# Patient Record
Sex: Female | Born: 1970
Health system: Southern US, Community
[De-identification: ages and names within clinical notes are randomized; demographics above are authoritative.]

## PROBLEM LIST (undated history)

## (undated) DIAGNOSIS — D126 Benign neoplasm of colon, unspecified: Secondary | ICD-10-CM

## (undated) DIAGNOSIS — B977 Papillomavirus as the cause of diseases classified elsewhere: Secondary | ICD-10-CM

## (undated) DIAGNOSIS — Z87442 Personal history of urinary calculi: Secondary | ICD-10-CM

## (undated) DIAGNOSIS — K589 Irritable bowel syndrome without diarrhea: Secondary | ICD-10-CM

## (undated) DIAGNOSIS — F419 Anxiety disorder, unspecified: Secondary | ICD-10-CM

## (undated) DIAGNOSIS — R7303 Prediabetes: Secondary | ICD-10-CM

## (undated) DIAGNOSIS — K219 Gastro-esophageal reflux disease without esophagitis: Secondary | ICD-10-CM

## (undated) DIAGNOSIS — Z91018 Allergy to other foods: Secondary | ICD-10-CM

## (undated) DIAGNOSIS — G4700 Insomnia, unspecified: Secondary | ICD-10-CM

## (undated) DIAGNOSIS — F32A Depression, unspecified: Secondary | ICD-10-CM

## (undated) DIAGNOSIS — E785 Hyperlipidemia, unspecified: Secondary | ICD-10-CM

## (undated) DIAGNOSIS — T7840XA Allergy, unspecified, initial encounter: Secondary | ICD-10-CM

## (undated) DIAGNOSIS — E669 Obesity, unspecified: Secondary | ICD-10-CM

## (undated) DIAGNOSIS — B379 Candidiasis, unspecified: Secondary | ICD-10-CM

## (undated) DIAGNOSIS — F329 Major depressive disorder, single episode, unspecified: Secondary | ICD-10-CM

## (undated) DIAGNOSIS — R609 Edema, unspecified: Secondary | ICD-10-CM

## (undated) DIAGNOSIS — Z8742 Personal history of other diseases of the female genital tract: Secondary | ICD-10-CM

## (undated) HISTORY — DX: Hyperlipidemia, unspecified: E78.5

## (undated) HISTORY — DX: Irritable bowel syndrome, unspecified: K58.9

## (undated) HISTORY — DX: Personal history of urinary calculi: Z87.442

## (undated) HISTORY — DX: Anxiety disorder, unspecified: F41.9

## (undated) HISTORY — DX: Papillomavirus as the cause of diseases classified elsewhere: B97.7

## (undated) HISTORY — DX: Prediabetes: R73.03

## (undated) HISTORY — DX: Candidiasis, unspecified: B37.9

## (undated) HISTORY — DX: Insomnia, unspecified: G47.00

## (undated) HISTORY — DX: Depression, unspecified: F32.A

## (undated) HISTORY — DX: Benign neoplasm of colon, unspecified: D12.6

## (undated) HISTORY — DX: Allergy, unspecified, initial encounter: T78.40XA

## (undated) HISTORY — DX: Gastro-esophageal reflux disease without esophagitis: K21.9

## (undated) HISTORY — DX: Obesity, unspecified: E66.9

## (undated) HISTORY — DX: Personal history of other diseases of the female genital tract: Z87.42

## (undated) HISTORY — DX: Major depressive disorder, single episode, unspecified: F32.9

---

## 1998-02-12 ENCOUNTER — Emergency Department (HOSPITAL_COMMUNITY): Admission: EM | Admit: 1998-02-12 | Discharge: 1998-02-12 | Payer: Self-pay | Admitting: Emergency Medicine

## 1998-02-12 ENCOUNTER — Inpatient Hospital Stay (HOSPITAL_COMMUNITY): Admission: AD | Admit: 1998-02-12 | Discharge: 1998-02-13 | Payer: Self-pay | Admitting: Internal Medicine

## 1999-09-20 ENCOUNTER — Other Ambulatory Visit: Admission: RE | Admit: 1999-09-20 | Discharge: 1999-09-20 | Payer: Self-pay | Admitting: Obstetrics and Gynecology

## 2000-01-01 ENCOUNTER — Emergency Department (HOSPITAL_COMMUNITY): Admission: EM | Admit: 2000-01-01 | Discharge: 2000-01-01 | Payer: Self-pay | Admitting: Emergency Medicine

## 2000-03-22 ENCOUNTER — Encounter: Payer: Self-pay | Admitting: Emergency Medicine

## 2000-03-22 ENCOUNTER — Emergency Department (HOSPITAL_COMMUNITY): Admission: EM | Admit: 2000-03-22 | Discharge: 2000-03-22 | Payer: Self-pay | Admitting: *Deleted

## 2000-11-06 ENCOUNTER — Other Ambulatory Visit: Admission: RE | Admit: 2000-11-06 | Discharge: 2000-11-06 | Payer: Self-pay | Admitting: Obstetrics and Gynecology

## 2002-02-04 ENCOUNTER — Other Ambulatory Visit: Admission: RE | Admit: 2002-02-04 | Discharge: 2002-02-04 | Payer: Self-pay | Admitting: Obstetrics and Gynecology

## 2002-06-20 ENCOUNTER — Inpatient Hospital Stay (HOSPITAL_COMMUNITY): Admission: AD | Admit: 2002-06-20 | Discharge: 2002-06-20 | Payer: Self-pay | Admitting: *Deleted

## 2002-07-21 ENCOUNTER — Other Ambulatory Visit: Admission: RE | Admit: 2002-07-21 | Discharge: 2002-07-21 | Payer: Self-pay | Admitting: Obstetrics and Gynecology

## 2002-09-25 ENCOUNTER — Inpatient Hospital Stay (HOSPITAL_COMMUNITY): Admission: AD | Admit: 2002-09-25 | Discharge: 2002-09-27 | Payer: Self-pay | Admitting: Obstetrics and Gynecology

## 2002-11-12 ENCOUNTER — Other Ambulatory Visit: Admission: RE | Admit: 2002-11-12 | Discharge: 2002-11-12 | Payer: Self-pay | Admitting: Obstetrics and Gynecology

## 2003-04-28 ENCOUNTER — Other Ambulatory Visit: Admission: RE | Admit: 2003-04-28 | Discharge: 2003-04-28 | Payer: Self-pay | Admitting: Obstetrics and Gynecology

## 2003-08-25 ENCOUNTER — Emergency Department (HOSPITAL_COMMUNITY): Admission: EM | Admit: 2003-08-25 | Discharge: 2003-08-25 | Payer: Self-pay | Admitting: Emergency Medicine

## 2003-10-27 ENCOUNTER — Other Ambulatory Visit: Admission: RE | Admit: 2003-10-27 | Discharge: 2003-10-27 | Payer: Self-pay | Admitting: Obstetrics and Gynecology

## 2004-07-18 ENCOUNTER — Encounter: Admission: RE | Admit: 2004-07-18 | Discharge: 2004-07-18 | Payer: Self-pay | Admitting: Obstetrics and Gynecology

## 2004-08-14 HISTORY — PX: REDUCTION MAMMAPLASTY: SUR839

## 2004-08-14 HISTORY — PX: UPPER GASTROINTESTINAL ENDOSCOPY: SHX188

## 2004-11-28 ENCOUNTER — Ambulatory Visit (HOSPITAL_COMMUNITY): Admission: RE | Admit: 2004-11-28 | Discharge: 2004-11-28 | Payer: Self-pay | Admitting: Internal Medicine

## 2004-11-29 ENCOUNTER — Ambulatory Visit (HOSPITAL_COMMUNITY): Admission: RE | Admit: 2004-11-29 | Discharge: 2004-11-29 | Payer: Self-pay | Admitting: Internal Medicine

## 2005-01-12 ENCOUNTER — Other Ambulatory Visit: Admission: RE | Admit: 2005-01-12 | Discharge: 2005-01-12 | Payer: Self-pay | Admitting: Obstetrics and Gynecology

## 2005-06-07 ENCOUNTER — Emergency Department (HOSPITAL_COMMUNITY): Admission: EM | Admit: 2005-06-07 | Discharge: 2005-06-07 | Payer: Self-pay | Admitting: Emergency Medicine

## 2005-08-14 HISTORY — PX: BREAST SURGERY: SHX581

## 2005-10-06 ENCOUNTER — Ambulatory Visit (HOSPITAL_COMMUNITY): Admission: RE | Admit: 2005-10-06 | Discharge: 2005-10-06 | Payer: Self-pay | Admitting: Pulmonary Disease

## 2005-11-03 ENCOUNTER — Emergency Department (HOSPITAL_COMMUNITY): Admission: EM | Admit: 2005-11-03 | Discharge: 2005-11-03 | Payer: Self-pay | Admitting: Emergency Medicine

## 2005-11-07 ENCOUNTER — Ambulatory Visit (HOSPITAL_COMMUNITY): Admission: RE | Admit: 2005-11-07 | Discharge: 2005-11-07 | Payer: Self-pay | Admitting: Pulmonary Disease

## 2007-08-15 HISTORY — PX: BREAST REDUCTION SURGERY: SHX8

## 2007-10-10 ENCOUNTER — Ambulatory Visit (HOSPITAL_BASED_OUTPATIENT_CLINIC_OR_DEPARTMENT_OTHER): Admission: RE | Admit: 2007-10-10 | Discharge: 2007-10-11 | Payer: Self-pay | Admitting: Plastic Surgery

## 2007-10-11 ENCOUNTER — Encounter (INDEPENDENT_AMBULATORY_CARE_PROVIDER_SITE_OTHER): Payer: Self-pay | Admitting: Plastic Surgery

## 2008-02-28 ENCOUNTER — Encounter: Payer: Self-pay | Admitting: Internal Medicine

## 2008-04-16 ENCOUNTER — Encounter: Payer: Self-pay | Admitting: Internal Medicine

## 2008-05-12 ENCOUNTER — Encounter: Payer: Self-pay | Admitting: Internal Medicine

## 2008-05-13 ENCOUNTER — Ambulatory Visit: Payer: Self-pay | Admitting: Internal Medicine

## 2008-05-13 DIAGNOSIS — R198 Other specified symptoms and signs involving the digestive system and abdomen: Secondary | ICD-10-CM | POA: Insufficient documentation

## 2008-05-13 DIAGNOSIS — K581 Irritable bowel syndrome with constipation: Secondary | ICD-10-CM | POA: Insufficient documentation

## 2008-05-13 DIAGNOSIS — K921 Melena: Secondary | ICD-10-CM

## 2008-05-13 DIAGNOSIS — K5900 Constipation, unspecified: Secondary | ICD-10-CM

## 2008-05-14 HISTORY — PX: COLONOSCOPY: SHX174

## 2008-05-18 ENCOUNTER — Encounter: Payer: Self-pay | Admitting: Internal Medicine

## 2008-05-21 ENCOUNTER — Encounter: Payer: Self-pay | Admitting: Internal Medicine

## 2008-05-21 ENCOUNTER — Ambulatory Visit: Payer: Self-pay | Admitting: Internal Medicine

## 2008-06-24 ENCOUNTER — Telehealth: Payer: Self-pay | Admitting: Internal Medicine

## 2008-07-13 ENCOUNTER — Telehealth: Payer: Self-pay | Admitting: Internal Medicine

## 2008-07-13 DIAGNOSIS — K219 Gastro-esophageal reflux disease without esophagitis: Secondary | ICD-10-CM | POA: Insufficient documentation

## 2008-07-13 DIAGNOSIS — R12 Heartburn: Secondary | ICD-10-CM

## 2008-07-14 HISTORY — PX: ESOPHAGOGASTRODUODENOSCOPY: SHX1529

## 2008-07-15 ENCOUNTER — Encounter: Payer: Self-pay | Admitting: Internal Medicine

## 2008-07-15 ENCOUNTER — Ambulatory Visit: Payer: Self-pay | Admitting: Internal Medicine

## 2009-06-27 ENCOUNTER — Emergency Department (HOSPITAL_COMMUNITY): Admission: EM | Admit: 2009-06-27 | Discharge: 2009-06-27 | Payer: Self-pay | Admitting: Emergency Medicine

## 2009-11-10 ENCOUNTER — Telehealth: Payer: Self-pay | Admitting: Internal Medicine

## 2009-12-02 ENCOUNTER — Ambulatory Visit (HOSPITAL_COMMUNITY): Admission: RE | Admit: 2009-12-02 | Discharge: 2009-12-02 | Payer: Self-pay | Admitting: Gastroenterology

## 2010-03-10 ENCOUNTER — Telehealth: Payer: Self-pay | Admitting: Internal Medicine

## 2010-06-03 ENCOUNTER — Telehealth: Payer: Self-pay | Admitting: Gastroenterology

## 2010-09-13 NOTE — Progress Notes (Signed)
Summary: Questions about med.  Phone Note Call from Patient Call back at [phone]   Caller: Patient Call For: Dr. Leone Payor Reason for Call: Refill Medication Summary of Call: Was taking 40 mg of Prilosec...last time her script was refilled it was refilled for 20mg . Wants to know if she can take 2 tablets to equal 40mg  Initial call taken by: Karna Christmas,  March 10, 2010 8:07 AM  Follow-up for Phone Call        I spoke to pt and she states her bottle states 20 mg.  This is not the first time this pharmacy has given her the wrong medication.  Advised pt to take 2 tablets daily and I will call pharmacy to correct rx. RX corrected with Thurston Pounds at [facility].  Pt to follow up with Dr. Leone Payor in 12/11. Follow-up by: Francee Piccolo CMA Duncan Dull),  March 10, 2010 8:49 AM

## 2010-09-13 NOTE — Progress Notes (Signed)
  Per [facility], patient given samples of Amitiza , #4 boxes.

## 2010-09-13 NOTE — Progress Notes (Signed)
Summary: TRIAGE-CONSTIPATION  Phone Note Call from Patient Call back at [phone]   Caller: Patient Call For: Dr. Leone Payor Reason for Call: Talk to Nurse Summary of Call: pt interested in trying Amitiza in lower dose Initial call taken by: Vallarie Mare,  November 10, 2009 10:10 AM  Follow-up for Phone Call        Pt. was tried on Amitizia by PCP last year, but it caused severe diarrhea so she stopped it.  Pt. is now having more frequent constipation and would like to try Amitizia at the lower dose. Denies fever, n/v, blood,black stools.   DR.Jodye Scali PLEASE ADVISE  Follow-up by: Laureen Ochs LPN,  November 10, 2009 10:26 AM  Additional Follow-up for Phone Call Additional follow up Details #1::        Not seen since 2009 not appropriate to Rx this over phone without a visit she could ask her PCP if seen recently by them or schedule REV Additional Follow-up by: Iva Boop MD, Clementeen Graham,  November 10, 2009 2:36 PM    Additional Follow-up for Phone Call Additional follow up Details #2::    Above MD orders reviewed with patient. She will discuss with her PCP. Pt. instructed to call back as needed.  Follow-up by: Laureen Ochs LPN,  November 10, 2009 3:15 PM

## 2010-12-27 NOTE — Op Note (Signed)
Theresa [name], Theresa [name]                  ACCOUNT NO.:  [id number]   MEDICAL RECORD NO.:  [id number]          PATIENT TYPE:  AMB   LOCATION:  DSC                          FACILITY:  [facility]   PHYSICIAN:  Consuello Bossier., M.D.DATE OF BIRTH:  [birth year]   DATE OF PROCEDURE:  10/10/2007  DATE OF DISCHARGE:                               OPERATIVE REPORT   PREOPERATIVE DIAGNOSIS:  Symptomatic bilateral mammary hypertrophy.   POSTOPERATIVE DIAGNOSIS:  Symptomatic bilateral mammary hypertrophy.   PROCEDURE:  Bilateral reduction mammoplasty.   SURGEON:  Pleas Patricia, M.D.   ASSISTANTOctavio Graves Cox, RNFA   ANESTHESIA:  General endotracheal.   FINDINGS:  The patient had symptomatic bilateral mammary hypertrophy  with discomfort in her chest, upper shoulder, and back area.  She had a  somewhat larger left breast as compared to the right.  Over 502 grams of  tissue were removed from the right breast, 553 grams of tissue were  removed from the larger left breast, and specimen sent to pathology.   DESCRIPTION OF PROCEDURE:  The patient was brought to the operating room  having been marked in the upright position for the planned surgical  procedure.  She was given a general endotracheal anesthetic, prepped  with Betadine, and draped about both breasts in a sterile fashion.  Initially the keyhole areas with inferior pedicle were deepithelialized.  Incision was made medially down to the pectoralis major muscle and then  continued upward along the medial aspect of the planned vertical  bipedicle nipple areolar graft.  The dissection was continued also  laterally similarly creating the vertical bipedicle nipple areolar  graft.  At this point an incision was made in the pedicle at the level  of the nipple and then leaving a 1 cm in depth superior pedicle.  This  central aspect was dissected down to the pectoralis major muscle.  A  large triangular segment of medial full thickness breast tissue  was then  removed in continuity with the central segment as well as with an even  larger lateral segment.  The above differential amounts of breast tissue  were removed.  The wound was irrigated with normal saline, hemostasis  achieved, and there was noted to be good hemostasis.  At this point the  medial and lateral flaps were brought together to a predetermined  position along the inframammary line with an interrupted #2-0 Vicryl.  The nipple was then inset with interrupted #3-0 Monocryl and the  circumareolar, vertical, and inframammary incisions further closed with  interrupted subcutaneous #3-0 Monocryl and then the same incisions were  further approximated with a running subcuticular #4-0 Monocryl.  The  nipple color was excellent and the breasts appeared to be symmetrical.  Steri-Strips, Xeroform,  fluffs, ABD, and a circumferential Ace bandage were applied.  The  patient tolerated the procedure well and was able to be discharged from  the operating room to the recovery room and subsequently to be admitted  in the [facility] for overnight observation.      Consuello Bossier., M.D.  Electronically Signed     HH/MEDQ  D:  10/10/2007  T:  10/11/2007  Job:  [id number]

## 2011-02-09 ENCOUNTER — Ambulatory Visit (INDEPENDENT_AMBULATORY_CARE_PROVIDER_SITE_OTHER): Payer: Self-pay | Admitting: Internal Medicine

## 2011-05-08 LAB — POCT HEMOGLOBIN-HEMACUE: Hemoglobin: 13.3

## 2011-10-30 ENCOUNTER — Ambulatory Visit: Payer: Self-pay | Admitting: Gastroenterology

## 2011-10-31 ENCOUNTER — Ambulatory Visit (INDEPENDENT_AMBULATORY_CARE_PROVIDER_SITE_OTHER): Payer: Self-pay | Admitting: Gastroenterology

## 2011-10-31 ENCOUNTER — Encounter: Payer: Self-pay | Admitting: Gastroenterology

## 2011-10-31 VITALS — BP 120/78 | HR 87 | Temp 97.6°F | Ht 66.0 in | Wt 184.6 lb

## 2011-10-31 DIAGNOSIS — K219 Gastro-esophageal reflux disease without esophagitis: Secondary | ICD-10-CM

## 2011-10-31 DIAGNOSIS — K5900 Constipation, unspecified: Secondary | ICD-10-CM

## 2011-10-31 MED ORDER — PANTOPRAZOLE SODIUM 40 MG PO TBEC: 40.0000 mg | DELAYED_RELEASE_TABLET | Freq: Every day | ORAL | Status: DC

## 2011-10-31 MED ORDER — LINACLOTIDE 145 MCG PO CAPS: 145.0000 | ORAL_CAPSULE | ORAL | Status: DC

## 2011-10-31 NOTE — Patient Instructions (Signed)
Please have blood work done, and we will call you with the results. If further blood work is needed, we will proceed in that direction.  For now, stop Prilosec. Start Protonix daily, 30 minutes before the first meal of the day. We will give this about a month to see if it helps with your symptoms. If not, we can try a different medication.   Stop Amitiza for now as well. Begin the samples of Linzess, one each morning, 30 minutes before the first meal of the day. If you have diarrhea, contact our office. If this medication helps, let us know so we can call it into your preferred pharmacy.   Finally, we will see you back in 8 weeks. If you are doing well and wish to postpone this to 3 months, just call our office.  Continue to avoid the reflux triggers as we talked about.

## 2011-10-31 NOTE — Assessment & Plan Note (Signed)
41 year old female with significant GERD in light of 40 lbs wt gain over past year or so after divorce. On Prilosec BID without much relief. She admits to drinking a pot of coffee daily and moderate intake of alcohol; she has actually cut down on drinking alcohol. She is well aware of dietary measures and wt gain likely the culprit of her worsening symptoms. She has no signs of GI bleeding. Intermittent nausea in the afternoon may be multifactorial. Question uncontrolled GERD, dietary behaviors, possible underlying constipation compounding symptoms. [place] abd April 2011 normal. EGD in 2009 on file.   Will switch from Prilosec BID to Protonix daily. Trial this for 1 month. If no improvement, change to Dexilant or Aciphex. I did offer an updated EGD, although I do not believe this is entirely necessary at this point. We discussed dietary modification and significant limiting of ETOH, with reassessment in 8 weeks.   As of note, she states her Hgb was 10.1 via fingerstick at her GYN recently. Notes in Jan was 13 per her report. This may have been inaccurate, but we will obtain a CBC with diff to assess for any new onset anemia. Obviously, this will change the course of our plan if she is anemic. She states understanding.   Return in 6 weeks.

## 2011-10-31 NOTE — Progress Notes (Signed)
Faxed to PCP

## 2011-10-31 NOTE — Progress Notes (Signed)
Primary Care Physician:  Vivia Ewing, MD, MD Primary Gastroenterologist:  Dr. Jena Gauss   Chief Complaint  Patient presents with  . Nausea  . 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 Prior to this, she worked in [employer]. She is self-referred today due to recent fingerstick at GYN with Hgb of 10.1. She believes this is inaccurate, as in Jan she notes it was 13. I do not have these results currently. However, with her hx, she wanted to be seen and evaluated. Notes hx of chronic constipation, s/p colonoscopy in 2009 as listed in [facility] by Dr. Leone Payor. Takes Amitiza 24 mcg BID, but she states this is not working very well anymore. At times will have to take Dulcolax for relief; she states Miralax does nothing. Denies any rectal bleeding. Also notes "reflux out of control". She is honest admitting that she has gained about 40 lbs over the past year after going through a divorce. States she started drinking for fun, not depression. She has cut back on this some, but she states she knows she needs to be stricter with food and drink intake. She also drinks about a pot of coffee each morning. EGD done in 2009 as well with benign gastric polyp. She is currently taking Prilosec 40 mg BID without any significant relief. She has not tried any other PPIs. She would like to try Protonix. Reports nausea daily, at least 1-2 X per day. Ginger ale and crackers help. Notices frequently in afternoon, about 2 hours after lunch. Denies any abdominal pain with eating, more bloating sensation. No melena.   [place] abd April 2011: normal No prior HIDA   Past Medical History  Diagnosis Date  . IBS (irritable bowel syndrome)     constipation  . GERD (gastroesophageal reflux disease)     Past Surgical History  Procedure Date  . Breast reduction surgery 2009  . Esophagogastroduodenoscopy Dec 2009    Dr. Leone Payor:  reflux esophagitis, proximal gastric polyps, benign  . Colonoscopy Oct 2009    Dr. Leone Payor: int/ext hemorrhoids, ?mild proctitis but path benign, likely r/t irritation from bowel prep    Current Outpatient Prescriptions  Medication Sig Dispense Refill  . ALPRAZolam (XANAX) 0.5 MG tablet Take 0.5 mg by mouth at bedtime as needed.      . citalopram (CELEXA) 20 MG tablet Take 20 mg by mouth daily.      . Linaclotide (LINZESS) 145 MCG CAPS Take 145 capsules by mouth 1 day or 1 dose. 30 minutes before breakfast.  30 capsule  0  . pantoprazole (PROTONIX) 40 MG tablet Take 1 tablet (40 mg total) by mouth daily. 30 minutes before breakfast.  30 tablet  0    Allergies as of 10/31/2011  . (No Known Allergies)    Family History  Problem Relation Age of Onset  . Colon cancer Neg Hx     History   Social History  . Marital Status: Married    Spouse Name: N/A    Number of Children: 1  . Years of Education: N/A   Occupational History  . RN [employer]   Social History Main Topics  . Smoking status: Never Smoker   . Smokeless tobacco: Not on file  . Alcohol Use: Yes     2 a week; in past year has had excessive drinking but cut back. Drinking not secondary to  depression; went through a divorce and drank socially  . Drug Use: No  . Sexually Active: Not on file   Other Topics Concern  . Not on file   Social History Narrative  . No narrative on file    Review of Systems: Gen: Denies any fever, chills, fatigue, weight loss, lack of appetite.  CV: Denies chest pain, heart palpitations, peripheral edema, syncope.  Resp: Denies shortness of breath at rest or with exertion. Denies wheezing or cough.  GI: Denies dysphagia or odynophagia. Denies jaundice, hematemesis, fecal incontinence. GU : Denies urinary burning, urinary frequency, urinary hesitancy MS: Denies joint pain, muscle weakness, cramps, or limitation of movement.  Derm: Denies rash, itching, dry skin Psych: Denies depression,  anxiety, memory loss, and confusion Heme: Denies bruising, bleeding, and enlarged lymph nodes.  Physical Exam: BP 120/78  Pulse 87  Temp(Src) 97.6 F (36.4 C) (Temporal)  Ht 5\' 6"  (1.676 m)  Wt 184 lb 9.6 oz (83.734 kg)  BMI 29.80 kg/m2 General:   Alert and oriented. Pleasant and cooperative. Well-nourished and well-developed.  Head:  Normocephalic and atraumatic. Eyes:  Without icterus, sclera clear and conjunctiva pink.  Ears:  Normal auditory acuity. Nose:  No deformity, discharge,  or lesions. Mouth:  No deformity or lesions, oral mucosa pink.  Neck:  Supple, without mass or thyromegaly. Lungs:  Clear to auscultation bilaterally. No wheezes, rales, or rhonchi. No distress.  Heart:  S1, S2 present without murmurs appreciated.  Abdomen:  +BS, soft, non-tender and non-distended. No HSM noted. No guarding or rebound. No masses appreciated.  Rectal:  Deferred  Msk:  Symmetrical without gross deformities. Normal posture. Extremities:  Without clubbing or edema. Neurologic:  Alert and  oriented x4;  grossly normal neurologically. Skin:  Intact without significant lesions or rashes. Cervical Nodes:  No significant cervical adenopathy. Psych:  Alert and cooperative. Normal mood and affect.

## 2011-10-31 NOTE — Assessment & Plan Note (Signed)
Chronic in nature. Amitiza 24 mcg BID not working as well. TCS in 2009 on file. No rectal bleeding. Due to intermittent nausea, will switch to Linzess, trial 145 mcg daily. A 1 month supply was provided, and she will call us if improvement so we may send full prescription.   Discussed high fiber diet as well.  Return in 6 weeks for evaluation.

## 2011-11-01 LAB — CBC WITH DIFFERENTIAL/PLATELET
Basophils Absolute: 0 10*3/uL (ref 0.0–0.1)
Basophils Relative: 0 % (ref 0–1)
Eosinophils Absolute: 0.8 10*3/uL — ABNORMAL HIGH (ref 0.0–0.7)
Eosinophils Relative: 8 % — ABNORMAL HIGH (ref 0–5)
Hemoglobin: 13 g/dL (ref 12.0–15.0)
Monocytes Absolute: 0.6 10*3/uL (ref 0.1–1.0)
Monocytes Relative: 6 % (ref 3–12)
RBC: 4.42 MIL/uL (ref 3.87–5.11)

## 2011-11-01 NOTE — Progress Notes (Signed)
Quick Note:  Reviewed results with pt, no anemia noted. Likely fingerstick Hgb not accurate in past. Pt reports chronic hx of elevated eosinophils. +hx of allergies. Will see her back as noted unless she improves with constipation regimen and PPI. She may postpone to about 3 mos if necessary. ______

## 2011-11-15 ENCOUNTER — Other Ambulatory Visit: Payer: Self-pay | Admitting: Gastroenterology

## 2011-11-15 MED ORDER — DEXLANSOPRAZOLE 60 MG PO CPDR: 60.0000 mg | DELAYED_RELEASE_CAPSULE | Freq: Every day | ORAL | Status: DC

## 2011-11-15 NOTE — Progress Notes (Signed)
Protonix not helping reflux.  Will trial Dexilant as she has failed 2 preferred PPIs. (Prilosec BID, Protonix).   She has instituted behavior modification to include decreased caffeine intake.

## 2011-11-16 ENCOUNTER — Other Ambulatory Visit: Payer: Self-pay | Admitting: Gastroenterology

## 2011-11-16 DIAGNOSIS — R110 Nausea: Secondary | ICD-10-CM

## 2011-11-16 MED ORDER — DEXLANSOPRAZOLE 60 MG PO CPDR: 60.0000 mg | DELAYED_RELEASE_CAPSULE | Freq: Every day | ORAL | Status: DC

## 2011-11-16 NOTE — Progress Notes (Signed)
HIDA scheduled for 04/08- pt came by and was informed

## 2011-11-16 NOTE — Progress Notes (Signed)
Pt called in with nausea X past 3 days, occurring hour or so after eating, feels dizzy. No issues with abdominal pain. [place] of abd on file from 2011 no gallstones. No prior HIDA. No prior LFTs.   Worried about gallbladder. As she has not had HIDA, will proceed with this in very near future. Add LFTs today. Picking up Dexilant samples today as well.    Let's set pt up for HIDA. Order already placed.   Pt picking up labs today with Dexilant sample.

## 2011-11-17 ENCOUNTER — Other Ambulatory Visit: Payer: Self-pay | Admitting: Gastroenterology

## 2011-11-17 LAB — HEPATIC FUNCTION PANEL
AST: 16 U/L (ref 0–37)
Alkaline Phosphatase: 70 U/L (ref 39–117)
Indirect Bilirubin: 0.4 mg/dL (ref 0.0–0.9)

## 2011-11-20 ENCOUNTER — Telehealth: Payer: Self-pay | Admitting: Gastroenterology

## 2011-11-20 ENCOUNTER — Other Ambulatory Visit (HOSPITAL_COMMUNITY): Payer: Self-pay

## 2011-11-20 NOTE — Telephone Encounter (Signed)
Pt stated doing excellent with Dexilant. Let's cancel HIDA for now. Likely GERD r/t dietary factors, need for change in PPI, etc.  LFTs normal.

## 2011-11-23 NOTE — Progress Notes (Signed)
Quick Note:  Discussed with pt. Normal HFP. Continue with current plan of Dexilant. Return in several months. ______

## 2011-12-13 ENCOUNTER — Encounter: Payer: Self-pay | Admitting: Internal Medicine

## 2012-01-02 ENCOUNTER — Other Ambulatory Visit: Payer: Self-pay

## 2012-01-02 MED ORDER — POLYETHYLENE GLYCOL 3350 17 GM/SCOOP PO POWD: 17.0000 g | Freq: Two times a day (BID) | ORAL | Status: AC

## 2012-04-04 ENCOUNTER — Other Ambulatory Visit: Payer: Self-pay | Admitting: Gastroenterology

## 2012-04-04 MED ORDER — ESOMEPRAZOLE MAGNESIUM 40 MG PO CPDR: 40.0000 mg | DELAYED_RELEASE_CAPSULE | Freq: Two times a day (BID) | ORAL | Status: DC

## 2012-04-04 NOTE — Progress Notes (Signed)
Dexilant expensive. Will switch to nexium BID.

## 2012-04-30 ENCOUNTER — Encounter: Payer: Self-pay | Admitting: Internal Medicine

## 2012-09-16 ENCOUNTER — Other Ambulatory Visit: Payer: Self-pay | Admitting: Gastroenterology

## 2012-09-16 MED ORDER — PANTOPRAZOLE SODIUM 40 MG PO TBEC: 40.0000 mg | DELAYED_RELEASE_TABLET | Freq: Every day | ORAL | Status: DC

## 2012-09-16 NOTE — Progress Notes (Signed)
Preferred PPI per insurance changed from Prilosec to Protonix. Per pt request, sent prescription for Protonix daily, 90 day supply

## 2012-10-09 ENCOUNTER — Other Ambulatory Visit: Payer: Self-pay | Admitting: Gastroenterology

## 2012-10-09 MED ORDER — OMEPRAZOLE 20 MG PO CPDR: 20.0000 mg | DELAYED_RELEASE_CAPSULE | Freq: Every day | ORAL | Status: DC

## 2012-10-09 MED ORDER — ESOMEPRAZOLE MAGNESIUM 40 MG PO CPDR: 40.0000 mg | DELAYED_RELEASE_CAPSULE | Freq: Every day | ORAL | Status: DC

## 2012-10-09 NOTE — Progress Notes (Signed)
Correction: Change to Nexium, not Prilosec.

## 2012-10-09 NOTE — Progress Notes (Signed)
Pt was placed on Protonix a few weeks ago after insurance changes allowed zero copay for this brand. However, this is not effectively treating her GERD symptoms. She has requested to return to Prilosec. Prescription sent.

## 2012-10-09 NOTE — Addendum Note (Signed)
Addended by: Nira Retort on: 10/09/2012 04:37 PM   Modules accepted: Orders

## 2012-11-04 ENCOUNTER — Other Ambulatory Visit: Payer: Self-pay | Admitting: Family Medicine

## 2012-11-05 ENCOUNTER — Other Ambulatory Visit: Payer: Self-pay | Admitting: *Deleted

## 2012-11-05 MED ORDER — CITALOPRAM HYDROBROMIDE 20 MG PO TABS: 20.0000 mg | ORAL_TABLET | Freq: Every day | ORAL | Status: DC

## 2012-11-05 MED ORDER — ZOLPIDEM TARTRATE 10 MG PO TABS: 10.0000 mg | ORAL_TABLET | Freq: Every evening | ORAL | Status: DC | PRN

## 2012-11-06 ENCOUNTER — Other Ambulatory Visit: Payer: Self-pay | Admitting: Family Medicine

## 2012-11-07 ENCOUNTER — Other Ambulatory Visit: Payer: Self-pay | Admitting: *Deleted

## 2012-11-07 MED ORDER — CITALOPRAM HYDROBROMIDE 40 MG PO TABS: 40.0000 mg | ORAL_TABLET | Freq: Every day | ORAL | Status: DC

## 2012-12-09 ENCOUNTER — Other Ambulatory Visit: Payer: Self-pay | Admitting: Family Medicine

## 2012-12-10 ENCOUNTER — Encounter: Payer: Self-pay | Admitting: Family Medicine

## 2012-12-10 ENCOUNTER — Other Ambulatory Visit: Payer: Self-pay | Admitting: Family Medicine

## 2012-12-10 MED ORDER — CITALOPRAM HYDROBROMIDE 40 MG PO TABS: 40.0000 mg | ORAL_TABLET | Freq: Every day | ORAL | Status: DC

## 2012-12-11 ENCOUNTER — Telehealth: Payer: Self-pay

## 2012-12-11 NOTE — Telephone Encounter (Signed)
See patient refill request- patient provided clarification.

## 2012-12-11 NOTE — Telephone Encounter (Signed)
Ok let's do. Needs q 6 mo visits

## 2012-12-12 ENCOUNTER — Other Ambulatory Visit: Payer: Self-pay

## 2012-12-16 ENCOUNTER — Encounter: Payer: Self-pay | Admitting: *Deleted

## 2012-12-17 ENCOUNTER — Ambulatory Visit: Payer: Self-pay | Admitting: Family Medicine

## 2012-12-25 ENCOUNTER — Encounter: Payer: Self-pay | Admitting: Family Medicine

## 2012-12-25 ENCOUNTER — Ambulatory Visit (INDEPENDENT_AMBULATORY_CARE_PROVIDER_SITE_OTHER): Payer: Self-pay | Admitting: Family Medicine

## 2012-12-25 VITALS — BP 128/82 | Wt 186.4 lb

## 2012-12-25 DIAGNOSIS — F411 Generalized anxiety disorder: Secondary | ICD-10-CM | POA: Insufficient documentation

## 2012-12-25 DIAGNOSIS — K219 Gastro-esophageal reflux disease without esophagitis: Secondary | ICD-10-CM

## 2012-12-25 NOTE — Progress Notes (Signed)
  Subjective:    Patient ID: Theresa [name], female    DOB: 1971-03-12, 42 y.o.   MRN: [id number]  Anxiety Presents for follow-up visit.     Takes the xan zqam and twice per wk or so for sleep. Wide open with schedule. Changing jobs to hospice. occas tend  Reflux stable while on the meds . Doesn't miss. Takes qam. Review of Systemsno sig depresiion Nausea no change in bowel habits.    Objective:   Physical Exam Alert no acute distress. Lungs clear. Heart regular in rhythm. Abdomen benign.       Assessment & Plan:  Impression 1 reflux clinically stable. #2 chronic anxiety stable. Patient requires to Xanax as per day. #3 depression stable on Celexa. Plan diet exercise discussed. Calcium supplement encourage. Recheck her persists. WSL

## 2013-01-20 ENCOUNTER — Other Ambulatory Visit: Payer: Self-pay | Admitting: Family Medicine

## 2013-01-20 NOTE — Telephone Encounter (Signed)
Ok with five ref may ref daily

## 2013-01-20 NOTE — Telephone Encounter (Signed)
Last office visit 12/25/2012.

## 2013-02-20 MED ORDER — CITALOPRAM HYDROBROMIDE 40 MG PO TABS: 40.0000 mg | ORAL_TABLET | Freq: Every day | ORAL | Status: DC

## 2013-03-26 ENCOUNTER — Encounter: Payer: Self-pay | Admitting: Family Medicine

## 2013-04-22 ENCOUNTER — Ambulatory Visit: Payer: Self-pay | Admitting: Family Medicine

## 2013-06-19 ENCOUNTER — Other Ambulatory Visit: Payer: Self-pay

## 2013-07-07 ENCOUNTER — Other Ambulatory Visit: Payer: Self-pay | Admitting: Gastroenterology

## 2013-07-07 MED ORDER — HYDROCORTISONE ACETATE 25 MG RE SUPP: 25.0000 mg | Freq: Two times a day (BID) | RECTAL | Status: DC

## 2013-07-07 NOTE — Progress Notes (Signed)
Patient called reporting swelling in anal and perineum area, concern for hemorrhoids. Mucus-like discharge at times. Using essential oil salve, which helps somewhat but not entirely improved.  Last TCS in 2009. I have sent Anusol suppositories to pharmacy.   Darl Pikes, please put patient in to see me on Dec 3rd at 3:30. Thanks!

## 2013-07-07 NOTE — Progress Notes (Signed)
OV made °

## 2013-07-14 DIAGNOSIS — D126 Benign neoplasm of colon, unspecified: Secondary | ICD-10-CM

## 2013-07-14 HISTORY — DX: Benign neoplasm of colon, unspecified: D12.6

## 2013-07-16 ENCOUNTER — Encounter: Payer: Self-pay | Admitting: Gastroenterology

## 2013-07-16 ENCOUNTER — Ambulatory Visit (INDEPENDENT_AMBULATORY_CARE_PROVIDER_SITE_OTHER): Payer: 59 | Admitting: Gastroenterology

## 2013-07-16 VITALS — BP 124/81 | HR 72 | Temp 98.2°F | Ht 66.0 in | Wt 181.0 lb

## 2013-07-16 DIAGNOSIS — K921 Melena: Secondary | ICD-10-CM

## 2013-07-16 DIAGNOSIS — K219 Gastro-esophageal reflux disease without esophagitis: Secondary | ICD-10-CM

## 2013-07-16 MED ORDER — DEXLANSOPRAZOLE 60 MG PO CPDR: 60.0000 mg | DELAYED_RELEASE_CAPSULE | Freq: Every day | ORAL | Status: DC

## 2013-07-16 NOTE — Progress Notes (Signed)
Referring Provider: Merlyn Albert, MD Primary Care Physician:  Harlow Asa, MD Primary GI: Requests Dr. Darrick Penna. No procedures performed by [facility], patient only seen in office.   Chief Complaint  Patient presents with  . Anal swelling  . Gastrophageal Reflux    HPI:   Arkie presents today as an urgent work-in secondary to change in bowel habits. History of chronic constipation and GERD. Last colonoscopy by Dr. Leone Payor in 2009 with question of mild proctitis but benign path.   Nexium prescription is 60$/month. Nexium OTC doesn't work. Nausea every day. Dexilant worked well. No improvement with Protonix, Prilosec. Salads make reflux worse. Feels salad stuck in esophagus for about a day after eating. Perineum swelling intermittently, specifically with straining. Every few weeks, lasts about 3 days. Depends on how much she has to strain. Not taking Miralax anymore because it caused loose stools. Drinks a lot of water. For most part bowel habits are regular. Occasional straining. Diarrhea with Amitiza. Linzess didn't help as much. Occasional low-volume hematochezia, rare. Mucus discharge more frequent than normal.    Past Medical History  Diagnosis Date  . IBS (irritable bowel syndrome)     constipation  . GERD (gastroesophageal reflux disease)   . Hyperlipidemia   . Chronic kidney disease     kidney stones  . Insomnia   . Anxiety   . Depression   . HPV (human papilloma virus) infection     Past Surgical History  Procedure Laterality Date  . Breast reduction surgery  2009  . Esophagogastroduodenoscopy  Dec 2009    Dr. Leone Payor: reflux esophagitis, proximal gastric polyps, benign  . Colonoscopy  Oct 2009    Dr. Leone Payor: int/ext hemorrhoids, ?mild proctitis but path benign, likely r/t irritation from bowel prep  . Breast surgery  2007    breast reduction    Current Outpatient Prescriptions  Medication Sig Dispense Refill  . ALPRAZolam (XANAX) 1 MG tablet TAKE 1 TABLET BY MOUTH 12  HOURS AS NEEDED FOR ANXIETY.  60 tablet  5  . citalopram (CELEXA) 40 MG tablet Take 1 tablet (40 mg total) by mouth daily.  90 tablet  0  . medroxyPROGESTERone (PROVERA) 10 MG tablet Take 10 mg by mouth daily. First 10 days of the month each month      . dexlansoprazole (DEXILANT) 60 MG capsule Take 1 capsule (60 mg total) by mouth daily.  90 capsule  3  . hydrocortisone (ANUSOL-HC) 25 MG suppository Place 1 suppository (25 mg total) rectally every 12 (twelve) hours.  12 suppository  1   No current facility-administered medications for this visit.    Allergies as of 07/16/2013  . (No Known Allergies)    Family History  Problem Relation Age of Onset  . Colon cancer Neg Hx   . Cancer Mother   . Hypertension Father   . Hyperlipidemia Father     History   Social History  . Marital Status: Married    Spouse Name: N/A    Number of Children: 1  . Years of Education: N/A   Occupational History  . RN [employer]   Social History Main Topics  . Smoking status: Never Smoker   . Smokeless tobacco: None     Comment: Never smoker  . Alcohol Use: Yes     Comment: socially  . Drug Use: No  . Sexual Activity: None   Other Topics Concern  . None   Social History Narrative  . None    Review of  Systems: As mentioned in HPI.   Physical Exam: BP 124/81  Pulse 72  Temp(Src) 98.2 F (36.8 C) (Oral)  Ht 5\' 6"  (1.676 m)  Wt 181 lb (82.101 kg)  BMI 29.23 kg/m2 General:   Alert and oriented. No distress noted. Pleasant and cooperative.  Head:  Normocephalic and atraumatic. Eyes:  Conjuctiva clear without scleral icterus. Mouth:  Oral mucosa pink and moist. Good dentition. No lesions. Heart:  S1, S2 present without murmurs, rubs, or gallops. Regular rate and rhythm. Abdomen:  +BS, soft, non-tender and non-distended. No rebound or guarding. No HSM or masses noted. Rectal: small, non-thrombosed external hemorrhoid located at [employer]. Internal exam without mass, stricture  appreciated.  Msk:  Symmetrical without gross deformities. Normal posture. Extremities:  Without edema. Neurologic:  Alert and  oriented x4;  grossly normal neurologically. Skin:  Intact without significant lesions or rashes. Psych:  Alert and cooperative. Normal mood and affect.

## 2013-07-16 NOTE — Patient Instructions (Signed)
I have provided samples of Dexilant to start taking and a savings card. I sent the prescription to your pharmacy.  I will be talking with Dr. Darrick Penna about the need for a procedure prior to hemorrhoid banding. We will be in touch shortly!  Hemorrhoids Hemorrhoids are swollen veins around the rectum or anus. There are two types of hemorrhoids:   Internal hemorrhoids. These occur in the veins just inside the rectum. They may poke through to the outside and become irritated and painful.  External hemorrhoids. These occur in the veins outside the anus and can be felt as a painful swelling or hard lump near the anus. CAUSES  Pregnancy.   Obesity.   Constipation or diarrhea.   Straining to have a bowel movement.   Sitting for long periods on the toilet.  Heavy lifting or other activity that caused you to strain.  Anal intercourse. SYMPTOMS   Pain.   Anal itching or irritation.   Rectal bleeding.   Fecal leakage.   Anal swelling.   One or more lumps around the anus.  DIAGNOSIS  Your caregiver may be able to diagnose hemorrhoids by visual examination. Other examinations or tests that may be performed include:   Examination of the rectal area with a gloved hand (digital rectal exam).   Examination of anal canal using a small tube (scope).   A blood test if you have lost a significant amount of blood.  A test to look inside the colon (sigmoidoscopy or colonoscopy). TREATMENT Most hemorrhoids can be treated at home. However, if symptoms do not seem to be getting better or if you have a lot of rectal bleeding, your caregiver may perform a procedure to help make the hemorrhoids get smaller or remove them completely. Possible treatments include:   Placing a rubber band at the base of the hemorrhoid to cut off the circulation (rubber band ligation).   Injecting a chemical to shrink the hemorrhoid (sclerotherapy).   Using a tool to burn the hemorrhoid (infrared  light therapy).   Surgically removing the hemorrhoid (hemorrhoidectomy).   Stapling the hemorrhoid to block blood flow to the tissue (hemorrhoid stapling).  HOME CARE INSTRUCTIONS   Eat foods with fiber, such as whole grains, beans, nuts, fruits, and vegetables. Ask your doctor about taking products with added fiber in them (fibersupplements).  Increase fluid intake. Drink enough water and fluids to keep your urine clear or pale yellow.   Exercise regularly.   Go to the bathroom when you have the urge to have a bowel movement. Do not wait.   Avoid straining to have bowel movements.   Keep the anal area dry and clean. Use wet toilet paper or moist towelettes after a bowel movement.   Medicated creams and suppositories may be used or applied as directed.   Only take over-the-counter or prescription medicines as directed by your caregiver.   Take warm sitz baths for 15 20 minutes, 3 4 times a day to ease pain and discomfort.   Place ice packs on the hemorrhoids if they are tender and swollen. Using ice packs between sitz baths may be helpful.   Put ice in a plastic bag.   Place a towel between your skin and the bag.   Leave the ice on for 15 20 minutes, 3 4 times a day.   Do not use a donut-shaped pillow or sit on the toilet for long periods. This increases blood pooling and pain.  SEEK MEDICAL CARE IF:  You have  increasing pain and swelling that is not controlled by treatment or medicine.  You have uncontrolled bleeding.  You have difficulty or you are unable to have a bowel movement.  You have pain or inflammation outside the area of the hemorrhoids. MAKE SURE YOU:  Understand these instructions.  Will watch your condition.  Will get help right away if you are not doing well or get worse. Document Released: 07/28/2000 Document Revised: 07/17/2012 Document Reviewed: 06/04/2012 [facility] Patient Information 2014 [place], [place].

## 2013-07-20 ENCOUNTER — Encounter: Payer: Self-pay | Admitting: Gastroenterology

## 2013-07-20 NOTE — Assessment & Plan Note (Signed)
Low-volume hematochezia, with last colonoscopy in 2009 by Dr. Leone Payor and known internal hemorrhoids. External exam with obvious non-thrombosed external hemorrhoid. Would benefit from hemorrhoid banding in the future, but will likely need flex sig versus complete colonoscopy by Dr. Darrick Penna prior to outpatient procedure. I discussed this with Ms. Shanker, who states understanding. Will discuss with Dr. Darrick Penna what procedure is needed prior to banding, or if banding may be done without invasive procedure. Anusol suppositories provided in the interim.

## 2013-07-20 NOTE — Assessment & Plan Note (Signed)
Has done well with Dexilant in past. Restart Dexilant. Continue behavior modification.

## 2013-07-21 ENCOUNTER — Other Ambulatory Visit: Payer: Self-pay | Admitting: Gastroenterology

## 2013-07-21 ENCOUNTER — Encounter (HOSPITAL_COMMUNITY): Payer: Self-pay

## 2013-07-21 ENCOUNTER — Ambulatory Visit: Payer: Self-pay | Admitting: Family Medicine

## 2013-07-21 DIAGNOSIS — K6289 Other specified diseases of anus and rectum: Secondary | ICD-10-CM

## 2013-07-21 NOTE — Progress Notes (Signed)
cc'd to pcp 

## 2013-07-21 NOTE — Progress Notes (Signed)
Patient is scheduled w/SLF on Friday Dec 19th at 12:00 an I have mailed her the instructions and she is aware

## 2013-07-21 NOTE — Progress Notes (Signed)
Theresa [name]: Per Dr. Darrick Penna' recommendations, let's set up a colonoscopy with possible internal hemorrhoid banding at time of colonoscopy. Will need biopsies for microscopic colitis per Dr. Darrick Penna. Patient aware we will be calling her.

## 2013-07-21 NOTE — Progress Notes (Signed)
REVIEWED.  TCS W/ ? IH BANDING/BIOPSIES FOR MICROSCOPIC COLITIS

## 2013-07-29 ENCOUNTER — Ambulatory Visit (INDEPENDENT_AMBULATORY_CARE_PROVIDER_SITE_OTHER): Payer: 59 | Admitting: Advanced Practice Midwife

## 2013-07-29 ENCOUNTER — Encounter: Payer: Self-pay | Admitting: Advanced Practice Midwife

## 2013-07-29 VITALS — BP 112/76 | Ht 66.0 in | Wt 178.0 lb

## 2013-07-29 DIAGNOSIS — Z30430 Encounter for insertion of intrauterine contraceptive device: Secondary | ICD-10-CM

## 2013-07-29 DIAGNOSIS — Z3202 Encounter for pregnancy test, result negative: Secondary | ICD-10-CM

## 2013-07-29 NOTE — Progress Notes (Signed)
Theresa [name] is a 42 y.o. year old Caucasian female Gravida 1 Para 1  who presents for placement of a Mirena IUD. She has had a mirena for 11 years, and had it removed earlier this year when her husband got a vasectomy.  She started Depo then, and stopped after 2 shots.  She remained amenorrheic for several months, and her previous MD gave her Provera to take 10 days q month.  She has done this the last 3 months (no withdrawal bleeds).    [name] states that she wants her IUD put back in because of mood changes that she developed after it was removed.  It was discussed that the mood changes could be due to the depo, not the lack of Mirena, since it is still probably active since she has not resumed her periods.  Because of the added benefit of amenorrhea with Mirena, she elects to put it in anyway.  Her pregnancy test today is negative.  The risks and benefits of the method and placement have been thouroughly reviewed with the patient and all questions were answered.  Specifically the patient is aware of failure rate of 08/998, expulsion of the IUD and of possible perforation.  The patient is aware of irregular bleeding due to the method and understands the incidence of irregular bleeding diminishes with time.  Time out was performed.  A Graves speculum was placed.  The cervix was prepped using Betadine. The uterus was found to be neutral and it sounded to 7 cm.  The cervix was grasped with a tenaculum and the IUD was inserted to 7 cm.  It was pulled back 1 cm and the IUD was disengaged.  The strings were trimmed to 3 cm.  Sonogram was performed and the proper placement of the IUD was verified.  The patient was instructed on signs and symptoms of infection and to check for the strings after each menses or each month.  The patient is to refrain from intercourse for 3 days.  The patient is scheduled for a return appointment after her first menses or 4  weeks.  CRESENZO-DISHMAN,Sharonlee Nine 07/29/2013 12:13 PM

## 2013-08-01 ENCOUNTER — Ambulatory Visit (HOSPITAL_COMMUNITY)
Admission: RE | Admit: 2013-08-01 | Discharge: 2013-08-01 | Disposition: A | Discharge status: Home / Self Care | Payer: 59 | Source: Ambulatory Visit | Attending: Gastroenterology | Admitting: Gastroenterology

## 2013-08-01 ENCOUNTER — Encounter (HOSPITAL_COMMUNITY): Payer: Self-pay | Admitting: *Deleted

## 2013-08-01 ENCOUNTER — Encounter (HOSPITAL_COMMUNITY)
Admission: RE | Disposition: A | Discharge status: Home / Self Care | Payer: Self-pay | Source: Ambulatory Visit | Attending: Gastroenterology

## 2013-08-01 DIAGNOSIS — D126 Benign neoplasm of colon, unspecified: Secondary | ICD-10-CM

## 2013-08-01 DIAGNOSIS — K625 Hemorrhage of anus and rectum: Secondary | ICD-10-CM

## 2013-08-01 DIAGNOSIS — K6289 Other specified diseases of anus and rectum: Secondary | ICD-10-CM

## 2013-08-01 DIAGNOSIS — K648 Other hemorrhoids: Secondary | ICD-10-CM | POA: Insufficient documentation

## 2013-08-01 DIAGNOSIS — R197 Diarrhea, unspecified: Secondary | ICD-10-CM

## 2013-08-01 HISTORY — PX: COLONOSCOPY: SHX5424

## 2013-08-01 HISTORY — PX: BIOPSY: SHX5522

## 2013-08-01 HISTORY — PX: HEMORRHOID BANDING: SHX5850

## 2013-08-01 SURGERY — COLONOSCOPY
Anesthesia: Moderate Sedation

## 2013-08-01 MED ORDER — SODIUM CHLORIDE 0.9 % IV SOLN
INTRAVENOUS | Status: DC
  Administered 2013-08-01: 1000 mL via INTRAVENOUS

## 2013-08-01 MED ORDER — MIDAZOLAM HCL 5 MG/5ML IJ SOLN
INTRAMUSCULAR | Status: DC | PRN
  Administered 2013-08-01 (×4): 2 mg via INTRAVENOUS

## 2013-08-01 MED ORDER — MIDAZOLAM HCL 5 MG/5ML IJ SOLN
INTRAMUSCULAR | Status: AC
  Filled 2013-08-01: qty 10

## 2013-08-01 MED ORDER — MEPERIDINE HCL 100 MG/ML IJ SOLN
INTRAMUSCULAR | Status: AC
  Filled 2013-08-01: qty 2

## 2013-08-01 MED ORDER — MEPERIDINE HCL 100 MG/ML IJ SOLN
INTRAMUSCULAR | Status: DC | PRN
  Administered 2013-08-01 (×3): 25 mg
  Administered 2013-08-01: 50 mg

## 2013-08-01 NOTE — H&P (Signed)
Primary Care Physician:  Harlow Asa, MD Primary Gastroenterologist:  Dr. Darrick Penna  Pre-Procedure History & Physical: HPI:  Theresa [name] is a 42 y.o. female here for Change in bowel habits/ BRBPR.  Past Medical History  Diagnosis Date  . IBS (irritable bowel syndrome)     constipation  . GERD (gastroesophageal reflux disease)   . Hyperlipidemia   . Chronic kidney disease     kidney stones  . Insomnia   . Anxiety   . Depression   . HPV (human papilloma virus) infection     Past Surgical History  Procedure Laterality Date  . Breast reduction surgery  2009  . Esophagogastroduodenoscopy  Dec 2009    Dr. Leone Payor: reflux esophagitis, proximal gastric polyps, benign  . Colonoscopy  Oct 2009    Dr. Leone Payor: int/ext hemorrhoids, ?mild proctitis but path benign, likely r/t irritation from bowel prep  . Breast surgery  2007    breast reduction    Prior to Admission medications   Medication Sig Start Date End Date Taking? Authorizing Provider  ALPRAZolam Prudy Feeler) 1 MG tablet TAKE 1 TABLET BY MOUTH 12 HOURS AS NEEDED FOR ANXIETY. 01/20/13  Yes Merlyn Albert, MD  citalopram (CELEXA) 40 MG tablet Take 1 tablet (40 mg total) by mouth daily. 12/10/12  Yes Merlyn Albert, MD  cycloSPORINE (RESTASIS) 0.05 % ophthalmic emulsion Place 1 drop into both eyes 2 (two) times daily.   Yes Historical Provider, MD  dexlansoprazole (DEXILANT) 60 MG capsule Take 1 capsule (60 mg total) by mouth daily. 07/16/13  Yes Nira Retort, NP  hydrocortisone (ANUSOL-HC) 25 MG suppository Place 1 suppository (25 mg total) rectally every 12 (twelve) hours. 07/07/13  Yes Nira Retort, NP  levonorgestrel (MIRENA) 20 MCG/24HR IUD 1 each by Intrauterine route once.    Historical Provider, MD  medroxyPROGESTERone (PROVERA) 10 MG tablet Take 10 mg by mouth daily. First 10 days of the month each month    Historical Provider, MD    Allergies as of 07/21/2013  . (No Known Allergies)    Family History  Problem Relation Age of  Onset  . Colon cancer Neg Hx   . Cancer Mother     head and neck  . Hypertension Father   . Hyperlipidemia Father     History   Social History  . Marital Status: Married    Spouse Name: N/A    Number of Children: 1  . Years of Education: N/A   Occupational History  . RN [employer]   Social History Main Topics  . Smoking status: Never Smoker   . Smokeless tobacco: Never Used     Comment: Never smoker  . Alcohol Use: Yes     Comment: socially  . Drug Use: No  . Sexual Activity: Yes   Other Topics Concern  . Not on file   Social History Narrative  . No narrative on file    Review of Systems: See HPI, otherwise negative ROS   Physical Exam: There were no vitals taken for this visit. General:   Alert,  pleasant and cooperative in NAD Head:  Normocephalic and atraumatic. Neck:  Supple; Lungs:  Clear throughout to auscultation.    Heart:  Regular rate and rhythm. Abdomen:  Soft, nontender and nondistended. Normal bowel sounds, without guarding, and without rebound.   Neurologic:  Alert and  oriented x4;  grossly normal neurologically.  Impression/Plan:     Change in bowel habits/ BRBPR.  PLAN:  1. TCS/?hemorrhoids  banding TODAY

## 2013-08-01 NOTE — Op Note (Addendum)
[facility] [address] [place], [address]   COLONOSCOPY PROCEDURE REPORT  PATIENT: Theresa, [name]  MR#: [id number] BIRTHDATE: 10-28-1970 , 42  yrs. old GENDER: Female ENDOSCOPIST: Jonette Eva, MD REFERRED [id number] Gerda Diss, M.D. PROCEDURE DATE:  08/01/2013 PROCEDURE:   Colonoscopy with biopsy, with snare polypectomy, and Hemorrhoidectomy via banding INDICATIONS:Rectal Bleeding and CHANGE IN BOWEL HABITS: unexplained diarrhea. MEDICATIONS: Demerol 125 mg IV and Versed 8 mg IV  DESCRIPTION OF PROCEDURE:    Physical exam was performed.  Informed consent was obtained from the patient after explaining the benefits, risks, and alternatives to procedure.  The patient was connected to monitor and placed in left lateral position. Continuous oxygen was provided by nasal cannula and IV medicine administered through an indwelling cannula.  After administration of sedation and rectal exam, the patients rectum was intubated and the EC-3890Li ([id number]) and EG-[id number] ([id number])  colonoscope was advanced under direct visualization to the ileum.  The scope was removed slowly by carefully examining the color, texture, anatomy, and integrity mucosa on the way out.  The patient was recovered in endoscopy and discharged home in satisfactory condition.    COLON FINDINGS: The mucosa appeared normal in the terminal ileum.  , A sessile polyp measuring 8 mm in size was found in the sigmoid colon.  A polypectomy was performed using snare cautery. ,OTHERWISE normal appearing cecum, ileocecal valve, and appendiceal orifice were identified.  The ascending, hepatic flexure, transverse, splenic flexure, descending, and rectum appeared unremarkable.  No cancers were seen.  Multiple biopsies were performed TO EVALUATE FOR MICROSCOPIC COLITIS. Moderate sized internal hemorrhoids were found.    3 BANDS APPLIED.  PREP QUALITY: good.-PT USED LIPTON CHICKEN SOUP MIX. CECAL W/D TIME: 16 minutes      COMPLICATIONS: None  ENDOSCOPIC IMPRESSION: 1.   RECTAL BLEEDING DUE TO Moderate sized internal hemorrhoids 2.   ONE COLON POLYP REMOVED 3.   Normal colon-NO SOURCE FOR DIARRHEA IDENTIFIED  RECOMMENDATIONS: CALL [phone] IF YOU HAVE A FEVER, A LARGE AMOUNT OF BLEEDING, OR DIFFICULTY URINATING.  DRINK WATER TO KEEP URINE LIGHT YELLOW. USE NAPROXEN OR IBUPROFEN TWICE DAILY FOR RECTAL DISCOMFORT. TYLENOL AS NEED FOR ADDITIONAL PAIN RELIEF. USE COLACE TWICE DAILY AS NEEDED TO SOFTEN STOOL. BIOPSY RESULT WILL BE BACK IN 7 DAYS. FOLLOW A LOW RESIDUE DIET UNTIL DEC 31. FOLLOW UP JAN 8 AT 1130. WILL NEED CELIAC SEROLOGIES.  Next colonoscopy in 5 YEARS IF SIMPLE ADENOMA AND 10 years IF HYPERPLASTIC POLYPS.       _______________________________ Rosalie DoctorJonette Eva, MD 08/26/2013 1:59 PM Revised: 08/26/2013 1:59 PM    PATIENT NAME:  Theresa, [name] MR#: [id number]

## 2013-08-04 ENCOUNTER — Encounter: Payer: Self-pay | Admitting: Gastroenterology

## 2013-08-05 ENCOUNTER — Other Ambulatory Visit: Payer: Self-pay | Admitting: Family Medicine

## 2013-08-05 ENCOUNTER — Telehealth: Payer: Self-pay | Admitting: Gastroenterology

## 2013-08-05 NOTE — Telephone Encounter (Signed)
Please call pt. She had a simple adenoma removed from her colon.   CALL [phone] IF YOU HAVE A FEVER, A LARGE AMOUNT OF BLEEDING, OR DIFFICULTY URINATING. DRINK WATER TO KEEP URINE LIGHT YELLOW. MAY USE NAPROXEN OR IBUPROFEN TWICE DAILY FOR RECTAL DISCOMFORT. TYLENOL AS NEED FOR ADDITIONAL PAIN RELIEF. USE COLACE TWICE DAILY AS NEEDED TO SOFTEN STOOL. FOLLOW A LOW RESIDUE DIET UNTIL DEC 31.  FOLLOW UP JAN 8 AT 1130. Next colonoscopy in 5 years. YOUR SISTERS, BROTHERS, CHILDREN, AND PARENTS NEED TO HAVE A COLONOSCOPY STARTING AT THE AGE OF 40.

## 2013-08-05 NOTE — Telephone Encounter (Signed)
Pt aware of results and follow up appointment. 

## 2013-08-05 NOTE — Telephone Encounter (Signed)
LMOM to call back

## 2013-08-06 ENCOUNTER — Encounter: Payer: Self-pay | Admitting: Gastroenterology

## 2013-08-06 NOTE — Telephone Encounter (Signed)
May have 30 tablets (me thinks she needs to keep her appointments)

## 2013-08-11 ENCOUNTER — Encounter (HOSPITAL_COMMUNITY): Payer: Self-pay | Admitting: Gastroenterology

## 2013-08-11 NOTE — Telephone Encounter (Signed)
Pt is aware of OV and appt card was mailed. Reminder in epic to have tcs in 5 years

## 2013-08-14 HISTORY — PX: POLYPECTOMY: SHX149

## 2013-08-19 ENCOUNTER — Encounter: Payer: Self-pay | Admitting: Family Medicine

## 2013-08-19 ENCOUNTER — Ambulatory Visit (INDEPENDENT_AMBULATORY_CARE_PROVIDER_SITE_OTHER): Payer: 59 | Admitting: Family Medicine

## 2013-08-19 VITALS — BP 102/70 | Ht 66.0 in | Wt 179.2 lb

## 2013-08-19 DIAGNOSIS — K219 Gastro-esophageal reflux disease without esophagitis: Secondary | ICD-10-CM

## 2013-08-19 DIAGNOSIS — F411 Generalized anxiety disorder: Secondary | ICD-10-CM

## 2013-08-19 DIAGNOSIS — G4700 Insomnia, unspecified: Secondary | ICD-10-CM

## 2013-08-19 MED ORDER — ALPRAZOLAM 1 MG PO TABS
ORAL_TABLET | ORAL | Status: DC
Start: 2013-08-19 — End: 2014-03-09

## 2013-08-19 MED ORDER — ESCITALOPRAM OXALATE 20 MG PO TABS: 20.0000 mg | ORAL_TABLET | Freq: Every day | ORAL | Status: DC

## 2013-08-19 NOTE — Patient Instructions (Signed)
Break celexa in half last day and take only 20 mg, then switch to lexapro daily

## 2013-08-19 NOTE — Progress Notes (Signed)
   Subjective:    Patient ID: Theresa [name], female    DOB: February 28, 1971, 43 y.o.   MRN: [id number]  Anxiety Presents for initial visit. Onset was 1 to 5 years ago. The problem has been gradually worsening. Symptoms include nervous/anxious behavior. The severity of symptoms is interfering with daily activities. The symptoms are aggravated by work stress. The quality of sleep is poor.   Past treatments include benzodiazephines and SSRIs. The treatment provided no relief. Compliance with prior treatments has been good.  Sinusitis This is a new problem. The current episode started in the past 7 days. The problem is unchanged. There has been no fever. Her pain is at a severity of 4/10. The pain is moderate. Associated symptoms include congestion, coughing and sinus pressure. Past treatments include oral decongestants. The treatment provided no relief.  Patient wants to discuss changing the Celexa to a different medication.   Diminished mood and stress, and has led to increased irritability  Has lost weight,  Reflux stable doing a lot better  Often has trouble sleeping, trying esential oils, has helped some. Trouble going back to sleep.  prozac didn't help and didn't make her feel well,    Review of Systems  HENT: Positive for congestion and sinus pressure.   Respiratory: Positive for cough.   Psychiatric/Behavioral: The patient is nervous/anxious.    chest pain no back pain no abdominal pain no change in bowel habits he     Objective:   Physical Exam Alert no apparent distress. HEENT moderate his congestion frontal tenderness neck supple. Lungs clear. Heart rare rhythm. Neuro exam intact.       Assessment & Plan:  Impression 1 rhinosinusitis #2 impression anxiety/depression worsening. No suicidal thoughts. A lot of stress with work currently. Number reflux 3 reflux stable 4 insomnia worsening discuss. Plan Z-Pak to use if symptoms persist. Switch to Lexapro. Stop Celexa. Diet exercise  discussed. Maintain Xanax. Strongly encourage exercise. Check every 6 months. WSL

## 2013-08-21 ENCOUNTER — Ambulatory Visit (INDEPENDENT_AMBULATORY_CARE_PROVIDER_SITE_OTHER): Payer: 59 | Admitting: Gastroenterology

## 2013-08-21 ENCOUNTER — Encounter: Payer: Self-pay | Admitting: Gastroenterology

## 2013-08-21 DIAGNOSIS — K648 Other hemorrhoids: Secondary | ICD-10-CM | POA: Insufficient documentation

## 2013-08-21 NOTE — Assessment & Plan Note (Signed)
CONTINUE TO MONITOR SYMPTOMS.

## 2013-08-21 NOTE — Progress Notes (Signed)
CALLED PT. DOING WELL. No questions or concerns.

## 2013-08-26 ENCOUNTER — Ambulatory Visit: Payer: 59 | Admitting: Advanced Practice Midwife

## 2013-09-04 ENCOUNTER — Encounter: Payer: Self-pay | Admitting: Adult Health

## 2013-09-04 ENCOUNTER — Ambulatory Visit (INDEPENDENT_AMBULATORY_CARE_PROVIDER_SITE_OTHER): Payer: 59 | Admitting: Adult Health

## 2013-09-04 VITALS — BP 110/72 | Ht 66.0 in | Wt 173.0 lb

## 2013-09-04 DIAGNOSIS — N898 Other specified noninflammatory disorders of vagina: Secondary | ICD-10-CM | POA: Insufficient documentation

## 2013-09-04 DIAGNOSIS — B379 Candidiasis, unspecified: Secondary | ICD-10-CM

## 2013-09-04 HISTORY — DX: Candidiasis, unspecified: B37.9

## 2013-09-04 LAB — POCT WET PREP (WET MOUNT): WBC, Wet Prep HPF POC: POSITIVE

## 2013-09-04 MED ORDER — FLUCONAZOLE 150 MG PO TABS: ORAL_TABLET | ORAL | Status: DC

## 2013-09-04 MED ORDER — NYSTATIN-TRIAMCINOLONE 100000-0.1 UNIT/GM-% EX OINT: 1.0000 "application " | TOPICAL_OINTMENT | Freq: Two times a day (BID) | CUTANEOUS | Status: DC

## 2013-09-04 NOTE — Progress Notes (Signed)
Subjective:     Patient ID: Theresa [name], female   DOB: 1971-05-10, 43 y.o.   MRN: [id number]  HPI Theresa [name] is a 43 year old white female married in complaining of vaginal discharge and itch, used monistat, had Zpack about 1 week ago.  Review of Systems See HPI Reviewed past medical,surgical, social and family history. Reviewed medications and allergies.     Objective:   Physical Exam BP 110/72  Ht 5\' 6"  (1.676 m)  Wt 173 lb (78.472 kg)  BMI 27.94 kg/m2+LEUKS in urine   Skin warm and dry.Pelvic: external genitalia is normal in appearance, vagina: white discharge without odor,red side walls, cervix+IUD strings seen at os, uterus: normal size, shape and contour, non tender, no masses felt, adnexa: no masses or tenderness noted. Wet prep: + for yeast and WBCs  Assessment:     Vaginal discharge and itch Yeast     Plan:     Rx diflucan 150 mg #2 take 1 now and 1 in 3 days with 1 refill Rx mytrex cream use bid prn with 1 refill Review handout on yeast Return prn

## 2013-09-04 NOTE — Patient Instructions (Signed)
Monilial Vaginitis Vaginitis in a soreness, swelling and redness (inflammation) of the vagina and vulva. Monilial vaginitis is not a sexually transmitted infection. CAUSES  Yeast vaginitis is caused by yeast (candida) that is normally found in your vagina. With a yeast infection, the candida has overgrown in number to a point that upsets the chemical balance. SYMPTOMS   White, thick vaginal discharge.  Swelling, itching, redness and irritation of the vagina and possibly the lips of the vagina (vulva).  Burning or painful urination.  Painful intercourse. DIAGNOSIS  Things that may contribute to monilial vaginitis are:  Postmenopausal and virginal states.  Pregnancy.  Infections.  Being tired, sick or stressed, especially if you had monilial vaginitis in the past.  Diabetes. Good control will help lower the chance.  Birth control pills.  Tight fitting garments.  Using bubble bath, feminine sprays, douches or deodorant tampons.  Taking certain medications that kill germs (antibiotics).  Sporadic recurrence can occur if you become ill. TREATMENT  Your caregiver will give you medication.  There are several kinds of anti monilial vaginal creams and suppositories specific for monilial vaginitis. For recurrent yeast infections, use a suppository or cream in the vagina 2 times a week, or as directed.  Anti-monilial or steroid cream for the itching or irritation of the vulva may also be used. Get your caregiver's permission.  Painting the vagina with methylene blue solution may help if the monilial cream does not work.  Eating yogurt may help prevent monilial vaginitis. HOME CARE INSTRUCTIONS   Finish all medication as prescribed.  Do not have sex until treatment is completed or after your caregiver tells you it is okay.  Take warm sitz baths.  Do not douche.  Do not use tampons, especially scented ones.  Wear cotton underwear.  Avoid tight pants and panty  hose.  Tell your sexual partner that you have a yeast infection. They should go to their caregiver if they have symptoms such as mild rash or itching.  Your sexual partner should be treated as well if your infection is difficult to eliminate.  Practice safer sex. Use condoms.  Some vaginal medications cause latex condoms to fail. Vaginal medications that harm condoms are:  Cleocin cream.  Butoconazole (Femstat).  Terconazole (Terazol) vaginal suppository.  Miconazole (Monistat) (may be purchased over the counter). SEEK MEDICAL CARE IF:   You have a temperature by mouth above 102 F (38.9 C).  The infection is getting worse after 2 days of treatment.  The infection is not getting better after 3 days of treatment.  You develop blisters in or around your vagina.  You develop vaginal bleeding, and it is not your menstrual period.  You have pain when you urinate.  You develop intestinal problems.  You have pain with sexual intercourse. Document Released: 05/10/2005 Document Revised: 10/23/2011 Document Reviewed: 01/22/2009 [facility] Patient Information 2014 [place], [place]. Use diflucan Try mytrex Call prn

## 2013-09-05 ENCOUNTER — Other Ambulatory Visit: Payer: Self-pay | Admitting: Adult Health

## 2013-09-05 MED ORDER — CLOTRIMAZOLE-BETAMETHASONE 1-0.05 % EX CREA: 1.0000 "application " | TOPICAL_CREAM | Freq: Two times a day (BID) | CUTANEOUS | Status: DC

## 2013-09-18 ENCOUNTER — Telehealth: Payer: Self-pay | Admitting: Gastroenterology

## 2013-09-18 NOTE — Telephone Encounter (Signed)
Pt was asking if she could get some Dexilant samples. [phone]

## 2013-09-19 NOTE — Telephone Encounter (Signed)
LMOM that Dexilant samples are at front for pick up, and we leave at 12:00 noon today. ( Dexilant 60 mg #20 to take one daily).

## 2013-09-19 NOTE — Telephone Encounter (Signed)
REVIEWED.  

## 2013-10-18 ENCOUNTER — Telehealth: Payer: Self-pay | Admitting: Gastroenterology

## 2013-10-18 MED ORDER — LUBIPROSTONE 24 MCG PO CAPS: ORAL_CAPSULE | ORAL | Status: DC

## 2013-10-18 NOTE — Telephone Encounter (Signed)
PT REQUESTING MEDICINE FOR CONSTIPATION. RX SENT.

## 2013-11-13 ENCOUNTER — Telehealth: Payer: Self-pay | Admitting: Advanced Practice Midwife

## 2013-11-13 NOTE — Telephone Encounter (Signed)
I am not a believer in appetite suppressants anymore--they are no good for any kind of long term weight loss.

## 2013-11-17 NOTE — Telephone Encounter (Signed)
Pt aware Mylo Red, CNM does not prescribe appetite suppressants anymore.

## 2013-12-02 ENCOUNTER — Ambulatory Visit (INDEPENDENT_AMBULATORY_CARE_PROVIDER_SITE_OTHER): Payer: 59 | Admitting: Adult Health

## 2013-12-02 ENCOUNTER — Other Ambulatory Visit (HOSPITAL_COMMUNITY)
Admission: RE | Admit: 2013-12-02 | Discharge: 2013-12-02 | Disposition: A | Discharge status: Home / Self Care | Payer: 59 | Source: Ambulatory Visit | Attending: Adult Health | Admitting: Adult Health

## 2013-12-02 ENCOUNTER — Encounter: Payer: Self-pay | Admitting: Adult Health

## 2013-12-02 VITALS — BP 128/80 | Ht 66.0 in | Wt 188.0 lb

## 2013-12-02 DIAGNOSIS — Z01419 Encounter for gynecological examination (general) (routine) without abnormal findings: Secondary | ICD-10-CM | POA: Insufficient documentation

## 2013-12-02 DIAGNOSIS — R8789 Other abnormal findings in specimens from female genital organs: Secondary | ICD-10-CM

## 2013-12-02 DIAGNOSIS — Z1151 Encounter for screening for human papillomavirus (HPV): Secondary | ICD-10-CM | POA: Insufficient documentation

## 2013-12-02 DIAGNOSIS — R52 Pain, unspecified: Secondary | ICD-10-CM | POA: Insufficient documentation

## 2013-12-02 DIAGNOSIS — R21 Rash and other nonspecific skin eruption: Secondary | ICD-10-CM

## 2013-12-02 DIAGNOSIS — R87810 Cervical high risk human papillomavirus (HPV) DNA test positive: Secondary | ICD-10-CM

## 2013-12-02 DIAGNOSIS — Z8742 Personal history of other diseases of the female genital tract: Secondary | ICD-10-CM | POA: Insufficient documentation

## 2013-12-02 HISTORY — DX: Personal history of other diseases of the female genital tract: Z87.42

## 2013-12-02 NOTE — Patient Instructions (Signed)
Pap and physical in 6 months  Call with labs

## 2013-12-02 NOTE — Progress Notes (Signed)
Subjective:     Patient ID: Theresa [name], female   DOB: 1970-09-23, 43 y.o.   MRN: [id number]  HPI Elliemae is a 43 year old white female, married, works as Therapist, sports, in for pap and complains of rash inner ankles and body aches.Had rash biopsied  And was negative.This has been occurring since 2004 after birth of daughter.Had picture on phone looks like lupus rash, almost bullseye.Denies weight loss, rash on face.The stiffness or aches last about 10 -15 minutes and can be in am or pm.  Review of Systems See HPI Reviewed past medical,surgical, social and family history. Reviewed medications and allergies.      Objective:   Physical Exam BP 128/80  Ht '5\' 6"'  (1.676 m)  Wt 188 lb (85.276 kg)  BMI 30.36 kg/m2   Skin warm and dry.Pelvic: external genitalia is normal in appearance, vagina: no discharge or odor, cervix:smooth and bulbous,+IUD strings short,pap with HPV performed, uterus: normal size, shape and contour, non tender, no masses felt, adnexa: no masses or tenderness noted.  Has red ras both inner ankles, usually L>R but both today. Discussed referring to rheumatologist.  Assessment:     History of abnormal pap  Rash Body aches     Plan:     Check ANA,ESR,CRP,RF Return in 6 months for pap and physical

## 2013-12-03 LAB — ANTI-NUCLEAR AB-TITER (ANA TITER): ANA Titer 1: NEGATIVE

## 2013-12-03 LAB — C-REACTIVE PROTEIN

## 2013-12-03 LAB — RHEUMATOID FACTOR: Rhuematoid fact SerPl-aCnc: <10 IU/mL (ref <=14)

## 2013-12-03 LAB — SEDIMENTATION RATE: Sed Rate: 11 mm/hr (ref 0–22)

## 2013-12-03 LAB — ANA: Anti Nuclear Antibody(ANA): POSITIVE — AB

## 2013-12-05 ENCOUNTER — Telehealth: Payer: Self-pay | Admitting: Adult Health

## 2013-12-05 NOTE — Telephone Encounter (Signed)
Pt aware of labs  

## 2013-12-08 ENCOUNTER — Encounter: Payer: Self-pay | Admitting: Adult Health

## 2013-12-08 ENCOUNTER — Telehealth: Payer: Self-pay | Admitting: Adult Health

## 2013-12-08 NOTE — Telephone Encounter (Signed)
Left message to call back  

## 2013-12-08 NOTE — Telephone Encounter (Signed)
Pt aware of normal pap, negative HPV and that I spoke with Dr Ouida Sills a rheumatologist and he said check for HEP C and do CCP antibody.

## 2013-12-09 ENCOUNTER — Other Ambulatory Visit: Payer: 59

## 2013-12-09 DIAGNOSIS — R52 Pain, unspecified: Secondary | ICD-10-CM

## 2013-12-10 ENCOUNTER — Telehealth: Payer: Self-pay | Admitting: Adult Health

## 2013-12-10 LAB — CYCLIC CITRUL PEPTIDE ANTIBODY, IGG: Cyclic Citrullin Peptide Ab: <2 U/mL (ref 0.0–5.0)

## 2013-12-10 LAB — HEPATITIS C ANTIBODY: HCV Ab: NEGATIVE

## 2013-12-10 NOTE — Telephone Encounter (Signed)
Pt aware labs negative.  

## 2013-12-23 ENCOUNTER — Encounter: Payer: Self-pay | Admitting: Adult Health

## 2013-12-24 ENCOUNTER — Encounter: Payer: Self-pay | Admitting: Adult Health

## 2013-12-25 ENCOUNTER — Encounter: Payer: Self-pay | Admitting: Adult Health

## 2014-01-14 ENCOUNTER — Encounter: Payer: Self-pay | Admitting: Adult Health

## 2014-01-14 ENCOUNTER — Ambulatory Visit (INDEPENDENT_AMBULATORY_CARE_PROVIDER_SITE_OTHER): Payer: 59 | Admitting: Adult Health

## 2014-01-14 VITALS — BP 124/82 | Ht 66.0 in | Wt 196.0 lb

## 2014-01-14 DIAGNOSIS — E669 Obesity, unspecified: Secondary | ICD-10-CM

## 2014-01-14 HISTORY — DX: Obesity, unspecified: E66.9

## 2014-01-14 MED ORDER — VALACYCLOVIR HCL 1 G PO TABS: ORAL_TABLET | ORAL | Status: DC

## 2014-01-14 MED ORDER — PHENTERMINE HCL 37.5 MG PO CAPS: 37.5000 mg | ORAL_CAPSULE | ORAL | Status: DC

## 2014-01-14 NOTE — Patient Instructions (Signed)
Calorie Counting Diet A calorie counting diet requires you to eat the number of calories that are right for you in a day. Calories are the measurement of how much energy you get from the food you eat. Eating the right amount of calories is important for staying at a healthy weight. If you eat too many calories, your body will store them as fat and you may gain weight. If you eat too few calories, you may lose weight. Counting the number of calories you eat during a day will help you know if you are eating the right amount. A Registered Dietitian can determine how many calories you need in a day. The amount of calories needed varies from person to person. If your goal is to lose weight, you will need to eat fewer calories. Losing weight can benefit you if you are overweight or have health problems such as heart disease, high blood pressure, or diabetes. If your goal is to gain weight, you will need to eat more calories. Gaining weight may be necessary if you have a certain health problem that causes your body to need more energy. TIPS Whether you are increasing or decreasing the number of calories you eat during a day, it may be hard to get used to changes in what you eat and drink. The following are tips to help you keep track of the number of calories you eat.  Measure foods at home with measuring cups. This helps you know the amount of food and number of calories you are eating.  Restaurants often serve food in amounts that are larger than 1 serving. While eating out, estimate how many servings of a food you are given. For example, a serving of cooked rice is  cup or about the size of half of a fist. Knowing serving sizes will help you be aware of how much food you are eating at restaurants.  Ask for smaller portion sizes or child-size portions at restaurants.  Plan to eat half of a meal at a restaurant. Take the rest home or share the other half with a friend.  Read the Nutrition Facts panel on  food labels for calorie content and serving size. You can find out how many servings are in a package, the size of a serving, and the number of calories each serving has.  For example, a package might contain 3 cookies. The Nutrition Facts panel on that package says that 1 serving is 1 cookie. Below that, it will say there are 3 servings in the container. The calories section of the Nutrition Facts label says there are 90 calories. This means there are 90 calories in 1 cookie (1 serving). If you eat 1 cookie you have eaten 90 calories. If you eat all 3 cookies, you have eaten 270 calories (3 servings x 90 calories = 270 calories). The list below tells you how big or small some common portion sizes are.  1 oz.........4 stacked dice.  3 oz.........Deck of cards.  1 tsp........Tip of little finger.  1 tbs........Thumb.  2 tbs........Golf ball.   cup.......Half of a fist.  1 cup........A fist. KEEP A FOOD LOG Write down every food item you eat, the amount you eat, and the number of calories in each food you eat during the day. At the end of the day, you can add up the total number of calories you have eaten. It may help to keep a list like the one below. Find out the calorie information by reading the   Nutrition Facts panel on food labels. Breakfast  Bran cereal (1 cup, 110 calories).  Fat-free milk ( cup, 45 calories). Snack  Apple (1 medium, 80 calories). Lunch  Spinach (1 cup, 20 calories).  Tomato ( medium, 20 calories).  Chicken breast strips (3 oz, 165 calories).  Shredded cheddar cheese ( cup, 110 calories).  Light [place] dressing (2 tbs, 60 calories).  Whole-wheat bread (1 slice, 80 calories).  Tub margarine (1 tsp, 35 calories).  Vegetable soup (1 cup, 160 calories). Dinner  Pork chop (3 oz, 190 calories).  Brown rice (1 cup, 215 calories).  Steamed broccoli ( cup, 20 calories).  Strawberries (1  cup, 65 calories).  Whipped cream (1 tbs, 50  calories). Daily Calorie Total: 1443 Document Released: 07/31/2005 Document Revised: 10/23/2011 Document Reviewed: 01/25/2007 [facility] Patient Information 2014 [place]. Follow in 4 weeks

## 2014-01-14 NOTE — Progress Notes (Signed)
Subjective:     Patient ID: Theresa [name], female   DOB: Mar 27, 1971, 43 y.o.   MRN: [id number]  HPI Theresa [name] is a 43 year old white female in requesting help with weight loss, and gained 6 lbs recently.  Review of Systems See HPI Reviewed past medical,surgical, social and family history. Reviewed medications and allergies.     Objective:   Physical Exam BP 124/82  Ht 5\' 6"  (1.676 m)  Wt 196 lb (88.905 kg)  BMI 31.65 kg/m2   Skin warm and dry.  Lungs: clear to ausculation bilaterally. Cardiovascular: regular rate and rhythm.  Assessment:     Obesity     Plan:    Refille valtrex for cold sores Rx adipex 37.5 mg #30 1 daily no refills Follow up in 4 weeks for weight and BP check Review handout on weight loss

## 2014-01-20 ENCOUNTER — Telehealth: Payer: Self-pay

## 2014-01-20 NOTE — Telephone Encounter (Signed)
Pt called for samples of Dexilant 60 mg. #15 at front for pick up.

## 2014-01-21 NOTE — Telephone Encounter (Signed)
REVIEWED.  

## 2014-02-03 ENCOUNTER — Encounter: Payer: Self-pay | Admitting: Adult Health

## 2014-02-04 ENCOUNTER — Encounter: Payer: Self-pay | Admitting: Adult Health

## 2014-02-06 ENCOUNTER — Ambulatory Visit: Payer: 59 | Admitting: Adult Health

## 2014-02-08 ENCOUNTER — Encounter: Payer: Self-pay | Admitting: Family Medicine

## 2014-02-08 ENCOUNTER — Other Ambulatory Visit: Payer: Self-pay | Admitting: Family Medicine

## 2014-02-09 ENCOUNTER — Other Ambulatory Visit: Payer: Self-pay | Admitting: Adult Health

## 2014-02-09 ENCOUNTER — Other Ambulatory Visit: Payer: Self-pay | Admitting: *Deleted

## 2014-02-09 MED ORDER — PHENTERMINE HCL 37.5 MG PO CAPS: 37.5000 mg | ORAL_CAPSULE | ORAL | Status: DC

## 2014-02-09 MED ORDER — CITALOPRAM HYDROBROMIDE 40 MG PO TABS: 40.0000 mg | ORAL_TABLET | Freq: Every day | ORAL | Status: DC

## 2014-03-08 ENCOUNTER — Other Ambulatory Visit: Payer: Self-pay | Admitting: Family Medicine

## 2014-03-09 ENCOUNTER — Other Ambulatory Visit: Payer: Self-pay | Admitting: Family Medicine

## 2014-03-09 NOTE — Telephone Encounter (Signed)
Pt was encour on 6 29 to set up appt, let's do, one mo ref on this plus one more mo on celexa

## 2014-03-10 MED ORDER — CITALOPRAM HYDROBROMIDE 40 MG PO TABS: 40.0000 mg | ORAL_TABLET | Freq: Every day | ORAL | Status: DC

## 2014-03-10 NOTE — Telephone Encounter (Signed)
Patient said that she is going to try to wean herself off of the meds. She said she knows how to do it. Pt said her co-pay is too much for her to come in and have an OV. However, she said she will make an OV if she has trouble weaning herself off the meds. b/c I told her she needs an OV with [place] in order to get more refills. Pt verbalized understanding.

## 2014-03-16 ENCOUNTER — Telehealth: Payer: Self-pay | Admitting: Gastroenterology

## 2014-03-16 NOTE — Telephone Encounter (Signed)
REVIEWED.  

## 2014-03-16 NOTE — Telephone Encounter (Signed)
Routing to Dr. Fields.  

## 2014-03-16 NOTE — Telephone Encounter (Signed)
Pt called to see if she could get some Dexilant Samples. [phone]

## 2014-03-16 NOTE — Telephone Encounter (Signed)
Dexilant 60 mg #15 to take one daily. Samples at front and [facility] for pt to come by to pick up.

## 2014-04-01 ENCOUNTER — Other Ambulatory Visit: Payer: Self-pay | Admitting: Adult Health

## 2014-04-08 ENCOUNTER — Encounter: Payer: Self-pay | Admitting: Adult Health

## 2014-04-13 ENCOUNTER — Ambulatory Visit: Payer: 59 | Admitting: Family Medicine

## 2014-04-29 ENCOUNTER — Telehealth: Payer: Self-pay | Admitting: Gastroenterology

## 2014-04-29 NOTE — Telephone Encounter (Signed)
Pt called this morning asking if she could get some dexilant samples and she would stop by after work around 230pm. (she works at [employer])

## 2014-04-29 NOTE — Telephone Encounter (Signed)
Dexilant 60 mg samples #15 at front to take one daily.

## 2014-04-30 NOTE — Telephone Encounter (Signed)
REVIEWED.  

## 2014-05-19 ENCOUNTER — Emergency Department (INDEPENDENT_AMBULATORY_CARE_PROVIDER_SITE_OTHER)
Admission: EM | Admit: 2014-05-19 | Discharge: 2014-05-19 | Disposition: A | Discharge status: Home / Self Care | Payer: Self-pay | Source: Home / Self Care

## 2014-05-19 ENCOUNTER — Encounter (HOSPITAL_COMMUNITY): Payer: Self-pay | Admitting: Emergency Medicine

## 2014-05-19 DIAGNOSIS — Z043 Encounter for examination and observation following other accident: Principal | ICD-10-CM

## 2014-05-19 DIAGNOSIS — S40212A Abrasion of left shoulder, initial encounter: Secondary | ICD-10-CM

## 2014-05-19 DIAGNOSIS — Z041 Encounter for examination and observation following transport accident: Secondary | ICD-10-CM
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
## 2014-05-19 NOTE — Discharge Instructions (Signed)
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
## 2014-05-21 ENCOUNTER — Telehealth: Payer: Self-pay | Admitting: Gastroenterology

## 2014-05-21 NOTE — ED Provider Notes (Signed)
Medical screening examination/treatment/procedure(s) were performed by resident physician or non-physician practitioner and as supervising physician I was immediately available for consultation/collaboration.   Pauline Good MD.   Billy Fischer, MD 05/21/14 [phone]

## 2014-05-21 NOTE — Telephone Encounter (Signed)
Can she have samples. Please advise

## 2014-05-21 NOTE — Telephone Encounter (Signed)
Pt called to see if she could get some dexilant samples and she would stop by after work.

## 2014-05-25 NOTE — Telephone Encounter (Signed)
REVIEWED. LEAVE #15 AT [place].

## 2014-05-25 NOTE — Telephone Encounter (Signed)
Done

## 2014-06-15 ENCOUNTER — Encounter (HOSPITAL_COMMUNITY): Payer: Self-pay | Admitting: Emergency Medicine

## 2014-06-17 ENCOUNTER — Encounter: Payer: Self-pay | Admitting: Adult Health

## 2014-06-19 ENCOUNTER — Other Ambulatory Visit: Payer: Self-pay

## 2014-06-19 ENCOUNTER — Encounter: Payer: Self-pay | Admitting: Gastroenterology

## 2014-06-19 MED ORDER — DEXLANSOPRAZOLE 60 MG PO CPDR: 60.0000 mg | DELAYED_RELEASE_CAPSULE | Freq: Every day | ORAL | Status: DC

## 2014-07-22 ENCOUNTER — Ambulatory Visit (HOSPITAL_COMMUNITY)
Admission: RE | Admit: 2014-07-22 | Discharge: 2014-07-22 | Disposition: A | Discharge status: Home / Self Care | Payer: 59 | Source: Ambulatory Visit | Attending: Pulmonary Disease | Admitting: Pulmonary Disease

## 2014-07-22 DIAGNOSIS — R609 Edema, unspecified: Secondary | ICD-10-CM | POA: Diagnosis present

## 2014-07-22 DIAGNOSIS — I517 Cardiomegaly: Secondary | ICD-10-CM

## 2014-07-22 NOTE — Progress Notes (Signed)
  Echocardiogram 2D Echocardiogram has been performed.  [place], [place] 07/22/2014, 12:40 PM

## 2014-09-09 ENCOUNTER — Telehealth: Payer: Self-pay | Admitting: *Deleted

## 2014-09-09 NOTE — Telephone Encounter (Signed)
Daughter is 29 and has period wanted to know when first exam is and I told her 65 or before if problems

## 2014-09-14 DIAGNOSIS — R7303 Prediabetes: Secondary | ICD-10-CM

## 2014-09-14 HISTORY — DX: Prediabetes: R73.03

## 2014-09-16 ENCOUNTER — Encounter (HOSPITAL_COMMUNITY): Payer: Self-pay | Admitting: Emergency Medicine

## 2014-09-16 ENCOUNTER — Emergency Department (HOSPITAL_COMMUNITY)
Admission: EM | Admit: 2014-09-16 | Discharge: 2014-09-16 | Disposition: A | Discharge status: Home / Self Care | Payer: 59 | Attending: Emergency Medicine | Admitting: Emergency Medicine

## 2014-09-16 ENCOUNTER — Emergency Department (HOSPITAL_COMMUNITY): Payer: 59

## 2014-09-16 DIAGNOSIS — R002 Palpitations: Secondary | ICD-10-CM | POA: Diagnosis not present

## 2014-09-16 DIAGNOSIS — Z86018 Personal history of other benign neoplasm: Secondary | ICD-10-CM | POA: Insufficient documentation

## 2014-09-16 DIAGNOSIS — R42 Dizziness and giddiness: Secondary | ICD-10-CM | POA: Insufficient documentation

## 2014-09-16 DIAGNOSIS — E669 Obesity, unspecified: Secondary | ICD-10-CM | POA: Insufficient documentation

## 2014-09-16 DIAGNOSIS — R5383 Other fatigue: Secondary | ICD-10-CM | POA: Insufficient documentation

## 2014-09-16 DIAGNOSIS — Z791 Long term (current) use of non-steroidal anti-inflammatories (NSAID): Secondary | ICD-10-CM | POA: Diagnosis not present

## 2014-09-16 DIAGNOSIS — Z79899 Other long term (current) drug therapy: Secondary | ICD-10-CM | POA: Diagnosis not present

## 2014-09-16 DIAGNOSIS — Z8619 Personal history of other infectious and parasitic diseases: Secondary | ICD-10-CM | POA: Diagnosis not present

## 2014-09-16 DIAGNOSIS — G4700 Insomnia, unspecified: Secondary | ICD-10-CM | POA: Diagnosis not present

## 2014-09-16 DIAGNOSIS — K219 Gastro-esophageal reflux disease without esophagitis: Secondary | ICD-10-CM | POA: Insufficient documentation

## 2014-09-16 DIAGNOSIS — E876 Hypokalemia: Secondary | ICD-10-CM | POA: Diagnosis not present

## 2014-09-16 DIAGNOSIS — R531 Weakness: Secondary | ICD-10-CM | POA: Insufficient documentation

## 2014-09-16 DIAGNOSIS — N189 Chronic kidney disease, unspecified: Secondary | ICD-10-CM | POA: Diagnosis not present

## 2014-09-16 HISTORY — DX: Edema, unspecified: R60.9

## 2014-09-16 LAB — CBC WITH DIFFERENTIAL/PLATELET
BASOS ABS: 0 10*3/uL (ref 0.0–0.1)
BASOS PCT: 0 % (ref 0–1)
EOS ABS: 0.6 10*3/uL (ref 0.0–0.7)
Eosinophils Relative: 7 % — ABNORMAL HIGH (ref 0–5)
HEMATOCRIT: 42.4 % (ref 36.0–46.0)
Hemoglobin: 14.1 g/dL (ref 12.0–15.0)
Lymphocytes Relative: 20 % (ref 12–46)
Lymphs Abs: 1.8 10*3/uL (ref 0.7–4.0)
MCH: 29.4 pg (ref 26.0–34.0)
MCHC: 33.3 g/dL (ref 30.0–36.0)
MCV: 88.5 fL (ref 78.0–100.0)
Monocytes Absolute: 0.5 10*3/uL (ref 0.1–1.0)
Monocytes Relative: 5 % (ref 3–12)
Neutro Abs: 6 10*3/uL (ref 1.7–7.7)
Neutrophils Relative %: 68 % (ref 43–77)
Platelets: 296 10*3/uL (ref 150–400)
RBC: 4.79 MIL/uL (ref 3.87–5.11)
RDW: 12.7 % (ref 11.5–15.5)
WBC: 8.9 10*3/uL (ref 4.0–10.5)

## 2014-09-16 LAB — LIPASE, BLOOD: Lipase: 24 U/L (ref 11–59)

## 2014-09-16 LAB — COMPREHENSIVE METABOLIC PANEL
ALT: 31 U/L (ref 0–35)
ANION GAP: 7 (ref 5–15)
AST: 32 U/L (ref 0–37)
Albumin: 4.4 g/dL (ref 3.5–5.2)
Alkaline Phosphatase: 78 U/L (ref 39–117)
BUN: 15 mg/dL (ref 6–23)
CALCIUM: 9 mg/dL (ref 8.4–10.5)
CHLORIDE: 102 mmol/L (ref 96–112)
CO2: 27 mmol/L (ref 19–32)
Creatinine, Ser: 0.96 mg/dL (ref 0.50–1.10)
GFR calc Af Amer: 83 mL/min — ABNORMAL LOW (ref >90)
GFR, EST NON AFRICAN AMERICAN: 72 mL/min — AB (ref >90)
Glucose, Bld: 133 mg/dL — ABNORMAL HIGH (ref 70–99)
Potassium: 3.2 mmol/L — ABNORMAL LOW (ref 3.5–5.1)
SODIUM: 136 mmol/L (ref 135–145)
TOTAL PROTEIN: 8.4 g/dL — AB (ref 6.0–8.3)
Total Bilirubin: 0.7 mg/dL (ref 0.3–1.2)

## 2014-09-16 LAB — URINALYSIS, ROUTINE W REFLEX MICROSCOPIC
Bilirubin Urine: NEGATIVE
Glucose, UA: NEGATIVE mg/dL
Hgb urine dipstick: NEGATIVE
Ketones, ur: NEGATIVE mg/dL
Leukocytes, UA: NEGATIVE
NITRITE: NEGATIVE
Protein, ur: NEGATIVE mg/dL
SPECIFIC GRAVITY, URINE: 1.01 (ref 1.005–1.030)
UROBILINOGEN UA: 0.2 mg/dL (ref 0.0–1.0)
pH: 6.5 (ref 5.0–8.0)

## 2014-09-16 LAB — TSH: TSH: 2.354 u[IU]/mL (ref 0.350–4.500)

## 2014-09-16 LAB — TROPONIN I: Troponin I: <0.03 ng/mL (ref <0.031)

## 2014-09-16 MED ORDER — POTASSIUM CHLORIDE CRYS ER 20 MEQ PO TBCR: 20.0000 meq | EXTENDED_RELEASE_TABLET | Freq: Two times a day (BID) | ORAL | Status: DC

## 2014-09-16 MED ORDER — SODIUM CHLORIDE 0.9 % IV BOLUS (SEPSIS)
1000.0000 mL | Freq: Once | INTRAVENOUS | Status: AC
  Administered 2014-09-16: 1000 mL via INTRAVENOUS

## 2014-09-16 MED ORDER — POTASSIUM CHLORIDE CRYS ER 20 MEQ PO TBCR
40.0000 meq | EXTENDED_RELEASE_TABLET | Freq: Once | ORAL | Status: AC
  Administered 2014-09-16: 40 meq via ORAL
  Filled 2014-09-16: qty 2

## 2014-09-16 NOTE — ED Notes (Signed)
Pt reports she has been seeing Dr. Luan Pulling for LE edema, is taking Lasix.

## 2014-09-16 NOTE — Discharge Instructions (Signed)
I discussed your situation with Dr. Luan Pulling. He felt it was okay to go home. Prescription for potassium. Increase fluids.

## 2014-09-16 NOTE — ED Provider Notes (Signed)
CSN: [id number]     Arrival date & time 09/16/14  1253 History  This chart was scribed for Nat Christen, MD by Stephania Fragmin, ED Scribe. This patient was seen in room APA02/APA02 and the patient's care was started at 1:48 PM.    Chief Complaint  Patient presents with  . Weakness   The history is provided by the patient and the spouse. No language interpreter was used.     HPI Comments: Theresa [name] is a 44 y.o. female who presents to the Emergency Department complaining of constant, severe lethargy and SOB that began 3 days ago. The next day, she felt went to work and felt lightheaded and tachycardic. Per husband, patient will sleep excessively and sometimes early on in the night. Patient was placed on Lasix 2 months ago by Dr. Luan Pulling, due to BLE edema from significant weight gain. He wanted her to see an endocrinologist, who she is scheduled to see in 12 days, on 09/28/14. She has noticed reduced leg swelling. Nothing makes her symptoms better or worse.   Past Medical History  Diagnosis Date  . IBS (irritable bowel syndrome)     constipation  . GERD (gastroesophageal reflux disease)   . Hyperlipidemia   . Chronic kidney disease     kidney stones  . Insomnia   . Anxiety   . Depression   . HPV (human papilloma virus) infection   . Colon adenoma DEC 2014    ONE(7 MM) SIMPLE([place])  . Vaginal discharge 09/04/2013  . Yeast infection 09/04/2013  . Vaginal Pap smear, abnormal   . History of abnormal cervical Pap smear 12/02/2013  . Rash 12/02/2013  . Body aches 12/02/2013  . Obesity 01/14/2014  . Edema    Past Surgical History  Procedure Laterality Date  . Breast reduction surgery  2009  . Esophagogastroduodenoscopy  Dec 2009    Dr. Carlean Purl: reflux esophagitis, proximal gastric polyps, benign  . Colonoscopy  Oct 2009    Dr. Carlean Purl: int/ext hemorrhoids, ?mild proctitis but path benign, likely r/t irritation from bowel prep  . Breast surgery  2007    breast reduction  . Colonoscopy N/A  08/01/2013    Procedure: COLONOSCOPY;  Surgeon: Danie Binder, MD;  [facility];  Service: Endoscopy;  Laterality: N/A;  12:00  . Hemorrhoid banding N/A 08/01/2013    Procedure: HEMORRHOID BANDING;  Surgeon: Danie Binder, MD;  [facility];  Service: Endoscopy;  Laterality: N/A;  internal hemorrhoid banding  . Esophageal biopsy N/A 08/01/2013    Procedure: BIOPSY;  Surgeon: Danie Binder, MD;  [facility];  Service: Endoscopy;  Laterality: N/A;  FOR MICROSCOPIC COLITIS   Family History  Problem Relation Age of Onset  . Colon cancer Neg Hx   . Cancer Mother     head and neck  . Hypertension Father   . Hyperlipidemia Father    History  Substance Use Topics  . Smoking status: Never Smoker   . Smokeless tobacco: Never Used     Comment: Never smoker  . Alcohol Use: Yes     Comment: socially   OB History    Gravida Para Term Preterm AB TAB SAB Ectopic Multiple Living   1 1        1      Review of Systems  Constitutional: Positive for fatigue.  Cardiovascular: Positive for palpitations.  Neurological: Positive for light-headedness.  All other systems reviewed and are negative.     Allergies  Review of patient's allergies indicates no known allergies.  Home Medications   Prior to Admission medications   Medication Sig Start Date End Date Taking? Authorizing Provider  ALPRAZolam (XANAX) 1 MG tablet TAKE 1 TABLET BY MOUTH EVERY 12 HOURS AS NEEDED FOR ANXIETY   Yes Mikey Kirschner, MD  cycloSPORINE (RESTASIS) 0.05 % ophthalmic emulsion Place 1 drop into both eyes 2 (two) times daily.   Yes Historical Provider, MD  dexlansoprazole (DEXILANT) 60 MG capsule Take 1 capsule (60 mg total) by mouth daily. 06/19/14  Yes Mahala Menghini, PA-C  furosemide (LASIX) 40 MG tablet Take 40 mg by mouth daily.   Yes Historical Provider, MD  meloxicam (MOBIC) 15 MG tablet Take 15 mg by mouth daily.   Yes Historical Provider, MD  PARoxetine (PAXIL) 20 MG tablet  Take 20 mg by mouth daily.   Yes Historical Provider, MD  citalopram (CELEXA) 40 MG tablet Take 1 tablet (40 mg total) by mouth daily. Patient not taking: Reported on 09/16/2014 03/10/14   Mikey Kirschner, MD  escitalopram (LEXAPRO) 20 MG tablet TAKE 1 TABLET BY MOUTH EVERY DAY Patient not taking: Reported on 09/16/2014    Mikey Kirschner, MD  levonorgestrel [facility]) 20 MCG/24HR IUD 1 each by Intrauterine route once.    Historical Provider, MD  phentermine 37.5 MG capsule Take 1 capsule (37.5 mg total) by mouth every morning. Patient not taking: Reported on 09/16/2014 02/09/14   Estill Dooms, NP  potassium chloride SA (K-DUR,KLOR-CON) 20 MEQ tablet Take 1 tablet (20 mEq total) by mouth 2 (two) times daily. 09/16/14   Nat Christen, MD  valACYclovir (VALTREX) 1000 MG tablet Take 2 now and 2 in am Patient not taking: Reported on 09/16/2014 01/14/14   Estill Dooms, NP   BP 122/78 mmHg  Pulse 84  Temp(Src) 98.3 F (36.8 C) (Oral)  Resp 16  Ht 5\' 6"  (1.676 m)  Wt 195 lb (88.451 kg)  BMI 31.49 kg/m2  SpO2 100% Physical Exam  Constitutional: She is oriented to person, place, and time. She appears well-developed and well-nourished.  HENT:  Head: Normocephalic and atraumatic.  Eyes: Conjunctivae and EOM are normal. Pupils are equal, round, and reactive to light.  Neck: Normal range of motion. Neck supple.  Cardiovascular: Normal rate and regular rhythm.   Pulmonary/Chest: Effort normal and breath sounds normal.  Abdominal: Soft. Bowel sounds are normal.  Musculoskeletal: Normal range of motion. She exhibits no edema (No leg swelling).  Neurological: She is alert and oriented to person, place, and time.  Skin: Skin is warm and dry.  Psychiatric: She has a normal mood and affect. Her behavior is normal.  Nursing note and vitals reviewed.   ED Course  Procedures (including critical care time)  DIAGNOSTIC STUDIES: Oxygen Saturation is 100% on room air, normal by my interpretation.     COORDINATION OF CARE: 1:57 PM - Discussed treatment plan with pt at bedside which includes several lab tests, and pt agreed to plan.   Labs Review Labs Reviewed  CBC WITH DIFFERENTIAL/PLATELET - Abnormal; Notable for the following:    Eosinophils Relative 7 (*)    All other components within normal limits  COMPREHENSIVE METABOLIC PANEL - Abnormal; Notable for the following:    Potassium 3.2 (*)    Glucose, Bld 133 (*)    Total Protein 8.4 (*)    GFR calc non Af Amer 72 (*)    GFR calc Af Amer 83 (*)    All other  components within normal limits  TROPONIN I  TSH  LIPASE, BLOOD  URINALYSIS, ROUTINE W REFLEX MICROSCOPIC    Imaging Review Dg Chest 2 View  09/16/2014   CLINICAL DATA:  Weakness and shortness of breath with exertion.  EXAM: CHEST - 2 VIEW  COMPARISON:  None  FINDINGS: The heart size and mediastinal contours are within normal limits. There is no evidence of pulmonary edema, consolidation, pneumothorax, nodule or pleural fluid. The visualized skeletal structures are unremarkable.  IMPRESSION: No active disease.   Electronically Signed   By: Aletta Edouard M.D.   On: 09/16/2014 15:06     EKG Interpretation   Date/Time:  Wednesday September 16 2014 13:28:01 EST Ventricular Rate:  105 PR Interval:  129 QRS Duration: 73 QT Interval:  326 QTC Calculation: 431 R Axis:   70 Text Interpretation:  Sinus tachycardia Borderline T wave abnormalities  Confirmed by Corin Formisano  MD, Guida Asman ([id number]) on 09/16/2014 4:04:47 PM      MDM   Final diagnoses:  Weakness  Hypokalemia   Patient appears in no acute distress. Screening tests including CBC, chemistry panel, troponin, TSH, lipase, urinalysis, chest x-ray, EKG showed no acute findings. Discussed with Dr. Luan Pulling. Replace potassium.  Discharge home  I personally performed the services described in this documentation, which was scribed in my presence. The recorded information has been reviewed and is accurate.      Nat Christen,  MD 09/16/14 1750

## 2014-09-16 NOTE — ED Notes (Signed)
DR. Luan Pulling HAS BEEN TREATING HER FOR LOWER EXTREMITY  EDEMA FOR 2 MONTHS.    SINCE Sunday HAS BEEN WEAK.

## 2014-09-24 ENCOUNTER — Telehealth: Payer: Self-pay

## 2014-09-24 NOTE — Telephone Encounter (Signed)
Pt called and requested samples of Dexilant 60 mg. #10 given.

## 2014-09-24 NOTE — Telephone Encounter (Signed)
REVIEWED-NO ADDITIONAL RECOMMENDATIONS. 

## 2014-10-14 ENCOUNTER — Other Ambulatory Visit (HOSPITAL_COMMUNITY): Payer: Self-pay | Admitting: Radiology

## 2014-10-14 DIAGNOSIS — G4730 Sleep apnea, unspecified: Secondary | ICD-10-CM

## 2014-10-28 ENCOUNTER — Ambulatory Visit (INDEPENDENT_AMBULATORY_CARE_PROVIDER_SITE_OTHER): Payer: 59 | Admitting: Internal Medicine

## 2014-10-28 ENCOUNTER — Encounter (INDEPENDENT_AMBULATORY_CARE_PROVIDER_SITE_OTHER): Payer: Self-pay | Admitting: *Deleted

## 2014-10-28 ENCOUNTER — Encounter (INDEPENDENT_AMBULATORY_CARE_PROVIDER_SITE_OTHER): Payer: Self-pay | Admitting: Internal Medicine

## 2014-10-28 VITALS — BP 108/70 | HR 74 | Temp 98.9°F | Resp 18 | Ht 66.0 in | Wt 205.0 lb

## 2014-10-28 DIAGNOSIS — R1011 Right upper quadrant pain: Secondary | ICD-10-CM

## 2014-10-28 DIAGNOSIS — R5383 Other fatigue: Secondary | ICD-10-CM

## 2014-10-28 DIAGNOSIS — M2550 Pain in unspecified joint: Secondary | ICD-10-CM

## 2014-10-28 NOTE — Progress Notes (Signed)
Presenting complaint;  Right upper quadrant abdominal pain. Nausea and vomiting. Fatigue.  History of present illness;  Patient is 44 year old Caucasian female, patient of Dr. Luan Pulling who is self-referred for GI evaluation. She presents with over the case history of right upper quadrant abdominal pain which goes straight to her back. This pain is worse at night. She describes this pain to be dull pain at times cramping and it reminds her of menstrual cramps. She is not sure if pain gets worse or better with meals. She has noted frequent nausea and sporadic vomiting. She has vomited bile and food. She denies fever or chills. She has noted dysphagia with pills only. Heartburn is well controlled with therapy. She has history of erosive reflux esophagitis noted on EGD of 2006.Marland Kitchen She has very good appetite. She has history of constipation but since she's been eating hearing fiber rich foods as well as FiberCon use her bowels been moving regularly. She denies melena. She hasn't had rectal bleeding since she had hemorrhoidal banding by Dr. Oneida Alar. She has gained 30 pounds last year. TSH has been normal. She complains of extreme fatigue which she's had for several months. All she can do his work and she is not able to exercise or do the things that she has been able to in the past. He also complains of joint pains. She has pain in her right wrist, elbow and shoulder and she has pain in her left knee and ankles. She also gives history of rash over her right leg. She had this biopsied by dermatologist and no abnormality noted. Her ANA has been positive at a low titer. She is concerned that she has lupus. She has just a very herself on showing at edematous rash over right leg. She feels thirsty all the time. She states and some nights she is not able to sleep on account of joint pains and ends up taking ibuprofen which is generally 800 mg. She does not take more than a few doses amount. She was treated with  meloxicam for bilateral plantar fasciitis but developed lower extremity edema which has resolved on stopping this medication. She took meloxicam for over 2 months. She has brought copy of her recent bloodwork for review which is summarized under lab data.   Current Medications: Outpatient Encounter Prescriptions as of 10/28/2014  Medication Sig  . ALPRAZolam (XANAX) 1 MG tablet TAKE 1 TABLET BY MOUTH EVERY 12 HOURS AS NEEDED FOR ANXIETY  . citalopram (CELEXA) 40 MG tablet Take 1 tablet (40 mg total) by mouth daily.  . cycloSPORINE (RESTASIS) 0.05 % ophthalmic emulsion Place 1 drop into both eyes 2 (two) times daily.  Marland Kitchen dexlansoprazole (DEXILANT) 60 MG capsule Take 1 capsule (60 mg total) by mouth daily.  Marland Kitchen levonorgestrel (MIRENA) 20 MCG/24HR IUD 1 each by Intrauterine route once.  . valACYclovir (VALTREX) 1000 MG tablet Take 2 now and 2 in am  . furosemide (LASIX) 40 MG tablet Take 40 mg by mouth daily.  . potassium chloride SA (K-DUR,KLOR-CON) 20 MEQ tablet Take 1 tablet (20 mEq total) by mouth 2 (two) times daily. (Patient not taking: Reported on 10/28/2014)  . [DISCONTINUED] escitalopram (LEXAPRO) 20 MG tablet TAKE 1 TABLET BY MOUTH EVERY DAY (Patient not taking: Reported on 09/16/2014)  . [DISCONTINUED] meloxicam (MOBIC) 15 MG tablet Take 15 mg by mouth daily.  . [DISCONTINUED] PARoxetine (PAXIL) 20 MG tablet Take 20 mg by mouth daily.  . [DISCONTINUED] phentermine 37.5 MG capsule Take 1 capsule (37.5 mg total) by  mouth every morning. (Patient not taking: Reported on 09/16/2014)   Past medical history;  Gastroesophageal reflux disease. She had EGD in 2006 by Dr. Silvano Rusk and found to have erosive esophagitis. History of urolithiasis. She has passed at least evidence stones. Bilateral breast reduction in 2006. Obesity. She was diagnosed with depression and stress disorder in March 2011 resulting from her mothers diagnosis of head and neck carcinoma. He died at age 26. She had colonoscopy  in December 2014 with removal of single polyp from sigmoid colon and was tubular adenoma. Random biopsies from sigmoid colon were unremarkable. She was noted to have hemorrhoids which were subsequently banded by Dr. Oneida Alar. Bilateral plantar fasciitis diagnosis in November 2015. She has had hepatitis B vaccination.  Allergies; No Known Allergies  Family history;  Mother died of head and neck carcinoma at age 24. Father has A. fib and hypertension but doing well. Paternal grandfather died of pancreatic carcinoma at age 15. Paternal aunt has SLE. She does not have any siblings.  Social history;  He is married and has one daughter age 41 in good health. 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 She has never smoked cigarettes and drinks alcohol occasionally. She does not do exercise on regular basis  Objective: Blood pressure 108/70, pulse 74, temperature 98.9 F (37.2 C), temperature source Oral, resp. rate 18, height 5\' 6"  (1.676 m), weight 205 lb (92.987 kg). Issue is alert and in no acute distress. Conjunctiva is pink. Sclera is nonicteric Oropharyngeal mucosa is normal. No neck masses or thyromegaly noted. Cardiac exam with regular rhythm normal S1 and S2. No murmur or gallop noted. Lungs are clear to auscultation. Abdomen is full. Bowel sounds are normal. On palpation abdomen is soft with mild tenderness in midepigastrium and below the right costal margin. Liver edge is 4 cm below RCM at end inspiration. Spleen is not palpable. No LE edema or clubbing noted. She has faint macular rash involving medial aspect of right and above malleolus. She has tattoo over left forearm. No edema noted to interphalangeal, wrist joints or elbows.  Labs/studies Results: Lab data from 06/02/2014  W BC 9.0, H&H 13.1 and 38.0 and platelet count 282K  Differential pertinent for 6% eosinophils  Electrolytes within normal limits, glucose 72, BUN 14, creatinine 0.90  Serum calcium 8.8  Bilirubin 0.7, AP 64,  AST 14, ALT 10 and albumin 4.1   Lab data from 07/22/2014 Serum calcium 8.7 BNP 10.4 just normal  Lab data from 09/23/2014 WBC 7.6, H&H 14.7 and 42.5 and platelet count 320K Differential pertinent for by mouth count of 6%. Serum ferritin 36 ng/mL CRP 2.5(normal up to 4.9) Vitamin B12 532 pg/mL ANA positive at 1-80 with homogeneous pattern. Hemoglobin A1c 5.8% TSH 2.460 Vit. D2 level 32.6 24 hour free urinary cortisol level 13.0 which is within normal limits.  Ultrasound in April 2011 and was negative for cholelithiasis.   Assessment:  #1. Right upper quadrant abdominal pain of recent onset associated with nausea and sporadic vomiting. Symptoms are not typical of biliary tract disease but is still needs to be ruled out for other diagnoses considered. #2. Chronic GERD. She has history of erosive reflux esophagitis and heartburns well controlled with PPI. #3. Polyarthralgias with asymmetric distribution. No abnormality noted in both hands elbows or wrist. She has had positive ANA at a low titer but  RF and CRP were negative. She may eventually have to be evaluated by rheumatologist but I would leave this up to Dr. Luan Pulling.  She has had mild eosinophilia which would appear to be nonspecific. #4. Severe fatigue affecting quality of life.  Since she has multiple unexplained symptoms she needs to be screened for celiac disease.  Recommendations;  CBC with differential. Sedimentation rate. LFTs. Celiac antibody panel. Upper abdominal ultrasound. Further recommendations to follow.   CC Dr. Alonza Bogus, MD

## 2014-10-28 NOTE — Patient Instructions (Signed)
Physician will contact you with results of blood tests and ultrasound when completed

## 2014-10-29 ENCOUNTER — Other Ambulatory Visit (HOSPITAL_COMMUNITY): Payer: 59

## 2014-10-29 ENCOUNTER — Ambulatory Visit (HOSPITAL_COMMUNITY)
Admission: RE | Admit: 2014-10-29 | Discharge: 2014-10-29 | Disposition: A | Discharge status: Home / Self Care | Payer: 59 | Source: Ambulatory Visit | Attending: Internal Medicine | Admitting: Internal Medicine

## 2014-10-29 ENCOUNTER — Telehealth (HOSPITAL_COMMUNITY): Payer: Self-pay | Admitting: Radiology

## 2014-10-29 DIAGNOSIS — R1011 Right upper quadrant pain: Secondary | ICD-10-CM | POA: Diagnosis present

## 2014-10-29 DIAGNOSIS — K8020 Calculus of gallbladder without cholecystitis without obstruction: Secondary | ICD-10-CM | POA: Diagnosis not present

## 2014-10-29 LAB — CBC WITH DIFFERENTIAL/PLATELET
BASOS PCT: 1 % (ref 0–1)
Basophils Absolute: 0.1 10*3/uL (ref 0.0–0.1)
EOS PCT: 8 % — AB (ref 0–5)
Eosinophils Absolute: 0.7 10*3/uL (ref 0.0–0.7)
HCT: 36.1 % (ref 36.0–46.0)
HEMOGLOBIN: 11.8 g/dL — AB (ref 12.0–15.0)
LYMPHS PCT: 21 % (ref 12–46)
Lymphs Abs: 1.8 10*3/uL (ref 0.7–4.0)
MCH: 28.8 pg (ref 26.0–34.0)
MCHC: 32.7 g/dL (ref 30.0–36.0)
MCV: 88 fL (ref 78.0–100.0)
MPV: 9.5 fL (ref 8.6–12.4)
Monocytes Absolute: 0.4 10*3/uL (ref 0.1–1.0)
Monocytes Relative: 5 % (ref 3–12)
NEUTROS PCT: 65 % (ref 43–77)
Neutro Abs: 5.5 10*3/uL (ref 1.7–7.7)
Platelets: 262 10*3/uL (ref 150–400)
RBC: 4.1 MIL/uL (ref 3.87–5.11)
RDW: 13.3 % (ref 11.5–15.5)
WBC: 8.5 10*3/uL (ref 4.0–10.5)

## 2014-10-29 LAB — HEPATIC FUNCTION PANEL
ALK PHOS: 65 U/L (ref 39–117)
ALT: 19 U/L (ref 0–35)
AST: 20 U/L (ref 0–37)
Albumin: 4.2 g/dL (ref 3.5–5.2)
Bilirubin, Direct: 0.1 mg/dL (ref 0.0–0.3)
Indirect Bilirubin: 0.2 mg/dL (ref 0.2–1.2)
TOTAL PROTEIN: 6.8 g/dL (ref 6.0–8.3)
Total Bilirubin: 0.3 mg/dL (ref 0.2–1.2)

## 2014-10-29 LAB — GLIA (IGA/G) + TTG IGA
GLIADIN IGG: 7 U (ref <20)
Gliadin IgA: 8 Units (ref <20)
Tissue Transglutaminase Ab, IgA: 1 U/mL (ref <4)

## 2014-10-29 LAB — SEDIMENTATION RATE: Sed Rate: 12 mm/hr (ref 0–20)

## 2014-10-30 ENCOUNTER — Ambulatory Visit (HOSPITAL_COMMUNITY): Admission: RE | Admit: 2014-10-30 | Payer: 59 | Source: Ambulatory Visit

## 2014-10-30 ENCOUNTER — Encounter (INDEPENDENT_AMBULATORY_CARE_PROVIDER_SITE_OTHER): Payer: Self-pay

## 2014-11-02 NOTE — Patient Instructions (Signed)
Theresa [name]  11/02/2014   Your procedure is scheduled on:   11/04/2014  Report to [facility] at  1100  AM.  Call this number if you have problems the morning of surgery: [phone]   Remember:   Do not eat food or drink liquids after midnight.   Take these medicines the morning of surgery with A SIP OF WATER: xanax, celexa, dexilant   Do not wear jewelry, make-up or nail polish.  Do not wear lotions, powders, or perfumes.   Do not shave 48 hours prior to surgery. Men may shave face and neck.  Do not bring valuables to the hospital.  [facility] is not responsible for any belongings or valuables.               Contacts, dentures or bridgework may not be worn into surgery.  Leave suitcase in the car. After surgery it may be brought to your room.  For patients admitted to the hospital, discharge time is determined by your treatment team.               Patients discharged the day of surgery will not be allowed to drive home.  Name and phone number of your driver: family  Special Instructions: Shower using CHG 2 nights before surgery and the night before surgery.  If you shower the day of surgery use CHG.  Use special wash - you have one bottle of CHG for all showers.  You should use approximately 1/3 of the bottle for each shower.   Please read over the following fact sheets that you were given: Pain Booklet, Coughing and Deep Breathing, Surgical Site Infection Prevention, Anesthesia Post-op Instructions and Care and Recovery After Surgery Laparoscopic Cholecystectomy Laparoscopic cholecystectomy is surgery to remove the gallbladder. The gallbladder is located in the upper right part of the abdomen, behind the liver. It is a storage sac for bile produced in the liver. Bile aids in the digestion and absorption of fats. Cholecystectomy is often done for inflammation of the gallbladder (cholecystitis). This condition is usually caused by a buildup of gallstones (cholelithiasis) in your  gallbladder. Gallstones can block the flow of bile, resulting in inflammation and pain. In severe cases, emergency surgery may be required. When emergency surgery is not required, you will have time to prepare for the procedure. Laparoscopic surgery is an alternative to open surgery. Laparoscopic surgery has a shorter recovery time. Your common bile duct may also need to be examined during the procedure. If stones are found in the common bile duct, they may be removed. LET [facility] CARE PROVIDER KNOW ABOUT:  Any allergies you have.  All medicines you are taking, including vitamins, herbs, eye drops, creams, and over-the-counter medicines.  Previous problems you or members of your family have had with the use of anesthetics.  Any blood disorders you have.  Previous surgeries you have had.  Medical conditions you have. RISKS AND COMPLICATIONS Generally, this is a safe procedure. However, as with any procedure, complications can occur. Possible complications include:  Infection.  Damage to the common bile duct, nerves, arteries, veins, or other internal organs such as the stomach, liver, or intestines.  Bleeding.  A stone may remain in the common bile duct.  A bile leak from the cyst duct that is clipped when your gallbladder is removed.  The need to convert to open surgery, which requires a larger incision in the abdomen. This may be necessary if your surgeon  thinks it is not safe to continue with a laparoscopic procedure. BEFORE THE PROCEDURE  Ask your health care provider about changing or stopping any regular medicines. You will need to stop taking aspirin or blood thinners at least 5 days prior to surgery.  Do not eat or drink anything after midnight the night before surgery.  Let your health care provider know if you develop a cold or other infectious problem before surgery. PROCEDURE   You will be given medicine to make you sleep through the procedure (general  anesthetic). A breathing tube will be placed in your mouth.  When you are asleep, your surgeon will make several small cuts (incisions) in your abdomen.  A thin, lighted tube with a tiny camera on the end (laparoscope) is inserted through one of the small incisions. The camera on the laparoscope sends a picture to a TV screen in the operating room. This gives the surgeon a good view inside your abdomen.  A gas will be pumped into your abdomen. This expands your abdomen so that the surgeon has more room to perform the surgery.  Other tools needed for the procedure are inserted through the other incisions. The gallbladder is removed through one of the incisions.  After the removal of your gallbladder, the incisions will be closed with stitches, staples, or skin glue. AFTER THE PROCEDURE  You will be taken to a recovery area where your progress will be checked often.  You may be allowed to go home the same day if your pain is controlled and you can tolerate liquids. Document Released: 07/31/2005 Document Revised: 05/21/2013 Document Reviewed: 03/12/2013 [facility] Patient Information 2015 [place], [place]. This information is not intended to replace advice given to you by your health care provider. Make sure you discuss any questions you have with your health care provider. PATIENT INSTRUCTIONS POST-ANESTHESIA  IMMEDIATELY FOLLOWING SURGERY:  Do not drive or operate machinery for the first twenty four hours after surgery.  Do not make any important decisions for twenty four hours after surgery or while taking narcotic pain medications or sedatives.  If you develop intractable nausea and vomiting or a severe headache please notify your doctor immediately.  FOLLOW-UP:  Please make an appointment with your surgeon as instructed. You do not need to follow up with anesthesia unless specifically instructed to do so.  WOUND CARE INSTRUCTIONS (if applicable):  Keep a dry clean dressing on the  anesthesia/puncture wound site if there is drainage.  Once the wound has quit draining you may leave it open to air.  Generally you should leave the bandage intact for twenty four hours unless there is drainage.  If the epidural site drains for more than 36-48 hours please call the anesthesia department.  QUESTIONS?:  Please feel free to call your physician or the hospital operator if you have any questions, and they will be happy to assist you.

## 2014-11-02 NOTE — H&P (Signed)
Theresa [name] is an 44 y.o. female.   Chief Complaint: Biliary colic, cholelithiasis, elevated liver enzyme tests HPI: Theresa [name] is a 44 year old white female who presents with a long-standing history of worsening right upper quadrant abdominal pain and bloating. She does get nauseated. Ultrasound the gallbladder reveals cholelithiasis. Normal common bile duct was found. Theresa [name] has been referred for laparoscopic cholecystectomy as well as liver biopsy by GI.  Past Medical History  Diagnosis Date  . IBS (irritable bowel syndrome)     constipation  . GERD (gastroesophageal reflux disease)   . Hyperlipidemia   . Chronic kidney disease     kidney stones  . Insomnia   . Anxiety   . Depression   . HPV (human papilloma virus) infection   . Colon adenoma DEC 2014    ONE(7 MM) SIMPLE([place])  . Vaginal discharge 09/04/2013  . Yeast infection 09/04/2013  . Vaginal Pap smear, abnormal   . History of abnormal cervical Pap smear 12/02/2013  . Rash 12/02/2013  . Body aches 12/02/2013  . Obesity 01/14/2014  . Edema   . Pre-diabetes 09/2014    Past Surgical History  Procedure Laterality Date  . Breast reduction surgery  2009  . Esophagogastroduodenoscopy  Dec 2009    Dr. Carlean Purl: reflux esophagitis, proximal gastric polyps, benign  . Colonoscopy  Oct 2009    Dr. Carlean Purl: int/ext hemorrhoids, ?mild proctitis but path benign, likely r/t irritation from bowel prep  . Breast surgery  2007    breast reduction  . Colonoscopy N/A 08/01/2013    Procedure: COLONOSCOPY;  Surgeon: Danie Binder, MD;  Location: [facility];  Service: Endoscopy;  Laterality: N/A;  12:00  . Hemorrhoid banding N/A 08/01/2013    Procedure: HEMORRHOID BANDING;  Surgeon: Danie Binder, MD;  Location: [facility] END[facility] SUITE;  Service: Endoscopy;  Laterality: N/A;  internal hemorrhoid banding  . Esophageal biopsy N/A 08/01/2013    Procedure: BIOPSY;  Surgeon: Danie Binder, MD;  Location: [facility];  Service: Endoscopy;  Laterality: N/A;   FOR MICROSCOPIC COLITIS  . Upper gastrointestinal endoscopy  2006    Dr. Nita Sickle in [place]  . Polypectomy  2015    Dr.Fields    Family History  Problem Relation Age of Onset  . Colon cancer Neg Hx   . Cancer Mother     head and neck  . Hypertension Father   . Hyperlipidemia Father   . Pancreatic cancer Maternal Grandfather    Social History:  reports that she has never smoked. She has never used smokeless tobacco. She reports that she drinks alcohol. She reports that she does not use illicit drugs.  Allergies: No Known Allergies  No prescriptions prior to admission    No results found for this or any previous visit (from the past 48 hour(s)). No results found.  Review of Systems  Constitutional: Negative.   HENT: Negative.   Eyes: Negative.   Cardiovascular: Negative.   Gastrointestinal: Positive for heartburn, nausea and abdominal pain.  Genitourinary: Negative.   Musculoskeletal: Negative.   Skin: Negative.     There were no vitals taken for this visit. Physical Exam  Constitutional: She is oriented to person, place, and time. She appears well-developed and well-nourished.  HENT:  Head: Normocephalic and atraumatic.  Neck: Normal range of motion. Neck supple.  Cardiovascular: Normal rate, regular rhythm and normal heart sounds.   Respiratory: Effort normal and breath sounds normal.  GI: Soft. There is no rebound and no guarding.  Discomfort  in the right upper quadrant to palpation. No rigidity noted.  Neurological: She is alert and oriented to person, place, and time.  Skin: Skin is warm and dry.     Assessment/Plan Impression: Biliary colic, cholelithiasis, elevated liver enzyme tests Plan: Theresa [name] will undergo prolapse scopic cholecystectomy with liver biopsy on 11/04/2014. Risks and benefits of the procedure including bleeding, infection, hepatobiliary injury, the possibility of an open procedure were fully explained to the Theresa [name], who gave informed  consent.  Nihar Klus A 11/02/2014, 12:49 PM

## 2014-11-03 ENCOUNTER — Telehealth (INDEPENDENT_AMBULATORY_CARE_PROVIDER_SITE_OTHER): Payer: Self-pay | Admitting: *Deleted

## 2014-11-03 ENCOUNTER — Encounter (HOSPITAL_COMMUNITY)
Admission: RE | Admit: 2014-11-03 | Discharge: 2014-11-03 | Disposition: A | Discharge status: Home / Self Care | Payer: 59 | Source: Ambulatory Visit | Attending: General Surgery | Admitting: General Surgery

## 2014-11-03 ENCOUNTER — Encounter (HOSPITAL_COMMUNITY): Payer: Self-pay

## 2014-11-03 DIAGNOSIS — Z6833 Body mass index (BMI) 33.0-33.9, adult: Secondary | ICD-10-CM | POA: Diagnosis not present

## 2014-11-03 DIAGNOSIS — E785 Hyperlipidemia, unspecified: Secondary | ICD-10-CM | POA: Diagnosis not present

## 2014-11-03 DIAGNOSIS — K8020 Calculus of gallbladder without cholecystitis without obstruction: Secondary | ICD-10-CM | POA: Diagnosis present

## 2014-11-03 DIAGNOSIS — D721 Eosinophilia, unspecified: Secondary | ICD-10-CM

## 2014-11-03 DIAGNOSIS — Z87442 Personal history of urinary calculi: Secondary | ICD-10-CM | POA: Diagnosis not present

## 2014-11-03 DIAGNOSIS — K8010 Calculus of gallbladder with chronic cholecystitis without obstruction: Secondary | ICD-10-CM | POA: Diagnosis not present

## 2014-11-03 DIAGNOSIS — K219 Gastro-esophageal reflux disease without esophagitis: Secondary | ICD-10-CM | POA: Diagnosis not present

## 2014-11-03 DIAGNOSIS — E669 Obesity, unspecified: Secondary | ICD-10-CM | POA: Diagnosis not present

## 2014-11-03 DIAGNOSIS — F419 Anxiety disorder, unspecified: Secondary | ICD-10-CM | POA: Diagnosis not present

## 2014-11-03 DIAGNOSIS — N189 Chronic kidney disease, unspecified: Secondary | ICD-10-CM | POA: Diagnosis not present

## 2014-11-03 DIAGNOSIS — F1099 Alcohol use, unspecified with unspecified alcohol-induced disorder: Secondary | ICD-10-CM | POA: Diagnosis not present

## 2014-11-03 DIAGNOSIS — K760 Fatty (change of) liver, not elsewhere classified: Secondary | ICD-10-CM | POA: Diagnosis not present

## 2014-11-03 DIAGNOSIS — F329 Major depressive disorder, single episode, unspecified: Secondary | ICD-10-CM | POA: Diagnosis not present

## 2014-11-03 DIAGNOSIS — D649 Anemia, unspecified: Secondary | ICD-10-CM

## 2014-11-03 LAB — BASIC METABOLIC PANEL
Anion gap: 8 (ref 5–15)
BUN: 12 mg/dL (ref 6–23)
CO2: 26 mmol/L (ref 19–32)
Calcium: 9.1 mg/dL (ref 8.4–10.5)
Chloride: 104 mmol/L (ref 96–112)
Creatinine, Ser: 0.75 mg/dL (ref 0.50–1.10)
GFR calc Af Amer: >90 mL/min (ref >90)
GFR calc non Af Amer: >90 mL/min (ref >90)
GLUCOSE: 97 mg/dL (ref 70–99)
Potassium: 4.2 mmol/L (ref 3.5–5.1)
Sodium: 138 mmol/L (ref 135–145)

## 2014-11-03 LAB — HCG, SERUM, QUALITATIVE: Preg, Serum: NEGATIVE

## 2014-11-03 NOTE — Telephone Encounter (Signed)
Per Dr.Rehman the patient will need to have labs drawn in 1 month. 

## 2014-11-04 ENCOUNTER — Encounter (HOSPITAL_COMMUNITY)
Admission: RE | Disposition: A | Discharge status: Home / Self Care | Payer: Self-pay | Source: Ambulatory Visit | Attending: General Surgery

## 2014-11-04 ENCOUNTER — Ambulatory Visit (HOSPITAL_COMMUNITY)
Admission: RE | Admit: 2014-11-04 | Discharge: 2014-11-04 | Disposition: A | Discharge status: Home / Self Care | Payer: 59 | Source: Ambulatory Visit | Attending: General Surgery | Admitting: General Surgery

## 2014-11-04 ENCOUNTER — Encounter (HOSPITAL_COMMUNITY): Payer: Self-pay | Admitting: *Deleted

## 2014-11-04 ENCOUNTER — Ambulatory Visit (HOSPITAL_COMMUNITY): Payer: 59 | Admitting: Anesthesiology

## 2014-11-04 DIAGNOSIS — K8010 Calculus of gallbladder with chronic cholecystitis without obstruction: Secondary | ICD-10-CM | POA: Diagnosis not present

## 2014-11-04 DIAGNOSIS — K219 Gastro-esophageal reflux disease without esophagitis: Secondary | ICD-10-CM | POA: Insufficient documentation

## 2014-11-04 DIAGNOSIS — F1099 Alcohol use, unspecified with unspecified alcohol-induced disorder: Secondary | ICD-10-CM | POA: Insufficient documentation

## 2014-11-04 DIAGNOSIS — Z6833 Body mass index (BMI) 33.0-33.9, adult: Secondary | ICD-10-CM | POA: Insufficient documentation

## 2014-11-04 DIAGNOSIS — E785 Hyperlipidemia, unspecified: Secondary | ICD-10-CM | POA: Insufficient documentation

## 2014-11-04 DIAGNOSIS — N189 Chronic kidney disease, unspecified: Secondary | ICD-10-CM | POA: Insufficient documentation

## 2014-11-04 DIAGNOSIS — F329 Major depressive disorder, single episode, unspecified: Secondary | ICD-10-CM | POA: Insufficient documentation

## 2014-11-04 DIAGNOSIS — E669 Obesity, unspecified: Secondary | ICD-10-CM | POA: Insufficient documentation

## 2014-11-04 DIAGNOSIS — K760 Fatty (change of) liver, not elsewhere classified: Secondary | ICD-10-CM | POA: Insufficient documentation

## 2014-11-04 DIAGNOSIS — F419 Anxiety disorder, unspecified: Secondary | ICD-10-CM | POA: Insufficient documentation

## 2014-11-04 DIAGNOSIS — Z87442 Personal history of urinary calculi: Secondary | ICD-10-CM | POA: Insufficient documentation

## 2014-11-04 HISTORY — PX: CHOLECYSTECTOMY: SHX55

## 2014-11-04 HISTORY — PX: LIVER BIOPSY: SHX301

## 2014-11-04 LAB — GLUCOSE, CAPILLARY
GLUCOSE-CAPILLARY: 83 mg/dL (ref 70–99)
GLUCOSE-CAPILLARY: 159 mg/dL — AB (ref 70–99)

## 2014-11-04 SURGERY — LAPAROSCOPIC CHOLECYSTECTOMY
Anesthesia: General | Site: Abdomen

## 2014-11-04 MED ORDER — GLYCOPYRROLATE 0.2 MG/ML IJ SOLN
INTRAMUSCULAR | Status: AC
  Filled 2014-11-04: qty 1

## 2014-11-04 MED ORDER — MIDAZOLAM HCL 2 MG/2ML IJ SOLN
1.0000 mg | INTRAMUSCULAR | Status: AC | PRN
Start: 2014-11-04 — End: 2014-11-04
  Administered 2014-11-04 (×3): 2 mg via INTRAVENOUS
  Filled 2014-11-04: qty 2

## 2014-11-04 MED ORDER — PROPOFOL 10 MG/ML IV BOLUS
INTRAVENOUS | Status: DC | PRN
  Administered 2014-11-04: 180 mg via INTRAVENOUS

## 2014-11-04 MED ORDER — ROCURONIUM BROMIDE 50 MG/5ML IV SOLN
INTRAVENOUS | Status: AC
  Filled 2014-11-04: qty 1

## 2014-11-04 MED ORDER — KETOROLAC TROMETHAMINE 30 MG/ML IJ SOLN
30.0000 mg | Freq: Once | INTRAMUSCULAR | Status: AC
  Administered 2014-11-04: 30 mg via INTRAVENOUS

## 2014-11-04 MED ORDER — FENTANYL CITRATE 0.05 MG/ML IJ SOLN
INTRAMUSCULAR | Status: AC
  Filled 2014-11-04: qty 5

## 2014-11-04 MED ORDER — NEOSTIGMINE METHYLSULFATE 10 MG/10ML IV SOLN
INTRAVENOUS | Status: DC | PRN
  Administered 2014-11-04: 4 mg via INTRAVENOUS

## 2014-11-04 MED ORDER — ROCURONIUM BROMIDE 100 MG/10ML IV SOLN
INTRAVENOUS | Status: DC | PRN
  Administered 2014-11-04: 40 mg via INTRAVENOUS

## 2014-11-04 MED ORDER — MIDAZOLAM HCL 2 MG/2ML IJ SOLN
INTRAMUSCULAR | Status: AC
  Filled 2014-11-04: qty 2

## 2014-11-04 MED ORDER — LIDOCAINE HCL (PF) 1 % IJ SOLN
INTRAMUSCULAR | Status: AC
  Filled 2014-11-04: qty 5

## 2014-11-04 MED ORDER — DEXAMETHASONE SODIUM PHOSPHATE 4 MG/ML IJ SOLN
4.0000 mg | Freq: Once | INTRAMUSCULAR | Status: AC
  Administered 2014-11-04: 4 mg via INTRAVENOUS

## 2014-11-04 MED ORDER — FENTANYL CITRATE 0.05 MG/ML IJ SOLN
25.0000 ug | INTRAMUSCULAR | Status: DC | PRN
  Administered 2014-11-04 (×2): 50 ug via INTRAVENOUS

## 2014-11-04 MED ORDER — HEMOSTATIC AGENTS (NO CHARGE) OPTIME
TOPICAL | Status: DC | PRN
  Administered 2014-11-04: 1 via TOPICAL

## 2014-11-04 MED ORDER — SUCCINYLCHOLINE CHLORIDE 20 MG/ML IJ SOLN
INTRAMUSCULAR | Status: AC
  Filled 2014-11-04: qty 1

## 2014-11-04 MED ORDER — FENTANYL CITRATE 0.05 MG/ML IJ SOLN
INTRAMUSCULAR | Status: AC
  Filled 2014-11-04: qty 2

## 2014-11-04 MED ORDER — GLYCOPYRROLATE 0.2 MG/ML IJ SOLN
INTRAMUSCULAR | Status: AC
  Filled 2014-11-04: qty 3

## 2014-11-04 MED ORDER — CIPROFLOXACIN IN D5W 400 MG/200ML IV SOLN
400.0000 mg | INTRAVENOUS | Status: AC
  Administered 2014-11-04: 400 mg via INTRAVENOUS

## 2014-11-04 MED ORDER — BUPIVACAINE HCL (PF) 0.5 % IJ SOLN
INTRAMUSCULAR | Status: AC
  Filled 2014-11-04: qty 30

## 2014-11-04 MED ORDER — ONDANSETRON HCL 4 MG/2ML IJ SOLN
4.0000 mg | Freq: Once | INTRAMUSCULAR | Status: AC
  Administered 2014-11-04: 4 mg via INTRAVENOUS

## 2014-11-04 MED ORDER — KETOROLAC TROMETHAMINE 30 MG/ML IJ SOLN
INTRAMUSCULAR | Status: AC
  Filled 2014-11-04: qty 1

## 2014-11-04 MED ORDER — BUPIVACAINE HCL (PF) 0.5 % IJ SOLN
INTRAMUSCULAR | Status: DC | PRN
  Administered 2014-11-04: 10 mL

## 2014-11-04 MED ORDER — ONDANSETRON HCL 4 MG/2ML IJ SOLN
4.0000 mg | Freq: Once | INTRAMUSCULAR | Status: AC | PRN
  Administered 2014-11-04: 4 mg via INTRAVENOUS
  Filled 2014-11-04: qty 2

## 2014-11-04 MED ORDER — LIDOCAINE HCL 1 % IJ SOLN
INTRAMUSCULAR | Status: DC | PRN
  Administered 2014-11-04: 50 mg via INTRADERMAL

## 2014-11-04 MED ORDER — FENTANYL CITRATE 0.05 MG/ML IJ SOLN
INTRAMUSCULAR | Status: DC | PRN
  Administered 2014-11-04 (×8): 50 ug via INTRAVENOUS

## 2014-11-04 MED ORDER — POVIDONE-IODINE 10 % OINT PACKET
TOPICAL_OINTMENT | CUTANEOUS | Status: DC | PRN
  Administered 2014-11-04: 1 via TOPICAL

## 2014-11-04 MED ORDER — LACTATED RINGERS IV SOLN
INTRAVENOUS | Status: DC
  Administered 2014-11-04: 12:00:00 via INTRAVENOUS

## 2014-11-04 MED ORDER — OXYCODONE-ACETAMINOPHEN 7.5-325 MG PO TABS: 1.0000 | ORAL_TABLET | ORAL | Status: DC | PRN

## 2014-11-04 MED ORDER — DEXAMETHASONE SODIUM PHOSPHATE 4 MG/ML IJ SOLN
INTRAMUSCULAR | Status: AC
  Filled 2014-11-04: qty 1

## 2014-11-04 MED ORDER — SODIUM CHLORIDE 0.9 % IR SOLN
Status: DC | PRN
  Administered 2014-11-04: 1000 mL

## 2014-11-04 MED ORDER — POVIDONE-IODINE 10 % EX OINT
TOPICAL_OINTMENT | CUTANEOUS | Status: AC
  Filled 2014-11-04: qty 1

## 2014-11-04 MED ORDER — GLYCOPYRROLATE 0.2 MG/ML IJ SOLN
INTRAMUSCULAR | Status: DC | PRN
  Administered 2014-11-04: .7 mg via INTRAVENOUS

## 2014-11-04 MED ORDER — ONDANSETRON HCL 4 MG/2ML IJ SOLN
INTRAMUSCULAR | Status: AC
  Filled 2014-11-04: qty 2

## 2014-11-04 MED ORDER — CHLORHEXIDINE GLUCONATE 4 % EX LIQD: 1.0000 "application " | Freq: Once | CUTANEOUS | Status: DC

## 2014-11-04 SURGICAL SUPPLY — 55 items
APPLIER CLIP LAPSCP 10X32 DD (CLIP) ×2 IMPLANT
BAG HAMPER (MISCELLANEOUS) ×2 IMPLANT
BAG SPEC RTRVL LRG 6X4 10 (ENDOMECHANICALS) ×1
CHLORAPREP W/TINT 26ML (MISCELLANEOUS) ×2 IMPLANT
CLOTH BEACON ORANGE TIMEOUT ST (SAFETY) ×2 IMPLANT
COVER LIGHT HANDLE STERIS (MISCELLANEOUS) ×4 IMPLANT
DECANTER SPIKE VIAL GLASS SM (MISCELLANEOUS) ×2 IMPLANT
ELECT REM PT RETURN 9FT ADLT (ELECTROSURGICAL) ×2
ELECTRODE REM PT RTRN 9FT ADLT (ELECTROSURGICAL) ×1 IMPLANT
FILTER SMOKE EVAC LAPAROSHD (FILTER) ×2 IMPLANT
FORMALIN 10 PREFIL 120ML (MISCELLANEOUS) ×3 IMPLANT
GLOVE BIOGEL PI IND STRL 7.0 (GLOVE) IMPLANT
GLOVE BIOGEL PI INDICATOR 7.0 (GLOVE) ×2
GLOVE ECLIPSE 6.5 STRL STRAW (GLOVE) ×2 IMPLANT
GLOVE EXAM NITRILE LRG STRL (GLOVE) ×1 IMPLANT
GLOVE SURG SS PI 7.5 STRL IVOR (GLOVE) ×4 IMPLANT
GOWN STRL REUS W/ TWL XL LVL3 (GOWN DISPOSABLE) ×1 IMPLANT
GOWN STRL REUS W/TWL LRG LVL3 (GOWN DISPOSABLE) ×6 IMPLANT
GOWN STRL REUS W/TWL XL LVL3 (GOWN DISPOSABLE) ×2
HEMOSTAT SNOW SURGICEL 2X4 (HEMOSTASIS) ×2 IMPLANT
INST SET LAPROSCOPIC AP (KITS) ×2 IMPLANT
IV NS IRRIG 3000ML ARTHROMATIC (IV SOLUTION) IMPLANT
KIT ROOM TURNOVER APOR (KITS) ×2 IMPLANT
MANIFOLD NEPTUNE II (INSTRUMENTS) ×2 IMPLANT
NDL BIOPSY 14GX4.5 SOFT TIS (NEEDLE) IMPLANT
NDL BIOPSY 14X6 SOFT TISS (NEEDLE) IMPLANT
NDL HYPO 18GX1.5 BLUNT FILL (NEEDLE) IMPLANT
NDL HYPO 25X1 1.5 SAFETY (NEEDLE) ×1 IMPLANT
NDL INSUFFLATION 14GA 120MM (NEEDLE) ×1 IMPLANT
NEEDLE BIOPSY 14GX4.5 SOFT TIS (NEEDLE) ×2 IMPLANT
NEEDLE BIOPSY 14X6 SOFT TISS (NEEDLE) IMPLANT
NEEDLE HYPO 18GX1.5 BLUNT FILL (NEEDLE) IMPLANT
NEEDLE HYPO 25X1 1.5 SAFETY (NEEDLE) ×2 IMPLANT
NEEDLE INSUFFLATION 14GA 120MM (NEEDLE) ×2 IMPLANT
NS IRRIG 1000ML POUR BTL (IV SOLUTION) ×2 IMPLANT
PACK LAP CHOLE LZT030E (CUSTOM PROCEDURE TRAY) ×2 IMPLANT
PACK MINOR (CUSTOM PROCEDURE TRAY) ×2 IMPLANT
PAD ARMBOARD 7.5X6 YLW CONV (MISCELLANEOUS) ×2 IMPLANT
POUCH SPECIMEN RETRIEVAL 10MM (ENDOMECHANICALS) ×2 IMPLANT
SET BASIN LINEN APH (SET/KITS/TRAYS/PACK) ×2 IMPLANT
SET TUBE IRRIG SUCTION NO TIP (IRRIGATION / IRRIGATOR) IMPLANT
SLEEVE ENDOPATH XCEL 5M (ENDOMECHANICALS) ×2 IMPLANT
SPONGE GAUZE 2X2 8PLY STRL LF (GAUZE/BANDAGES/DRESSINGS) ×8 IMPLANT
STAPLER VISISTAT (STAPLE) ×2 IMPLANT
STRIP CLOSURE SKIN 1/4X3 (GAUZE/BANDAGES/DRESSINGS) ×2 IMPLANT
SUT VIC AB 4-0 PS2 27 (SUTURE) ×1 IMPLANT
SUT VICRYL 0 UR6 27IN ABS (SUTURE) ×2 IMPLANT
SYRINGE 10CC LL (SYRINGE) ×2 IMPLANT
TOWEL OR 17X26 4PK STRL BLUE (TOWEL DISPOSABLE) ×2 IMPLANT
TROCAR ENDO BLADELESS 11MM (ENDOMECHANICALS) ×2 IMPLANT
TROCAR XCEL NON-BLD 5MMX100MML (ENDOMECHANICALS) ×2 IMPLANT
TROCAR XCEL UNIV SLVE 11M 100M (ENDOMECHANICALS) ×2 IMPLANT
TUBING INSUFFLATION (TUBING) ×2 IMPLANT
WARMER LAPAROSCOPE (MISCELLANEOUS) ×2 IMPLANT
YANKAUER SUCT 12FT TUBE ARGYLE (SUCTIONS) ×2 IMPLANT

## 2014-11-04 NOTE — Transfer of Care (Signed)
Immediate Anesthesia Transfer of Care Note  Patient: Theresa [name]  Procedure(s) Performed: Procedure(s): LAPAROSCOPIC CHOLECYSTECTOMY (N/A) LIVER BIOPSY (N/A)  Patient Location: PACU  Anesthesia Type:General  Level of Consciousness: awake and oriented  Airway & Oxygen Therapy: Patient Spontanous Breathing and Patient connected to face mask oxygen  Post-op Assessment: Report given to RN  Post vital signs: Reviewed and stable  Last Vitals:  Filed Vitals:   11/04/14 1235  BP: 115/77  Pulse:   Temp:   Resp: 20    Complications: No apparent anesthesia complications

## 2014-11-04 NOTE — Anesthesia Procedure Notes (Signed)
Procedure Name: Intubation Date/Time: 11/04/2014 12:48 PM Performed by: Tressie Stalker E Pre-anesthesia Checklist: Patient identified, Patient being monitored, Timeout performed, Emergency Drugs available and Suction available Patient Re-evaluated:Patient Re-evaluated prior to inductionOxygen Delivery Method: Circle System Utilized Preoxygenation: Pre-oxygenation with 100% oxygen Intubation Type: IV induction, Rapid sequence and Cricoid Pressure applied Ventilation: Mask ventilation without difficulty Laryngoscope Size: Mac and 3 Grade View: Grade I Tube type: Oral Tube size: 7.0 mm Number of attempts: 1 Airway Equipment and Method: Stylet Placement Confirmation: ETT inserted through vocal cords under direct vision,  positive ETCO2 and breath sounds checked- equal and bilateral Secured at: 21 cm Tube secured with: Tape Dental Injury: Teeth and Oropharynx as per pre-operative assessment

## 2014-11-04 NOTE — Anesthesia Preprocedure Evaluation (Signed)
Anesthesia Evaluation  Patient identified by MRN, date of birth, ID band Patient awake    Reviewed: Allergy & Precautions, NPO status , Patient's Chart, lab work & pertinent test results  Airway Mallampati: II  TM Distance: >3 FB Neck ROM: Full    Dental  (+) Teeth Intact   Pulmonary neg pulmonary ROS,  breath sounds clear to auscultation        Cardiovascular Rhythm:Regular Rate:Normal     Neuro/Psych PSYCHIATRIC DISORDERS Anxiety Depression    GI/Hepatic GERD-  Medicated and Poorly Controlled,  Endo/Other    Renal/GU Renal disease     Musculoskeletal   Abdominal   Peds  Hematology   Anesthesia Other Findings   Reproductive/Obstetrics                             Anesthesia Physical Anesthesia Plan  ASA: II  Anesthesia Plan: General   Post-op Pain Management:    Induction: Intravenous, Rapid sequence and Cricoid pressure planned  Airway Management Planned: Oral ETT  Additional Equipment:   Intra-op Plan:   Post-operative Plan: Extubation in OR  Informed Consent: I have reviewed the patients History and Physical, chart, labs and discussed the procedure including the risks, benefits and alternatives for the proposed anesthesia with the patient or authorized representative who has indicated his/her understanding and acceptance.     Plan Discussed with:   Anesthesia Plan Comments:         Anesthesia Quick Evaluation

## 2014-11-04 NOTE — Anesthesia Postprocedure Evaluation (Signed)
  Anesthesia Post-op Note Late entry  Patient: Theresa [name]  Procedure(s) Performed: Procedure(s): LAPAROSCOPIC CHOLECYSTECTOMY (N/A) LIVER BIOPSY (N/A)  Patient Location: Short Stay  Anesthesia Type:General  Level of Consciousness: awake, alert  and oriented  Airway and Oxygen Therapy: Patient Spontanous Breathing  Post-op Pain: mild  Post-op Assessment: Post-op Vital signs reviewed, Patient's Cardiovascular Status Stable, Respiratory Function Stable, Patent Airway and No signs of Nausea or vomiting  Post-op Vital Signs: Reviewed and stable  Last Vitals:  Filed Vitals:   11/04/14 1452  BP: 137/91  Pulse: 72  Temp: 36.4 C  Resp: 18    Complications: No apparent anesthesia complications

## 2014-11-04 NOTE — Discharge Instructions (Signed)

## 2014-11-04 NOTE — Interval H&P Note (Signed)
History and Physical Interval Note:  11/04/2014 11:50 AM  Theresa [name]  has presented today for surgery, with the diagnosis of cholelithiasis  The various methods of treatment have been discussed with the patient and family. After consideration of risks, benefits and other options for treatment, the patient has consented to  Procedure(s): LAPAROSCOPIC CHOLECYSTECTOMY (N/A) LIVER BIOPSY (N/A) as a surgical intervention .  The patient's history has been reviewed, patient examined, no change in status, stable for surgery.  I have reviewed the patient's chart and labs.  Questions were answered to the patient's satisfaction.     Aviva Signs A

## 2014-11-04 NOTE — Op Note (Signed)
Patient:  Theresa [name]  DOB:  11-30-1970  MRN:  [id number]   Preop Diagnosis:  Cholelithiasis, abnormal liver  Postop Diagnosis:  Same  Procedure:  Laparoscopic cholecystectomy, liver biopsy  Surgeon:  Aviva Signs, M.D.  Anes:  Gen. endotracheal  Indications:  Patient is a 44 year old white female who presents with biliary colic secondary to cholelithiasis and a possible fatty liver. She is to undergo a laparoscopic cholecystectomy with liver biopsy. The risks and benefits of the procedure including bleeding, infection, hepatobiliary injury, the possibility of an open procedure were fully explained to the patient, who gave informed consent.  Procedure note:  Patient is placed the supine position. After induction of general endotracheal anesthesia, the abdomen was prepped and draped using usual sterile technique with DuraPrep. Surgical site confirmation was performed.  An infraumbilical incision was made down to the fascia. A Veress needle was introduced into the abdominal cavity and confirmation of placement was done using the saline drop test. The abdomen was then insufflated to 16 mmHg pressure. An 11 mm trocar was introduced into the abdominal cavity under direct visualization without difficulty. The patient was placed in reverse Trendelenburg position and additional 11 mm trocar was placed the epigastric region and 5 mm trochars were placed the right upper quadrant and right flank regions. The liver was inspected and was grossly within normal limits. A Tru-Cut needle biopsy was taken of the right lobe of the liver and sent to pathology further examination. The gallbladder was retracted in a dynamic fashion in order to expose the triangle of Calot. The cystic duct was first identified. Its junction to the infundibulum was fully identified. Endoclips placed proximally and distally on the cystic duct, and cystic duct was divided. This was likewise done cystic artery. The gallbladder was freed  away from the gallbladder fossa using Bovie electrocautery. The gallbladder was delivered through the epigastric trocar site using an Endo Catch bag. The gallbladder fossa was inspected no abnormal bleeding or bile leakage was noted. Surgicel is placed the gallbladder fossa. All fluid and air were then evacuated from the abdominal cavity prior to removal of the trochars.  All wounds were irrigated with normal saline. All wounds were injected with 0.5% Sensorcaine. The infraumbilical fashion as well as epigastric fascia were reapproximated using 0 Vicryl interrupted sutures. All skin incisions were closed using staples. Betadine ointment and dry sterile dressings were applied.  All tape and needle counts were correct at the end of the procedure. Patient was extubated in the operating room and transferred to PACU in stable condition.  Complications:  None  EBL:  Minimal  Specimen:  Gallbladder, liver biopsy

## 2014-11-05 ENCOUNTER — Encounter (HOSPITAL_COMMUNITY): Payer: Self-pay | Admitting: General Surgery

## 2014-11-11 ENCOUNTER — Encounter (INDEPENDENT_AMBULATORY_CARE_PROVIDER_SITE_OTHER): Payer: Self-pay | Admitting: *Deleted

## 2014-11-11 ENCOUNTER — Other Ambulatory Visit (INDEPENDENT_AMBULATORY_CARE_PROVIDER_SITE_OTHER): Payer: Self-pay | Admitting: *Deleted

## 2014-11-11 DIAGNOSIS — D649 Anemia, unspecified: Secondary | ICD-10-CM

## 2014-11-11 DIAGNOSIS — D721 Eosinophilia, unspecified: Secondary | ICD-10-CM

## 2014-11-15 ENCOUNTER — Encounter: Payer: Self-pay | Admitting: Family Medicine

## 2014-11-15 DIAGNOSIS — R1011 Right upper quadrant pain: Secondary | ICD-10-CM

## 2014-11-18 ENCOUNTER — Ambulatory Visit (INDEPENDENT_AMBULATORY_CARE_PROVIDER_SITE_OTHER): Payer: 59 | Admitting: Internal Medicine

## 2014-11-18 DIAGNOSIS — R1110 Vomiting, unspecified: Secondary | ICD-10-CM

## 2014-11-20 ENCOUNTER — Other Ambulatory Visit (INDEPENDENT_AMBULATORY_CARE_PROVIDER_SITE_OTHER): Payer: Self-pay | Admitting: Internal Medicine

## 2014-11-20 ENCOUNTER — Other Ambulatory Visit (HOSPITAL_COMMUNITY)
Admission: RE | Admit: 2014-11-20 | Discharge: 2014-11-20 | Disposition: A | Discharge status: Home / Self Care | Payer: 59 | Source: Ambulatory Visit | Attending: Internal Medicine | Admitting: Internal Medicine

## 2014-11-20 DIAGNOSIS — R1011 Right upper quadrant pain: Secondary | ICD-10-CM | POA: Diagnosis present

## 2014-11-20 LAB — HEPATIC FUNCTION PANEL
ALK PHOS: 87 U/L (ref 39–117)
ALT: 26 U/L (ref 0–35)
ALT: 28 U/L (ref 0–35)
AST: 29 U/L (ref 0–37)
AST: 30 U/L (ref 0–37)
Albumin: 4.3 g/dL (ref 3.5–5.2)
Albumin: 4.4 g/dL (ref 3.5–5.2)
Alkaline Phosphatase: 79 U/L (ref 39–117)
BILIRUBIN TOTAL: 0.6 mg/dL (ref 0.3–1.2)
Bilirubin, Direct: 0.1 mg/dL (ref 0.0–0.5)
Indirect Bilirubin: 0.5 mg/dL (ref 0.3–0.9)
TOTAL PROTEIN: 7.6 g/dL (ref 6.0–8.3)
Total Bilirubin: 0.6 mg/dL (ref 0.3–1.2)
Total Protein: 7.7 g/dL (ref 6.0–8.3)

## 2014-11-20 NOTE — Telephone Encounter (Signed)
Patient called this morning to let me that she had right upper quadrant pain last night pain that she has had prior to cholecystectomy. She ate pork chops and macaroni cheese last night. Will check LFTs.

## 2014-12-14 MED ORDER — SUCRALFATE 1 G PO TABS: 2.0000 g | ORAL_TABLET | Freq: Every day | ORAL | Status: DC

## 2014-12-14 NOTE — Progress Notes (Signed)
I talked with patient at the hospital earlier today. Complains of frequent nausea and at least 3-4 episodes of vomiting per week. She is on bland diet. She also feels as if her food stays in stomach for long. She is also having intermittent heartburn despite taking Dexilant. He is also taking OTC Zantac. She is not having epigastric pain that she was having prior to cholecystectomy.  Plan; Patient advised to take Dexilant daily before breakfast and Zantac OTC 150 mg in the evening. Gravida 2 g by mouth daily at bedtime. Schedule patient for EGD and if EGD is negative and proceed with solid-phase gastric emptying study.

## 2014-12-15 ENCOUNTER — Telehealth (INDEPENDENT_AMBULATORY_CARE_PROVIDER_SITE_OTHER): Payer: Self-pay | Admitting: *Deleted

## 2014-12-15 ENCOUNTER — Other Ambulatory Visit (INDEPENDENT_AMBULATORY_CARE_PROVIDER_SITE_OTHER): Payer: Self-pay | Admitting: *Deleted

## 2014-12-15 DIAGNOSIS — R110 Nausea: Secondary | ICD-10-CM

## 2014-12-15 NOTE — Telephone Encounter (Signed)
Talked with the patient. She is having horrible Nausea, she has talked with Dr.Rehman. They were going to wait until she tried the Carafate , but she feels that she should move forward with it. After 3:30 pm the patient may be reached on her cell number.  Per Dr.Rehman -arrange for EGD to be done.

## 2014-12-15 NOTE — Telephone Encounter (Signed)
EGD sch'd 12/18/14, patient aware

## 2014-12-18 ENCOUNTER — Encounter (HOSPITAL_COMMUNITY): Payer: Self-pay | Admitting: Emergency Medicine

## 2014-12-18 ENCOUNTER — Ambulatory Visit (HOSPITAL_COMMUNITY)
Admission: RE | Admit: 2014-12-18 | Discharge: 2014-12-18 | Disposition: A | Discharge status: Home / Self Care | Payer: 59 | Source: Ambulatory Visit | Attending: Internal Medicine | Admitting: Internal Medicine

## 2014-12-18 ENCOUNTER — Encounter (HOSPITAL_COMMUNITY)
Admission: RE | Disposition: A | Discharge status: Home / Self Care | Payer: Self-pay | Source: Ambulatory Visit | Attending: Internal Medicine

## 2014-12-18 DIAGNOSIS — K449 Diaphragmatic hernia without obstruction or gangrene: Secondary | ICD-10-CM | POA: Insufficient documentation

## 2014-12-18 DIAGNOSIS — Z87442 Personal history of urinary calculi: Secondary | ICD-10-CM | POA: Insufficient documentation

## 2014-12-18 DIAGNOSIS — K2950 Unspecified chronic gastritis without bleeding: Secondary | ICD-10-CM | POA: Diagnosis not present

## 2014-12-18 DIAGNOSIS — F329 Major depressive disorder, single episode, unspecified: Secondary | ICD-10-CM | POA: Diagnosis not present

## 2014-12-18 DIAGNOSIS — R110 Nausea: Secondary | ICD-10-CM

## 2014-12-18 DIAGNOSIS — E785 Hyperlipidemia, unspecified: Secondary | ICD-10-CM | POA: Diagnosis not present

## 2014-12-18 DIAGNOSIS — Z9049 Acquired absence of other specified parts of digestive tract: Secondary | ICD-10-CM | POA: Diagnosis not present

## 2014-12-18 DIAGNOSIS — K219 Gastro-esophageal reflux disease without esophagitis: Secondary | ICD-10-CM | POA: Diagnosis present

## 2014-12-18 DIAGNOSIS — K317 Polyp of stomach and duodenum: Secondary | ICD-10-CM | POA: Insufficient documentation

## 2014-12-18 DIAGNOSIS — F419 Anxiety disorder, unspecified: Secondary | ICD-10-CM | POA: Diagnosis not present

## 2014-12-18 DIAGNOSIS — E669 Obesity, unspecified: Secondary | ICD-10-CM | POA: Diagnosis not present

## 2014-12-18 DIAGNOSIS — R112 Nausea with vomiting, unspecified: Secondary | ICD-10-CM | POA: Diagnosis not present

## 2014-12-18 DIAGNOSIS — R1013 Epigastric pain: Secondary | ICD-10-CM

## 2014-12-18 DIAGNOSIS — G4700 Insomnia, unspecified: Secondary | ICD-10-CM | POA: Insufficient documentation

## 2014-12-18 HISTORY — PX: ESOPHAGOGASTRODUODENOSCOPY: SHX5428

## 2014-12-18 LAB — COMPREHENSIVE METABOLIC PANEL
ALT: 17 U/L (ref 14–54)
ANION GAP: 7 (ref 5–15)
AST: 18 U/L (ref 15–41)
Albumin: 3.7 g/dL (ref 3.5–5.0)
Alkaline Phosphatase: 72 U/L (ref 38–126)
BILIRUBIN TOTAL: 0.7 mg/dL (ref 0.3–1.2)
BUN: 10 mg/dL (ref 6–20)
CHLORIDE: 107 mmol/L (ref 101–111)
CO2: 25 mmol/L (ref 22–32)
CREATININE: 0.82 mg/dL (ref 0.44–1.00)
Calcium: 8.2 mg/dL — ABNORMAL LOW (ref 8.9–10.3)
GFR calc Af Amer: >60 mL/min (ref >60)
GFR calc non Af Amer: >60 mL/min (ref >60)
GLUCOSE: 91 mg/dL (ref 70–99)
Potassium: 3.8 mmol/L (ref 3.5–5.1)
Sodium: 139 mmol/L (ref 135–145)
Total Protein: 6.7 g/dL (ref 6.5–8.1)

## 2014-12-18 LAB — CBC
HCT: 37.4 % (ref 36.0–46.0)
Hemoglobin: 12.3 g/dL (ref 12.0–15.0)
MCH: 28.8 pg (ref 26.0–34.0)
MCHC: 32.9 g/dL (ref 30.0–36.0)
MCV: 87.6 fL (ref 78.0–100.0)
Platelets: 253 10*3/uL (ref 150–400)
RBC: 4.27 MIL/uL (ref 3.87–5.11)
RDW: 12.8 % (ref 11.5–15.5)
WBC: 7.1 10*3/uL (ref 4.0–10.5)

## 2014-12-18 SURGERY — EGD (ESOPHAGOGASTRODUODENOSCOPY)
Anesthesia: Moderate Sedation

## 2014-12-18 MED ORDER — STERILE WATER FOR IRRIGATION IR SOLN
Status: DC | PRN
  Administered 2014-12-18: 15:00:00

## 2014-12-18 MED ORDER — ONDANSETRON HCL 4 MG PO TABS: 4.0000 mg | ORAL_TABLET | Freq: Three times a day (TID) | ORAL | Status: DC | PRN

## 2014-12-18 MED ORDER — BUTAMBEN-TETRACAINE-BENZOCAINE 2-2-14 % EX AERO
INHALATION_SPRAY | CUTANEOUS | Status: DC | PRN
  Administered 2014-12-18: 2 via TOPICAL

## 2014-12-18 MED ORDER — BUTAMBEN-TETRACAINE-BENZOCAINE 2-2-14 % EX AERO
INHALATION_SPRAY | CUTANEOUS | Status: AC
  Filled 2014-12-18: qty 56

## 2014-12-18 MED ORDER — MEPERIDINE HCL 50 MG/ML IJ SOLN
INTRAMUSCULAR | Status: DC | PRN
  Administered 2014-12-18 (×2): 25 mg via INTRAVENOUS

## 2014-12-18 MED ORDER — MIDAZOLAM HCL 5 MG/5ML IJ SOLN
INTRAMUSCULAR | Status: AC
  Filled 2014-12-18: qty 10

## 2014-12-18 MED ORDER — MIDAZOLAM HCL 5 MG/5ML IJ SOLN
INTRAMUSCULAR | Status: AC
  Filled 2014-12-18: qty 5

## 2014-12-18 MED ORDER — SODIUM CHLORIDE 0.9 % IV SOLN
INTRAVENOUS | Status: DC
  Administered 2014-12-18: 20 mL/h via INTRAVENOUS

## 2014-12-18 MED ORDER — MEPERIDINE HCL 50 MG/ML IJ SOLN
INTRAMUSCULAR | Status: AC
  Filled 2014-12-18: qty 1

## 2014-12-18 MED ORDER — MIDAZOLAM HCL 5 MG/5ML IJ SOLN
INTRAMUSCULAR | Status: DC | PRN
  Administered 2014-12-18 (×3): 3 mg via INTRAVENOUS
  Administered 2014-12-18 (×3): 2 mg via INTRAVENOUS

## 2014-12-18 NOTE — H&P (Signed)
Theresa [name] is an 44 y.o. female.   Chief Complaint: Patient is here for EGD. 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 She feels much better when she does not eat. She also feels food stays in her esophagus. She's had difficulty swallowing big pills and occasionally with solids. She has lost 11 pounds in the last few weeks. She had cholecystectomy about 6 weeks ago for symptomatic cholelithiasis. She describes this pain to be different pain. She says her symptoms have gotten worse over the last few days. He also complains of extreme fatigue. She is scheduled to be seen by a rheumatologist. She denies melena or rectal bleeding. Her hemoglobin has dropped from over 14 g in February to 11.8 g in March.  Past Medical History  Diagnosis Date  . IBS (irritable bowel syndrome)     constipation  . GERD (gastroesophageal reflux disease)   . Hyperlipidemia   . Chronic kidney disease     kidney stones  . Insomnia   . Anxiety   . Depression   . HPV (human papilloma virus) infection   . Colon adenoma DEC 2014    ONE(7 MM) SIMPLE([place])  . Vaginal discharge 09/04/2013  . Yeast infection 09/04/2013  . Vaginal Pap smear, abnormal   . History of abnormal cervical Pap smear 12/02/2013  . Rash 12/02/2013  . Body aches 12/02/2013  . Obesity 01/14/2014  . Edema   . Pre-diabetes 09/2014    Past Surgical History  Procedure Laterality Date  . Breast reduction surgery  2009  . Esophagogastroduodenoscopy  Dec 2009    Dr. Carlean Purl: reflux esophagitis, proximal gastric polyps, benign  . Colonoscopy  Oct 2009    Dr. Carlean Purl: int/ext hemorrhoids, ?mild proctitis but path benign, likely r/t irritation from bowel prep  . Breast surgery  2007    breast reduction  . Colonoscopy N/A 08/01/2013    Procedure: COLONOSCOPY;  Surgeon: Danie Binder, MD;  Location: [facility];  Service: Endoscopy;  Laterality: N/A;  12:00  . Hemorrhoid  banding N/A 08/01/2013    Procedure: HEMORRHOID BANDING;  Surgeon: Danie Binder, MD;  Location: [facility];  Service: Endoscopy;  Laterality: N/A;  internal hemorrhoid banding  . Esophageal biopsy N/A 08/01/2013    Procedure: BIOPSY;  Surgeon: Danie Binder, MD;  Location: [facility];  Service: Endoscopy;  Laterality: N/A;  FOR MICROSCOPIC COLITIS  . Upper gastrointestinal endoscopy  2006    Dr. Nita Sickle in [place]  . Polypectomy  2015    Dr.Fields  . Cholecystectomy N/A 11/04/2014    Procedure: LAPAROSCOPIC CHOLECYSTECTOMY;  Surgeon: Aviva Signs Md, MD;  Location: [facility];  Service: General;  Laterality: N/A;  . Liver biopsy N/A 11/04/2014    Procedure: LIVER BIOPSY;  Surgeon: Aviva Signs Md, MD;  Location: [facility];  Service: General;  Laterality: N/A;    Family History  Problem Relation Age of Onset  . Colon cancer Neg Hx   . Cancer Mother     head and neck  . Hypertension Father   . Hyperlipidemia Father   . Pancreatic cancer Maternal Grandfather    Social History:  reports that she has never smoked. She has never used smokeless tobacco. She reports that she drinks alcohol. She reports that she does not use illicit drugs.  Allergies: No Known Allergies  Medications Prior to Admission  Medication Sig Dispense Refill  . ranitidine (ZANTAC) 150 MG capsule Take  150 mg by mouth 2 (two) times daily.    Marland Kitchen ALPRAZolam (XANAX) 1 MG tablet TAKE 1 TABLET BY MOUTH EVERY 12 HOURS AS NEEDED FOR ANXIETY 60 tablet 0  . citalopram (CELEXA) 40 MG tablet Take 1 tablet (40 mg total) by mouth daily. 30 tablet 0  . cycloSPORINE (RESTASIS) 0.05 % ophthalmic emulsion Place 1 drop into both eyes 2 (two) times daily.    Marland Kitchen dexlansoprazole (DEXILANT) 60 MG capsule Take 1 capsule (60 mg total) by mouth daily. 90 capsule 3  . furosemide (LASIX) 40 MG tablet Take 40 mg by mouth daily.    Marland Kitchen levonorgestrel (MIRENA) 20 MCG/24HR IUD 1 each by Intrauterine route once.    Marland Kitchen oxyCODONE-acetaminophen  (PERCOCET) 7.5-325 MG per tablet Take 1-2 tablets by mouth every 4 (four) hours as needed. 50 tablet 0  . potassium chloride SA (K-DUR,KLOR-CON) 20 MEQ tablet Take 1 tablet (20 mEq total) by mouth 2 (two) times daily. 20 tablet 0  . sucralfate (CARAFATE) 1 G tablet Take 2 tablets (2 g total) by mouth at bedtime. 60 tablet 2  . valACYclovir (VALTREX) 1000 MG tablet Take 2 now and 2 in am 10 tablet 1    No results found for this or any previous visit (from the past 48 hour(s)). No results found.  ROS  Blood pressure 140/84, pulse 80, resp. rate 15, SpO2 100 %. Physical Exam  Constitutional: She appears well-developed and well-nourished.  HENT:  Mouth/Throat: Oropharynx is clear and moist.  Eyes: Conjunctivae are normal. No scleral icterus.  Neck: No thyromegaly present.  Cardiovascular: Normal rate, regular rhythm and normal heart sounds.   No murmur heard. Respiratory: Effort normal and breath sounds normal.  GI: Soft.  Mild midepigastric tenderness.  Lymphadenopathy:    She has no cervical adenopathy.     Assessment/Plan Nausea vomiting and epigastric pain. Diagnostic EGD.  Theresa [name] U 12/18/2014, 2:59 PM

## 2014-12-18 NOTE — Telephone Encounter (Signed)
PT I NOW PT OF DR. Olevia Perches. SHE WILL; NEED TO OBTAIN SAMPLES FROM HIS OFC.

## 2014-12-18 NOTE — Op Note (Signed)
EGD PROCEDURE REPORT  PATIENT:  Theresa [name]  MR#:  [id number] Birthdate:  24-Jun-1971, 44 y.o., female Endoscopist:  Dr. Rogene Houston, MD Referred By:  Dr. Rayne Du ref. provider found Procedure Date: 12/18/2014  Procedure:   EGD  Indications:  Patient is 44 year old Caucasian female was chronic GERD and maintained on PPI and ranitidine who presents with upper abdominal pain nausea and vomiting. She had cholecystectomy about 6 weeks ago with resolution of right upper quadrant abdominal pain. She denies hematemesis melena or rectal bleeding. Hemoglobin has dropped in February and March this year.         Informed Consent:  The risks, benefits, alternatives & imponderables which include, but are not limited to, bleeding, infection, perforation, drug reaction and potential missed lesion have been reviewed.  The potential for biopsy, lesion removal, esophageal dilation, etc. have also been discussed.  Questions have been answered.  All parties agreeable.  Please see history & physical in medical record for more information.  Medications:  Demerol 50 mg IV Versed 15 mg IV Cetacaine spray topically for oropharyngeal anesthesia  Description of procedure:  The endoscope was introduced through the mouth and advanced to the second portion of the duodenum without difficulty or limitations. The mucosal surfaces were surveyed very carefully during advancement of the scope and upon withdrawal.  Findings:  Esophagus:   mucosa of the esophagus was normal. GE junction was unremarkable. GEJ:  35 cm Hiatus:  37 cm and wide open Stomach:   stomach was empty and distended very well with insufflation. Folds in the proximal stomach were normal. Scattered small hyperplastic appearing polyps noted at gastric body and fundus. Linear streaks of erythematous mucosa noted at antrum with few erosions. Pyloric channel was patent. Mucosa and cardia was normal. Duodenum:   normal bulbar and post bulbar  mucosa.  Therapeutic/Diagnostic Maneuvers Performed: None  Complications:  None  Impression: Small sliding hiatal hernia with wide-open hiatus. Multiple small polyps at gastric body and fundus. Biopsy was not obtained since she had biopsy back in December 2009 revealing fundic gland polyps. Antral gastritis.  Comment; These findings would not explain patient's acute on chronic symptoms unless she has developed acute recent gastroenteritis.  Recommendations:  Ondansetron 4 mg by mouth 3 times a day when necessary. Will check CBC, comprehensive chemistry panel and H. pylori antibody today. Further recommendations to follow.  REHMAN,NAJEEB U  12/18/2014  3:36 PM  CC: Dr. Alonza Bogus, MD & Dr. Rayne Du ref. provider found

## 2014-12-18 NOTE — Discharge Instructions (Signed)
Resume usual medications and diet. Ondansetron 4 mg 3 times a day when necessary. Physician will call with results of blood tests. No driving for 24 hours.  Esophagogastroduodenoscopy Care After Refer to this sheet in the next few weeks. These instructions provide you with information on caring for yourself after your procedure. Your caregiver may also give you more specific instructions. Your treatment has been planned according to current medical practices, but problems sometimes occur. Call your caregiver if you have any problems or questions after your procedure.  HOME CARE INSTRUCTIONS  Do not eat or drink anything until the numbing medicine (local anesthetic) has worn off and your gag reflex has returned. You will know that the local anesthetic has worn off when you can swallow comfortably.  Do not drive for 12 hours after the procedure or as directed by your caregiver.  Only take medicines as directed by your caregiver. SEEK MEDICAL CARE IF:   You cannot stop coughing.  You are not urinating at all or less than usual. SEEK IMMEDIATE MEDICAL CARE IF:  You have difficulty swallowing.  You cannot eat or drink.  You have worsening throat or chest pain.  You have dizziness, lightheadedness, or you faint.  You have nausea or vomiting.  You have chills.  You have a fever.  You have severe abdominal pain.  You have black, tarry, or bloody stools. Document Released: 07/17/2012 Document Reviewed: 07/17/2012 [facility] Patient Information 2015 [place]. This information is not intended to replace advice given to you by your health care provider. Make sure you discuss any questions you have with your health care provider.

## 2014-12-21 ENCOUNTER — Encounter (HOSPITAL_COMMUNITY): Payer: Self-pay | Admitting: Internal Medicine

## 2014-12-21 NOTE — Telephone Encounter (Signed)
Noted  

## 2014-12-22 ENCOUNTER — Encounter (INDEPENDENT_AMBULATORY_CARE_PROVIDER_SITE_OTHER): Payer: Self-pay | Admitting: Internal Medicine

## 2014-12-22 LAB — H. PYLORI ANTIBODY, IGG: H Pylori IgG: <0.9 U/mL (ref 0.0–0.8)

## 2014-12-25 ENCOUNTER — Other Ambulatory Visit (INDEPENDENT_AMBULATORY_CARE_PROVIDER_SITE_OTHER): Payer: Self-pay | Admitting: Internal Medicine

## 2014-12-25 DIAGNOSIS — R1111 Vomiting without nausea: Secondary | ICD-10-CM

## 2014-12-30 ENCOUNTER — Ambulatory Visit (HOSPITAL_COMMUNITY)
Admission: RE | Admit: 2014-12-30 | Discharge: 2014-12-30 | Disposition: A | Discharge status: Home / Self Care | Payer: 59 | Source: Ambulatory Visit | Attending: Pulmonary Disease | Admitting: Pulmonary Disease

## 2014-12-30 ENCOUNTER — Encounter (HOSPITAL_COMMUNITY): Payer: Self-pay

## 2014-12-30 ENCOUNTER — Encounter (INDEPENDENT_AMBULATORY_CARE_PROVIDER_SITE_OTHER): Payer: Self-pay | Admitting: Internal Medicine

## 2014-12-30 ENCOUNTER — Other Ambulatory Visit (HOSPITAL_COMMUNITY): Payer: Self-pay | Admitting: Pulmonary Disease

## 2014-12-30 DIAGNOSIS — R109 Unspecified abdominal pain: Secondary | ICD-10-CM | POA: Insufficient documentation

## 2014-12-30 DIAGNOSIS — Z9049 Acquired absence of other specified parts of digestive tract: Secondary | ICD-10-CM | POA: Diagnosis not present

## 2014-12-30 DIAGNOSIS — N200 Calculus of kidney: Secondary | ICD-10-CM | POA: Diagnosis not present

## 2014-12-30 DIAGNOSIS — R1032 Left lower quadrant pain: Secondary | ICD-10-CM

## 2014-12-30 DIAGNOSIS — R932 Abnormal findings on diagnostic imaging of liver and biliary tract: Secondary | ICD-10-CM | POA: Diagnosis not present

## 2014-12-30 DIAGNOSIS — R112 Nausea with vomiting, unspecified: Secondary | ICD-10-CM

## 2014-12-30 DIAGNOSIS — R197 Diarrhea, unspecified: Secondary | ICD-10-CM | POA: Diagnosis not present

## 2014-12-30 MED ORDER — IOHEXOL 300 MG/ML  SOLN
100.0000 mL | Freq: Once | INTRAMUSCULAR | Status: AC | PRN
  Administered 2014-12-30: 100 mL via INTRAVENOUS

## 2014-12-31 ENCOUNTER — Other Ambulatory Visit (HOSPITAL_COMMUNITY): Payer: 59

## 2015-01-01 ENCOUNTER — Ambulatory Visit (HOSPITAL_COMMUNITY)
Admission: RE | Admit: 2015-01-01 | Discharge: 2015-01-01 | Disposition: A | Discharge status: Home / Self Care | Payer: 59 | Source: Ambulatory Visit | Attending: Internal Medicine | Admitting: Internal Medicine

## 2015-01-01 ENCOUNTER — Encounter (HOSPITAL_COMMUNITY): Payer: Self-pay

## 2015-01-01 DIAGNOSIS — R1111 Vomiting without nausea: Secondary | ICD-10-CM | POA: Insufficient documentation

## 2015-01-01 MED ORDER — TECHNETIUM TC 99M SULFUR COLLOID
2.0000 | Freq: Once | INTRAVENOUS | Status: AC | PRN
  Administered 2015-01-01: 2 via ORAL

## 2015-02-04 ENCOUNTER — Encounter (INDEPENDENT_AMBULATORY_CARE_PROVIDER_SITE_OTHER): Payer: Self-pay | Admitting: Internal Medicine

## 2015-02-23 ENCOUNTER — Other Ambulatory Visit: Payer: Self-pay | Admitting: Adult Health

## 2015-05-17 ENCOUNTER — Other Ambulatory Visit: Payer: Self-pay | Admitting: Obstetrics and Gynecology

## 2015-05-18 LAB — CYTOLOGY - PAP

## 2015-05-21 ENCOUNTER — Other Ambulatory Visit: Payer: Self-pay | Admitting: Obstetrics and Gynecology

## 2015-05-21 DIAGNOSIS — R928 Other abnormal and inconclusive findings on diagnostic imaging of breast: Secondary | ICD-10-CM

## 2015-05-28 ENCOUNTER — Ambulatory Visit
Admission: RE | Admit: 2015-05-28 | Discharge: 2015-05-28 | Disposition: A | Discharge status: Home / Self Care | Payer: 59 | Source: Ambulatory Visit | Attending: Obstetrics and Gynecology | Admitting: Obstetrics and Gynecology

## 2015-05-28 DIAGNOSIS — R928 Other abnormal and inconclusive findings on diagnostic imaging of breast: Secondary | ICD-10-CM

## 2015-06-04 ENCOUNTER — Encounter (INDEPENDENT_AMBULATORY_CARE_PROVIDER_SITE_OTHER): Payer: Self-pay

## 2015-06-15 ENCOUNTER — Ambulatory Visit: Payer: 59 | Admitting: Internal Medicine

## 2015-06-17 ENCOUNTER — Ambulatory Visit: Payer: 59 | Admitting: Internal Medicine

## 2015-06-30 ENCOUNTER — Telehealth (INDEPENDENT_AMBULATORY_CARE_PROVIDER_SITE_OTHER): Payer: Self-pay | Admitting: *Deleted

## 2015-06-30 NOTE — Telephone Encounter (Signed)
Theresa [name] would like to know if we have any samples of Dexilant. The return phone number is [phone].

## 2015-06-30 NOTE — Telephone Encounter (Signed)
Samples of Dexilant was pulled for the patient. A message was left on her cell phone.

## 2015-07-06 DIAGNOSIS — R109 Unspecified abdominal pain: Secondary | ICD-10-CM

## 2015-07-12 ENCOUNTER — Ambulatory Visit (INDEPENDENT_AMBULATORY_CARE_PROVIDER_SITE_OTHER): Payer: 59 | Admitting: Internal Medicine

## 2015-07-12 ENCOUNTER — Encounter: Payer: Self-pay | Admitting: Internal Medicine

## 2015-07-12 VITALS — BP 130/82 | HR 76 | Temp 97.8°F | Wt 192.0 lb

## 2015-07-12 DIAGNOSIS — M2550 Pain in unspecified joint: Secondary | ICD-10-CM | POA: Diagnosis not present

## 2015-07-12 DIAGNOSIS — R5382 Chronic fatigue, unspecified: Secondary | ICD-10-CM

## 2015-07-12 DIAGNOSIS — R5383 Other fatigue: Secondary | ICD-10-CM | POA: Insufficient documentation

## 2015-07-12 LAB — COMPREHENSIVE METABOLIC PANEL
ALT: 11 U/L (ref 6–29)
AST: 13 U/L (ref 10–30)
Albumin: 4.1 g/dL (ref 3.6–5.1)
Alkaline Phosphatase: 79 U/L (ref 33–115)
BUN: 11 mg/dL (ref 7–25)
CHLORIDE: 102 mmol/L (ref 98–110)
CO2: 30 mmol/L (ref 20–31)
CREATININE: 0.83 mg/dL (ref 0.50–1.10)
Calcium: 9 mg/dL (ref 8.6–10.2)
Glucose, Bld: 87 mg/dL (ref 65–99)
Potassium: 4.2 mmol/L (ref 3.5–5.3)
SODIUM: 138 mmol/L (ref 135–146)
Total Bilirubin: 0.5 mg/dL (ref 0.2–1.2)
Total Protein: 6.8 g/dL (ref 6.1–8.1)

## 2015-07-12 LAB — CBC WITH DIFFERENTIAL/PLATELET
Basophils Absolute: 0.1 10*3/uL (ref 0.0–0.1)
Basophils Relative: 1 % (ref 0–1)
EOS PCT: 3 % (ref 0–5)
Eosinophils Absolute: 0.3 10*3/uL (ref 0.0–0.7)
HCT: 37.8 % (ref 36.0–46.0)
Hemoglobin: 12.5 g/dL (ref 12.0–15.0)
LYMPHS ABS: 2 10*3/uL (ref 0.7–4.0)
Lymphocytes Relative: 21 % (ref 12–46)
MCH: 28.8 pg (ref 26.0–34.0)
MCHC: 33.1 g/dL (ref 30.0–36.0)
MCV: 87.1 fL (ref 78.0–100.0)
MONOS PCT: 7 % (ref 3–12)
MPV: 9.2 fL (ref 8.6–12.4)
Monocytes Absolute: 0.7 10*3/uL (ref 0.1–1.0)
NEUTROS ABS: 6.3 10*3/uL (ref 1.7–7.7)
NEUTROS PCT: 68 % (ref 43–77)
PLATELETS: 281 10*3/uL (ref 150–400)
RBC: 4.34 MIL/uL (ref 3.87–5.11)
RDW: 13.6 % (ref 11.5–15.5)
WBC: 9.3 10*3/uL (ref 4.0–10.5)

## 2015-07-12 LAB — C-REACTIVE PROTEIN: CRP: <0.5 mg/dL (ref <0.60)

## 2015-07-12 NOTE — Progress Notes (Signed)
[facility] for Infectious Disease  Reason for Consult: Possible Epstein-Barr virus infection Referring Physician: Dr. Velvet Bathe  Patient Active Problem List   Diagnosis Date Noted  . Fatigue 07/12/2015    Priority: High  . Arthralgia 07/12/2015    Priority: Medium  . AP (abdominal pain) 07/06/2015  . Obesity 01/14/2014  . History of abnormal cervical Pap smear 12/02/2013  . Internal hemorrhoids with other complication 0000000  . Generalized anxiety disorder 12/25/2012  . GERD (gastroesophageal reflux disease) 10/31/2011  . GASTROESOPHAGEAL REFLUX DISEASE 07/13/2008  . HEARTBURN 07/13/2008  . CONSTIPATION 05/13/2008  . BLOOD IN STOOL 05/13/2008  . CHANGE IN BOWELS 05/13/2008    Patient's Medications  New Prescriptions   No medications on file  Previous Medications   ALPRAZOLAM (XANAX) 1 MG TABLET    TAKE 1 TABLET BY MOUTH EVERY 12 HOURS AS NEEDED FOR ANXIETY   CITALOPRAM (CELEXA) 40 MG TABLET    Take 1 tablet (40 mg total) by mouth daily.   CYCLOSPORINE (RESTASIS) 0.05 % OPHTHALMIC EMULSION    Place 1 drop into both eyes 2 (two) times daily.   DEXLANSOPRAZOLE (DEXILANT) 60 MG CAPSULE    Take 1 capsule (60 mg total) by mouth daily.   LEVONORGESTREL (MIRENA) 20 MCG/24HR IUD    1 each by Intrauterine route once.   ONDANSETRON (ZOFRAN) 4 MG TABLET    Take 1 tablet (4 mg total) by mouth every 8 (eight) hours as needed for nausea or vomiting.   OXYCODONE-ACETAMINOPHEN (PERCOCET) 7.5-325 MG PER TABLET    Take 1-2 tablets by mouth every 4 (four) hours as needed.   RANITIDINE (ZANTAC) 150 MG CAPSULE    Take 150 mg by mouth 2 (two) times daily.   SUCRALFATE (CARAFATE) 1 G TABLET    Take 2 tablets (2 g total) by mouth at bedtime.   VALACYCLOVIR (VALTREX) 1000 MG TABLET    TAKE 2 TABLETS NOW THEN TAKE 2 TABLETS IN THE MORNING  Modified Medications   No medications on file  Discontinued Medications   No medications on file    Recommendations: 1. CBC with  differential, complete metabolic panel, sedimentation rate and C-reactive protein 2. Recommend consideration of sleep study 3. I gave her written information about Systemic Exercise Intolerance Disorder (chronic fatigue syndrome)   Assessment: I do not think that she has acute or chronic Epstein-Barr virus infection. Her positive antibodies probably reflect remote and inactive infection. Although IgM antibodies usually wane within several months after acute infection it is well known that other disorders can cause nonspecific reactivation. Her clinical illness is not typical of infectious mononucleosis and if she did have acute infection it would not cause symptoms that last this long. True chronic Epstein-Barr virus infection is extremely rare and would normally be associated with fever, lymphadenopathy, pharyngitis and hepatosplenomegaly. I will check routine lab work today to see if there are any abnormalities that might explain her fatigue.  I suspect that to some degree her fatigue is multifactorial. Obviously something triggered the fatigue to begin with but I think that her fatigue may also be due in part to worsening depression, weight gain and deconditioning. I am also concerned that she could possibly have sleep apnea. Apparently Dr. Luan Pulling had recommended a sleep study but she had declined. I encouraged her to consider this. She does meet the definition of Systemic Exercise Intolerance Disorder (chronic fatigue syndrome). I did give her written information about this. I will call her  with the results of her blood work. The positive antibodies for Epstein-Barr virus to not require any further workup or evaluation. She is not contagious and does not need to be out of work.   HPI: Theresa [name] is a 44 y.o. female who had a gradual onset of polyarthralgias and fatigue in February of this year. Around that time she was diagnosed with gallbladder problems and underwent laparoscopic cholecystectomy in  March. Some intermittent abdominal pains disappeared but she continued to have the joint pains and fatigue. Over the past few months the joint pains have become less frequent and severe but her fatigue has gotten worse. She states that she could sleep up to 20 hours daily if her schedule allowed. She has had difficulty keeping up with housework and has given up golf and other forms of exercise. She has gained 22 pounds unintentionally. She's had intermittent night sweats for over one year and underwent an endocrine workup earlier this year without any abnormalities being discovered. She has not had any fever, chills or other signs of infection. She has not had any rash. She has continued to work full-time but has found this to be quite difficult because of her fatigue. She has felt more depressed since her fatigue began. Recently her husband has noticed that she has been snoring quite loudly and that is unusual for her. He is also noted some periods when she seems to stop breathing, then will take a loud deep breath and start breathing again. He said that it was so distinctive and unusual that he has actually recorded her while sleeping. She saw Dr. Louretta Shorten, her obstetrician-gynecologist, recently. He obtained an Epstein-Barr virus panel which revealed that her viral capsid antigen IgM and IgG antibodies, early antigen antibody and nuclear antigen antibody were all positive leading to her referral here. Cytomegalovirus and parvovirus antibodies were negative.  Review of Systems: Review of Systems  Constitutional: Positive for malaise/fatigue and diaphoresis. Negative for fever, chills and weight loss.       22 pound unintentional weight gain.  HENT: Negative for sore throat.   Eyes: Negative for redness.  Respiratory: Negative for cough, sputum production and shortness of breath.   Cardiovascular: Positive for leg swelling. Negative for chest pain.  Gastrointestinal: Negative for nausea, vomiting,  abdominal pain and diarrhea.  Genitourinary: Negative for dysuria and frequency.  Musculoskeletal: Positive for joint pain. Negative for myalgias.  Skin: Negative for rash.  Neurological: Negative for focal weakness and headaches.       Difficulty with concentration recently.  Endo/Heme/Allergies: Positive for environmental allergies.  Psychiatric/Behavioral: Positive for depression. Negative for substance abuse. The patient is not nervous/anxious and does not have insomnia.       Past Medical History  Diagnosis Date  . IBS (irritable bowel syndrome)     constipation  . GERD (gastroesophageal reflux disease)   . Hyperlipidemia   . Chronic kidney disease     kidney stones  . Insomnia   . Anxiety   . Depression   . HPV (human papilloma virus) infection   . Colon adenoma DEC 2014    ONE(7 MM) SIMPLE([place])  . Vaginal discharge 09/04/2013  . Yeast infection 09/04/2013  . Vaginal Pap smear, abnormal   . History of abnormal cervical Pap smear 12/02/2013  . Rash 12/02/2013  . Body aches 12/02/2013  . Obesity 01/14/2014  . Edema   . Pre-diabetes 09/2014  . Allergy     Social History  Substance Use Topics  .  Smoking status: Never Smoker   . Smokeless tobacco: Never Used     Comment: Never smoker  . Alcohol Use: 0.0 oz/week    0 Standard drinks or equivalent per week     Comment: socially    Family History  Problem Relation Age of Onset  . Colon cancer Neg Hx   . Cancer Mother     head and neck  . Hypertension Father   . Hyperlipidemia Father   . Pancreatic cancer Maternal Grandfather    No Known Allergies  OBJECTIVE: Filed Vitals:   07/12/15 1604  BP: 130/82  Pulse: 76  Temp: 97.8 F (36.6 C)  TempSrc: Oral  Weight: 192 lb (87.091 kg)   Body mass index is 31 kg/(m^2).   Physical Exam  Constitutional: She is oriented to person, place, and time.  She is well dressed, pleasant and in no distressed. She is accompanied by her husband.  HENT:  Mouth/Throat:  Oropharynx is clear and moist. No oropharyngeal exudate.  Eyes: Conjunctivae are normal.  Cardiovascular: Normal rate and regular rhythm.   No murmur heard. Pulmonary/Chest: Breath sounds normal.  Abdominal: Soft. She exhibits no mass. There is no hepatosplenomegaly. There is no tenderness.  Musculoskeletal: Normal range of motion.  Lymphadenopathy:    She has no cervical adenopathy.    She has no axillary adenopathy.  Neurological: She is alert and oriented to person, place, and time.  Skin: No rash noted.  Psychiatric: Mood and affect normal.    Microbiology: No results found for this or any previous visit (from the past 240 hour(s)).  Michel Bickers, MD [facility] [phone] pager   [phone] cell 07/12/2015, 5:12 PM

## 2015-07-13 ENCOUNTER — Telehealth: Payer: Self-pay | Admitting: Internal Medicine

## 2015-07-13 LAB — SEDIMENTATION RATE: Sed Rate: 17 mm/hr (ref 0–20)

## 2015-07-13 NOTE — Telephone Encounter (Signed)
CMP     Component Value Date/Time   NA 138 07/12/2015 1709   K 4.2 07/12/2015 1709   CL 102 07/12/2015 1709   CO2 30 07/12/2015 1709   GLUCOSE 87 07/12/2015 1709   BUN 11 07/12/2015 1709   CREATININE 0.83 07/12/2015 1709   CREATININE 0.82 12/18/2014 1540   CALCIUM 9.0 07/12/2015 1709   PROT 6.8 07/12/2015 1709   ALBUMIN 4.1 07/12/2015 1709   AST 13 07/12/2015 1709   ALT 11 07/12/2015 1709   ALKPHOS 79 07/12/2015 1709   BILITOT 0.5 07/12/2015 1709   GFRNONAA >60 12/18/2014 1540   GFRAA >60 12/18/2014 1540   Lab Results  Component Value Date   WBC 9.3 07/12/2015   HGB 12.5 07/12/2015   HCT 37.8 07/12/2015   MCV 87.1 07/12/2015   PLT 281 07/12/2015   SED RATE (mm/hr)  Date Value  07/12/2015 17  10/28/2014 12  12/02/2013 11   CRP (mg/dL)  Date Value  07/12/2015 <0.5  12/02/2013 <0.5   I called Theresa [name] today and left a message on her voicemail informing her that all of her lab work was normal. I do not see any evidence of active Epstein-Barr virus infection or other infectious diseases causing her chronic fatigue.

## 2015-07-23 ENCOUNTER — Other Ambulatory Visit (HOSPITAL_COMMUNITY): Payer: Self-pay | Admitting: Respiratory Therapy

## 2015-07-23 DIAGNOSIS — G4730 Sleep apnea, unspecified: Secondary | ICD-10-CM

## 2015-08-30 ENCOUNTER — Telehealth (INDEPENDENT_AMBULATORY_CARE_PROVIDER_SITE_OTHER): Payer: Self-pay | Admitting: Internal Medicine

## 2015-08-30 MED FILL — ALPRAZolam 1 MG TABS: 1 | 30 days supply | Qty: 60 | Fill #0

## 2015-08-30 NOTE — Telephone Encounter (Signed)
Samples are ready for the patient to pick up. A message was left on her voice mail.

## 2015-08-30 NOTE — Telephone Encounter (Signed)
Ms. Cimaglia left a message wondering if we have any Dexilant in the office. She'd like a phone call regarding this.  Pt's ph# [phone] Thank you.

## 2015-09-06 DIAGNOSIS — F3181 Bipolar II disorder: Secondary | ICD-10-CM | POA: Diagnosis not present

## 2015-09-08 MED FILL — ZALEPLON 10 MG CAPSULE: 10 | 30 days supply | Qty: 60 | Fill #0

## 2015-09-14 MED FILL — RESTASIS 0.05% EYE EMULSION: 0.05 | 90 days supply | Qty: 180 | Fill #0 | Status: TO

## 2015-09-23 MED FILL — valACYclovir HCL 1 GM TABS: 1 | 90 days supply | Qty: 90 | Fill #0

## 2015-09-27 MED FILL — CITALOPRAM HBR 40 MG TABLET: 40 | 90 days supply | Qty: 90 | Fill #0

## 2015-10-15 MED FILL — ZOLPIDEM TARTRATE 10 MG TAB: 10 | 90 days supply | Qty: 90 | Fill #0

## 2015-10-20 MED FILL — ALPRAZolam 1 MG TABS: 1 | 90 days supply | Qty: 180 | Fill #0

## 2015-11-24 MED FILL — FLUTICASONE PROP 50 MCG SPR: 50 | 90 days supply | Qty: 48 | Fill #1

## 2015-12-30 MED FILL — CITALOPRAM HBR 40 MG TABLET: 40 | 90 days supply | Qty: 90 | Fill #1

## 2015-12-31 ENCOUNTER — Other Ambulatory Visit (INDEPENDENT_AMBULATORY_CARE_PROVIDER_SITE_OTHER): Payer: Self-pay | Admitting: *Deleted

## 2015-12-31 DIAGNOSIS — R1110 Vomiting, unspecified: Secondary | ICD-10-CM

## 2015-12-31 MED ORDER — SUCRALFATE 1 G PO TABS
2.0000 g | ORAL_TABLET | Freq: Four times a day (QID) | ORAL | Status: DC | PRN
Start: 2015-12-31 — End: 2016-05-11

## 2015-12-31 MED ORDER — ONDANSETRON HCL 4 MG PO TABS: 4.0000 mg | ORAL_TABLET | Freq: Two times a day (BID) | ORAL | Status: DC | PRN

## 2015-12-31 MED ORDER — LANSOPRAZOLE 30 MG PO CPDR: 30.0000 mg | DELAYED_RELEASE_CAPSULE | Freq: Every day | ORAL | Status: DC

## 2015-12-31 MED FILL — SUCRALFATE 1 GM TABLET: 1 | 15 days supply | Qty: 120 | Fill #0

## 2015-12-31 MED FILL — LANSOPRAZOLE DR 30 MG CAP: 30 | 30 days supply | Qty: 30 | Fill #0

## 2015-12-31 NOTE — Telephone Encounter (Signed)
May send in Rx per Dr.Rehman.

## 2016-01-05 ENCOUNTER — Other Ambulatory Visit (INDEPENDENT_AMBULATORY_CARE_PROVIDER_SITE_OTHER): Payer: Self-pay | Admitting: Internal Medicine

## 2016-01-05 MED ORDER — METOCLOPRAMIDE HCL 10 MG PO TABS: 10.0000 mg | ORAL_TABLET | Freq: Three times a day (TID) | ORAL | Status: DC

## 2016-01-05 NOTE — Progress Notes (Unsigned)
Talked with Theresa [name] this morning. She is having intractable GERD and tightness in her chest. She feels food is not going down. She is on pantoprazole. She had EGD last year revealing small sliding hiatal hernia. Will treat short-term with metoclopramide. Patient informed of potential side effects. If she has any side effects she will stop the medicine and let me know. Progress report in 2 weeks.

## 2016-01-19 MED FILL — ZOLPIDEM TARTRATE 10 MG TAB: 10 | 90 days supply | Qty: 90 | Fill #0

## 2016-02-10 DIAGNOSIS — N816 Rectocele: Secondary | ICD-10-CM | POA: Diagnosis not present

## 2016-02-10 MED FILL — LANSOPRAZOLE DR 30 MG CAP: 30 | 30 days supply | Qty: 30 | Fill #1

## 2016-02-10 MED FILL — ALPRAZolam 1 MG TABS: 1 | 90 days supply | Qty: 180 | Fill #1

## 2016-02-11 ENCOUNTER — Telehealth: Payer: Self-pay | Admitting: Internal Medicine

## 2016-02-11 NOTE — Telephone Encounter (Signed)
Dr Gessner please advise? 

## 2016-02-15 NOTE — Telephone Encounter (Signed)
i will see her

## 2016-02-16 ENCOUNTER — Encounter: Payer: Self-pay | Admitting: Internal Medicine

## 2016-02-16 NOTE — Telephone Encounter (Signed)
Patient has been scheduled for earliest appointment 9/5 at 10:00am for appointment.

## 2016-03-06 MED FILL — LANSOPRAZOLE DR 30 MG CAP: 30 | 90 days supply | Qty: 90 | Fill #2

## 2016-04-10 MED FILL — CITALOPRAM HBR 40 MG TABLET: 40 | 90 days supply | Qty: 90 | Fill #0

## 2016-04-18 ENCOUNTER — Ambulatory Visit: Payer: 59 | Admitting: Internal Medicine

## 2016-05-11 ENCOUNTER — Ambulatory Visit (INDEPENDENT_AMBULATORY_CARE_PROVIDER_SITE_OTHER): Payer: 59 | Admitting: Nurse Practitioner

## 2016-05-11 ENCOUNTER — Encounter: Payer: Self-pay | Admitting: Nurse Practitioner

## 2016-05-11 VITALS — BP 116/74 | Ht 66.0 in | Wt 197.0 lb

## 2016-05-11 DIAGNOSIS — F411 Generalized anxiety disorder: Secondary | ICD-10-CM | POA: Diagnosis not present

## 2016-05-11 DIAGNOSIS — Z23 Encounter for immunization: Secondary | ICD-10-CM

## 2016-05-11 DIAGNOSIS — Z13220 Encounter for screening for lipoid disorders: Secondary | ICD-10-CM

## 2016-05-11 DIAGNOSIS — R5382 Chronic fatigue, unspecified: Secondary | ICD-10-CM

## 2016-05-11 DIAGNOSIS — Z79899 Other long term (current) drug therapy: Secondary | ICD-10-CM | POA: Diagnosis not present

## 2016-05-12 ENCOUNTER — Encounter: Payer: Self-pay | Admitting: Nurse Practitioner

## 2016-05-12 NOTE — Progress Notes (Signed)
Subjective:  Presents for routine follow-up. Is regular preventive health physicals including mammogram and Pap smear. Has not had routine labs done. Continues to work on her weight. No regular exercise other than walking that she does area some fatigue. Anxiety has been stable on citalopram and Xanax. Has Mirena for birth control. No breakthrough bleeding. Gets regular follow-up with GI specialist. No chest pain/ischemic type pain or shortness of breath.  Objective:   BP 116/74   Ht 5\' 6"  (1.676 m)   Wt 197 lb (89.4 kg)   BMI 31.80 kg/m  NAD. Alert, oriented. Lungs clear. Heart regular rate rhythm. Thyroid nontender to palpation, no mass or goiter noted.  Assessment:  Problem List Items Addressed This Visit      Other   Fatigue   Relevant Orders   CBC with Differential/Platelet   TSH   VITAMIN D 25 Hydroxy (Vit-D Deficiency, Fractures)   Generalized anxiety disorder - Primary    Other Visit Diagnoses    Screening for lipid disorders       Relevant Orders   Lipid panel   High risk medication use       Relevant Orders   Basic metabolic panel   Hepatic function panel   Need for vaccination       Relevant Orders   Td vaccine greater than or equal to 7yo preservative free IM (Completed)   Flu vaccine greater than or equal to 3yo preservative free IM (Completed)     Plan: Continue to work on weight loss, healthy diet and regular activity such as walking. Lab work pending. Recheck here as needed.

## 2016-05-19 DIAGNOSIS — Z13220 Encounter for screening for lipoid disorders: Secondary | ICD-10-CM | POA: Diagnosis not present

## 2016-05-19 DIAGNOSIS — Z79899 Other long term (current) drug therapy: Secondary | ICD-10-CM | POA: Diagnosis not present

## 2016-05-19 DIAGNOSIS — R5382 Chronic fatigue, unspecified: Secondary | ICD-10-CM | POA: Diagnosis not present

## 2016-05-20 LAB — HEPATIC FUNCTION PANEL
ALT: 16 IU/L (ref 0–32)
AST: 18 IU/L (ref 0–40)
Albumin: 4.2 g/dL (ref 3.5–5.5)
Alkaline Phosphatase: 76 IU/L (ref 39–117)
BILIRUBIN TOTAL: 0.3 mg/dL (ref 0.0–1.2)
Bilirubin, Direct: 0.09 mg/dL (ref 0.00–0.40)
Total Protein: 6.9 g/dL (ref 6.0–8.5)

## 2016-05-20 LAB — BASIC METABOLIC PANEL
BUN/Creatinine Ratio: 16 (ref 9–23)
BUN: 13 mg/dL (ref 6–24)
CALCIUM: 9.1 mg/dL (ref 8.7–10.2)
CO2: 24 mmol/L (ref 18–29)
CREATININE: 0.81 mg/dL (ref 0.57–1.00)
Chloride: 102 mmol/L (ref 96–106)
GFR calc Af Amer: 102 mL/min/{1.73_m2} (ref >59)
GFR calc non Af Amer: 89 mL/min/{1.73_m2} (ref >59)
GLUCOSE: 94 mg/dL (ref 65–99)
Potassium: 4.4 mmol/L (ref 3.5–5.2)
Sodium: 141 mmol/L (ref 134–144)

## 2016-05-20 LAB — CBC WITH DIFFERENTIAL/PLATELET
BASOS ABS: 0 10*3/uL (ref 0.0–0.2)
Basos: 1 %
EOS (ABSOLUTE): 0.4 10*3/uL (ref 0.0–0.4)
Eos: 5 %
HEMATOCRIT: 38.7 % (ref 34.0–46.6)
HEMOGLOBIN: 12.8 g/dL (ref 11.1–15.9)
IMMATURE GRANS (ABS): 0 10*3/uL (ref 0.0–0.1)
IMMATURE GRANULOCYTES: 0 %
LYMPHS: 24 %
Lymphocytes Absolute: 1.9 10*3/uL (ref 0.7–3.1)
MCH: 29.4 pg (ref 26.6–33.0)
MCHC: 33.1 g/dL (ref 31.5–35.7)
MCV: 89 fL (ref 79–97)
Monocytes Absolute: 0.5 10*3/uL (ref 0.1–0.9)
Monocytes: 6 %
Neutrophils Absolute: 5.1 10*3/uL (ref 1.4–7.0)
Neutrophils: 64 %
Platelets: 280 10*3/uL (ref 150–379)
RBC: 4.35 x10E6/uL (ref 3.77–5.28)
RDW: 13.2 % (ref 12.3–15.4)
WBC: 7.9 10*3/uL (ref 3.4–10.8)

## 2016-05-20 LAB — VITAMIN D 25 HYDROXY (VIT D DEFICIENCY, FRACTURES): Vit D, 25-Hydroxy: 34.3 ng/mL (ref 30.0–100.0)

## 2016-05-20 LAB — LIPID PANEL
CHOLESTEROL TOTAL: 206 mg/dL — AB (ref 100–199)
Chol/HDL Ratio: 4.5 ratio units — ABNORMAL HIGH (ref 0.0–4.4)
HDL: 46 mg/dL (ref >39)
LDL CALC: 136 mg/dL — AB (ref 0–99)
TRIGLYCERIDES: 121 mg/dL (ref 0–149)
VLDL Cholesterol Cal: 24 mg/dL (ref 5–40)

## 2016-05-20 LAB — TSH: TSH: 1.62 u[IU]/mL (ref 0.450–4.500)

## 2016-05-25 MED FILL — LANSOPRAZOLE DR 30 MG CAP: 30 | 30 days supply | Qty: 30 | Fill #3

## 2016-05-31 ENCOUNTER — Encounter: Payer: Self-pay | Admitting: Nurse Practitioner

## 2016-06-06 ENCOUNTER — Telehealth (INDEPENDENT_AMBULATORY_CARE_PROVIDER_SITE_OTHER): Payer: Self-pay | Admitting: Internal Medicine

## 2016-06-06 NOTE — Telephone Encounter (Signed)
Patient would like some Trulance samples  [phone]

## 2016-06-07 NOTE — Telephone Encounter (Signed)
Samples were given per Dr.Rehman's approval.

## 2016-06-19 DIAGNOSIS — H52222 Regular astigmatism, left eye: Secondary | ICD-10-CM | POA: Diagnosis not present

## 2016-06-19 DIAGNOSIS — H524 Presbyopia: Secondary | ICD-10-CM | POA: Diagnosis not present

## 2016-06-19 DIAGNOSIS — H5203 Hypermetropia, bilateral: Secondary | ICD-10-CM | POA: Diagnosis not present

## 2016-06-22 ENCOUNTER — Encounter: Payer: Self-pay | Admitting: Nurse Practitioner

## 2016-06-22 ENCOUNTER — Ambulatory Visit (INDEPENDENT_AMBULATORY_CARE_PROVIDER_SITE_OTHER): Payer: 59 | Admitting: Nurse Practitioner

## 2016-06-22 VITALS — BP 122/84 | HR 69 | Ht 66.0 in | Wt 200.4 lb

## 2016-06-22 DIAGNOSIS — F411 Generalized anxiety disorder: Secondary | ICD-10-CM

## 2016-06-22 DIAGNOSIS — R5383 Other fatigue: Secondary | ICD-10-CM | POA: Diagnosis not present

## 2016-06-22 MED ORDER — PHENTERMINE HCL 37.5 MG PO TABS: 37.5000 mg | ORAL_TABLET | Freq: Every day | ORAL | 2 refills | Status: DC

## 2016-06-22 NOTE — Patient Instructions (Signed)
Qsymia Belviq Vyvanse binge eating

## 2016-06-23 ENCOUNTER — Encounter: Payer: Self-pay | Admitting: Nurse Practitioner

## 2016-06-23 MED ORDER — ALPRAZOLAM 1 MG PO TABS: 1.0000 mg | ORAL_TABLET | Freq: Two times a day (BID) | ORAL | 0 refills | Status: DC | PRN

## 2016-06-23 NOTE — Progress Notes (Signed)
Subjective:  Presents for recheck. Doing horribly with diet. Having carb cravings. Trying to increase activity. Some binge eating at times. Has taken Phentermine in the past without side effects. Would like to try again. Fatigue since she has gained weight. Celexa working well for her anxiety.   Objective:   BP 122/84 (BP Location: Left Arm, Patient Position: Sitting, Cuff Size: Large)   Pulse 69   Ht 5\' 6"  (1.676 m)   Wt 200 lb 6.4 oz (90.9 kg)   SpO2 98%   BMI 32.35 kg/m  NAD. Alert, oriented. Lungs clear. Heart RRR.   Assessment:  Problem List Items Addressed This Visit      Other   Fatigue - Primary   Generalized anxiety disorder    Other Visit Diagnoses    Morbid obesity ([place])       Relevant Medications   phentermine (ADIPEX-P) 37.5 MG tablet       Plan:  Meds ordered this encounter  Medications  . phentermine (ADIPEX-P) 37.5 MG tablet    Sig: Take 1 tablet (37.5 mg total) by mouth daily before breakfast.    Dispense:  30 tablet    Refill:  2    Order Specific Question:   Supervising Provider    Answer:   Mikey Kirschner [[id number]]  . ALPRAZolam (XANAX) 1 MG tablet    Sig: Take 1 tablet (1 mg total) by mouth every 12 (twelve) hours as needed. for anxiety    Dispense:  60 tablet    Refill:  0    [facility]    Order Specific Question:   Supervising Provider    Answer:   Mikey Kirschner [[id number]]   Patient called for Xanax refill after visit. Refill sent in. Start regular activity. Cut back on sugar and simple carbs. Return in about 3 months (around 09/22/2016) for weight loss recheck.

## 2016-06-28 ENCOUNTER — Other Ambulatory Visit: Payer: Self-pay | Admitting: Nurse Practitioner

## 2016-06-28 MED ORDER — ALPRAZOLAM 1 MG PO TABS: 1.0000 mg | ORAL_TABLET | Freq: Two times a day (BID) | ORAL | 2 refills | Status: DC | PRN

## 2016-06-28 MED ORDER — ALPRAZOLAM 1 MG PO TABS
1.0000 mg | ORAL_TABLET | Freq: Two times a day (BID) | ORAL | 0 refills | Status: DC | PRN
Start: 2016-06-28 — End: 2016-06-28

## 2016-06-28 MED FILL — ALPRAZolam 1 MG TABS: 1 | 30 days supply | Qty: 60 | Fill #0

## 2016-06-28 NOTE — Telephone Encounter (Signed)
Ok plus two ref 

## 2016-06-30 ENCOUNTER — Telehealth: Payer: Self-pay | Admitting: *Deleted

## 2016-06-30 NOTE — Telephone Encounter (Signed)
PA for phentermine was approved through 09/27/2016. Pt notified. Reference # PA - [id number]

## 2016-07-10 ENCOUNTER — Telehealth (INDEPENDENT_AMBULATORY_CARE_PROVIDER_SITE_OTHER): Payer: Self-pay | Admitting: Internal Medicine

## 2016-07-10 ENCOUNTER — Telehealth (INDEPENDENT_AMBULATORY_CARE_PROVIDER_SITE_OTHER): Payer: Self-pay | Admitting: *Deleted

## 2016-07-10 ENCOUNTER — Other Ambulatory Visit: Payer: Self-pay | Admitting: Nurse Practitioner

## 2016-07-10 ENCOUNTER — Other Ambulatory Visit: Payer: Self-pay | Admitting: Family Medicine

## 2016-07-10 ENCOUNTER — Encounter: Payer: Self-pay | Admitting: Nurse Practitioner

## 2016-07-10 MED ORDER — CITALOPRAM HYDROBROMIDE 40 MG PO TABS: 40.0000 mg | ORAL_TABLET | Freq: Every day | ORAL | 1 refills | Status: DC

## 2016-07-10 MED ORDER — LANSOPRAZOLE 30 MG PO CPDR: 30.0000 mg | DELAYED_RELEASE_CAPSULE | Freq: Every day | ORAL | 3 refills | Status: DC

## 2016-07-10 MED ORDER — PLECANATIDE 3 MG PO TABS: 3.0000 mg | ORAL_TABLET | Freq: Every day | ORAL | 5 refills | Status: DC

## 2016-07-10 MED FILL — CITALOPRAM HBR 40 MG TABLET: 40 | 90 days supply | Qty: 90 | Fill #0

## 2016-07-10 MED FILL — TRULANCE 3 MG TABLET: 3 | 30 days supply | Qty: 30 | Fill #0

## 2016-07-10 MED FILL — LANSOPRAZOLE DR 30 MG CAP: 30 | 90 days supply | Qty: 90 | Fill #0

## 2016-07-10 NOTE — Telephone Encounter (Signed)
Patient called the office this morning and is requesting these refills. Per Dr.Rehman -  May refill Trulance with 5 refills , Prevacid 90 with 3 refills. Per Dr.Rehman the patient will need to have a OV in 6 months. Medication refills was sent to [facility] Patient Department. Patient is a aware of this.

## 2016-07-10 NOTE — Telephone Encounter (Signed)
Refills have been addressed with Dr.Rehman and have been sent to the [facility]. Patient is aware. Per Dr.Rehman the patient will need to have an appointment in 6 months.

## 2016-07-10 NOTE — Telephone Encounter (Signed)
Patient called, stated that she needs a script for Trulance that she's almost out of samples.  She also requested a refill on Prevacid, she'd like a 90 day supply this time if possible.  [phone]

## 2016-07-11 ENCOUNTER — Encounter (INDEPENDENT_AMBULATORY_CARE_PROVIDER_SITE_OTHER): Payer: Self-pay | Admitting: Internal Medicine

## 2016-07-11 MED ORDER — CITALOPRAM HYDROBROMIDE 40 MG PO TABS: 40.0000 mg | ORAL_TABLET | Freq: Every day | ORAL | 0 refills | Status: DC

## 2016-07-11 NOTE — Telephone Encounter (Signed)
Patient was given an appointment for 01/09/17 at 3:30pm.  A letter was mailed to the patient.

## 2016-07-11 NOTE — Telephone Encounter (Signed)
Ok thre e mo worth 

## 2016-07-20 ENCOUNTER — Encounter: Payer: Self-pay | Admitting: Nurse Practitioner

## 2016-07-31 ENCOUNTER — Encounter: Payer: Self-pay | Admitting: Nurse Practitioner

## 2016-08-01 MED FILL — ALPRAZolam 1 MG TABS: 1 | 30 days supply | Qty: 60 | Fill #0

## 2016-08-03 ENCOUNTER — Other Ambulatory Visit: Payer: Self-pay | Admitting: Nurse Practitioner

## 2016-08-03 MED ORDER — ALPRAZOLAM 1 MG PO TABS: 1.0000 mg | ORAL_TABLET | Freq: Two times a day (BID) | ORAL | 2 refills | Status: DC | PRN

## 2016-08-11 ENCOUNTER — Other Ambulatory Visit (INDEPENDENT_AMBULATORY_CARE_PROVIDER_SITE_OTHER): Payer: Self-pay | Admitting: Internal Medicine

## 2016-08-21 ENCOUNTER — Ambulatory Visit (INDEPENDENT_AMBULATORY_CARE_PROVIDER_SITE_OTHER): Payer: 59 | Admitting: Family Medicine

## 2016-08-21 ENCOUNTER — Encounter (INDEPENDENT_AMBULATORY_CARE_PROVIDER_SITE_OTHER): Payer: Self-pay | Admitting: Internal Medicine

## 2016-08-21 ENCOUNTER — Encounter: Payer: Self-pay | Admitting: Family Medicine

## 2016-08-21 VITALS — BP 128/82 | Temp 97.9°F | Ht 66.0 in | Wt 193.2 lb

## 2016-08-21 DIAGNOSIS — J329 Chronic sinusitis, unspecified: Secondary | ICD-10-CM

## 2016-08-21 MED ORDER — AMOXICILLIN 500 MG PO CAPS: 500.0000 mg | ORAL_CAPSULE | Freq: Three times a day (TID) | ORAL | 0 refills | Status: DC

## 2016-08-21 NOTE — Progress Notes (Signed)
   Subjective:    Patient ID: Theresa [name], female    DOB: [birth year], 46 y.o.   MRN: [id number]  Cough  This is a new problem. The current episode started in the past 7 days. Associated symptoms include a fever, headaches, nasal congestion and wheezing. Treatments tried: otc cold and flu meds.   Got a hacky cough on Friday  Worsened since then  Aching hruting, med gunky productive cough  Felt hot and achey   Chills  Energy zero appetite minimal  Pos bact pneumonia in the co worker grpoup    Review of Systems  Constitutional: Positive for fever.  Respiratory: Positive for cough and wheezing.   Neurological: Positive for headaches.       Objective:   Physical Exam Alert vital stable hydration good moderate malaise. H&T moderate nasal congestion frontal tenderness pharynx normal bronchial cough heart regular rate and rhythm       Assessment & Plan:  Impression post flu rhinosinusitis/bronchitis plan antibiotics prescribed symptom care discussed warning signs discussed

## 2016-08-22 ENCOUNTER — Encounter: Payer: Self-pay | Admitting: Family Medicine

## 2016-08-22 ENCOUNTER — Telehealth: Payer: Self-pay | Admitting: *Deleted

## 2016-08-22 MED FILL — TRULANCE 3 MG TABLET: 3 | 30 days supply | Qty: 30 | Fill #1

## 2016-08-22 NOTE — Telephone Encounter (Signed)
Done, notified patient via [place].

## 2016-08-22 NOTE — Telephone Encounter (Signed)
Nurses please talk with pt. With it bein sinus infxn with meds just started yes may need to stay home couple more days, but Pt does not have to stay home, really depends on how she feels and whether she is upo to it, only pt can tell. Cheri Kearns thru fri  ----- Message -----  From: Carmelina Noun, LPN  Sent: [id number] AM  To: Mikey Kirschner, MD  Subject: Melton Alar: Visit Follow-Up Question                 ----- Message -----  From: Holley Dexter  Sent: 08/22/2016 10:13 AM  To: [facility] Pool  Subject: Visit Follow-Up Question               Scheduled to return to work tomorrow. Continue to feel same and cough very productive. Haven't had a fever since yesterday. Any suggestions as to if I am okay to try to go back to work tomorrow? Thank you. Theresa [name]   Discussed with pt. Pt wants work excuse through Friday. Please give.

## 2016-08-24 ENCOUNTER — Encounter: Payer: Self-pay | Admitting: Family Medicine

## 2016-08-24 DIAGNOSIS — Z0289 Encounter for other administrative examinations: Secondary | ICD-10-CM

## 2016-08-25 ENCOUNTER — Telehealth: Payer: Self-pay | Admitting: Pediatrics

## 2016-08-25 NOTE — Telephone Encounter (Signed)
I am concerned about this patient having wheezing coughing shortness of breath. I would advise urgent care

## 2016-08-25 NOTE — Telephone Encounter (Signed)
Patient was advised and voiced understanding 

## 2016-08-25 NOTE — Telephone Encounter (Signed)
Patient states cough, SOB, and wheezing increased last night. She reports using daughter's albuterol inhaler and it helped.  She states body aches and fever have improved, but cough, SOB, and wheezing have not. Patient denies resp distress at this time. Pt seen in office 08/21/16. Please advise.

## 2016-08-30 ENCOUNTER — Encounter: Payer: Self-pay | Admitting: Nurse Practitioner

## 2016-08-31 MED FILL — ALPRAZolam 1 MG TABS: 1 | 30 days supply | Qty: 60 | Fill #1

## 2016-09-01 ENCOUNTER — Telehealth: Payer: Self-pay | Admitting: Pediatrics

## 2016-09-01 MED ORDER — FLUCONAZOLE 150 MG PO TABS: ORAL_TABLET | ORAL | 0 refills | Status: DC

## 2016-09-01 NOTE — Telephone Encounter (Signed)
Patient sent MyChart message requestinf Diflucan post ATB. Rx sent per standing orders.

## 2016-09-04 ENCOUNTER — Encounter: Payer: Self-pay | Admitting: Nurse Practitioner

## 2016-09-07 ENCOUNTER — Encounter (INDEPENDENT_AMBULATORY_CARE_PROVIDER_SITE_OTHER): Payer: Self-pay | Admitting: Internal Medicine

## 2016-09-07 ENCOUNTER — Other Ambulatory Visit (INDEPENDENT_AMBULATORY_CARE_PROVIDER_SITE_OTHER): Payer: Self-pay | Admitting: *Deleted

## 2016-09-07 MED ORDER — DEXLANSOPRAZOLE 60 MG PO CPDR: 60.0000 mg | DELAYED_RELEASE_CAPSULE | Freq: Every day | ORAL | 3 refills | Status: DC

## 2016-09-07 NOTE — Telephone Encounter (Signed)
The patient was recently put back on the [place]. She experienced no reflux,ect. She is requesting that a 90 Rx be sent to [facility]. This has been done, and patient is aware.

## 2016-09-18 ENCOUNTER — Encounter: Payer: Self-pay | Admitting: Nurse Practitioner

## 2016-09-18 ENCOUNTER — Ambulatory Visit: Payer: 59 | Admitting: Nurse Practitioner

## 2016-09-26 DIAGNOSIS — R102 Pelvic and perineal pain: Secondary | ICD-10-CM | POA: Diagnosis not present

## 2016-09-26 DIAGNOSIS — Z6831 Body mass index (BMI) 31.0-31.9, adult: Secondary | ICD-10-CM | POA: Diagnosis not present

## 2016-09-26 DIAGNOSIS — Z01419 Encounter for gynecological examination (general) (routine) without abnormal findings: Secondary | ICD-10-CM | POA: Diagnosis not present

## 2016-10-02 MED FILL — ALPRAZolam 1 MG TABS: 1 | 30 days supply | Qty: 60 | Fill #2

## 2016-10-13 MED FILL — LANSOPRAZOLE DR 30 MG CAP: 30 | 90 days supply | Qty: 90 | Fill #1

## 2016-10-30 MED FILL — CITALOPRAM HBR 40 MG TABLET: 40 | 90 days supply | Qty: 90 | Fill #1

## 2016-10-31 ENCOUNTER — Encounter: Payer: Self-pay | Admitting: Nurse Practitioner

## 2016-11-01 ENCOUNTER — Other Ambulatory Visit: Payer: Self-pay | Admitting: Nurse Practitioner

## 2016-11-01 MED ORDER — ALPRAZOLAM 1 MG PO TABS: 1.0000 mg | ORAL_TABLET | Freq: Two times a day (BID) | ORAL | 2 refills | Status: DC | PRN

## 2016-11-01 MED FILL — ALPRAZolam 1 MG TABS: 1 | 30 days supply | Qty: 60 | Fill #0

## 2016-11-07 ENCOUNTER — Encounter: Payer: Self-pay | Admitting: Nurse Practitioner

## 2016-11-08 ENCOUNTER — Encounter: Payer: Self-pay | Admitting: Nurse Practitioner

## 2016-11-08 ENCOUNTER — Ambulatory Visit (INDEPENDENT_AMBULATORY_CARE_PROVIDER_SITE_OTHER): Payer: 59 | Admitting: Nurse Practitioner

## 2016-11-08 VITALS — BP 112/72 | Ht 66.0 in | Wt 198.0 lb

## 2016-11-08 DIAGNOSIS — R5383 Other fatigue: Secondary | ICD-10-CM

## 2016-11-08 DIAGNOSIS — G4700 Insomnia, unspecified: Secondary | ICD-10-CM | POA: Diagnosis not present

## 2016-11-08 DIAGNOSIS — F411 Generalized anxiety disorder: Secondary | ICD-10-CM

## 2016-11-08 MED ORDER — TEMAZEPAM 15 MG PO CAPS: 15.0000 mg | ORAL_CAPSULE | Freq: Every evening | ORAL | 2 refills | Status: DC | PRN

## 2016-11-09 ENCOUNTER — Encounter: Payer: Self-pay | Admitting: Nurse Practitioner

## 2016-11-09 NOTE — Progress Notes (Signed)
Subjective:  Presents for recheck on anxiety. Cut back Celexa while on antibiotics but noticed a significant increase in anxiety symptoms. Has since gone back on 40 mg with improvement. Has been on this dose since 2011. Has tried to wean off and other medications per mental health with poor outcomes. Was placed on 4 medications at one time during an office visit and experienced slight QT prolongation. Unsure which medication; was started on Adderall, Wellbutrin and Klonopin along with her Celexa all at one time. The other 3 meds were new for her. Takes Xanax during the day if extreme anxiety. Feels she may have developed a tolerance to med. Has tried this for sleep but minimal improvement. Has trouble going and staying asleep. Still experiencing fatigue. Most days she goes to bed when she gets home from work. Took Ambien during her pregnancy with minimal improvement. Denies any suicidal or homicidal thoughts or ideation.   Objective:   BP 112/72   Ht 5\' 6"  (1.676 m)   Wt 198 lb (89.8 kg)   BMI 31.96 kg/m  NAD. Alert, oriented. Lungs clear. Heart RRR. Thoughts logical, coherent and relevant. Calm affect. Making good eye contact.   Assessment:   Problem List Items Addressed This Visit      Other   Fatigue   Generalized anxiety disorder - Primary    Other Visit Diagnoses    Insomnia, unspecified type           Plan:   Meds ordered this encounter  Medications  . PRESCRIPTION MEDICATION    Sig: [place] one  A day by GYN  . temazepam (RESTORIL) 15 MG capsule    Sig: Take 1 capsule (15 mg total) by mouth at bedtime as needed for sleep. Do not take within 4 hours of taking Xanax    Dispense:  30 capsule    Refill:  2    Order Specific Question:   Supervising Provider    Answer:   Mikey Kirschner [[id number]]   Cannot use Trazodone due to potential QT prolongation with Celexa. Trial of Temazepam. Use only at night. Do not take within 4 hours of Xanax. Our first goal is to get her some quality  sleep. Discussed importance of healthy diet, exercise and stress reduction.  Return in about 3 months (around 02/08/2017) for recheck. Contact office sooner if no improvement or any problems.

## 2016-11-13 ENCOUNTER — Encounter: Payer: Self-pay | Admitting: Nurse Practitioner

## 2016-11-14 ENCOUNTER — Other Ambulatory Visit: Payer: Self-pay | Admitting: Nurse Practitioner

## 2016-11-14 MED ORDER — ZOLPIDEM TARTRATE ER 12.5 MG PO TBCR: 12.5000 mg | EXTENDED_RELEASE_TABLET | Freq: Every evening | ORAL | 0 refills | Status: DC | PRN

## 2016-11-16 MED FILL — TRULANCE 3 MG TABLET: 3 | 90 days supply | Qty: 90 | Fill #2

## 2016-12-09 ENCOUNTER — Other Ambulatory Visit: Payer: Self-pay | Admitting: Nurse Practitioner

## 2016-12-14 ENCOUNTER — Encounter (INDEPENDENT_AMBULATORY_CARE_PROVIDER_SITE_OTHER): Payer: Self-pay | Admitting: Internal Medicine

## 2016-12-14 ENCOUNTER — Other Ambulatory Visit (INDEPENDENT_AMBULATORY_CARE_PROVIDER_SITE_OTHER): Payer: Self-pay | Admitting: *Deleted

## 2016-12-14 DIAGNOSIS — R197 Diarrhea, unspecified: Secondary | ICD-10-CM

## 2016-12-14 MED FILL — ALPRAZolam 1 MG TABS: 1 | 30 days supply | Qty: 60 | Fill #1

## 2016-12-18 ENCOUNTER — Other Ambulatory Visit (INDEPENDENT_AMBULATORY_CARE_PROVIDER_SITE_OTHER): Payer: Self-pay | Admitting: *Deleted

## 2016-12-18 ENCOUNTER — Other Ambulatory Visit (INDEPENDENT_AMBULATORY_CARE_PROVIDER_SITE_OTHER): Payer: Self-pay | Admitting: Internal Medicine

## 2016-12-18 DIAGNOSIS — R197 Diarrhea, unspecified: Secondary | ICD-10-CM | POA: Diagnosis not present

## 2016-12-21 LAB — FECAL LACTOFERRIN, QUANT: Lactoferrin, Fecal, Quant.: 1.23 ug/mL(g) (ref 0.00–7.24)

## 2016-12-22 LAB — OVA AND PARASITE EXAMINATION

## 2016-12-27 DIAGNOSIS — L821 Other seborrheic keratosis: Secondary | ICD-10-CM | POA: Diagnosis not present

## 2016-12-27 DIAGNOSIS — I781 Nevus, non-neoplastic: Secondary | ICD-10-CM | POA: Diagnosis not present

## 2017-01-09 ENCOUNTER — Encounter (INDEPENDENT_AMBULATORY_CARE_PROVIDER_SITE_OTHER): Payer: Self-pay | Admitting: Internal Medicine

## 2017-01-09 ENCOUNTER — Encounter: Payer: Self-pay | Admitting: Nurse Practitioner

## 2017-01-09 ENCOUNTER — Ambulatory Visit (INDEPENDENT_AMBULATORY_CARE_PROVIDER_SITE_OTHER): Payer: 59 | Admitting: Internal Medicine

## 2017-01-09 VITALS — BP 110/90 | HR 70 | Temp 97.8°F | Resp 18 | Ht 66.0 in | Wt 200.9 lb

## 2017-01-09 DIAGNOSIS — R197 Diarrhea, unspecified: Secondary | ICD-10-CM

## 2017-01-09 MED ORDER — METRONIDAZOLE 250 MG PO TABS: 250.0000 mg | ORAL_TABLET | Freq: Three times a day (TID) | ORAL | 0 refills | Status: DC

## 2017-01-09 NOTE — Patient Instructions (Signed)
Please call office with progress report in 10 days.

## 2017-01-10 ENCOUNTER — Encounter (INDEPENDENT_AMBULATORY_CARE_PROVIDER_SITE_OTHER): Payer: Self-pay | Admitting: Internal Medicine

## 2017-01-10 NOTE — Progress Notes (Signed)
Presenting complaint;  Diarrhea and lower abdominal pain.  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 About 3 weeks ago she developed diarrhea and lower abdominal cramping. She also has noted excessive flatus and it is very foul-smelling. She has backed off on using Trulance still having problems. She called our office about 3 weeks ago. Stool O&P were negative and so was fecal lactoferrin. She continues to have loose stools. When she has loose stools she has severe cramping. Other times she has what she stool and in between she may have a normal stool. She denies nausea vomiting fever or chills. No history of travel outside the country or antibiotic use. She does not feel she has lost any weight.  Current Medications: Outpatient Encounter Prescriptions as of 01/09/2017  Medication Sig  . ALPRAZolam (XANAX) 1 MG tablet Take 1 tablet (1 mg total) by mouth every 12 (twelve) hours as needed. for anxiety  . citalopram (CELEXA) 40 MG tablet Take 1 tablet (40 mg total) by mouth daily.  . cycloSPORINE (RESTASIS) 0.05 % ophthalmic emulsion Place 1 drop into both eyes 2 (two) times daily.  . lansoprazole (PREVACID) 30 MG capsule Take 1 capsule (30 mg total) by mouth daily before breakfast.  . levonorgestrel (MIRENA) 20 MCG/24HR IUD 1 each by Intrauterine route once.  Marland Kitchen Plecanatide (TRULANCE) 3 MG TABS Take 3 mg by mouth daily.  Marland Kitchen PRESCRIPTION MEDICATION Taylulla one  A day by GYN  . valACYclovir (VALTREX) 1000 MG tablet TAKE 2 TABLETS NOW THEN TAKE 2 TABLETS IN THE MORNING  . zolpidem (AMBIEN CR) 12.5 MG CR tablet One po qhs prn sleep; do not take within 4 hours of taking Xanax  . FIBER SELECT GUMMIES PO Take by mouth. 2 daily  .    . ondansetron (ZOFRAN) 4 MG tablet TAKE 1 TABLET(4 MG) BY MOUTH TWICE DAILY AS NEEDED FOR NAUSEA OR VOMITING (Patient not taking: Reported on 01/09/2017)   No facility-administered encounter  medications on file as of 01/09/2017.      Objective: Blood pressure 110/90, pulse 70, temperature 97.8 F (36.6 C), temperature source Oral, resp. rate 18, height 5\' 6"  (1.676 m), weight 200 lb 14.4 oz (91.1 kg). Patient is alert and in no acute distress. Conjunctiva is pink. Sclera is nonicteric Oropharyngeal mucosa is normal. No neck masses or thyromegaly noted. Cardiac exam with regular rhythm normal S1 and S2. No murmur or gallop noted. Lungs are clear to auscultation. Abdomen is full. Bowel sounds are normal. On palpation abdomen is soft with mild tenderness at LLQ and RLQ. No organomegaly or masses.  No LE edema or clubbing noted.  Labs/studies Results:  lab data from 12/19/2015 O&P negative Fecal lactoferrin negative.  Assessment:  Recent onset of nonbloody diarrhea associated with abdominal cramping and excessive flatus in a patient with history of chronic constipation but appeared to be due to GI infection. Since she has had symptoms of 3 weeks ago be reasonable to treat her empirically and if she does not get better but proceed with further workup.  Plan:  Metronidazole 250 mg by mouth 3 times a day for 10 days. Patient will call with progress report in 10 days.. Office visit in one year.

## 2017-01-11 ENCOUNTER — Other Ambulatory Visit: Payer: Self-pay | Admitting: Nurse Practitioner

## 2017-01-11 ENCOUNTER — Telehealth: Payer: Self-pay | Admitting: Family Medicine

## 2017-01-11 ENCOUNTER — Other Ambulatory Visit (INDEPENDENT_AMBULATORY_CARE_PROVIDER_SITE_OTHER): Payer: Self-pay | Admitting: *Deleted

## 2017-01-11 MED ORDER — OMEPRAZOLE 20 MG PO CPDR: 20.0000 mg | DELAYED_RELEASE_CAPSULE | Freq: Every day | ORAL | 3 refills | Status: DC

## 2017-01-11 MED ORDER — NALTREXONE-BUPROPION HCL ER 8-90 MG PO TB12: ORAL_TABLET | ORAL | 0 refills | Status: DC

## 2017-01-11 MED FILL — OMEPRAZOLE DR 20 MG CAPSULE: 20 | 90 days supply | Qty: 90 | Fill #0

## 2017-01-11 NOTE — Telephone Encounter (Signed)
Rx prior auth request received from [facility] for pt's Naltrexone-Bupropion HCl ER 8-90 MG  Form completed & faxed to [place], filed in [place] binder, await decision

## 2017-01-11 NOTE — Telephone Encounter (Signed)
Patient sent a telephone advice msg.asking that her PPI be changes as she is on the high deductible plan. Per Dr.Rehman the patient may change to Prilosec 20 mg daily #90 with 3 refills. This has been escribed to [employer]. Patient was sent a patient advice to make her aware of the change.

## 2017-01-16 ENCOUNTER — Encounter: Payer: Self-pay | Admitting: Nurse Practitioner

## 2017-01-16 NOTE — Telephone Encounter (Signed)
Fax rec'd from [place], need more info  ? Pt in a st loss/reduced calorie program?  Need to send [employer] this info  [facility] sent pt a MyChart msg to ask & will forward info given

## 2017-01-26 MED FILL — ALPRAZolam 1 MG TABS: 1 | 30 days supply | Qty: 60 | Fill #0

## 2017-01-28 ENCOUNTER — Encounter (INDEPENDENT_AMBULATORY_CARE_PROVIDER_SITE_OTHER): Payer: Self-pay | Admitting: Internal Medicine

## 2017-01-29 NOTE — Telephone Encounter (Signed)
Pt doesn't meet criteria

## 2017-01-30 ENCOUNTER — Other Ambulatory Visit (INDEPENDENT_AMBULATORY_CARE_PROVIDER_SITE_OTHER): Payer: Self-pay | Admitting: *Deleted

## 2017-01-30 DIAGNOSIS — R1032 Left lower quadrant pain: Secondary | ICD-10-CM

## 2017-01-30 MED FILL — CITALOPRAM HBR 40 MG TABLET: 40 | 90 days supply | Qty: 90 | Fill #0

## 2017-02-02 DIAGNOSIS — R1032 Left lower quadrant pain: Secondary | ICD-10-CM | POA: Diagnosis not present

## 2017-02-03 LAB — HEPATIC FUNCTION PANEL
ALK PHOS: 80 IU/L (ref 39–117)
ALT: 16 IU/L (ref 0–32)
AST: 15 IU/L (ref 0–40)
Albumin: 4 g/dL (ref 3.5–5.5)
BILIRUBIN, DIRECT: 0.07 mg/dL (ref 0.00–0.40)
Bilirubin Total: <0.2 mg/dL (ref 0.0–1.2)
Total Protein: 6.7 g/dL (ref 6.0–8.5)

## 2017-02-08 ENCOUNTER — Ambulatory Visit: Payer: 59 | Admitting: Nurse Practitioner

## 2017-02-19 ENCOUNTER — Encounter (INDEPENDENT_AMBULATORY_CARE_PROVIDER_SITE_OTHER): Payer: Self-pay | Admitting: Internal Medicine

## 2017-02-21 ENCOUNTER — Encounter: Payer: Self-pay | Admitting: Nurse Practitioner

## 2017-02-22 ENCOUNTER — Other Ambulatory Visit: Payer: Self-pay | Admitting: Nurse Practitioner

## 2017-02-22 MED ORDER — ZOLPIDEM TARTRATE ER 12.5 MG PO TBCR: EXTENDED_RELEASE_TABLET | ORAL | 2 refills | Status: DC

## 2017-03-07 ENCOUNTER — Other Ambulatory Visit: Payer: Self-pay

## 2017-03-07 ENCOUNTER — Other Ambulatory Visit: Payer: Self-pay | Admitting: Nurse Practitioner

## 2017-03-07 ENCOUNTER — Other Ambulatory Visit: Payer: Self-pay | Admitting: Family Medicine

## 2017-03-07 DIAGNOSIS — R002 Palpitations: Secondary | ICD-10-CM

## 2017-03-08 ENCOUNTER — Telehealth: Payer: Self-pay | Admitting: Cardiology

## 2017-03-08 NOTE — Telephone Encounter (Signed)
[employer] called and is needing a phone number for this pt.

## 2017-03-08 NOTE — Telephone Encounter (Signed)
Since that visit with Theresa [name] have her review this and decide

## 2017-03-08 NOTE — Telephone Encounter (Signed)
Patient calling [employer] with correct phone numbers

## 2017-03-13 ENCOUNTER — Ambulatory Visit (INDEPENDENT_AMBULATORY_CARE_PROVIDER_SITE_OTHER): Payer: 59

## 2017-03-13 DIAGNOSIS — R002 Palpitations: Secondary | ICD-10-CM | POA: Diagnosis not present

## 2017-03-26 ENCOUNTER — Ambulatory Visit: Payer: 59 | Admitting: Family Medicine

## 2017-03-27 ENCOUNTER — Encounter (INDEPENDENT_AMBULATORY_CARE_PROVIDER_SITE_OTHER): Payer: Self-pay | Admitting: Internal Medicine

## 2017-03-27 MED FILL — ZOLPIDEM TART ER 12.5 MG TA: 12.5 | 30 days supply | Qty: 30 | Fill #0

## 2017-04-04 ENCOUNTER — Encounter: Payer: Self-pay | Admitting: Family Medicine

## 2017-04-05 ENCOUNTER — Encounter: Payer: Self-pay | Admitting: Cardiology

## 2017-04-05 NOTE — Progress Notes (Signed)
Cardiology Office Note  Date: 04/06/2017   ID: SHANECE COCHRANE, DOB 1971/03/24, MRN [id number]  PCP: Mikey Kirschner, MD  Consulting Cardiologist: Rozann Lesches, MD   Chief Complaint  Patient presents with  . Palpitations    History of Present Illness: Theresa [name] is a 46 y.o. female referred for cardiology consultation by Dr. Wolfgang Phoenix for evaluation of palpitations. 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 She describes an episode of irregular palpitations that occurred one day after drinking some red wine. Symptoms lasted for about 10 minutes. Her father has a history of recurring paroxysmal atrial fibrillation, and she is concerned that she may be developing the same diagnosis.  We arranged a cardiac monitor to investigate this further, results outlined below. Other than sinus tachycardia, no other significant findings were noted, specifically no atrial arrhythmias.  She had an echocardiogram in 2015 as detailed below, no major cardiac structural abnormalities.  I reviewed her medications which are outlined below.  I personally reviewed her ECG today which shows normal sinus rhythm.  Past Medical History:  Diagnosis Date  . Allergy   . Anxiety   . Colon adenoma DEC 2014   ONE(7 MM) SIMPLE([place])  . Depression   . Edema   . GERD (gastroesophageal reflux disease)   . History of abnormal cervical Pap smear 12/02/2013  . History of nephrolithiasis   . HPV (human papilloma virus) infection   . Hyperlipidemia   . IBS (irritable bowel syndrome)    constipation  . Insomnia   . Obesity 01/14/2014  . Pre-diabetes 09/2014  . Yeast infection 09/04/2013    Past Surgical History:  Procedure Laterality Date  . BIOPSY N/A 08/01/2013   Procedure: BIOPSY;  Surgeon: Danie Binder, MD;  Location: [facility];  Service: Endoscopy;  Laterality: N/A;  FOR MICROSCOPIC COLITIS  . BREAST REDUCTION SURGERY  2009  . BREAST SURGERY  2007   breast reduction  . CHOLECYSTECTOMY N/A 11/04/2014   Procedure: LAPAROSCOPIC CHOLECYSTECTOMY;  Surgeon: Aviva Signs Md, MD;  Location: [facility];  Service: General;  Laterality: N/A;  . COLONOSCOPY  Oct 2009   Dr. Carlean Purl: int/ext hemorrhoids, ?mild proctitis but path benign, likely r/t irritation from bowel prep  . COLONOSCOPY N/A 08/01/2013   Procedure: COLONOSCOPY;  Surgeon: Danie Binder, MD;  Location: [facility];  Service: Endoscopy;  Laterality: N/A;  12:00  . ESOPHAGOGASTRODUODENOSCOPY  Dec 2009   Dr. Carlean Purl: reflux esophagitis, proximal gastric polyps, benign  . ESOPHAGOGASTRODUODENOSCOPY N/A 12/18/2014   Procedure: ESOPHAGOGASTRODUODENOSCOPY (EGD);  Surgeon: Rogene [place], MD;  Location: [facility];  Service: Endoscopy;  Laterality: N/A;  230  . HEMORRHOID BANDING N/A 08/01/2013   Procedure: HEMORRHOID BANDING;  Surgeon: Danie Binder, MD;  Location: [facility];  Service: Endoscopy;  Laterality: N/A;  internal hemorrhoid banding  . LIVER BIOPSY N/A 11/04/2014   Procedure: LIVER BIOPSY;  Surgeon: Aviva Signs Md, MD;  Location: [facility];  Service: General;  Laterality: N/A;  . POLYPECTOMY  2015   Dr.Fields  . UPPER GASTROINTESTINAL ENDOSCOPY  2006   Dr. Nita Sickle in [place]    Current Outpatient Prescriptions  Medication Sig Dispense Refill  . ALPRAZolam (XANAX) 1 MG tablet TAKE 1 TABLET BY MOUTH EVERY 12 HOURS AS NEEDED FOR ANXIETY 60 tablet 2  . citalopram (CELEXA) 40 MG tablet TAKE 1 TABLET BY MOUTH EVERY DAY 90 tablet 0  . cycloSPORINE (RESTASIS) 0.05 %  ophthalmic emulsion Place 1 drop into both eyes 2 (two) times daily.    Marland Kitchen dexlansoprazole (DEXILANT) 60 MG capsule Take 60 mg by mouth daily.    Marland Kitchen FIBER SELECT GUMMIES PO Take by mouth. 2 daily    . levonorgestrel (MIRENA) 20 MCG/24HR IUD 1 each by Intrauterine route once.    Marland Kitchen Plecanatide (TRULANCE) 3 MG TABS Take 3 mg by mouth daily. 30 tablet 5  . valACYclovir (VALTREX) 1000 MG tablet TAKE 2 TABLETS NOW THEN TAKE  2 TABLETS IN THE MORNING 10 tablet 3  . zolpidem (AMBIEN CR) 12.5 MG CR tablet One po qhs prn sleep; do not take within 4 hours of taking Xanax 30 tablet 2   No current facility-administered medications for this visit.    Allergies:  Patient has no known allergies.   Social History: The patient  reports that she has never smoked. She has never used smokeless tobacco. She reports that she drinks alcohol. She reports that she does not use drugs.   Family History: The patient's family history includes Cancer in her mother; Hyperlipidemia in her father; Hypertension in her father; Pancreatic cancer in her maternal grandfather.   ROS:  Please see the history of present illness. Otherwise, complete review of systems is positive for none.  All other systems are reviewed and negative.   Physical Exam: VS:  BP 124/76   Pulse 84   Ht 5\' 6"  (1.676 m)   Wt 195 lb (88.5 kg)   LMP  (LMP Unknown)   BMI 31.47 kg/m , BMI Body mass index is 31.47 kg/m.  Wt Readings from Last 3 Encounters:  04/06/17 195 lb (88.5 kg)  01/09/17 200 lb 14.4 oz (91.1 kg)  11/08/16 198 lb (89.8 kg)    General: Appears comfortable at rest. HEENT: Conjunctiva and lids normal. Neck: Supple, no elevated JVP or carotid bruits, no thyromegaly. Lungs: Clear to auscultation, nonlabored breathing at rest. Cardiac: Regular rate and rhythm, no S3 or significant systolic murmur, no pericardial rub. Extremities: No pitting edema, distal pulses 2+. Skin: Warm and dry. Musculoskeletal: No kyphosis. Neuropsychiatric: Alert and oriented x3, affect grossly appropriate.  ECG: I personally reviewed the tracing from 09/16/2014 which showed sinus tachycardia with nonspecific T-wave changes.  Recent Labwork: 05/19/2016: BUN 13; Creatinine, Ser 0.81; Hemoglobin 12.8; Platelets 280; Potassium 4.4; Sodium 141; TSH 1.620 02/02/2017: ALT 16; AST 15     Component Value Date/Time   CHOL 206 (H) 05/19/2016 0906   TRIG 121 05/19/2016 0906   HDL  46 05/19/2016 0906   CHOLHDL 4.5 (H) 05/19/2016 0906   LDLCALC 136 (H) 05/19/2016 0906    Other Studies Reviewed Today:  14 day event recorder 03/15/2017: Representative strips from 14 day cardiac monitor reviewed. Baseline rhythm is sinus. Sinus tachycardia noted on July 31 and August 3, heart rate range 105 bpm to 126 bpm.  Echocardiogram 07/22/2014: Study Conclusions  - Left ventricle: The cavity size was normal. Wall thickness was increased in a pattern of mild LVH. Systolic function was normal. The estimated ejection fraction was in the range of 60% to 65%. Wall motion was normal; there were no regional wall motion abnormalities. Left ventricular diastolic function parameters were normal.  Assessment and Plan:  1. Palpitations, no specific arrhythmia documented at this point. Her father has a history of recurring paroxysmal atrial fibrillation, so this does remain a possibility. At this point she has a relatively low thromboembolic risk score, and without any documented arrhythmia, would recommend observation at this  point. 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   2. Hyperlipidemia by history, follows with Dr. Wolfgang Phoenix.  Current medicines were reviewed with the patient today.  Disposition: Follow-up in one year, sooner if needed.  Signed, Satira Sark, MD, [facility] 04/06/2017 9:34 AM    [place] [facility] [place]. [address], [place], [place] [address] Phone: [phone]; Fax: [phone]

## 2017-04-06 ENCOUNTER — Encounter: Payer: Self-pay | Admitting: Cardiology

## 2017-04-06 ENCOUNTER — Ambulatory Visit (INDEPENDENT_AMBULATORY_CARE_PROVIDER_SITE_OTHER): Payer: 59 | Admitting: Cardiology

## 2017-04-06 VITALS — BP 124/76 | HR 84 | Ht 66.0 in | Wt 195.0 lb

## 2017-04-06 DIAGNOSIS — E782 Mixed hyperlipidemia: Secondary | ICD-10-CM | POA: Diagnosis not present

## 2017-04-06 DIAGNOSIS — F339 Major depressive disorder, recurrent, unspecified: Secondary | ICD-10-CM | POA: Diagnosis not present

## 2017-04-06 DIAGNOSIS — R002 Palpitations: Secondary | ICD-10-CM

## 2017-04-06 DIAGNOSIS — F411 Generalized anxiety disorder: Secondary | ICD-10-CM | POA: Diagnosis not present

## 2017-04-06 NOTE — Patient Instructions (Signed)
Medication Instructions:  Your physician recommends that you continue on your current medications as directed. Please refer to the Current Medication list given to you today.   Labwork: NONE   Testing/Procedures: NONE  Follow-Up: Your physician wants you to follow-up in: 1 Year with Dr. McDowell. You will receive a reminder letter in the mail two months in advance. If you don't receive a letter, please call our office to schedule the follow-up appointment.   Any Other Special Instructions Will Be Listed Below (If Applicable).     If you need a refill on your cardiac medications before your next appointment, please call your pharmacy.  Thank you for choosing [facility]!   

## 2017-04-17 ENCOUNTER — Emergency Department (HOSPITAL_COMMUNITY): Payer: PRIVATE HEALTH INSURANCE

## 2017-04-17 ENCOUNTER — Emergency Department (HOSPITAL_COMMUNITY)
Admission: EM | Admit: 2017-04-17 | Discharge: 2017-04-17 | Disposition: A | Discharge status: Home / Self Care | Payer: PRIVATE HEALTH INSURANCE | Attending: Emergency Medicine | Admitting: Emergency Medicine

## 2017-04-17 ENCOUNTER — Encounter (HOSPITAL_COMMUNITY): Payer: Self-pay | Admitting: *Deleted

## 2017-04-17 DIAGNOSIS — S161XXA Strain of muscle, fascia and tendon at neck level, initial encounter: Secondary | ICD-10-CM | POA: Diagnosis not present

## 2017-04-17 DIAGNOSIS — Y93F9 Activity, other caregiving: Secondary | ICD-10-CM | POA: Insufficient documentation

## 2017-04-17 DIAGNOSIS — S199XXA Unspecified injury of neck, initial encounter: Secondary | ICD-10-CM | POA: Diagnosis present

## 2017-04-17 DIAGNOSIS — X509XXA Other and unspecified overexertion or strenuous movements or postures, initial encounter: Secondary | ICD-10-CM | POA: Insufficient documentation

## 2017-04-17 DIAGNOSIS — Y92230 Patient room in hospital as the place of occurrence of the external cause: Secondary | ICD-10-CM | POA: Diagnosis not present

## 2017-04-17 DIAGNOSIS — Y990 Civilian activity done for income or pay: Secondary | ICD-10-CM | POA: Insufficient documentation

## 2017-04-17 DIAGNOSIS — T148XXA Other injury of unspecified body region, initial encounter: Secondary | ICD-10-CM

## 2017-04-17 DIAGNOSIS — S46911A Strain of unspecified muscle, fascia and tendon at shoulder and upper arm level, right arm, initial encounter: Secondary | ICD-10-CM | POA: Diagnosis not present

## 2017-04-17 DIAGNOSIS — Z79899 Other long term (current) drug therapy: Secondary | ICD-10-CM | POA: Diagnosis not present

## 2017-04-17 MED ORDER — IBUPROFEN 600 MG PO TABS: 600.0000 mg | ORAL_TABLET | Freq: Four times a day (QID) | ORAL | 0 refills | Status: DC | PRN

## 2017-04-17 MED ORDER — METHOCARBAMOL 500 MG PO TABS
500.0000 mg | ORAL_TABLET | Freq: Once | ORAL | Status: AC
  Administered 2017-04-17: 500 mg via ORAL
  Filled 2017-04-17: qty 1

## 2017-04-17 MED ORDER — METHOCARBAMOL 500 MG PO TABS: 500.0000 mg | ORAL_TABLET | Freq: Four times a day (QID) | ORAL | 0 refills | Status: DC

## 2017-04-17 MED ORDER — IBUPROFEN 800 MG PO TABS
800.0000 mg | ORAL_TABLET | Freq: Once | ORAL | Status: AC
  Administered 2017-04-17: 800 mg via ORAL
  Filled 2017-04-17: qty 1

## 2017-04-17 NOTE — ED Triage Notes (Signed)
Pt is an employee with [employer], working in the [employer]. States they have somewhat high chairs to sit in. She was sitting in that chair and had her other hand on the bed rail of the patient she was taking car of when she fell back off of the chair. She tried to stabilize herself with her right arm. Pt never hit hear head or neck on anything but is having neck and back pain. Denies any loc.

## 2017-04-17 NOTE — Discharge Instructions (Signed)
Take the medicines as directed.  Do not drive within 4 hours of taking robaxin as this may cause drowsiness.  Avoid lifting,  Bending,  Twisting or any other activity that worsens your pain over the next week.  Apply an  icepack  to your neck, shoulder and back for 10-15 minutes every 2 hours for the next 2 days. You may add a heating pad for 20 minutes 3 times daily starting on day 3.  You should get rechecked if your symptoms are not better over the next 5 days,  Or you develop increased pain,  or weakness in your extremities as this can be symptoms of a worsening condition (such as a cervical disk injury).

## 2017-04-18 ENCOUNTER — Other Ambulatory Visit: Payer: Self-pay | Admitting: Nurse Practitioner

## 2017-04-18 ENCOUNTER — Encounter: Payer: Self-pay | Admitting: Nurse Practitioner

## 2017-04-18 NOTE — ED Provider Notes (Signed)
[place] DEPT Provider Note   CSN: [id number] Arrival date & time: 04/17/17  1155     History   Chief Complaint Chief Complaint  Patient presents with  . Back Pain    HPI Theresa [name] is a 46 y.o. female with history as outlined below and no history of neck, back or shoulder pain presents with pain after injury at work.  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  She was hanging onto the rail with her right hand and tried to catch herself, which she did, not hitting the floor but has had increasing pain and stiffness in the right neck through shoulder area since the event 2 hours ago. She has had no treatment prior to arrival.  She denies numbness or weakness in her extremities and did not hit her head.  The history is provided by the patient.    Past Medical History:  Diagnosis Date  . Allergy   . Anxiety   . Colon adenoma DEC 2014   ONE(7 MM) SIMPLE([place])  . Depression   . Edema   . GERD (gastroesophageal reflux disease)   . History of abnormal cervical Pap smear 12/02/2013  . History of nephrolithiasis   . HPV (human papilloma virus) infection   . Hyperlipidemia   . IBS (irritable bowel syndrome)    constipation  . Insomnia   . Obesity 01/14/2014  . Pre-diabetes 09/2014  . Yeast infection 09/04/2013    Patient Active Problem List   Diagnosis Date Noted  . Fatigue 07/12/2015  . Arthralgia 07/12/2015  . AP (abdominal pain) 07/06/2015  . Obesity 01/14/2014  . History of abnormal cervical Pap smear 12/02/2013  . Internal hemorrhoids with other complication 70/96/2836  . Generalized anxiety disorder 12/25/2012  . GERD (gastroesophageal reflux disease) 10/31/2011  . GASTROESOPHAGEAL REFLUX DISEASE 07/13/2008  . HEARTBURN 07/13/2008  . CONSTIPATION 05/13/2008  . BLOOD IN STOOL 05/13/2008  . CHANGE IN BOWELS 05/13/2008    Past Surgical History:  Procedure Laterality Date  . BIOPSY N/A 08/01/2013   Procedure: BIOPSY;  Surgeon: Danie Binder, MD;  Location: [facility];  Service: Endoscopy;  Laterality: N/A;  FOR MICROSCOPIC COLITIS  . BREAST REDUCTION SURGERY  2009  . BREAST SURGERY  2007   breast reduction  . CHOLECYSTECTOMY N/A 11/04/2014   Procedure: LAPAROSCOPIC CHOLECYSTECTOMY;  Surgeon: Aviva Signs Md, MD;  Location: [facility] ORS;  Service: General;  Laterality: N/A;  . COLONOSCOPY  Oct 2009   Dr. Carlean Purl: int/ext hemorrhoids, ?mild proctitis but path benign, likely r/t irritation from bowel prep  . COLONOSCOPY N/A 08/01/2013   Procedure: COLONOSCOPY;  Surgeon: Danie Binder, MD;  Location: [facility];  Service: Endoscopy;  Laterality: N/A;  12:00  . ESOPHAGOGASTRODUODENOSCOPY  Dec 2009   Dr. Carlean Purl: reflux esophagitis, proximal gastric polyps, benign  . ESOPHAGOGASTRODUODENOSCOPY N/A 12/18/2014   Procedure: ESOPHAGOGASTRODUODENOSCOPY (EGD);  Surgeon: Rogene Houston, MD;  Location: [facility];  Service: Endoscopy;  Laterality: N/A;  230  . HEMORRHOID BANDING N/A 08/01/2013   Procedure: HEMORRHOID BANDING;  Surgeon: Danie Binder, MD;  Location: [facility];  Service: Endoscopy;  Laterality: N/A;  internal hemorrhoid banding  . LIVER BIOPSY N/A 11/04/2014   Procedure: LIVER BIOPSY;  Surgeon: Aviva Signs Md, MD;  Location: [facility] ORS;  Service: General;  Laterality: N/A;  . POLYPECTOMY  2015   Dr.Fields  . UPPER GASTROINTESTINAL ENDOSCOPY  2006   Dr.  Gressner in [place]    [place] History    Gravida Para Term Preterm AB Living   1 1       1    SAB TAB Ectopic Multiple Live Births           1       Home Medications    Prior to Admission medications   Medication Sig Start Date End Date Taking? Authorizing Provider  ALPRAZolam Duanne Moron) 1 MG tablet TAKE 1 TABLET BY MOUTH EVERY 12 HOURS AS NEEDED FOR ANXIETY 03/08/17   Nilda Simmer, NP  citalopram (CELEXA) 40 MG tablet TAKE 1 TABLET BY MOUTH EVERY DAY 03/08/17   Nilda Simmer, NP  cycloSPORINE (RESTASIS) 0.05 %  ophthalmic emulsion Place 1 drop into both eyes 2 (two) times daily.    [provider]  dexlansoprazole (DEXILANT) 60 MG capsule Take 60 mg by mouth daily.    [provider]  FIBER SELECT GUMMIES PO Take by mouth. 2 daily    [provider]  ibuprofen (ADVIL,MOTRIN) 600 MG tablet Take 1 tablet (600 mg total) by mouth every 6 (six) hours as needed. 04/17/17   Evalee Jefferson, PA-C  levonorgestrel (MIRENA) 20 MCG/24HR IUD 1 each by Intrauterine route once.    [provider]  methocarbamol (ROBAXIN) 500 MG tablet Take 1 tablet (500 mg total) by mouth 4 (four) times daily. 04/17/17 04/27/17  IdolAlmyra Free, PA-C  Plecanatide (TRULANCE) 3 MG TABS Take 3 mg by mouth daily. 07/10/16   Rogene Houston, MD  valACYclovir (VALTREX) 1000 MG tablet TAKE 2 TABLETS NOW THEN TAKE 2 TABLETS IN THE MORNING 02/23/15   Estill Dooms, NP  zolpidem (AMBIEN CR) 12.5 MG CR tablet One po qhs prn sleep; do not take within 4 hours of taking Xanax 02/22/17   Nilda Simmer, NP    Family History Family History  Problem Relation Age of Onset  . Cancer Mother        head and neck  . Hypertension Father   . Hyperlipidemia Father   . Pancreatic cancer Maternal Grandfather   . Colon cancer Neg Hx     Social History Social History  Substance Use Topics  . Smoking status: Never Smoker  . Smokeless tobacco: Never Used     Comment: Never smoker  . Alcohol use 0.0 oz/week     Comment: socially     Allergies   Patient has no known allergies.   Review of Systems Review of Systems  Constitutional: Negative for fever.  Respiratory: Negative for shortness of breath.   Cardiovascular: Negative for chest pain and leg swelling.  Gastrointestinal: Negative for abdominal distention, abdominal pain and constipation.  Genitourinary: Negative for difficulty urinating, dysuria, flank pain, frequency and urgency.  Musculoskeletal: Positive for arthralgias and neck pain. Negative for back  pain, gait problem and joint swelling.  Skin: Negative for rash.  Neurological: Negative for weakness and numbness.     Physical Exam Updated Vital Signs BP 127/79 (BP Location: Right Arm)   Pulse 84   Temp 97.7 F (36.5 C) (Temporal)   Resp 18   Ht 5\' 6"  (1.676 m)   Wt 90.7 kg (200 lb)   LMP  (LMP Unknown)   SpO2 97%   BMI 32.28 kg/m   Physical Exam  Constitutional: She appears well-developed and well-nourished.  HENT:  Head: Normocephalic.  Neck: Muscular tenderness present. No neck rigidity. Decreased range of motion present. No edema and no erythema present.  Pain  with right head rotation along the right neck and upper shoulder.  Cardiovascular: Normal rate and intact distal pulses.   Pulses:      Radial pulses are 2+ on the right side, and 2+ on the left side.  Pedal pulses normal.  Pulmonary/Chest: Effort normal.  Musculoskeletal: She exhibits no edema.       Right shoulder: She exhibits bony tenderness. She exhibits no swelling and no deformity.       Cervical back: She exhibits tenderness. She exhibits no bony tenderness and no deformity.       Thoracic back: Normal.       Lumbar back: Normal.  Neurological: She is alert. She has normal strength. She displays no atrophy and no tremor. No sensory deficit. Gait normal.  Reflex Scores:      Patellar reflexes are 2+ on the right side and 2+ on the left side.      Achilles reflexes are 2+ on the right side and 2+ on the left side. No strength deficit noted in hip and knee flexor and extensor muscle groups.  Ankle flexion and extension intact.  Skin: Skin is warm and dry.  Psychiatric: She has a normal mood and affect.  Nursing note and vitals reviewed.    ED Treatments / Results  Labs (all labs ordered are listed, but only abnormal results are displayed) Labs Reviewed - No data to display  EKG  EKG Interpretation None       Radiology Dg Cervical Spine Complete  Result Date: 04/17/2017 CLINICAL DATA:   Fall from chair today with right-sided neck pain. Initial encounter. EXAM: CERVICAL SPINE - COMPLETE 4+ VIEW COMPARISON:  11/07/2005 FINDINGS: There is no evidence of cervical spine fracture or prevertebral soft tissue swelling. Alignment is normal. No other significant bone abnormalities are identified. IMPRESSION: Negative cervical spine radiographs. Electronically Signed   By: Monte Fantasia M.D.   On: 04/17/2017 13:03   Dg Shoulder Right  Result Date: 04/17/2017 CLINICAL DATA:  [place] out of a chair today. EXAM: RIGHT SHOULDER - 2+ VIEW COMPARISON:  None. FINDINGS: Mild to moderate AC joint and glenohumeral joint degenerative changes for age but no fracture or dislocation. No bone lesion. No abnormal soft tissue calcifications. The visualized right lung is clear and the visualized right ribs are intact. IMPRESSION: Mild-to-moderate degenerative changes but no acute bony findings. Electronically Signed   By: Marijo Sanes M.D.   On: 04/17/2017 13:04   Dg Elbow Complete Right  Result Date: 04/17/2017 CLINICAL DATA:  [place] out of chair today. EXAM: RIGHT ELBOW - COMPLETE 3+ VIEW COMPARISON:  None. FINDINGS: Mild degenerative changes with tug type lesions involving the medial and lateral epicondyles. No acute bony findings, osteochondral lesion or joint effusion. IMPRESSION: No acute fracture. Electronically Signed   By: Marijo Sanes M.D.   On: 04/17/2017 13:05    Procedures Procedures (including critical care time)  Medications Ordered in ED Medications  methocarbamol (ROBAXIN) tablet 500 mg (500 mg Oral Given 04/17/17 1258)  ibuprofen (ADVIL,MOTRIN) tablet 800 mg (800 mg Oral Given 04/17/17 1258)     Initial Impression / Assessment and Plan / ED Course  I have reviewed the triage vital signs and the nursing notes.  Pertinent labs & imaging results that were available during my care of the patient were reviewed by me and considered in my medical decision making (see chart for details).      Imaging reviewed and discussed.  Robaxin, ibuprofen, ice and heat tx discussed.  Recheck  with occupational med recommended.    The patient appears reasonably screened and/or stabilized for discharge and I doubt any other medical condition or other [facility] requiring further screening, evaluation, or treatment in the ED at this time prior to discharge.   Final Clinical Impressions(s) / ED Diagnoses   Final diagnoses:  Muscle strain    New Prescriptions Discharge Medication List as of 04/17/2017  1:37 PM    START taking these medications   Details  ibuprofen (ADVIL,MOTRIN) 600 MG tablet Take 1 tablet (600 mg total) by mouth every 6 (six) hours as needed., Starting Tue 04/17/2017, Print    methocarbamol (ROBAXIN) 500 MG tablet Take 1 tablet (500 mg total) by mouth 4 (four) times daily., Starting Tue 04/17/2017, Until Fri 04/27/2017, Print         Glorimar Stroope, [place], PA-C 04/18/17 4332    Tanna Furry, MD 04/28/17 2146

## 2017-04-24 ENCOUNTER — Encounter (INDEPENDENT_AMBULATORY_CARE_PROVIDER_SITE_OTHER): Payer: Self-pay | Admitting: Internal Medicine

## 2017-04-25 ENCOUNTER — Encounter: Payer: Self-pay | Admitting: Nurse Practitioner

## 2017-04-25 ENCOUNTER — Ambulatory Visit (INDEPENDENT_AMBULATORY_CARE_PROVIDER_SITE_OTHER): Payer: 59 | Admitting: Nurse Practitioner

## 2017-04-25 VITALS — BP 122/76 | Ht 66.0 in | Wt 205.2 lb

## 2017-04-25 DIAGNOSIS — G4700 Insomnia, unspecified: Secondary | ICD-10-CM | POA: Diagnosis not present

## 2017-04-25 DIAGNOSIS — F411 Generalized anxiety disorder: Secondary | ICD-10-CM

## 2017-04-25 MED ORDER — BUPROPION HCL ER (XL) 150 MG PO TB24: 150.0000 mg | ORAL_TABLET | Freq: Every day | ORAL | 2 refills | Status: DC

## 2017-04-25 MED ORDER — ZOLPIDEM TARTRATE ER 12.5 MG PO TBCR: EXTENDED_RELEASE_TABLET | ORAL | 2 refills | Status: DC

## 2017-04-26 ENCOUNTER — Encounter: Payer: Self-pay | Admitting: Nurse Practitioner

## 2017-04-26 NOTE — Progress Notes (Signed)
Subjective:  Presents for routine follow-up on her anxiety. Has been on Celexa since 2011. Began when her mother had cancer. Had been working fairly well until recently, has had significant increase in stress due to personal issues. Is struggling with dealing with her stepdaughter. Her husband has partial custody. Has gone to 2 different counselors in the past which did not work out well so she did not continue. Denies suicidal or homicidal thoughts or ideation. Has taken Wellbutrin in the past, does not remember any significant adverse effects. Continues to take Ambien at night for sleep. Takes up to 2 Xanax per day as needed for extreme stress. Does not take within 4 hours of taking Ambien.  Objective:   BP 122/76   Ht 5\' 6"  (1.676 m)   Wt 205 lb 3.2 oz (93.1 kg)   LMP  (LMP Unknown)   BMI 33.12 kg/m  NAD. Alert, oriented. Lungs clear. Heart regular rate rhythm. Thoughts logical coherent and relevant. Mildly anxious affect. Dressed appropriately. Making good eye contact.  Assessment:   Problem List Items Addressed This Visit      Other   Generalized anxiety disorder - Primary    Other Visit Diagnoses    Insomnia, unspecified type            Plan:   Meds ordered this encounter  Medications  . buPROPion (WELLBUTRIN XL) 150 MG 24 hr tablet    Sig: Take 1 tablet (150 mg total) by mouth daily.    Dispense:  30 tablet    Refill:  2    Order Specific Question:   Supervising Provider    Answer:   Mikey Kirschner [[id number]]  . zolpidem (AMBIEN CR) 12.5 MG CR tablet    Sig: One po qhs prn sleep; do not take within 4 hours of taking Xanax    Dispense:  30 tablet    Refill:  2    Order Specific Question:   Supervising Provider    Answer:   Mikey Kirschner [[id number]]   Continue Celexa as directed. Reluctant to switch medications at this time due to length of therapy and extreme anxiety. Add Wellbutrin to her regimen. Continue other meds as directed. Call back if any problems with Wellbutrin.  Otherwise recheck in 3 months.

## 2017-04-27 MED FILL — ZOLPIDEM TART ER 12.5 MG TA: 12.5 | 30 days supply | Qty: 30 | Fill #0

## 2017-05-07 ENCOUNTER — Encounter: Payer: Self-pay | Admitting: Nurse Practitioner

## 2017-05-15 ENCOUNTER — Encounter (INDEPENDENT_AMBULATORY_CARE_PROVIDER_SITE_OTHER): Payer: Self-pay | Admitting: Internal Medicine

## 2017-05-16 ENCOUNTER — Encounter (INDEPENDENT_AMBULATORY_CARE_PROVIDER_SITE_OTHER): Payer: Self-pay | Admitting: *Deleted

## 2017-05-18 ENCOUNTER — Other Ambulatory Visit: Payer: Self-pay | Admitting: Nurse Practitioner

## 2017-05-18 ENCOUNTER — Encounter: Payer: Self-pay | Admitting: Nurse Practitioner

## 2017-05-18 MED ORDER — BUPROPION HCL ER (XL) 150 MG PO TB24
150.0000 mg | ORAL_TABLET | Freq: Every day | ORAL | 1 refills | Status: DC
Start: 2017-05-18 — End: 2017-11-20

## 2017-05-18 MED ORDER — CITALOPRAM HYDROBROMIDE 40 MG PO TABS: 40.0000 mg | ORAL_TABLET | Freq: Every day | ORAL | 1 refills | Status: DC

## 2017-05-18 MED FILL — CITALOPRAM HBR 40 MG TABLET: 40 | 90 days supply | Qty: 90 | Fill #0

## 2017-05-21 MED FILL — BUPROPION HCL XL 150 MG TAB: 150 | 90 days supply | Qty: 90 | Fill #0

## 2017-05-22 ENCOUNTER — Other Ambulatory Visit: Payer: Self-pay | Admitting: Nurse Practitioner

## 2017-05-22 DIAGNOSIS — F411 Generalized anxiety disorder: Secondary | ICD-10-CM

## 2017-05-29 ENCOUNTER — Encounter (INDEPENDENT_AMBULATORY_CARE_PROVIDER_SITE_OTHER): Payer: Self-pay | Admitting: Internal Medicine

## 2017-05-30 ENCOUNTER — Other Ambulatory Visit (INDEPENDENT_AMBULATORY_CARE_PROVIDER_SITE_OTHER): Payer: Self-pay | Admitting: *Deleted

## 2017-05-30 MED ORDER — DEXLANSOPRAZOLE 60 MG PO CPDR: 60.0000 mg | DELAYED_RELEASE_CAPSULE | Freq: Every day | ORAL | 5 refills | Status: DC

## 2017-06-05 ENCOUNTER — Encounter (INDEPENDENT_AMBULATORY_CARE_PROVIDER_SITE_OTHER): Payer: Self-pay | Admitting: Internal Medicine

## 2017-06-06 ENCOUNTER — Other Ambulatory Visit: Payer: Self-pay | Admitting: Nurse Practitioner

## 2017-06-06 ENCOUNTER — Encounter: Payer: Self-pay | Admitting: Nurse Practitioner

## 2017-06-06 MED ORDER — ALPRAZOLAM 1 MG PO TABS: 1.0000 mg | ORAL_TABLET | Freq: Two times a day (BID) | ORAL | 2 refills | Status: DC | PRN

## 2017-06-06 MED FILL — ALPRAZolam 1 MG TABS: 1 | 30 days supply | Qty: 60 | Fill #0

## 2017-06-13 ENCOUNTER — Encounter (INDEPENDENT_AMBULATORY_CARE_PROVIDER_SITE_OTHER): Payer: Self-pay | Admitting: Internal Medicine

## 2017-06-18 ENCOUNTER — Encounter: Payer: Self-pay | Admitting: Nurse Practitioner

## 2017-06-18 ENCOUNTER — Encounter (INDEPENDENT_AMBULATORY_CARE_PROVIDER_SITE_OTHER): Payer: Self-pay | Admitting: *Deleted

## 2017-06-19 ENCOUNTER — Other Ambulatory Visit: Payer: Self-pay | Admitting: Nurse Practitioner

## 2017-06-19 DIAGNOSIS — R5383 Other fatigue: Secondary | ICD-10-CM

## 2017-06-19 DIAGNOSIS — Z79899 Other long term (current) drug therapy: Secondary | ICD-10-CM

## 2017-06-19 DIAGNOSIS — Z131 Encounter for screening for diabetes mellitus: Secondary | ICD-10-CM

## 2017-06-19 DIAGNOSIS — Z13220 Encounter for screening for lipoid disorders: Secondary | ICD-10-CM

## 2017-06-21 ENCOUNTER — Encounter: Payer: Self-pay | Admitting: Nurse Practitioner

## 2017-06-21 DIAGNOSIS — Z131 Encounter for screening for diabetes mellitus: Secondary | ICD-10-CM | POA: Diagnosis not present

## 2017-06-21 DIAGNOSIS — R5383 Other fatigue: Secondary | ICD-10-CM | POA: Diagnosis not present

## 2017-06-21 DIAGNOSIS — Z13220 Encounter for screening for lipoid disorders: Secondary | ICD-10-CM | POA: Diagnosis not present

## 2017-06-21 DIAGNOSIS — Z79899 Other long term (current) drug therapy: Secondary | ICD-10-CM | POA: Diagnosis not present

## 2017-06-22 ENCOUNTER — Other Ambulatory Visit: Payer: Self-pay

## 2017-06-22 ENCOUNTER — Telehealth: Payer: Self-pay

## 2017-06-22 ENCOUNTER — Encounter: Payer: Self-pay | Admitting: Nurse Practitioner

## 2017-06-22 ENCOUNTER — Other Ambulatory Visit: Payer: Self-pay | Admitting: Nurse Practitioner

## 2017-06-22 DIAGNOSIS — Z79899 Other long term (current) drug therapy: Secondary | ICD-10-CM

## 2017-06-22 DIAGNOSIS — R7309 Other abnormal glucose: Secondary | ICD-10-CM

## 2017-06-22 DIAGNOSIS — Z13220 Encounter for screening for lipoid disorders: Secondary | ICD-10-CM

## 2017-06-22 DIAGNOSIS — R5383 Other fatigue: Secondary | ICD-10-CM

## 2017-06-22 LAB — BASIC METABOLIC PANEL
BUN/Creatinine Ratio: 14 (ref 9–23)
BUN: 12 mg/dL (ref 6–24)
CO2: 25 mmol/L (ref 20–29)
Calcium: 9.1 mg/dL (ref 8.7–10.2)
Chloride: 100 mmol/L (ref 96–106)
Creatinine, Ser: 0.86 mg/dL (ref 0.57–1.00)
GFR, EST AFRICAN AMERICAN: 94 mL/min/{1.73_m2} (ref >59)
GFR, EST NON AFRICAN AMERICAN: 82 mL/min/{1.73_m2} (ref >59)
Glucose: 108 mg/dL — ABNORMAL HIGH (ref 65–99)
POTASSIUM: 4.4 mmol/L (ref 3.5–5.2)
SODIUM: 140 mmol/L (ref 134–144)

## 2017-06-22 LAB — HEMOGLOBIN A1C
Est. average glucose Bld gHb Est-mCnc: 120 mg/dL
Hgb A1c MFr Bld: 5.8 % — ABNORMAL HIGH (ref 4.8–5.6)

## 2017-06-22 LAB — LIPID PANEL
CHOL/HDL RATIO: 5 ratio — AB (ref 0.0–4.4)
Cholesterol, Total: 219 mg/dL — ABNORMAL HIGH (ref 100–199)
HDL: 44 mg/dL (ref >39)
LDL CALC: 151 mg/dL — AB (ref 0–99)
Triglycerides: 118 mg/dL (ref 0–149)
VLDL Cholesterol Cal: 24 mg/dL (ref 5–40)

## 2017-06-22 LAB — HEPATIC FUNCTION PANEL
ALBUMIN: 4.4 g/dL (ref 3.5–5.5)
ALT: 24 IU/L (ref 0–32)
AST: 20 IU/L (ref 0–40)
Alkaline Phosphatase: 84 IU/L (ref 39–117)
BILIRUBIN TOTAL: 0.4 mg/dL (ref 0.0–1.2)
BILIRUBIN, DIRECT: 0.11 mg/dL (ref 0.00–0.40)
TOTAL PROTEIN: 7.1 g/dL (ref 6.0–8.5)

## 2017-06-22 LAB — CBC WITH DIFFERENTIAL/PLATELET
BASOS: 1 %
Basophils Absolute: 0.1 10*3/uL (ref 0.0–0.2)
EOS (ABSOLUTE): 0.4 10*3/uL (ref 0.0–0.4)
EOS: 6 %
HEMATOCRIT: 37.9 % (ref 34.0–46.6)
Hemoglobin: 12.8 g/dL (ref 11.1–15.9)
Immature Grans (Abs): 0 10*3/uL (ref 0.0–0.1)
Immature Granulocytes: 0 %
LYMPHS ABS: 1.6 10*3/uL (ref 0.7–3.1)
Lymphs: 22 %
MCH: 29.1 pg (ref 26.6–33.0)
MCHC: 33.8 g/dL (ref 31.5–35.7)
MCV: 86 fL (ref 79–97)
MONOS ABS: 0.3 10*3/uL (ref 0.1–0.9)
Monocytes: 4 %
NEUTROS PCT: 67 %
Neutrophils Absolute: 5 10*3/uL (ref 1.4–7.0)
Platelets: 295 10*3/uL (ref 150–379)
RBC: 4.4 x10E6/uL (ref 3.77–5.28)
RDW: 13.7 % (ref 12.3–15.4)
WBC: 7.4 10*3/uL (ref 3.4–10.8)

## 2017-06-22 MED ORDER — METFORMIN HCL ER 500 MG PO TB24
500.0000 mg | ORAL_TABLET | Freq: Every day | ORAL | 1 refills | Status: DC
Start: 2017-06-22 — End: 2017-11-17

## 2017-06-22 MED FILL — METFORMIN HCL ER 500 MG TAB: 500 | 90 days supply | Qty: 90 | Fill #0

## 2017-06-22 NOTE — Telephone Encounter (Signed)
done

## 2017-06-22 NOTE — Telephone Encounter (Signed)
Patient states you were going to send in Metformin to [place].Please advise.

## 2017-06-22 NOTE — Telephone Encounter (Signed)
Patient had blood work done

## 2017-07-03 ENCOUNTER — Encounter (INDEPENDENT_AMBULATORY_CARE_PROVIDER_SITE_OTHER): Payer: Self-pay | Admitting: Internal Medicine

## 2017-07-09 MED FILL — ALPRAZolam 1 MG TABS: 1 | 30 days supply | Qty: 60 | Fill #1

## 2017-07-10 DIAGNOSIS — H5203 Hypermetropia, bilateral: Secondary | ICD-10-CM | POA: Diagnosis not present

## 2017-07-10 DIAGNOSIS — H524 Presbyopia: Secondary | ICD-10-CM | POA: Diagnosis not present

## 2017-07-10 DIAGNOSIS — H52223 Regular astigmatism, bilateral: Secondary | ICD-10-CM | POA: Diagnosis not present

## 2017-07-27 ENCOUNTER — Ambulatory Visit (INDEPENDENT_AMBULATORY_CARE_PROVIDER_SITE_OTHER): Payer: 59 | Admitting: Nurse Practitioner

## 2017-07-27 ENCOUNTER — Encounter: Payer: Self-pay | Admitting: Nurse Practitioner

## 2017-07-27 VITALS — BP 128/84 | Ht 66.0 in | Wt 204.0 lb

## 2017-07-27 DIAGNOSIS — F419 Anxiety disorder, unspecified: Secondary | ICD-10-CM

## 2017-07-27 DIAGNOSIS — F329 Major depressive disorder, single episode, unspecified: Secondary | ICD-10-CM

## 2017-07-27 DIAGNOSIS — R5383 Other fatigue: Secondary | ICD-10-CM

## 2017-07-28 ENCOUNTER — Encounter: Payer: Self-pay | Admitting: Nurse Practitioner

## 2017-07-28 DIAGNOSIS — F419 Anxiety disorder, unspecified: Principal | ICD-10-CM

## 2017-07-28 DIAGNOSIS — F329 Major depressive disorder, single episode, unspecified: Secondary | ICD-10-CM | POA: Insufficient documentation

## 2017-07-28 NOTE — Progress Notes (Signed)
Subjective:  Presents for recheck on her anxiety and depression. Doing much better. Current regimen working well. Family situation has gotten better. Her relationship with her husband is better as well. Has had multiple workups and labs over the past few years due to fatigue and a recurrent rash in the ankles with associated joint pain. Occurs about every 2 weeks and spontaneously resolves. Has seen rheumatology and dermatology. Now has dry mouth and eyes. On Restasis. Questions whether she may have Sjogren's syndrome.  Depression screen [facility] 2/9 07/27/2017 07/12/2015  Decreased Interest 0 0  Down, Depressed, Hopeless 0 0  PHQ - 2 Score 0 0  Altered sleeping 0 -  Tired, decreased energy 2 -  Change in appetite 3 -  Feeling bad or failure about yourself  0 -  Trouble concentrating 0 -  Moving slowly or fidgety/restless 0 -  Suicidal thoughts 0 -  PHQ-9 Score 5 -     Objective:   BP 128/84   Ht 5\' 6"  (1.676 m)   Wt 204 lb (92.5 kg)   BMI 32.93 kg/m  NAD. Alert, oriented. Calm, cheerful affect. Lungs clear. Heart RRR. A picture on her phone of her active rash shows a large cluster of mildly erythematous papules on the ankle area.   Assessment:   Problem List Items Addressed This Visit      Other   Anxiety and depression - Primary   Fatigue       Plan:  Continue current meds as directed. A my chart note was sent to patient regarding a review of Sjogrens after the visit. See message 07/28/17. Return in about 6 months (around 01/25/2018) for recheck.

## 2017-07-30 ENCOUNTER — Encounter: Payer: Self-pay | Admitting: Nurse Practitioner

## 2017-08-01 ENCOUNTER — Encounter (INDEPENDENT_AMBULATORY_CARE_PROVIDER_SITE_OTHER): Payer: Self-pay | Admitting: Internal Medicine

## 2017-08-03 ENCOUNTER — Encounter: Payer: Self-pay | Admitting: Nurse Practitioner

## 2017-08-08 MED FILL — ALPRAZolam 1 MG TABS: 1 | 30 days supply | Qty: 60 | Fill #2

## 2017-08-10 ENCOUNTER — Encounter: Payer: Self-pay | Admitting: Nurse Practitioner

## 2017-08-16 ENCOUNTER — Encounter: Payer: Self-pay | Admitting: Nurse Practitioner

## 2017-08-20 MED FILL — BUPROPION HCL XL 150 MG TAB: 150 | 90 days supply | Qty: 90 | Fill #1

## 2017-08-20 MED FILL — RESTASIS 0.05% EYE EMULSION: 0.05 | 90 days supply | Qty: 180 | Fill #0

## 2017-08-20 MED FILL — CITALOPRAM HBR 40 MG TABLET: 40 | 90 days supply | Qty: 90 | Fill #1

## 2017-08-21 ENCOUNTER — Encounter: Payer: Self-pay | Admitting: Nurse Practitioner

## 2017-08-21 ENCOUNTER — Encounter: Payer: Self-pay | Admitting: Family Medicine

## 2017-08-21 ENCOUNTER — Ambulatory Visit (INDEPENDENT_AMBULATORY_CARE_PROVIDER_SITE_OTHER): Payer: 59 | Admitting: Nurse Practitioner

## 2017-08-21 VITALS — BP 128/82 | Temp 98.3°F | Ht 66.0 in | Wt 204.0 lb

## 2017-08-21 DIAGNOSIS — B2700 Gammaherpesviral mononucleosis without complication: Secondary | ICD-10-CM | POA: Insufficient documentation

## 2017-08-21 DIAGNOSIS — R5383 Other fatigue: Secondary | ICD-10-CM | POA: Diagnosis not present

## 2017-08-21 DIAGNOSIS — R894 Abnormal immunological findings in specimens from other organs, systems and tissues: Secondary | ICD-10-CM

## 2017-08-21 DIAGNOSIS — J0100 Acute maxillary sinusitis, unspecified: Secondary | ICD-10-CM | POA: Diagnosis not present

## 2017-08-21 HISTORY — DX: Gammaherpesviral mononucleosis without complication: B27.00

## 2017-08-21 MED ORDER — AMOXICILLIN-POT CLAVULANATE 875-125 MG PO TABS: 1.0000 | ORAL_TABLET | Freq: Two times a day (BID) | ORAL | 0 refills | Status: DC

## 2017-08-21 MED ORDER — METHYLPREDNISOLONE ACETATE 40 MG/ML IJ SUSP
40.0000 mg | Freq: Once | INTRAMUSCULAR | Status: AC
  Administered 2017-08-21: 40 mg via INTRAMUSCULAR

## 2017-08-21 MED ORDER — MUPIROCIN 2 % EX OINT: 1.0000 "application " | TOPICAL_OINTMENT | Freq: Two times a day (BID) | CUTANEOUS | 0 refills | Status: DC

## 2017-08-21 MED ORDER — PREDNISONE 20 MG PO TABS: ORAL_TABLET | ORAL | 0 refills | Status: DC

## 2017-08-21 NOTE — Progress Notes (Signed)
Subjective: Presents for complaints of sinus symptoms over the past few weeks, worse over the past couple of days.  Right maxillary area sinus pressure.  No fever but some night sweats.  Sore throat slightly better.  Rare cough.  Postnasal drainage.  Tender lymph node on the right side.  States she has had some soreness inside both of her nostrils more on the right.  Patient mentions she has had an elevated EBV panel in the past.  See previous notes.  Objective:   BP 128/82   Temp 98.3 F (36.8 C) (Oral)   Ht 5\' 6"  (1.676 m)   Wt 204 lb (92.5 kg)   BMI 32.93 kg/m  NAD.  Alert, oriented.  TMs retracted, more on the right.  No erythema.  Nasal mucosa erythematous and dry more on the right.  Pharynx injected with PND noted.  Neck supple with mild soft anterior adenopathy, tender on the right.  No posterior lymphadenopathy noted.  Lungs clear.  Heart regular rate and rhythm.  Scanned lab report shows elevated EBV panel 05/17/2015.  Assessment:   Problem List Items Addressed This Visit      Other   Fatigue   Positive test for Epstein-Barr virus (EBV)    Other Visit Diagnoses    Acute non-recurrent maxillary sinusitis    -  Primary   Relevant Medications   amoxicillin-clavulanate (AUGMENTIN) 875-125 MG tablet   predniSONE (DELTASONE) 20 MG tablet   methylPREDNISolone acetate (DEPO-MEDROL) injection 40 mg (Completed)       Plan:   Meds ordered this encounter  Medications  . mupirocin ointment (BACTROBAN) 2 %    Sig: Place 1 application into the nose 2 (two) times daily.    Dispense:  22 g    Refill:  0    Order Specific Question:   Supervising Provider    Answer:   Mikey Kirschner [[id number]]  . amoxicillin-clavulanate (AUGMENTIN) 875-125 MG tablet    Sig: Take 1 tablet by mouth 2 (two) times daily.    Dispense:  20 tablet    Refill:  0    Order Specific Question:   Supervising Provider    Answer:   Mikey Kirschner [[id number]]  . predniSONE (DELTASONE) 20 MG tablet    Sig: 3 po qd x 3  d then 2 po qd x 3 d then 1 po qd x 2 d    Dispense:  17 tablet    Refill:  0    Order Specific Question:   Supervising Provider    Answer:   Mikey Kirschner [[id number]]  . methylPREDNISolone acetate (DEPO-MEDROL) injection 40 mg   OTC meds as directed for congestion.  Saline nasal spray followed by Bactroban ointment.  Call back in 72 hours if no improvement in symptoms, sooner if worse.  Based on the symptoms mentioned at a previous visit as well as a history of an elevated EBV panel, refer to rheumatology for further evaluation.  Patient agrees with this plan. 25 minutes was spent with the patient. Greater than half the time was spent in discussion and answering questions and counseling regarding the issues that the patient came in for today.

## 2017-08-27 ENCOUNTER — Encounter: Payer: Self-pay | Admitting: Nurse Practitioner

## 2017-08-27 MED ORDER — FLUCONAZOLE 150 MG PO TABS: ORAL_TABLET | ORAL | 1 refills | Status: DC

## 2017-08-31 MED FILL — DEXILANT DR 60 MG CAPSULE: 60 | 90 days supply | Qty: 90 | Fill #0

## 2017-09-03 ENCOUNTER — Other Ambulatory Visit: Payer: Self-pay | Admitting: Obstetrics and Gynecology

## 2017-09-03 DIAGNOSIS — N6489 Other specified disorders of breast: Secondary | ICD-10-CM

## 2017-09-10 ENCOUNTER — Telehealth: Payer: Self-pay

## 2017-09-10 ENCOUNTER — Telehealth: Payer: Self-pay | Admitting: Family Medicine

## 2017-09-10 ENCOUNTER — Encounter: Payer: Self-pay | Admitting: Nurse Practitioner

## 2017-09-10 ENCOUNTER — Other Ambulatory Visit: Payer: Self-pay | Admitting: Nurse Practitioner

## 2017-09-10 NOTE — Telephone Encounter (Signed)
Patient requesting refill for Xanax to [facility].  She is completely out.

## 2017-09-10 NOTE — Telephone Encounter (Signed)
Theresa [name] is requesting Alprazolam 1 mg One po Q 12 hours prn anxiety. # 60 w 2 refills.Please advise.

## 2017-09-11 ENCOUNTER — Other Ambulatory Visit: Payer: Self-pay | Admitting: Nurse Practitioner

## 2017-09-11 ENCOUNTER — Encounter: Payer: Self-pay | Admitting: Nurse Practitioner

## 2017-09-11 ENCOUNTER — Other Ambulatory Visit: Payer: Self-pay | Admitting: *Deleted

## 2017-09-11 MED ORDER — ALPRAZOLAM 1 MG PO TABS: 1.0000 mg | ORAL_TABLET | Freq: Two times a day (BID) | ORAL | 2 refills | Status: DC | PRN

## 2017-09-11 NOTE — Telephone Encounter (Signed)
Done

## 2017-09-11 NOTE — Telephone Encounter (Signed)
ok 

## 2017-09-11 NOTE — Telephone Encounter (Signed)
rx faxed to pharm

## 2017-09-17 ENCOUNTER — Encounter: Payer: Self-pay | Admitting: Nurse Practitioner

## 2017-09-25 DIAGNOSIS — R5383 Other fatigue: Secondary | ICD-10-CM | POA: Diagnosis not present

## 2017-09-25 DIAGNOSIS — M791 Myalgia, unspecified site: Secondary | ICD-10-CM | POA: Diagnosis not present

## 2017-09-25 DIAGNOSIS — M2550 Pain in unspecified joint: Secondary | ICD-10-CM | POA: Diagnosis not present

## 2017-09-25 DIAGNOSIS — R768 Other specified abnormal immunological findings in serum: Secondary | ICD-10-CM | POA: Diagnosis not present

## 2017-10-08 ENCOUNTER — Other Ambulatory Visit: Payer: Self-pay

## 2017-10-09 DIAGNOSIS — M791 Myalgia, unspecified site: Secondary | ICD-10-CM | POA: Diagnosis not present

## 2017-10-09 DIAGNOSIS — R5383 Other fatigue: Secondary | ICD-10-CM | POA: Diagnosis not present

## 2017-10-09 DIAGNOSIS — R768 Other specified abnormal immunological findings in serum: Secondary | ICD-10-CM | POA: Diagnosis not present

## 2017-10-09 DIAGNOSIS — M2550 Pain in unspecified joint: Secondary | ICD-10-CM | POA: Diagnosis not present

## 2017-10-16 DIAGNOSIS — Z01419 Encounter for gynecological examination (general) (routine) without abnormal findings: Secondary | ICD-10-CM | POA: Diagnosis not present

## 2017-10-16 DIAGNOSIS — Z6833 Body mass index (BMI) 33.0-33.9, adult: Secondary | ICD-10-CM | POA: Diagnosis not present

## 2017-10-17 ENCOUNTER — Other Ambulatory Visit: Payer: Self-pay | Admitting: Obstetrics and Gynecology

## 2017-10-17 DIAGNOSIS — N6310 Unspecified lump in the right breast, unspecified quadrant: Secondary | ICD-10-CM

## 2017-10-22 ENCOUNTER — Encounter (INDEPENDENT_AMBULATORY_CARE_PROVIDER_SITE_OTHER): Payer: Self-pay | Admitting: Internal Medicine

## 2017-10-22 ENCOUNTER — Other Ambulatory Visit: Payer: Self-pay

## 2017-10-24 ENCOUNTER — Ambulatory Visit
Admission: RE | Admit: 2017-10-24 | Discharge: 2017-10-24 | Disposition: A | Discharge status: Home / Self Care | Payer: 59 | Source: Ambulatory Visit | Attending: Obstetrics and Gynecology | Admitting: Obstetrics and Gynecology

## 2017-10-24 DIAGNOSIS — R928 Other abnormal and inconclusive findings on diagnostic imaging of breast: Secondary | ICD-10-CM | POA: Diagnosis not present

## 2017-10-24 DIAGNOSIS — N6310 Unspecified lump in the right breast, unspecified quadrant: Secondary | ICD-10-CM | POA: Diagnosis not present

## 2017-10-27 ENCOUNTER — Encounter: Payer: Self-pay | Admitting: Nurse Practitioner

## 2017-10-29 ENCOUNTER — Encounter: Payer: Self-pay | Admitting: Nurse Practitioner

## 2017-10-29 ENCOUNTER — Other Ambulatory Visit: Payer: Self-pay | Admitting: Nurse Practitioner

## 2017-10-29 DIAGNOSIS — Z79899 Other long term (current) drug therapy: Secondary | ICD-10-CM

## 2017-10-29 MED ORDER — TERBINAFINE HCL 250 MG PO TABS: 250.0000 mg | ORAL_TABLET | Freq: Every day | ORAL | 2 refills | Status: DC

## 2017-11-01 ENCOUNTER — Telehealth: Payer: Self-pay

## 2017-11-01 NOTE — Telephone Encounter (Signed)
[place] is requesting Zolpidem Er 12.5 one po QHS prn sleep # 30.

## 2017-11-02 ENCOUNTER — Other Ambulatory Visit: Payer: Self-pay | Admitting: Nurse Practitioner

## 2017-11-02 MED ORDER — ZOLPIDEM TARTRATE ER 12.5 MG PO TBCR: 12.5000 mg | EXTENDED_RELEASE_TABLET | Freq: Every evening | ORAL | 0 refills | Status: DC | PRN

## 2017-11-02 NOTE — Telephone Encounter (Signed)
Done

## 2017-11-12 ENCOUNTER — Encounter: Payer: Self-pay | Admitting: Nurse Practitioner

## 2017-11-16 ENCOUNTER — Other Ambulatory Visit: Payer: Self-pay | Admitting: Nurse Practitioner

## 2017-11-16 ENCOUNTER — Ambulatory Visit: Payer: 59 | Admitting: Nurse Practitioner

## 2017-11-16 ENCOUNTER — Telehealth: Payer: Self-pay | Admitting: Nurse Practitioner

## 2017-11-16 VITALS — BP 148/80 | Temp 98.4°F | Ht 66.0 in | Wt 201.0 lb

## 2017-11-16 DIAGNOSIS — R5382 Chronic fatigue, unspecified: Secondary | ICD-10-CM | POA: Diagnosis not present

## 2017-11-16 DIAGNOSIS — Z79899 Other long term (current) drug therapy: Secondary | ICD-10-CM | POA: Diagnosis not present

## 2017-11-16 DIAGNOSIS — R5383 Other fatigue: Secondary | ICD-10-CM | POA: Diagnosis not present

## 2017-11-16 DIAGNOSIS — R1312 Dysphagia, oropharyngeal phase: Secondary | ICD-10-CM | POA: Diagnosis not present

## 2017-11-16 NOTE — Telephone Encounter (Signed)
Hoyle Sauer is putting a referral in for Dr. Deeann Saint office for Theresa [name].  Clora requested that she be scheduled for the [place] office.

## 2017-11-17 ENCOUNTER — Encounter: Payer: Self-pay | Admitting: Nurse Practitioner

## 2017-11-17 LAB — CBC WITH DIFFERENTIAL/PLATELET
Basophils Absolute: 0 10*3/uL (ref 0.0–0.2)
Basos: 0 %
EOS (ABSOLUTE): 0.3 10*3/uL (ref 0.0–0.4)
Eos: 4 %
HEMOGLOBIN: 13.2 g/dL (ref 11.1–15.9)
Hematocrit: 39.1 % (ref 34.0–46.6)
IMMATURE GRANS (ABS): 0 10*3/uL (ref 0.0–0.1)
Immature Granulocytes: 0 %
LYMPHS: 23 %
Lymphocytes Absolute: 1.8 10*3/uL (ref 0.7–3.1)
MCH: 29.7 pg (ref 26.6–33.0)
MCHC: 33.8 g/dL (ref 31.5–35.7)
MCV: 88 fL (ref 79–97)
MONOCYTES: 5 %
Monocytes Absolute: 0.4 10*3/uL (ref 0.1–0.9)
NEUTROS PCT: 68 %
Neutrophils Absolute: 5.4 10*3/uL (ref 1.4–7.0)
PLATELETS: 330 10*3/uL (ref 150–379)
RBC: 4.44 x10E6/uL (ref 3.77–5.28)
RDW: 13.6 % (ref 12.3–15.4)
WBC: 7.9 10*3/uL (ref 3.4–10.8)

## 2017-11-17 LAB — HEPATIC FUNCTION PANEL
ALBUMIN: 4.5 g/dL (ref 3.5–5.5)
ALK PHOS: 93 IU/L (ref 39–117)
ALT: 16 IU/L (ref 0–32)
AST: 16 IU/L (ref 0–40)
BILIRUBIN, DIRECT: 0.07 mg/dL (ref 0.00–0.40)
Bilirubin Total: <0.2 mg/dL (ref 0.0–1.2)
Total Protein: 7.3 g/dL (ref 6.0–8.5)

## 2017-11-17 LAB — SEDIMENTATION RATE: SED RATE: 14 mm/h (ref 0–32)

## 2017-11-17 NOTE — Progress Notes (Signed)
Subjective: Presents for recheck.  Complaints of extreme fatigue, states she feels like she has the flu all the time.  Just completed a workup with rheumatology, her ANA was elevated but otherwise labs were normal.  Was finally diagnosed with fatigue and chronic fatigue syndrome.  Patient wonders what role EBV, HPV and herpes virus may have on her immune system.  In addition she has what she describes as "a rock" feeling behind her right tonsil.  Pills and ice cubes tend to get stuck there.  Can swallow fluids but is acidic fluids tend to burn the area.  This began about a year ago but worse lately.  No tobacco use.  Did have exposure to secondhand smoke growing up.  Reflux stable on Dexilant.  No fevers.  Her specialist changed her work hours but patient is struggling due to sleep issues.  Her relationship with her husband is fine.  No change in her stress level, seems to be dealing with it better at this point.  Objective:   BP (!) 148/80   Temp 98.4 F (36.9 C) (Oral)   Ht '5\' 6"'  (1.676 m)   Wt 201 lb (91.2 kg)   BMI 32.44 kg/m  NAD.  Alert, oriented.  TMs mild clear effusion, no erythema.  Pharynx, nonerythematous, no lesions noted.  Tonsils less than 1+ in size, no exudate.  Neck supple with mild soft adenopathy.  Thyroid nontender, no mass or goiter noted.  Lungs clear.  Heart regular rate and rhythm.  Reviewed lab work on patient's phone from rheumatology.  Assessment:   Problem List Items Addressed This Visit      Other   Fatigue - Primary   Relevant Orders   CBC with Differential (Completed)   Hepatic function panel (Completed)   Sed Rate (ESR) (Completed)    Other Visit Diagnoses    Oropharyngeal dysphagia       Relevant Orders   Ambulatory referral to ENT   High risk medication use       Relevant Orders   Hepatic function panel (Completed)       Plan: Lab work pending.  Recommend follow-up with rheumatologist.  Has an appointment with Dr. Laural Golden on 5/28.  Further follow-up  based on lab results, call back sooner if needed. 25 minutes was spent with the patient.  This statement verifies that 25 minutes was indeed spent with the patient. Greater than half the time was spent in discussion, counseling and answering questions  regarding the issues that the patient came in for today as reflected in the diagnosis (s) please refer to documentation for further details.

## 2017-11-19 ENCOUNTER — Encounter: Payer: Self-pay | Admitting: Nurse Practitioner

## 2017-11-19 MED FILL — CITALOPRAM HBR 40 MG TABLET: 40 | 90 days supply | Qty: 90 | Fill #0

## 2017-11-20 ENCOUNTER — Other Ambulatory Visit: Payer: Self-pay | Admitting: Nurse Practitioner

## 2017-11-20 ENCOUNTER — Encounter: Payer: Self-pay | Admitting: Nurse Practitioner

## 2017-11-20 MED FILL — buPROPion HCL ER (XL) 150 M: 150 | 90 days supply | Qty: 90 | Fill #0

## 2017-11-21 ENCOUNTER — Telehealth: Payer: Self-pay | Admitting: Family Medicine

## 2017-11-21 NOTE — Telephone Encounter (Signed)
Patient had matrix to fax over FMLA for her being out of work only one day but when she came in,stating intermittent for chronic fatigue. From my research she has not had one before and didn't have enough information in notes to fill in can you help with this.I wasn't sure what to fill in.From her last vist she is seeing Dr. Laural Golden 5/28

## 2017-11-21 NOTE — Telephone Encounter (Signed)
This message is for Theresa [name] in her box.

## 2017-11-22 ENCOUNTER — Other Ambulatory Visit: Payer: Self-pay | Admitting: Nurse Practitioner

## 2017-11-22 ENCOUNTER — Encounter (INDEPENDENT_AMBULATORY_CARE_PROVIDER_SITE_OTHER): Payer: Self-pay

## 2017-11-22 MED ORDER — BUPROPION HCL ER (XL) 150 MG PO TB24: 150.0000 mg | ORAL_TABLET | Freq: Every day | ORAL | 1 refills | Status: DC

## 2017-11-26 ENCOUNTER — Encounter: Payer: Self-pay | Admitting: Nurse Practitioner

## 2017-11-27 ENCOUNTER — Encounter: Payer: Self-pay | Admitting: Nurse Practitioner

## 2017-11-28 ENCOUNTER — Encounter (INDEPENDENT_AMBULATORY_CARE_PROVIDER_SITE_OTHER): Payer: Self-pay | Admitting: Internal Medicine

## 2017-11-28 ENCOUNTER — Encounter: Payer: Self-pay | Admitting: Nurse Practitioner

## 2017-11-29 ENCOUNTER — Encounter: Payer: Self-pay | Admitting: Nurse Practitioner

## 2017-11-29 ENCOUNTER — Other Ambulatory Visit: Payer: Self-pay | Admitting: Nurse Practitioner

## 2017-11-29 MED ORDER — ALPRAZOLAM 1 MG PO TABS: ORAL_TABLET | ORAL | 2 refills | Status: DC

## 2017-12-04 ENCOUNTER — Encounter: Payer: Self-pay | Admitting: Nurse Practitioner

## 2017-12-04 ENCOUNTER — Encounter (INDEPENDENT_AMBULATORY_CARE_PROVIDER_SITE_OTHER): Payer: Self-pay | Admitting: Internal Medicine

## 2017-12-04 NOTE — Telephone Encounter (Signed)
Got most of it done. Waiting on my chart message from patient so I can fill in those blanks. Hopefully will get to you this afternoon if she answers message. Thanks.

## 2017-12-05 NOTE — Telephone Encounter (Signed)
Done. Given to [place].

## 2017-12-12 ENCOUNTER — Encounter: Payer: Self-pay | Admitting: Nurse Practitioner

## 2017-12-17 ENCOUNTER — Ambulatory Visit (INDEPENDENT_AMBULATORY_CARE_PROVIDER_SITE_OTHER): Payer: 59 | Admitting: Otolaryngology

## 2017-12-17 ENCOUNTER — Ambulatory Visit (INDEPENDENT_AMBULATORY_CARE_PROVIDER_SITE_OTHER): Payer: 59 | Admitting: Internal Medicine

## 2017-12-17 ENCOUNTER — Encounter (INDEPENDENT_AMBULATORY_CARE_PROVIDER_SITE_OTHER): Payer: Self-pay | Admitting: Internal Medicine

## 2017-12-17 VITALS — BP 120/82 | HR 72 | Temp 98.4°F | Ht 66.0 in | Wt 206.9 lb

## 2017-12-17 DIAGNOSIS — K219 Gastro-esophageal reflux disease without esophagitis: Secondary | ICD-10-CM

## 2017-12-17 DIAGNOSIS — K5909 Other constipation: Secondary | ICD-10-CM | POA: Diagnosis not present

## 2017-12-17 DIAGNOSIS — Z860100 Personal history of colon polyps, unspecified: Secondary | ICD-10-CM

## 2017-12-17 DIAGNOSIS — R070 Pain in throat: Secondary | ICD-10-CM

## 2017-12-17 DIAGNOSIS — J351 Hypertrophy of tonsils: Secondary | ICD-10-CM | POA: Diagnosis not present

## 2017-12-17 NOTE — Patient Instructions (Signed)
She can continue the Trulance as needed. ( She has Trulance at home).

## 2017-12-17 NOTE — Progress Notes (Signed)
   Subjective:    Patient ID: Theresa [name], female    DOB: 1970-09-01, 47 y.o.   MRN: [id number]  HPI Here today for f/u. Last seen in May of 2018. Hx of chronic constipation. Seen In May of 2018 for diarrhea and lower abdominal cramping. Stool O and P were negative and so was fecal lactoferrin. She was empirically treated with Flagyl.  She is still having constipation off and on . She now alternates between constipation/ diarrhea. She is having a BM one a day and she may go a week.  When she is constipated, she does not take anything. She has tried Linzess and Amitiza which did not help. Her appetite is good. She has Gained about 6 pounds since her last visit.  Her last colonoscopy was in 2014. Biopsy: Tubular adenoma.         Objective:   Physical Exam Blood pressure 120/82, pulse 72, temperature 98.4 F (36.9 C), height 5\' 6"  (1.676 m), weight 206 lb 14.4 oz (93.8 kg). Alert and oriented. Skin warm and dry. Oral mucosa is moist.   . Sclera anicteric, conjunctivae is pink. Thyroid not enlarged. No cervical lymphadenopathy. Lungs clear. Heart regular rate and rhythm.  Abdomen is soft. Bowel sounds are positive. No hepatomegaly. No abdominal masses felt. No tenderness.  No edema to lower extremities.          Assessment & Plan:  Constipation. Continue the fiber  GERD: Continue the Dexilant. Tubular adenoma: Will put on a recall for December 2019.

## 2017-12-27 ENCOUNTER — Encounter: Payer: Self-pay | Admitting: Nurse Practitioner

## 2017-12-27 ENCOUNTER — Encounter (INDEPENDENT_AMBULATORY_CARE_PROVIDER_SITE_OTHER): Payer: Self-pay | Admitting: Internal Medicine

## 2017-12-28 ENCOUNTER — Other Ambulatory Visit: Payer: Self-pay | Admitting: Nurse Practitioner

## 2017-12-28 DIAGNOSIS — F329 Major depressive disorder, single episode, unspecified: Secondary | ICD-10-CM

## 2017-12-28 DIAGNOSIS — F419 Anxiety disorder, unspecified: Principal | ICD-10-CM

## 2018-01-02 ENCOUNTER — Encounter (INDEPENDENT_AMBULATORY_CARE_PROVIDER_SITE_OTHER): Payer: Self-pay

## 2018-01-04 ENCOUNTER — Other Ambulatory Visit (INDEPENDENT_AMBULATORY_CARE_PROVIDER_SITE_OTHER): Payer: Self-pay | Admitting: Internal Medicine

## 2018-01-04 MED ORDER — RABEPRAZOLE SODIUM 20 MG PO TBEC: 20.0000 mg | DELAYED_RELEASE_TABLET | Freq: Two times a day (BID) | ORAL | 5 refills | Status: DC

## 2018-01-04 NOTE — Telephone Encounter (Signed)
Time he states dexlansoprazole is not working anymore.  She saw Dr. Benjamine Mola who added Zantac at bedtime.  She is also taking Dexilant in the evening.  She has been watching her diet closely.  She does not drink colas anymore. Will switch her to Rebaprazole 20 mg p.o. twice daily. She will call with progress report in 2 to 3 weeks.

## 2018-01-08 ENCOUNTER — Encounter (INDEPENDENT_AMBULATORY_CARE_PROVIDER_SITE_OTHER): Payer: Self-pay | Admitting: Internal Medicine

## 2018-01-08 ENCOUNTER — Ambulatory Visit (INDEPENDENT_AMBULATORY_CARE_PROVIDER_SITE_OTHER): Payer: Self-pay | Admitting: Internal Medicine

## 2018-01-08 ENCOUNTER — Other Ambulatory Visit (INDEPENDENT_AMBULATORY_CARE_PROVIDER_SITE_OTHER): Payer: Self-pay | Admitting: *Deleted

## 2018-01-08 ENCOUNTER — Encounter: Payer: Self-pay | Admitting: Nurse Practitioner

## 2018-01-08 MED ORDER — PANTOPRAZOLE SODIUM 40 MG PO TBEC: 40.0000 mg | DELAYED_RELEASE_TABLET | Freq: Two times a day (BID) | ORAL | 3 refills | Status: DC

## 2018-01-08 MED ORDER — OMEPRAZOLE-SODIUM BICARBONATE 40-1100 MG PO CAPS: 1.0000 | ORAL_CAPSULE | Freq: Every day | ORAL | 3 refills | Status: DC

## 2018-01-08 MED FILL — PANTOPRAZOLE SOD DR 40 MG T: 40 | 90 days supply | Qty: 180 | Fill #0

## 2018-01-15 MED FILL — raNITIdine HCL 150 MG TABS: 150 | 30 days supply | Qty: 30 | Fill #0

## 2018-01-22 ENCOUNTER — Telehealth (INDEPENDENT_AMBULATORY_CARE_PROVIDER_SITE_OTHER): Payer: Self-pay | Admitting: Internal Medicine

## 2018-01-22 NOTE — Telephone Encounter (Signed)
Samples to front desk 

## 2018-01-22 NOTE — Telephone Encounter (Signed)
Patient wanted more dexalon

## 2018-01-28 ENCOUNTER — Encounter: Payer: Self-pay | Admitting: Nurse Practitioner

## 2018-01-28 ENCOUNTER — Telehealth: Payer: Self-pay | Admitting: Nurse Practitioner

## 2018-01-28 ENCOUNTER — Ambulatory Visit: Payer: 59 | Admitting: Nurse Practitioner

## 2018-01-28 VITALS — BP 102/74 | Temp 98.6°F | Ht 66.0 in | Wt 206.4 lb

## 2018-01-28 DIAGNOSIS — A6920 Lyme disease, unspecified: Secondary | ICD-10-CM | POA: Diagnosis not present

## 2018-01-28 DIAGNOSIS — S70262A Insect bite (nonvenomous), left hip, initial encounter: Secondary | ICD-10-CM

## 2018-01-28 DIAGNOSIS — R5383 Other fatigue: Secondary | ICD-10-CM

## 2018-01-28 DIAGNOSIS — W57XXXA Bitten or stung by nonvenomous insect and other nonvenomous arthropods, initial encounter: Secondary | ICD-10-CM

## 2018-01-28 MED ORDER — DOXYCYCLINE HYCLATE 100 MG PO TABS: 100.0000 mg | ORAL_TABLET | Freq: Two times a day (BID) | ORAL | 0 refills | Status: DC

## 2018-01-28 NOTE — Telephone Encounter (Signed)
Patient is aware and will be here at 1:40 this evening.

## 2018-01-28 NOTE — Patient Instructions (Addendum)
[facility]    Lyme Disease Lyme disease is an infection that affects many parts of the body, including the skin, joints, and nervous system. It is a bacterial infection that starts from the bite of an infected tick. The infection can spread, and some of the symptoms are similar to the flu. If Lyme disease is not treated, it may cause joint pain, swelling, numbness, problems thinking, fatigue, muscle weakness, and other problems. What are the causes? This condition is caused by bacteria called Borrelia burgdorferi. You can get Lyme disease by being bitten by an infected tick. The tick must be attached to your skin to pass along the infection. Deer often carry infected ticks. What increases the risk? The following factors may make you more likely to develop this condition:  Living in or visiting these areas in the [place] ? [place]. ? [place]. ? [place].  Spending time in wooded or grassy areas.  Being outdoors with exposed skin.  Camping, gardening, hiking, fishing, or hunting outdoors.  Failing to remove a tick from your skin within 3-4 days.  What are the signs or symptoms? Symptoms of this condition include:  A round, red rash that surrounds the center of the tick bite. This is the first sign of infection. The center of the rash may be blood colored or have tiny blisters.  Fatigue.  Headache.  Chills and fever.  General achiness.  Joint pain, often in the knees.  Muscle pain.  Swollen lymph glands.  Stiff neck.  How is this diagnosed? This condition is diagnosed based on:  Your symptoms and medical history.  A physical exam.  A blood test.  How is this treated? The main treatment for this condition is antibiotic medicine, which is usually taken by mouth (orally). The length of treatment depends on how soon after a tick bite you begin taking the medicine. In some cases, treatment is necessary for several weeks. If the infection is  severe, antibiotics may need to be given through an IV tube that is inserted into one of your veins. Follow these instructions at home:  Take your antibiotic medicine as told by your health care provider. Do not stop taking the antibiotic even if you start to feel better.  Ask your health care provider about takinga probiotic in between doses of your antibiotic to help avoid stomach upset or diarrhea.  Check with your health care provider before supplementing your treatment. Many alternative therapies have not been proven and may be harmful to you.  Keep all follow-up visits as told by your health care provider. This is important. How is this prevented? You can become reinfected if you get another tick bite from an infected tick. Take these steps to help prevent an infection:  Cover your skin with light-colored clothing when you are outdoors in the spring and summer months.  Spray clothing and skin with bug spray. The spray should be 20-30% DEET.  Avoid wooded, grassy, and shaded areas.  Remove yard litter, brush, trash, and plants that attract deer and rodents.  Check yourself for ticks when you come indoors.  Wash clothing worn each day.  Check your pets for ticks before they come inside.  If you find a tick: ? Remove it with tweezers. ? Clean your hands and the bite area with rubbing alcohol or soap and water.  Pregnant women should take special care to avoid tick bites because the infection can be passed along to the fetus. Contact a health  care provider if:  You have symptoms after treatment.  You have removed a tick and want to bring it to your health care provider for testing. Get help right away if:  You have an irregular heartbeat.  You have nerve pain.  Your face feels numb. This information is not intended to replace advice given to you by your health care provider. Make sure you discuss any questions you have with your health care provider. Document Released:  11/06/2000 Document Revised: 03/21/2016 Document Reviewed: 03/21/2016 Elsevier Interactive Patient Education  2018 [employer].

## 2018-01-28 NOTE — Telephone Encounter (Signed)
FYI

## 2018-01-28 NOTE — Telephone Encounter (Signed)
FYI, patient sent an email this morning with photo of tick bite with bullseye.  She went ahead and scheduled an appt with Hoyle Sauer this afternoon at 1:40 because we were getting booked.  But she said she could cancel the appt if Hoyle Sauer had rather just do labwork for lyme disease?  Said she wasn't feeling good today so wasn't going to stay at work all day either way.

## 2018-01-28 NOTE — Telephone Encounter (Signed)
The picture is good but recommend the office visit this afternoon

## 2018-01-29 ENCOUNTER — Encounter: Payer: Self-pay | Admitting: Nurse Practitioner

## 2018-01-29 ENCOUNTER — Other Ambulatory Visit: Payer: Self-pay | Admitting: Nurse Practitioner

## 2018-01-29 ENCOUNTER — Telehealth: Payer: Self-pay | Admitting: Nurse Practitioner

## 2018-01-29 DIAGNOSIS — S70262A Insect bite (nonvenomous), left hip, initial encounter: Secondary | ICD-10-CM

## 2018-01-29 DIAGNOSIS — R5383 Other fatigue: Secondary | ICD-10-CM

## 2018-01-29 DIAGNOSIS — W57XXXA Bitten or stung by nonvenomous insect and other nonvenomous arthropods, initial encounter: Secondary | ICD-10-CM

## 2018-01-29 NOTE — Progress Notes (Signed)
Subjective:  Presents for c/o fatigue and joint pain. Had a tick bite on her left hip area about 2 weeks ago. Mildly pruritic at first, seemed to get better. Now has progressively worsened with redness and warmth. No documented fever. Headache. No other rash.   Objective:   BP 102/74   Temp 98.6 F (37 C) (Oral)   Ht 5\' 6"  (1.676 m)   Wt 206 lb 6.4 oz (93.6 kg)   BMI 33.31 kg/m  NAD. Alert, oriented. Lungs clear. Heart RRR. Circular erythematous warm lesion, approx 4 cm noted on the left hip area with what appears to be early central clearing. In the center is a tiny open healed area from the tick bite.   Assessment:   Problem List Items Addressed This Visit      Other   Fatigue - Primary    Other Visit Diagnoses    Joint pain due to Lyme disease       Relevant Medications   doxycycline (VIBRA-TABS) 100 MG tablet   Tick bite of hip, left, initial encounter           Plan:   Meds ordered this encounter  Medications  . doxycycline (VIBRA-TABS) 100 MG tablet    Sig: Take 1 tablet (100 mg total) by mouth 2 (two) times daily.    Dispense:  42 tablet    Refill:  0    Order Specific Question:   Supervising Provider    Answer:   Maggie Font   Start antibiotic to cover tick disease/Lyme disease. Warning signs reviewed. Call back by end of the week if no improvement, sooner if worse. Given written information on tick disease.

## 2018-01-29 NOTE — Telephone Encounter (Signed)
Please order Lyme disease titer. Also, check to make sure this is the basic test not the one with detailed antibodies, thanks. Please see diagnosis from last visit on 6/18.

## 2018-01-29 NOTE — Telephone Encounter (Signed)
Lab placed in Epic and pt notified

## 2018-01-31 DIAGNOSIS — R5383 Other fatigue: Secondary | ICD-10-CM | POA: Diagnosis not present

## 2018-01-31 DIAGNOSIS — S70262A Insect bite (nonvenomous), left hip, initial encounter: Secondary | ICD-10-CM | POA: Diagnosis not present

## 2018-01-31 DIAGNOSIS — W57XXXA Bitten or stung by nonvenomous insect and other nonvenomous arthropods, initial encounter: Secondary | ICD-10-CM | POA: Diagnosis not present

## 2018-02-02 LAB — B. BURGDORFI ANTIBODIES

## 2018-02-04 ENCOUNTER — Encounter: Payer: Self-pay | Admitting: Nurse Practitioner

## 2018-02-06 MED FILL — CITALOPRAM HBR 40 MG TABLET: 40 | 90 days supply | Qty: 90 | Fill #1

## 2018-02-08 MED FILL — raNITIdine HCL 150 MG TABS: 150 | 30 days supply | Qty: 30 | Fill #1

## 2018-02-18 MED FILL — buPROPion HCL ER (XL) 150 M: 150 | 90 days supply | Qty: 90 | Fill #1

## 2018-02-21 ENCOUNTER — Encounter: Payer: Self-pay | Admitting: Nurse Practitioner

## 2018-02-25 ENCOUNTER — Encounter: Payer: Self-pay | Admitting: Family Medicine

## 2018-02-25 ENCOUNTER — Ambulatory Visit: Payer: 59 | Admitting: Family Medicine

## 2018-02-25 VITALS — BP 126/80 | Temp 97.9°F | Ht 66.0 in | Wt 207.2 lb

## 2018-02-25 DIAGNOSIS — J329 Chronic sinusitis, unspecified: Secondary | ICD-10-CM | POA: Diagnosis not present

## 2018-02-25 MED ORDER — CEFDINIR 300 MG PO CAPS: 300.0000 mg | ORAL_CAPSULE | Freq: Two times a day (BID) | ORAL | 0 refills | Status: DC

## 2018-02-25 NOTE — Progress Notes (Signed)
   Subjective:    Patient ID: Theresa [name], female    DOB: 04-03-1971, 47 y.o.   MRN: [id number]  Cough  This is a new problem. The current episode started in the past 7 days. Associated symptoms include headaches, nasal congestion and a sore throat. Treatments tried: advil and tylenol.   Family went on vacation  [place] out to eat with someone who had strep   Started with ausea and vom and diarrhea  Then got a bit better  [place] , may have got dehyderated     Now more resp features, had very bad cough, used nyquil with vicks   Pt on doxu fr a tick bite       Review of Systems  HENT: Positive for sore throat.   Respiratory: Positive for cough.   Neurological: Positive for headaches.       Objective:   Physical Exam  Alert, mild malaise. Hydration good Vitals stable. frontal/ maxillary tenderness evident positive nasal congestion. pharynx normal neck supple  lungs clear/no crackles or wheezes. heart regular in rhythm       Assessment & Plan:  Impression rhinosinusitis likely post viral, discussed with patient. plan antibiotics prescribed. Questions answered. Symptomatic care discussed. warning signs discussed. WSL

## 2018-02-27 ENCOUNTER — Encounter: Payer: Self-pay | Admitting: Family Medicine

## 2018-03-01 ENCOUNTER — Other Ambulatory Visit: Payer: Self-pay | Admitting: Nurse Practitioner

## 2018-03-01 ENCOUNTER — Encounter: Payer: Self-pay | Admitting: Nurse Practitioner

## 2018-03-12 ENCOUNTER — Other Ambulatory Visit: Payer: Self-pay | Admitting: Nurse Practitioner

## 2018-03-13 NOTE — Telephone Encounter (Signed)
1 refill 90

## 2018-03-14 ENCOUNTER — Other Ambulatory Visit (INDEPENDENT_AMBULATORY_CARE_PROVIDER_SITE_OTHER): Payer: Self-pay | Admitting: Internal Medicine

## 2018-03-14 ENCOUNTER — Ambulatory Visit (HOSPITAL_COMMUNITY)
Admission: RE | Admit: 2018-03-14 | Discharge: 2018-03-14 | Disposition: A | Discharge status: Home / Self Care | Payer: 59 | Source: Ambulatory Visit | Attending: Internal Medicine | Admitting: Internal Medicine

## 2018-03-14 ENCOUNTER — Other Ambulatory Visit (INDEPENDENT_AMBULATORY_CARE_PROVIDER_SITE_OTHER): Payer: Self-pay | Admitting: *Deleted

## 2018-03-14 DIAGNOSIS — R109 Unspecified abdominal pain: Secondary | ICD-10-CM | POA: Diagnosis not present

## 2018-03-14 DIAGNOSIS — I700 Atherosclerosis of aorta: Secondary | ICD-10-CM | POA: Diagnosis not present

## 2018-03-14 DIAGNOSIS — R1084 Generalized abdominal pain: Secondary | ICD-10-CM | POA: Insufficient documentation

## 2018-03-14 MED ORDER — IOPAMIDOL (ISOVUE-300) INJECTION 61%
100.0000 mL | Freq: Once | INTRAVENOUS | Status: AC | PRN
  Administered 2018-03-14: 100 mL via INTRAVENOUS

## 2018-03-14 MED ORDER — DICYCLOMINE HCL 10 MG PO CAPS: 10.0000 mg | ORAL_CAPSULE | Freq: Three times a day (TID) | ORAL | 1 refills | Status: DC | PRN

## 2018-03-20 MED FILL — raNITIdine HCL 150 MG TABS: 150 | 90 days supply | Qty: 90 | Fill #2

## 2018-04-01 DIAGNOSIS — Z30433 Encounter for removal and reinsertion of intrauterine contraceptive device: Secondary | ICD-10-CM | POA: Diagnosis not present

## 2018-04-14 ENCOUNTER — Other Ambulatory Visit: Payer: Self-pay | Admitting: Family Medicine

## 2018-04-16 NOTE — Telephone Encounter (Signed)
Ok plus 1 monthly ref

## 2018-04-29 DIAGNOSIS — H04123 Dry eye syndrome of bilateral lacrimal glands: Secondary | ICD-10-CM | POA: Diagnosis not present

## 2018-05-02 ENCOUNTER — Other Ambulatory Visit: Payer: Self-pay | Admitting: Physician Assistant

## 2018-05-02 ENCOUNTER — Telehealth: Payer: 59 | Admitting: Physician Assistant

## 2018-05-02 DIAGNOSIS — J019 Acute sinusitis, unspecified: Principal | ICD-10-CM

## 2018-05-02 DIAGNOSIS — B9789 Other viral agents as the cause of diseases classified elsewhere: Secondary | ICD-10-CM

## 2018-05-02 MED ORDER — FLUTICASONE PROPIONATE 50 MCG/ACT NA SUSP: 2.0000 | Freq: Every day | NASAL | 0 refills | Status: DC

## 2018-05-02 MED ORDER — AZELASTINE HCL 0.1 % NA SOLN: 2.0000 | Freq: Two times a day (BID) | NASAL | 1 refills | Status: DC

## 2018-05-02 NOTE — Progress Notes (Signed)

## 2018-05-15 ENCOUNTER — Telehealth (INDEPENDENT_AMBULATORY_CARE_PROVIDER_SITE_OTHER): Payer: Self-pay | Admitting: Internal Medicine

## 2018-05-15 NOTE — Telephone Encounter (Signed)
Patient called the office wanting dexilant -  We did not have any - she would like Dr Laural Golden to send prescription for prevacid

## 2018-05-16 ENCOUNTER — Other Ambulatory Visit (INDEPENDENT_AMBULATORY_CARE_PROVIDER_SITE_OTHER): Payer: Self-pay | Admitting: *Deleted

## 2018-05-16 DIAGNOSIS — K219 Gastro-esophageal reflux disease without esophagitis: Secondary | ICD-10-CM

## 2018-05-16 MED ORDER — LANSOPRAZOLE 30 MG PO CPDR: 30.0000 mg | DELAYED_RELEASE_CAPSULE | Freq: Every day | ORAL | 3 refills | Status: DC

## 2018-05-16 MED FILL — LANSOPRAZOLE DR 30 MG CAP: 30 | 90 days supply | Qty: 90 | Fill #0

## 2018-05-16 NOTE — Telephone Encounter (Signed)
A RX for the Prevacid 30 mg - take 1 by mouth daily. #90 with 3 refills has been escribed to [facility].

## 2018-05-24 MED FILL — XIIDRA 5% EYE DROPS: 5 | 30 days supply | Qty: 60 | Fill #0

## 2018-05-30 ENCOUNTER — Other Ambulatory Visit (INDEPENDENT_AMBULATORY_CARE_PROVIDER_SITE_OTHER): Payer: Self-pay | Admitting: Internal Medicine

## 2018-05-31 MED FILL — DEXILANT DR 60 MG CAPSULE: 60 | 30 days supply | Qty: 30 | Fill #0

## 2018-06-05 ENCOUNTER — Encounter: Payer: Self-pay | Admitting: Internal Medicine

## 2018-06-10 MED FILL — raNITIdine HCL 150 MG TABS: 150 | 90 days supply | Qty: 90 | Fill #3

## 2018-06-13 ENCOUNTER — Telehealth: Payer: Self-pay | Admitting: Family Medicine

## 2018-06-13 MED ORDER — CITALOPRAM HYDROBROMIDE 40 MG PO TABS: 40.0000 mg | ORAL_TABLET | Freq: Every day | ORAL | 0 refills | Status: DC

## 2018-06-13 MED FILL — CITALOPRAM HBR 40 MG TABLET: 40 | 90 days supply | Qty: 90 | Fill #0

## 2018-06-13 NOTE — Telephone Encounter (Signed)
Ok thre e mo worth 

## 2018-06-13 NOTE — Telephone Encounter (Signed)
Pharmacy requesting refill on Citalopram 40 mg tablet. Take one tablet by mouth daily.

## 2018-06-13 NOTE — Addendum Note (Signed)
Addended by: Dairl Ponder on: 06/13/2018 10:40 AM   Modules accepted: Orders

## 2018-06-14 DIAGNOSIS — H16223 Keratoconjunctivitis sicca, not specified as Sjogren's, bilateral: Secondary | ICD-10-CM | POA: Diagnosis not present

## 2018-06-29 ENCOUNTER — Other Ambulatory Visit: Payer: Self-pay | Admitting: Family Medicine

## 2018-07-02 ENCOUNTER — Encounter: Payer: Self-pay | Admitting: Family Medicine

## 2018-07-02 MED ORDER — ALPRAZOLAM 1 MG PO TABS: ORAL_TABLET | ORAL | 2 refills | Status: DC

## 2018-07-02 NOTE — Telephone Encounter (Signed)
Faxed to [address]

## 2018-07-05 ENCOUNTER — Encounter: Payer: Self-pay | Admitting: Family Medicine

## 2018-07-05 ENCOUNTER — Other Ambulatory Visit: Payer: Self-pay

## 2018-07-05 MED ORDER — ALPRAZOLAM 1 MG PO TABS: ORAL_TABLET | ORAL | 2 refills | Status: DC

## 2018-07-05 NOTE — Telephone Encounter (Signed)
Script printed for #90 with 2 refills; awaiting signature.

## 2018-07-17 ENCOUNTER — Other Ambulatory Visit (INDEPENDENT_AMBULATORY_CARE_PROVIDER_SITE_OTHER): Payer: Self-pay | Admitting: *Deleted

## 2018-07-17 ENCOUNTER — Telehealth (INDEPENDENT_AMBULATORY_CARE_PROVIDER_SITE_OTHER): Payer: Self-pay | Admitting: *Deleted

## 2018-07-17 ENCOUNTER — Encounter (INDEPENDENT_AMBULATORY_CARE_PROVIDER_SITE_OTHER): Payer: Self-pay

## 2018-07-17 DIAGNOSIS — Z86010 Personal history of colonic polyps: Secondary | ICD-10-CM

## 2018-07-17 DIAGNOSIS — K219 Gastro-esophageal reflux disease without esophagitis: Secondary | ICD-10-CM | POA: Insufficient documentation

## 2018-07-17 NOTE — Telephone Encounter (Signed)
Patient needs suprep 

## 2018-07-17 NOTE — Telephone Encounter (Signed)
Referring MD/PCP: luking   Procedure: tcs w propofol  Reason/Indication:  Hx polyps  Has patient had this procedure before?  Yes, 2014  If so, when, by whom and where?    Is there a family history of colon cancer?  no  Who?  What age when diagnosed?    Is patient diabetic?   no      Does patient have prosthetic heart valve or mechanical valve?  no  Do you have a pacemaker?  no  Has patient ever had endocarditis? no  Has patient had joint replacement within last 12 months?  no  Is patient constipated or do they take laxatives? no  Does patient have a history of alcohol/drug use?  no  Is patient on blood thinner such as Coumadin, Plavix and/or Aspirin? no  Medications: see epic  Allergies: see epic  Medication Adjustment per Dr Lindi Adie, NP:   Procedure date & time: 08/09/18 at 730 preop 12/23 at 8

## 2018-07-18 MED ORDER — SUPREP BOWEL PREP KIT 17.5-3.13-1.6 GM/177ML PO SOLN: 1.0000 | Freq: Once | ORAL | 0 refills | Status: AC

## 2018-07-18 NOTE — Telephone Encounter (Signed)
agree

## 2018-07-22 ENCOUNTER — Telehealth: Payer: 59 | Admitting: Family

## 2018-07-22 DIAGNOSIS — K2970 Gastritis, unspecified, without bleeding: Secondary | ICD-10-CM

## 2018-07-22 MED ORDER — ONDANSETRON HCL 4 MG PO TABS: 4.0000 mg | ORAL_TABLET | Freq: Three times a day (TID) | ORAL | 0 refills | Status: DC | PRN

## 2018-07-22 NOTE — Progress Notes (Signed)
We are sorry that you are not feeling well. Here is how we plan to help!  Based on what you have shared with me it looks like you have a Virus that is irritating your GI tract.  Vomiting is the forceful emptying of a portion of the stomach's content through the mouth.  Although nausea and vomiting can make you feel miserable, it's important to remember that these are not diseases, but rather symptoms of an underlying illness.  When we treat short term symptoms, we always caution that any symptoms that persist should be fully evaluated in a medical office.  I have prescribed a medication that will help alleviate your symptoms and allow you to stay hydrated:  Zofran 4 mg 1 tablet every 8 hours as needed for nausea and vomiting. Usually flu symptoms include body aches, headache, fever, sneezing, cough and, runny nose.usually symptoms of nausea vomiting and diarrhea is a  different GI virus  HOME CARE:  Drink clear liquids.  This is very important! Dehydration (the lack of fluid) can lead to a serious complication.  Start off with 1 tablespoon every 5 minutes for 8 hours.  You may begin eating bland foods as,fter 8 hours without vomiting.  Start with saltine crackers, white bread, rice, mashed potatoes, applesauce.  After 48 hours on a bland diet, you may resume a normal diet.  Try to go to sleep.  Sleep often empties the stomach and relieves the need to vomit.  GET HELP RIGHT AWAY IF:   Your symptoms do not improve or worsen within 2 days after treatment.  You have a fever for over 3 days.  You cannot keep down fluids after trying the medication.  MAKE SURE YOU:   Understand these instructions.  Will watch your condition.  Will get help right away if you are not doing well or get worse.   Thank you for choosing an e-visit. Your e-visit answers were reviewed by a board certified advanced clinical practitioner to complete your personal care plan. Depending upon the condition, your  plan could have included both over the counter or prescription medications. Please review your pharmacy choice. Be sure that the pharmacy you have chosen is open so that you can pick up your prescription now.  If there is a problem you may message your provider in [place] to have the prescription routed to another pharmacy. Your safety is important to [place]. If you have drug allergies check your prescription carefully.  For the next 24 hours, you can use MyChart to ask questions about today's visit, request a non-urgent call back, or ask for a work or school excuse from your e-visit provider. You will get an e-mail in the next two days asking about your experience. I hope that your e-visit has been valuable and will speed your recovery.

## 2018-07-23 ENCOUNTER — Encounter: Payer: Self-pay | Admitting: Family Medicine

## 2018-07-23 MED ORDER — OSELTAMIVIR PHOSPHATE 75 MG PO CAPS: 75.0000 mg | ORAL_CAPSULE | Freq: Two times a day (BID) | ORAL | 0 refills | Status: DC

## 2018-07-25 ENCOUNTER — Encounter (HOSPITAL_COMMUNITY): Payer: Self-pay | Admitting: Anesthesiology

## 2018-07-29 ENCOUNTER — Other Ambulatory Visit (INDEPENDENT_AMBULATORY_CARE_PROVIDER_SITE_OTHER): Payer: Self-pay | Admitting: *Deleted

## 2018-07-29 DIAGNOSIS — K219 Gastro-esophageal reflux disease without esophagitis: Secondary | ICD-10-CM

## 2018-07-29 DIAGNOSIS — Z86010 Personal history of colonic polyps: Secondary | ICD-10-CM

## 2018-07-29 NOTE — Patient Instructions (Signed)
Theresa Theresa [name] July  07/29/2018     @PREFPERIOPPHARMACY @   Your procedure is scheduled on  08/09/2018 .  Report to [facility] [name] at  [address]   Theresa [name].M.  Call this number if you have problems the morning of surgery:  [phone]   Remember:  Follow the diet and prep instructions given to you by Dr Olevia Perches office.                       Take these medicines the morning of surgery with Theresa [name] SIP OF WATER  Xanax, celexa, dexilant.    Do not wear jewelry, make-up or nail polish.  Do not wear lotions, powders, or perfumes, or deodorant.  Do not shave 48 hours prior to surgery.  Men may shave face and neck.  Do not bring valuables to the hospital.  [facility] is not responsible for any belongings or valuables.  Contacts, dentures or bridgework may not be worn into surgery.  Leave your suitcase in the car.  After surgery it may be brought to your room.  For patients admitted to the hospital, discharge time will be determined by your treatment team.  Patients discharged the day of surgery will not be allowed to drive home.   Name and phone number of your driver:   Husband Special instructions:  None  Please read over the following fact sheets that you were given. Anesthesia Post-op Instructions and Care and Recovery After Surgery       Esophagogastroduodenoscopy Esophagogastroduodenoscopy (EGD) is Theresa [name] procedure to examine the lining of the esophagus, stomach, and first part of the small intestine (duodenum). This procedure is done to check for problems such as inflammation, bleeding, ulcers, or growths. During this procedure, Theresa [name] long, flexible, lighted tube with Theresa [name] camera attached (endoscope) is inserted down the throat. Tell Theresa [name] health care provider about:  Any allergies you have.  All medicines you are taking, including vitamins, herbs, eye drops, creams, and over-the-counter medicines.  Any problems you or family members have had with anesthetic medicines.  Any blood  disorders you have.  Any surgeries you have had.  Any medical conditions you have.  Whether you are pregnant or may be pregnant. What are the risks? Generally, this is Theresa [name] safe procedure. However, problems may occur, including:  Infection.  Bleeding.  Theresa [name] tear (perforation) in the esophagus, stomach, or duodenum.  Trouble breathing.  Excessive sweating.  Spasms of the larynx.  Theresa [name] slowed heartbeat.  Low blood pressure.  What happens before the procedure?  Follow instructions from your health care provider about eating or drinking restrictions.  Ask your health care provider about: ? Changing or stopping your regular medicines. This is especially important if you are taking diabetes medicines or blood thinners. ? Taking medicines such as aspirin and ibuprofen. These medicines can thin your blood. Do not take these medicines before your procedure if your health care provider instructs you not to.  Plan to have someone take you home after the procedure.  If you wear dentures, be ready to remove them before the procedure. What happens during the procedure?  To reduce your risk of infection, your health care team will wash or sanitize their hands.  An IV tube will be put in Theresa [name] vein in your hand or arm. You will get medicines and fluids through this tube.  You will be given one or more of the following: ? Theresa [name]  medicine to help you relax (sedative). ? Theresa [name] medicine to numb the area (local anesthetic). This medicine may be sprayed into your throat. It will make you feel more comfortable and keep you from gagging or coughing during the procedure. ? Theresa [name] medicine for pain.  Theresa [name] mouth guard may be placed in your mouth to protect your teeth and to keep you from biting on the endoscope.  You will be asked to lie on your left side.  The endoscope will be lowered down your throat into your esophagus, stomach, and duodenum.  Air will be put into the endoscope. This will help your health care  provider see better.  The lining of your esophagus, stomach, and duodenum will be examined.  Your health care provider may: ? Take Theresa [name] tissue sample so it can be looked at in Theresa [name] lab (biopsy). ? Remove growths. ? Remove objects (foreign bodies) that are stuck. ? Treat any bleeding with medicines or other devices that stop tissue from bleeding. ? Widen (dilate) or stretch narrowed areas of your esophagus and stomach.  The endoscope will be taken out. The procedure may vary among health care providers and hospitals. What happens after the procedure?  Your blood pressure, heart rate, breathing rate, and blood oxygen level will be monitored often until the medicines you were given have worn off.  Do not eat or drink anything until the numbing medicine has worn off and your gag reflex has returned. This information is not intended to replace advice given to you by your health care provider. Make sure you discuss any questions you have with your health care provider. Document Released: 12/01/2004 Document Revised: 01/06/2016 Document Reviewed: 06/24/2015 Elsevier Interactive Patient Education  2018 [employer]. Esophagogastroduodenoscopy, Care After Refer to this sheet in the next few weeks. These instructions provide you with information about caring for yourself after your procedure. Your health care provider may also give you more specific instructions. Your treatment has been planned according to current medical practices, but problems sometimes occur. Call your health care provider if you have any problems or questions after your procedure. What can I expect after the procedure? After the procedure, it is common to have:  Theresa [name] sore throat.  Nausea.  Bloating.  Dizziness.  Fatigue.  Follow these instructions at home:  Do not eat or drink anything until the numbing medicine (local anesthetic) has worn off and your gag reflex has returned. You will know that the local anesthetic has worn  off when you can swallow comfortably.  Do not drive for 24 hours if you received Theresa [name] medicine to help you relax (sedative).  If your health care provider took Theresa [name] tissue sample for testing during the procedure, make sure to get your test results. This is your responsibility. Ask your health care provider or the department performing the test when your results will be ready.  Keep all follow-up visits as told by your health care provider. This is important. Contact Theresa [name] health care provider if:  You cannot stop coughing.  You are not urinating.  You are urinating less than usual. Get help right away if:  You have trouble swallowing.  You cannot eat or drink.  You have throat or chest pain that gets worse.  You are dizzy or light-headed.  You faint.  You have nausea or vomiting.  You have chills.  You have Theresa [name] fever.  You have severe abdominal pain.  You have black, tarry, or bloody stools. This information is not intended to replace  advice given to you by your health care provider. Make sure you discuss any questions you have with your health care provider. Document Released: 07/17/2012 Document Revised: 01/06/2016 Document Reviewed: 06/24/2015 Elsevier Interactive Patient Education  2018 [employer].  Colonoscopy, Adult Theresa [name] colonoscopy is an exam to look at the large intestine. It is done to check for problems, such as:  Lumps (tumors).  Growths (polyps).  Swelling (inflammation).  Bleeding.  What happens before the procedure? Eating and drinking Follow instructions from your doctor about eating and drinking. These instructions may include:  Theresa [name] few days before the procedure - follow Theresa [name] low-fiber diet. ? Avoid nuts. ? Avoid seeds. ? Avoid dried fruit. ? Avoid raw fruits. ? Avoid vegetables.  1-3 days before the procedure - follow Theresa [name] clear liquid diet. Avoid liquids that have red or purple dye. Drink only clear liquids, such as: ? Clear broth or bouillon. ? Black  coffee or tea. ? Clear juice. ? Clear soft drinks or sports drinks. ? Gelatin dessert. ? Popsicles.  On the day of the procedure - do not eat or drink anything during the 2 hours before the procedure.  Bowel prep If you were prescribed an oral bowel prep:  Take it as told by your doctor. Starting the day before your procedure, you will need to drink Theresa [name] lot of liquid. The liquid will cause you to poop (have bowel movements) until your poop is almost clear or light green.  If your skin or butt gets irritated from diarrhea, you may: ? Wipe the area with wipes that have medicine in them, such as adult wet wipes with aloe and vitamin E. ? Put something on your skin that soothes the area, such as petroleum jelly.  If you throw up (vomit) while drinking the bowel prep, take Theresa [name] break for up to 60 minutes. Then begin the bowel prep again. If you keep throwing up and you cannot take the bowel prep without throwing up, call your doctor.  General instructions  Ask your doctor about changing or stopping your normal medicines. This is important if you take diabetes medicines or blood thinners.  Plan to have someone take you home from the hospital or clinic. What happens during the procedure?  An IV tube may be put into one of your veins.  You will be given medicine to help you relax (sedative).  To reduce your risk of infection: ? Your doctors will wash their hands. ? Your anal area will be washed with soap.  You will be asked to lie on your side with your knees bent.  Your doctor will get Theresa [name] long, thin, flexible tube ready. The tube will have Theresa [name] camera and Theresa [name] light on the end.  The tube will be put into your anus.  The tube will be gently put into your large intestine.  Air will be delivered into your large intestine to keep it open. You may feel some pressure or cramping.  The camera will be used to take photos.  Theresa [name] small tissue sample may be removed from your body to be looked at under Theresa [name]  microscope (biopsy). If any possible problems are found, the tissue will be sent to Theresa [name] lab for testing.  If small growths are found, your doctor may remove them and have them checked for cancer.  The tube that was put into your anus will be slowly removed. The procedure may vary among doctors and hospitals. What happens after the procedure?  Your doctor will check on  you often until the medicines you were given have worn off.  Do not drive for 24 hours after the procedure.  You may have Theresa [name] small amount of blood in your poop.  You may pass gas.  You may have mild cramps or bloating in your belly (abdomen).  It is up to you to get the results of your procedure. Ask your doctor, or the department performing the procedure, when your results will be ready. This information is not intended to replace advice given to you by your health care provider. Make sure you discuss any questions you have with your health care provider. Document Released: 09/02/2010 Document Revised: 05/31/2016 Document Reviewed: 10/12/2015 Elsevier Interactive Patient Education  2017 [employer]  Colonoscopy, Adult, Care After This sheet gives you information about how to care for yourself after your procedure. Your health care provider may also give you more specific instructions. If you have problems or questions, contact your health care provider. What can I expect after the procedure? After the procedure, it is common to have:  Theresa [name] small amount of blood in your stool for 24 hours after the procedure.  Some gas.  Mild abdominal cramping or bloating.  Follow these instructions at home: General instructions   For the first 24 hours after the procedure: ? Do not drive or use machinery. ? Do not sign important documents. ? Do not drink alcohol. ? Do your regular daily activities at Theresa [name] slower pace than normal. ? Eat soft, easy-to-digest foods. ? Rest often.  Take over-the-counter or prescription medicines  only as told by your health care provider.  It is up to you to get the results of your procedure. Ask your health care provider, or the department performing the procedure, when your results will be ready. Relieving cramping and bloating  Try walking around when you have cramps or feel bloated.  Apply heat to your abdomen as told by your health care provider. Use Theresa [name] heat source that your health care provider recommends, such as Theresa [name] moist heat pack or Theresa [name] heating pad. ? Place Theresa [name] towel between your skin and the heat source. ? Leave the heat on for 20-30 minutes. ? Remove the heat if your skin turns bright red. This is especially important if you are unable to feel pain, heat, or cold. You may have Theresa [name] greater risk of getting burned. Eating and drinking  Drink enough fluid to keep your urine clear or pale yellow.  Resume your normal diet as instructed by your health care provider. Avoid heavy or fried foods that are hard to digest.  Avoid drinking alcohol for as long as instructed by your health care provider. Contact Theresa [name] health care provider if:  You have blood in your stool 2-3 days after the procedure. Get help right away if:  You have more than Theresa [name] small spotting of blood in your stool.  You pass large blood clots in your stool.  Your abdomen is swollen.  You have nausea or vomiting.  You have Theresa [name] fever.  You have increasing abdominal pain that is not relieved with medicine. This information is not intended to replace advice given to you by your health care provider. Make sure you discuss any questions you have with your health care provider. Document Released: 03/14/2004 Document Revised: 04/24/2016 Document Reviewed: 10/12/2015 Elsevier Interactive Patient Education  2018 [place] Anesthesia is Theresa [name] term that refers to techniques, procedures, and medicines that help Theresa [name] person stay safe and comfortable during Theresa [name]  medical procedure. Monitored anesthesia care, or  sedation, is one type of anesthesia. Your anesthesia specialist may recommend sedation if you will be having Theresa [name] procedure that does not require you to be unconscious, such as:  Cataract surgery.  Theresa [name] dental procedure.  Theresa [name] biopsy.  Theresa [name] colonoscopy.  During the procedure, you may receive Theresa [name] medicine to help you relax (sedative). There are three levels of sedation:  Mild sedation. At this level, you may feel awake and relaxed. You will be able to follow directions.  Moderate sedation. At this level, you will be sleepy. You may not remember the procedure.  Deep sedation. At this level, you will be asleep. You will not remember the procedure.  The more medicine you are given, the deeper your level of sedation will be. Depending on how you respond to the procedure, the anesthesia specialist may change your level of sedation or the type of anesthesia to fit your needs. An anesthesia specialist will monitor you closely during the procedure. Let your health care provider know about:  Any allergies you have.  All medicines you are taking, including vitamins, herbs, eye drops, creams, and over-the-counter medicines.  Any use of steroids (by mouth or as Theresa [name] cream).  Any problems you or family members have had with sedatives and anesthetic medicines.  Any blood disorders you have.  Any surgeries you have had.  Any medical conditions you have, such as sleep apnea.  Whether you are pregnant or may be pregnant.  Any use of cigarettes, alcohol, or street drugs. What are the risks? Generally, this is Theresa [name] safe procedure. However, problems may occur, including:  Getting too much medicine (oversedation).  Nausea.  Allergic reaction to medicines.  Trouble breathing. If this happens, Theresa [name] breathing tube may be used to help with breathing. It will be removed when you are awake and breathing on your own.  Heart trouble.  Lung trouble.  Before the procedure Staying hydrated Follow instructions from  your health care provider about hydration, which may include:  Up to 2 hours before the procedure - you may continue to drink clear liquids, such as water, clear fruit juice, black coffee, and plain tea.  Eating and drinking restrictions Follow instructions from your health care provider about eating and drinking, which may include:  8 hours before the procedure - stop eating heavy meals or foods such as meat, fried foods, or fatty foods.  6 hours before the procedure - stop eating light meals or foods, such as toast or cereal.  6 hours before the procedure - stop drinking milk or drinks that contain milk.  2 hours before the procedure - stop drinking clear liquids.  Medicines Ask your health care provider about:  Changing or stopping your regular medicines. This is especially important if you are taking diabetes medicines or blood thinners.  Taking medicines such as aspirin and ibuprofen. These medicines can thin your blood. Do not take these medicines before your procedure if your health care provider instructs you not to.  Tests and exams  You will have Theresa [name] physical exam.  You may have blood tests done to show: ? How well your kidneys and liver are working. ? How well your blood can clot.  General instructions  Plan to have someone take you home from the hospital or clinic.  If you will be going home right after the procedure, plan to have someone with you for 24 hours.  What happens during the procedure?  Your blood pressure, heart rate, breathing,  level of pain and overall condition will be monitored.  An IV tube will be inserted into one of your veins.  Your anesthesia specialist will give you medicines as needed to keep you comfortable during the procedure. This may mean changing the level of sedation.  The procedure will be performed. After the procedure  Your blood pressure, heart rate, breathing rate, and blood oxygen level will be monitored until the medicines  you were given have worn off.  Do not drive for 24 hours if you received Theresa [name] sedative.  You may: ? Feel sleepy, clumsy, or nauseous. ? Feel forgetful about what happened after the procedure. ? Have Theresa [name] sore throat if you had Theresa [name] breathing tube during the procedure. ? Vomit. This information is not intended to replace advice given to you by your health care provider. Make sure you discuss any questions you have with your health care provider. Document Released: 04/26/2005 Document Revised: 01/07/2016 Document Reviewed: 11/21/2015 Elsevier Interactive Patient Education  2018 [place], Care After These instructions provide you with information about caring for yourself after your procedure. Your health care provider may also give you more specific instructions. Your treatment has been planned according to current medical practices, but problems sometimes occur. Call your health care provider if you have any problems or questions after your procedure. What can I expect after the procedure? After your procedure, it is common to:  Feel sleepy for several hours.  Feel clumsy and have poor balance for several hours.  Feel forgetful about what happened after the procedure.  Have poor judgment for several hours.  Feel nauseous or vomit.  Have Theresa [name] sore throat if you had Theresa [name] breathing tube during the procedure.  Follow these instructions at home: For at least 24 hours after the procedure:   Do not: ? Participate in activities in which you could fall or become injured. ? Drive. ? Use heavy machinery. ? Drink alcohol. ? Take sleeping pills or medicines that cause drowsiness. ? Make important decisions or sign legal documents. ? Take care of children on your own.  Rest. Eating and drinking  Follow the diet that is recommended by your health care provider.  If you vomit, drink water, juice, or soup when you can drink without vomiting.  Make sure you have little  or no nausea before eating solid foods. General instructions  Have Theresa [name] responsible adult stay with you until you are awake and alert.  Take over-the-counter and prescription medicines only as told by your health care provider.  If you smoke, do not smoke without supervision.  Keep all follow-up visits as told by your health care provider. This is important. Contact Theresa [name] health care provider if:  You keep feeling nauseous or you keep vomiting.  You feel light-headed.  You develop Theresa [name] rash.  You have Theresa [name] fever. Get help right away if:  You have trouble breathing. This information is not intended to replace advice given to you by your health care provider. Make sure you discuss any questions you have with your health care provider. Document Released: 11/21/2015 Document Revised: 03/22/2016 Document Reviewed: 11/21/2015 Elsevier Interactive Patient Education  [employer].

## 2018-07-30 ENCOUNTER — Encounter (INDEPENDENT_AMBULATORY_CARE_PROVIDER_SITE_OTHER): Payer: Self-pay

## 2018-08-02 ENCOUNTER — Encounter (HOSPITAL_COMMUNITY): Payer: Self-pay

## 2018-08-02 LAB — BASIC METABOLIC PANEL
Anion gap: 9 (ref 5–15)
BUN: 13 mg/dL (ref 6–20)
CO2: 25 mmol/L (ref 22–32)
Calcium: 8.7 mg/dL — ABNORMAL LOW (ref 8.9–10.3)
Chloride: 102 mmol/L (ref 98–111)
Creatinine, Ser: 0.81 mg/dL (ref 0.44–1.00)
GFR calc Af Amer: >60 mL/min (ref >60)
GFR calc non Af Amer: >60 mL/min (ref >60)
Glucose, Bld: 111 mg/dL — ABNORMAL HIGH (ref 70–99)
Potassium: 3.8 mmol/L (ref 3.5–5.1)
Sodium: 136 mmol/L (ref 135–145)

## 2018-08-02 LAB — HCG, SERUM, QUALITATIVE: Preg, Serum: NEGATIVE

## 2018-08-05 ENCOUNTER — Encounter (HOSPITAL_COMMUNITY): Payer: Self-pay

## 2018-08-05 ENCOUNTER — Encounter (HOSPITAL_COMMUNITY)
Admission: RE | Admit: 2018-08-05 | Discharge: 2018-08-05 | Disposition: A | Discharge status: Home / Self Care | Payer: 59 | Source: Ambulatory Visit | Attending: Internal Medicine | Admitting: Internal Medicine

## 2018-08-05 ENCOUNTER — Inpatient Hospital Stay (HOSPITAL_COMMUNITY): Admission: RE | Admit: 2018-08-05 | Payer: Self-pay | Source: Ambulatory Visit

## 2018-08-05 DIAGNOSIS — Z86010 Personal history of colonic polyps: Secondary | ICD-10-CM

## 2018-08-05 DIAGNOSIS — K219 Gastro-esophageal reflux disease without esophagitis: Secondary | ICD-10-CM

## 2018-08-09 ENCOUNTER — Ambulatory Visit (HOSPITAL_COMMUNITY): Payer: 59 | Admitting: Anesthesiology

## 2018-08-09 ENCOUNTER — Encounter (HOSPITAL_COMMUNITY): Payer: Self-pay | Admitting: *Deleted

## 2018-08-09 ENCOUNTER — Encounter (HOSPITAL_COMMUNITY)
Admission: RE | Disposition: A | Discharge status: Home / Self Care | Payer: Self-pay | Source: Home / Self Care | Attending: Internal Medicine

## 2018-08-09 ENCOUNTER — Ambulatory Visit (HOSPITAL_COMMUNITY)
Admission: RE | Admit: 2018-08-09 | Discharge: 2018-08-09 | Disposition: A | Discharge status: Home / Self Care | Payer: 59 | Attending: Internal Medicine | Admitting: Internal Medicine

## 2018-08-09 DIAGNOSIS — K3189 Other diseases of stomach and duodenum: Secondary | ICD-10-CM | POA: Insufficient documentation

## 2018-08-09 DIAGNOSIS — K228 Other specified diseases of esophagus: Secondary | ICD-10-CM | POA: Insufficient documentation

## 2018-08-09 DIAGNOSIS — K219 Gastro-esophageal reflux disease without esophagitis: Secondary | ICD-10-CM | POA: Diagnosis not present

## 2018-08-09 DIAGNOSIS — K644 Residual hemorrhoidal skin tags: Secondary | ICD-10-CM | POA: Diagnosis not present

## 2018-08-09 DIAGNOSIS — K6289 Other specified diseases of anus and rectum: Secondary | ICD-10-CM | POA: Insufficient documentation

## 2018-08-09 DIAGNOSIS — R7303 Prediabetes: Secondary | ICD-10-CM | POA: Insufficient documentation

## 2018-08-09 DIAGNOSIS — Z09 Encounter for follow-up examination after completed treatment for conditions other than malignant neoplasm: Secondary | ICD-10-CM | POA: Diagnosis not present

## 2018-08-09 DIAGNOSIS — Z79899 Other long term (current) drug therapy: Secondary | ICD-10-CM | POA: Diagnosis not present

## 2018-08-09 DIAGNOSIS — E669 Obesity, unspecified: Secondary | ICD-10-CM | POA: Insufficient documentation

## 2018-08-09 DIAGNOSIS — K317 Polyp of stomach and duodenum: Secondary | ICD-10-CM | POA: Insufficient documentation

## 2018-08-09 DIAGNOSIS — F329 Major depressive disorder, single episode, unspecified: Secondary | ICD-10-CM | POA: Diagnosis not present

## 2018-08-09 DIAGNOSIS — Z793 Long term (current) use of hormonal contraceptives: Secondary | ICD-10-CM | POA: Diagnosis not present

## 2018-08-09 DIAGNOSIS — D123 Benign neoplasm of transverse colon: Secondary | ICD-10-CM | POA: Insufficient documentation

## 2018-08-09 DIAGNOSIS — Z1211 Encounter for screening for malignant neoplasm of colon: Secondary | ICD-10-CM | POA: Diagnosis not present

## 2018-08-09 DIAGNOSIS — F419 Anxiety disorder, unspecified: Secondary | ICD-10-CM | POA: Insufficient documentation

## 2018-08-09 DIAGNOSIS — K589 Irritable bowel syndrome without diarrhea: Secondary | ICD-10-CM | POA: Insufficient documentation

## 2018-08-09 DIAGNOSIS — Z860100 Personal history of colon polyps, unspecified: Secondary | ICD-10-CM | POA: Insufficient documentation

## 2018-08-09 DIAGNOSIS — Z6832 Body mass index (BMI) 32.0-32.9, adult: Secondary | ICD-10-CM | POA: Insufficient documentation

## 2018-08-09 DIAGNOSIS — Z975 Presence of (intrauterine) contraceptive device: Secondary | ICD-10-CM | POA: Diagnosis not present

## 2018-08-09 DIAGNOSIS — Z8249 Family history of ischemic heart disease and other diseases of the circulatory system: Secondary | ICD-10-CM | POA: Insufficient documentation

## 2018-08-09 HISTORY — PX: BIOPSY: SHX5522

## 2018-08-09 HISTORY — PX: ESOPHAGOGASTRODUODENOSCOPY (EGD) WITH PROPOFOL: SHX5813

## 2018-08-09 HISTORY — PX: COLONOSCOPY WITH PROPOFOL: SHX5780

## 2018-08-09 HISTORY — PX: POLYPECTOMY: SHX5525

## 2018-08-09 SURGERY — COLONOSCOPY WITH PROPOFOL
Anesthesia: Monitor Anesthesia Care

## 2018-08-09 SURGERY — COLONOSCOPY WITH PROPOFOL
Anesthesia: General

## 2018-08-09 MED ORDER — PROPOFOL 500 MG/50ML IV EMUL
INTRAVENOUS | Status: DC | PRN
  Administered 2018-08-09 (×2): via INTRAVENOUS
  Administered 2018-08-09: 150 ug/kg/min via INTRAVENOUS

## 2018-08-09 MED ORDER — MIDAZOLAM HCL 5 MG/5ML IJ SOLN
INTRAMUSCULAR | Status: DC | PRN
  Administered 2018-08-09: 2 mg via INTRAVENOUS

## 2018-08-09 MED ORDER — CHLORHEXIDINE GLUCONATE CLOTH 2 % EX PADS: 6.0000 | MEDICATED_PAD | Freq: Once | CUTANEOUS | Status: DC

## 2018-08-09 MED ORDER — MIDAZOLAM HCL 2 MG/2ML IJ SOLN: 0.5000 mg | Freq: Once | INTRAMUSCULAR | Status: DC | PRN

## 2018-08-09 MED ORDER — PROPOFOL 10 MG/ML IV BOLUS
INTRAVENOUS | Status: DC | PRN
  Administered 2018-08-09: 30 mg via INTRAVENOUS
  Administered 2018-08-09 (×2): 20 mg via INTRAVENOUS

## 2018-08-09 MED ORDER — LACTATED RINGERS IV SOLN
INTRAVENOUS | Status: DC
  Administered 2018-08-09: 1000 mL via INTRAVENOUS

## 2018-08-09 MED ORDER — HYDROCODONE-ACETAMINOPHEN 7.5-325 MG PO TABS: 1.0000 | ORAL_TABLET | Freq: Once | ORAL | Status: DC | PRN

## 2018-08-09 MED ORDER — PROMETHAZINE HCL 25 MG/ML IJ SOLN: 6.2500 mg | INTRAMUSCULAR | Status: DC | PRN

## 2018-08-09 MED ORDER — LIDOCAINE HCL 1 % IJ SOLN
INTRAMUSCULAR | Status: DC | PRN
  Administered 2018-08-09: 50 mg via INTRADERMAL

## 2018-08-09 MED ORDER — PROPOFOL 10 MG/ML IV BOLUS
INTRAVENOUS | Status: AC
  Filled 2018-08-09: qty 60

## 2018-08-09 MED ORDER — RABEPRAZOLE SODIUM 20 MG PO TBEC: 20.0000 mg | DELAYED_RELEASE_TABLET | Freq: Two times a day (BID) | ORAL | 2 refills | Status: DC

## 2018-08-09 MED ORDER — HYDROMORPHONE HCL 1 MG/ML IJ SOLN: 0.2500 mg | INTRAMUSCULAR | Status: DC | PRN

## 2018-08-09 MED ORDER — MIDAZOLAM HCL 2 MG/2ML IJ SOLN
INTRAMUSCULAR | Status: AC
  Filled 2018-08-09: qty 2

## 2018-08-09 NOTE — Op Note (Signed)
[facility] Patient Name: Theresa [name] Procedure Date: 08/09/2018 7:23 AM MRN: [id number] Date of Birth: [birth year] Attending MD: Hildred Laser , MD CSN: [id number] Age: 47 Admit Type: Outpatient Procedure:                Upper GI endoscopy Indications:              Follow-up of gastro-esophageal reflux disease Providers:                Hildred Laser, MD, Otis Peak B. Sharon Seller, RN, Randa Spike, Technician Referring MD:             Grace Bushy. Luking, MD Medicines:                Propofol per Anesthesia Complications:            No immediate complications. Estimated Blood Loss:     Estimated blood loss was minimal. Procedure:                Pre-Anesthesia Assessment:                           - Prior to the procedure, a History and Physical                            was performed, and patient medications and                            allergies were reviewed. The patient's tolerance of                            previous anesthesia was also reviewed. The risks                            and benefits of the procedure and the sedation                            options and risks were discussed with the patient.                            All questions were answered, and informed consent                            was obtained. Prior Anticoagulants: The patient                            last took ibuprofen 7 days prior to the procedure.                            ASA Grade Assessment: II - A patient with mild                            systemic disease. After reviewing the risks and  benefits, the patient was deemed in satisfactory                            condition to undergo the procedure.                           After obtaining informed consent, the endoscope was                            passed under direct vision. Throughout the                            procedure, the patient's blood pressure, pulse, and              oxygen saturations were monitored continuously. The                            [id number]-H190 ([id number]) scope was introduced through the                            and advanced to the second part of duodenum. The                            upper GI endoscopy was accomplished without                            difficulty. The patient tolerated the procedure                            well. Scope In: 7:39:02 AM Scope Out: 7:46:15 AM Total Procedure Duration: 0 hours 7 minutes 13 seconds  Findings:      The examined esophagus was normal.      The Z-line was irregular and was found 39 cm from the incisors.      Multiple 4 to 10 mm pedunculated and sessile polyps with no stigmata of       recent bleeding were found in the gastric fundus and in the gastric       body. Biopsies were taken with a cold forceps for histology. The       pathology specimen was placed into Bottle Number 1.      Localized mildly erythematous mucosa without bleeding was found in the       gastric body.      The exam of the stomach was otherwise normal.      The duodenal bulb and second portion of the duodenum were normal. Impression:               - Normal esophagus.                           - Z-line irregular, 39 cm from the incisors.                           - Multiple gastric polyps. Biopsied.                           - Erythematous mucosa in  the gastric body( possibly                            due to heaving).                           - Normal duodenal bulb and second portion of the                            duodenum. Moderate Sedation:      Per Anesthesia Care Recommendation:           - Patient has a contact number available for                            emergencies. The signs and symptoms of potential                            delayed complications were discussed with the                            patient. Return to normal activities tomorrow.                            Written discharge  instructions were provided to the                            patient.                           - Resume previous diet today.                           - Continue present medications but discontinue                            Dexlansoprazole.                           - No aspirin, ibuprofen, naproxen, or other                            non-steroidal anti-inflammatory drugs for 1 day.                           - Await pathology results.                           - Use Aciphex (rabeprazole) 20 mg PO BID.                           - OV in 3 months. Procedure Code(s):        --- Professional ---                           [phone], Esophagogastroduodenoscopy, flexible,  transoral; with biopsy, single or multiple Diagnosis Code(s):        --- Professional ---                           K22.8, Other specified diseases of esophagus                           K31.7, Polyp of stomach and duodenum                           K31.89, Other diseases of stomach and duodenum                           K21.9, Gastro-esophageal reflux disease without                            esophagitis CPT copyright 2018 American Medical Association. All rights reserved. The codes documented in this report are preliminary and upon coder review may  be revised to meet current compliance requirements. Hildred Laser, MD Hildred Laser, MD 08/09/2018 8:25:50 AM This report has been signed electronically. Number of Addenda: 0

## 2018-08-09 NOTE — Anesthesia Postprocedure Evaluation (Signed)
Anesthesia Post Note  Patient: Theresa [name]  Procedure(s) Performed: COLONOSCOPY WITH PROPOFOL (N/A ) ESOPHAGOGASTRODUODENOSCOPY (EGD) WITH PROPOFOL (N/A ) BIOPSY POLYPECTOMY  Patient location during evaluation: PACU Anesthesia Type: General Level of consciousness: awake and alert and oriented Pain management: pain level controlled Vital Signs Assessment: post-procedure vital signs reviewed and stable Respiratory status: spontaneous breathing Cardiovascular status: blood pressure returned to baseline and stable Postop Assessment: no apparent nausea or vomiting Anesthetic complications: no     Last Vitals:  Vitals:   08/09/18 0633 08/09/18 0822  BP: 118/84   Pulse: 78   Resp: 16   Temp: 36.6 C (P) 36.8 C  SpO2: 96%     Last Pain:  Vitals:   08/09/18 0734  TempSrc:   PainSc: 0-No pain                 Rawn Quiroa

## 2018-08-09 NOTE — Transfer of Care (Signed)
Immediate Anesthesia Transfer of Care Note  Patient: Theresa [name]  Procedure(s) Performed: COLONOSCOPY WITH PROPOFOL (N/A ) ESOPHAGOGASTRODUODENOSCOPY (EGD) WITH PROPOFOL (N/A ) BIOPSY POLYPECTOMY  Patient Location: PACU  Anesthesia Type:General  Level of Consciousness: awake and alert   Airway & Oxygen Therapy: Patient Spontanous Breathing  Post-op Assessment: Report given to RN  Post vital signs: Reviewed and stable  Last Vitals:  Vitals Value Taken Time  BP 105/72 08/09/2018  8:22 AM  Temp    Pulse 73 08/09/2018  8:24 AM  Resp 14 08/09/2018  8:24 AM  SpO2 100 % 08/09/2018  8:24 AM  Vitals shown include unvalidated device data.  Last Pain:  Vitals:   08/09/18 0734  TempSrc:   PainSc: 0-No pain      Patients Stated Pain Goal: 8 (82/41/75 3010)  Complications: No apparent anesthesia complications

## 2018-08-09 NOTE — Anesthesia Preprocedure Evaluation (Signed)
Anesthesia Evaluation  Patient identified by MRN, date of birth, ID band Patient awake    Reviewed: Allergy & Precautions, NPO status , Patient's Chart, lab work & pertinent test results  Airway Mallampati: II  TM Distance: >3 FB Neck ROM: Full    Dental no notable dental hx. (+) Teeth Intact   Pulmonary neg pulmonary ROS,    Pulmonary exam normal breath sounds clear to auscultation       Cardiovascular Exercise Tolerance: Good negative cardio ROS Normal cardiovascular examI Rhythm:Regular Rate:Normal     Neuro/Psych Anxiety Depression negative neurological ROS  negative psych ROS   GI/Hepatic Neg liver ROS, GERD  Medicated and Controlled,States feels heavy - here for EGD/Colon    Endo/Other  negative endocrine ROS  Renal/GU negative Renal ROS  negative genitourinary   Musculoskeletal negative musculoskeletal ROS (+)   Abdominal   Peds negative pediatric ROS (+)  Hematology negative hematology ROS (+)   Anesthesia Other Findings   Reproductive/Obstetrics negative OB ROS                             Anesthesia Physical Anesthesia Plan  ASA: II  Anesthesia Plan: General   Post-op Pain Management:    Induction: Intravenous  PONV Risk Score and Plan:   Airway Management Planned: Nasal Cannula and Simple Face Mask  Additional Equipment:   Intra-op Plan:   Post-operative Plan:   Informed Consent: I have reviewed the patients History and Physical, chart, labs and discussed the procedure including the risks, benefits and alternatives for the proposed anesthesia with the patient or authorized representative who has indicated his/her understanding and acceptance.   Dental advisory given  Plan Discussed with: CRNA  Anesthesia Plan Comments:         Anesthesia Quick Evaluation

## 2018-08-09 NOTE — Op Note (Signed)
[facility] Patient Name: Theresa [name] Procedure Date: 08/09/2018 7:49 AM MRN: [id number] Date of Birth: April 02, 1971 Attending MD: Hildred Laser , MD CSN: [id number] Age: 47 Admit Type: Outpatient Procedure:                Colonoscopy Indications:              High risk colon cancer surveillance: Personal                            history of colonic polyps Providers:                Hildred Laser, MD, Gwenlyn Fudge RN, RN, Randa Spike, Technician Referring MD:             Grace Bushy. Luking, MD Medicines:                Propofol per Anesthesia Complications:            No immediate complications. Estimated Blood Loss:     Estimated blood loss was minimal. Procedure:                Pre-Anesthesia Assessment:                           - Prior to the procedure, a History and Physical                            was performed, and patient medications and                            allergies were reviewed. The patient's tolerance of                            previous anesthesia was also reviewed. The risks                            and benefits of the procedure and the sedation                            options and risks were discussed with the patient.                            All questions were answered, and informed consent                            was obtained. Prior Anticoagulants: The patient                            last took ibuprofen 7 days prior to the procedure.                            ASA Grade Assessment: II - A patient with mild  systemic disease. After reviewing the risks and                            benefits, the patient was deemed in satisfactory                            condition to undergo the procedure.                           After obtaining informed consent, the colonoscope                            was passed under direct vision. Throughout the                            procedure, the  patient's blood pressure, pulse, and                            oxygen saturations were monitored continuously. The                            PCF-H190DL ([id number]) was introduced through the                            anus and advanced to the the cecum, identified by                            appendiceal orifice and ileocecal valve. The                            colonoscopy was somewhat difficult due to a                            redundant colon. The patient tolerated the                            procedure well. The quality of the bowel                            preparation was good. The ileocecal valve,                            appendiceal orifice, and rectum were photographed. Scope In: 7:50:33 AM Scope Out: 8:14:58 AM Scope Withdrawal Time: 0 hours 16 minutes 51 seconds  Total Procedure Duration: 0 hours 24 minutes 25 seconds  Findings:      Skin tags were found on perianal exam.      The digital rectal exam was normal.      A 5 mm polyp was found in the splenic flexure. The polyp was sessile.       The polyp was removed with a cold snare. Resection and retrieval were       complete. The pathology specimen was placed into Bottle Number 2.      The exam was otherwise normal throughout the examined colon.  A scar was found in the distal rectum.      External hemorrhoids were found during retroflexion. The hemorrhoids       were small. Impression:               - Perianal skin tags found on perianal exam.                           - One 5 mm polyp at the splenic flexure, removed                            with a cold snare. Resected and retrieved.                           - Scar in the distal rectum.                           - External hemorrhoids. Moderate Sedation:      Per Anesthesia Care Recommendation:           - Patient has a contact number available for                            emergencies. The signs and symptoms of potential                             delayed complications were discussed with the                            patient. Return to normal activities tomorrow.                            Written discharge instructions were provided to the                            patient.                           - Resume previous diet today.                           - Continue present medications.                           - No aspirin, ibuprofen, naproxen, or other                            non-steroidal anti-inflammatory drugs for 1 day.                           - Await pathology results.                           - Repeat colonoscopy is recommended. The                            colonoscopy date will be determined after pathology  results from today's exam become available for                            review. Procedure Code(s):        --- Professional ---                           [phone], Colonoscopy, flexible; with removal of                            tumor(s), polyp(s), or other lesion(s) by snare                            technique Diagnosis Code(s):        --- Professional ---                           K64.4, Residual hemorrhoidal skin tags                           Z86.010, Personal history of colonic polyps                           D12.3, Benign neoplasm of transverse colon (hepatic                            flexure or splenic flexure)                           K62.89, Other specified diseases of anus and rectum CPT copyright 2018 American Medical Association. All rights reserved. The codes documented in this report are preliminary and upon coder review may  be revised to meet current compliance requirements. Hildred Laser, MD Hildred Laser, MD 08/09/2018 8:29:47 AM This report has been signed electronically. Number of Addenda: 0

## 2018-08-09 NOTE — H&P (Signed)
Theresa [name] is an 47 y.o. female.   Chief Complaint: Patient is here for EGD and colonoscopy. HPI: Patient is 47 year old Caucasian female, in and who has a history of colonic adenoma and is here for surveillance colonoscopy.  She is also undergoing diagnostic EGD because of symptoms of GERD poorly controlled with medication.  She has tried at least 3 different PPIs.  She did not have any benefit with omeprazole and pantoprazole.  She has done well with dexlansoprazole but lately it is not working.  She has chest tightness heartburn at least 5 days a week as well as regurgitation.  She is watching her diet.  She denies rectal bleeding or melena but she has irregular bowel movements and she has urgency.  She also has intermittent pain in both lower quadrants.  In the past she has had constipation.  She is felt to have IBS. Family history is negative for CRC. Paternal uncle was diagnosed with Crohn's disease in his 90s and is in remission at 65.  Past Medical History:  Diagnosis Date  . Allergy   . Anxiety   . Colon adenoma DEC 2014   ONE(7 MM) SIMPLE([place])  . Depression   . Edema   . GERD (gastroesophageal reflux disease)   . History of abnormal cervical Pap smear 12/02/2013  . History of nephrolithiasis   . HPV (human papilloma virus) infection   . Hyperlipidemia   . IBS (irritable bowel syndrome)    constipation  . Insomnia   . Obesity 01/14/2014  . Pre-diabetes 09/2014  . Yeast infection 09/04/2013    Past Surgical History:  Procedure Laterality Date  . BIOPSY N/A 08/01/2013   Procedure: BIOPSY;  Surgeon: Danie Binder, MD;  Location: [facility];  Service: Endoscopy;  Laterality: N/A;  FOR MICROSCOPIC COLITIS  . BREAST REDUCTION SURGERY  2009  . BREAST SURGERY  2007   breast reduction  . CHOLECYSTECTOMY N/A 11/04/2014   Procedure: LAPAROSCOPIC CHOLECYSTECTOMY;  Surgeon: Aviva Signs Md, MD;  Location: [facility];  Service: General;  Laterality: N/A;  . COLONOSCOPY  Oct 2009   Dr.  Carlean Purl: int/ext hemorrhoids, ?mild proctitis but path benign, likely r/t irritation from bowel prep  . COLONOSCOPY N/A 08/01/2013   Procedure: COLONOSCOPY;  Surgeon: Danie Binder, MD;  Location: [facility];  Service: Endoscopy;  Laterality: N/A;  12:00  . ESOPHAGOGASTRODUODENOSCOPY  Dec 2009   Dr. Carlean Purl: reflux esophagitis, proximal gastric polyps, benign  . ESOPHAGOGASTRODUODENOSCOPY N/A 12/18/2014   Procedure: ESOPHAGOGASTRODUODENOSCOPY (EGD);  Surgeon: Rogene Houston, MD;  Location: [facility];  Service: Endoscopy;  Laterality: N/A;  230  . HEMORRHOID BANDING N/A 08/01/2013   Procedure: HEMORRHOID BANDING;  Surgeon: Danie Binder, MD;  Location: [facility];  Service: Endoscopy;  Laterality: N/A;  internal hemorrhoid banding  . LIVER BIOPSY N/A 11/04/2014   Procedure: LIVER BIOPSY;  Surgeon: Aviva Signs Md, MD;  Location: [facility];  Service: General;  Laterality: N/A;  . POLYPECTOMY  2015   Dr.Fields  . REDUCTION MAMMAPLASTY Bilateral 2006  . UPPER GASTROINTESTINAL ENDOSCOPY  2006   Dr. Nita Sickle in [place]    Family History  Problem Relation Age of Onset  . Cancer Mother        head and neck  . Hypertension Father   . Hyperlipidemia Father   . Pancreatic cancer Maternal Grandfather   . Colon cancer Neg Hx    Social History:  reports that she has never smoked. She has never used  smokeless tobacco. She reports current alcohol use. She reports that she does not use drugs.  Allergies:  Allergies  Allergen Reactions  . Phentermine Palpitations    Medications Prior to Admission  Medication Sig Dispense Refill  . ALPRAZolam (XANAX) 1 MG tablet TAKE 1/2 TO 1 TABLET BY MOUTH DURING THE DAY FOR SEVERE ANXIETY AND 1 TO 2 TABLETS EVERY NIGHT AT BEDTIME FOR SLEEP (Patient taking differently: Take 1 mg by mouth 2 (two) times daily. ) 90 tablet 2  . citalopram (CELEXA) 40 MG tablet Take 1 tablet (40 mg total) by mouth daily. (Patient taking differently: Take 40 mg by mouth at  bedtime. ) 90 tablet 0  . cycloSPORINE (RESTASIS) 0.05 % ophthalmic emulsion Place 1 drop into both eyes 2 (two) times daily.    Marland Kitchen DEXILANT 60 MG capsule TAKE 1 CAPSULE BY MOUTH DAILY. (Patient taking differently: Take 60 mg by mouth daily. ) 30 capsule 5  . dicyclomine (BENTYL) 20 MG tablet Take 20 mg by mouth daily as needed for spasms.    Marland Kitchen ibuprofen (ADVIL,MOTRIN) 200 MG tablet Take 400 mg by mouth every 6 (six) hours as needed for headache or moderate pain.    Marland Kitchen levonorgestrel (MIRENA) 20 MCG/24HR IUD 1 each by Intrauterine route once.    . Melatonin 5 MG CAPS Take 10 mg by mouth at bedtime.     . Multiple Vitamins-Minerals (MULTIVITAMIN PO) Take 1 tablet by mouth daily.    Marland Kitchen oseltamivir (TAMIFLU) 75 MG capsule Take 1 capsule (75 mg total) by mouth 2 (two) times daily. 10 capsule 0  . ranitidine (ZANTAC) 150 MG tablet Take 150 mg by mouth at bedtime.  99  . ondansetron (ZOFRAN) 4 MG tablet Take 1 tablet (4 mg total) by mouth every 8 (eight) hours as needed for nausea or vomiting. (Patient not taking: Reported on 07/26/2018) 20 tablet 0  . valACYclovir (VALTREX) 1000 MG tablet TAKE 2 TABLETS NOW THEN TAKE 2 TABLETS IN THE MORNING (Patient not taking: Reported on 07/26/2018) 10 tablet 3    No results found for this or any previous visit (from the past 48 hour(s)). No results found.  ROS  Blood pressure 118/84, pulse 78, temperature 97.8 F (36.6 C), temperature source Oral, resp. rate 16, height 5\' 6"  (1.676 m), weight 90.7 kg, SpO2 96 %. Physical Exam  Constitutional: She appears well-developed and well-nourished.  HENT:  Mouth/Throat: Oropharynx is clear and moist.  Eyes: Conjunctivae are normal. No scleral icterus.  Neck: No thyromegaly present.  Cardiovascular: Normal rate, regular rhythm and normal heart sounds.  No murmur heard. Respiratory: Effort normal and breath sounds normal.  GI:  Abdomen is full but soft and nontender with organomegaly or masses.  Musculoskeletal:         General: No edema.  Lymphadenopathy:    She has no cervical adenopathy.  Neurological: She is alert.  Skin: Skin is warm and dry.     Assessment/Plan Chronic GERD with poor symptom control. History of colonic adenoma. Diagnostic EGD and surveillance colonoscopy.  Hildred Laser, MD 08/09/2018, 7:17 AM

## 2018-08-09 NOTE — Discharge Instructions (Signed)
Colonoscopy, Adult, Care After This sheet gives you information about how to care for yourself after your procedure. Your doctor may also give you more specific instructions. If you have problems or questions, call your doctor. What can I expect after the procedure? After the procedure, it is common to have:  A small amount of blood in your poop for 24 hours.  Some gas.  Mild cramping or bloating in your belly. Follow these instructions at home: General instructions  For the first 24 hours after the procedure: ? Do not drive or use machinery. ? Do not sign important documents. ? Do not drink alcohol. ? Do your daily activities more slowly than normal. ? Eat foods that are soft and easy to digest.  Take over-the-counter or prescription medicines only as told by your doctor. To help cramping and bloating:   Try walking around.  Put heat on your belly (abdomen) as told by your doctor. Use a heat source that your doctor recommends, such as a moist heat pack or a heating pad. ? Put a towel between your skin and the heat source. ? Leave the heat on for 20-30 minutes. ? Remove the heat if your skin turns bright red. This is especially important if you cannot feel pain, heat, or cold. You can get burned. Eating and drinking   Drink enough fluid to keep your pee (urine) clear or pale yellow.  Return to your normal diet as told by your doctor. Avoid heavy or fried foods that are hard to digest.  Avoid drinking alcohol for as long as told by your doctor. Contact a doctor if:  You have blood in your poop (stool) 2-3 days after the procedure. Get help right away if:  You have more than a small amount of blood in your poop.  You see large clumps of tissue (blood clots) in your poop.  Your belly is swollen.  You feel sick to your stomach (nauseous).  You throw up (vomit).  You have a fever.  You have belly pain that gets worse, and medicine does not help your  pain. Summary  After the procedure, it is common to have a small amount of blood in your poop. You may also have mild cramping and bloating in your belly.  For the first 24 hours after the procedure, do not drive or use machinery, do not sign important documents, and do not drink alcohol.  Get help right away if you have a lot of blood in your poop, feel sick to your stomach, have a fever, or have more belly pain. This information is not intended to replace advice given to you by your health care provider. Make sure you discuss any questions you have with your health care provider. Document Released: 09/02/2010 Document Revised: 05/31/2017 Document Reviewed: 04/24/2016 Elsevier Interactive Patient Education  2019 [facility] [facility], Adult, Care After This sheet gives you information about how to care for yourself after your procedure. Your health care provider may also give you more specific instructions. If you have problems or questions, contact your health care provider. What can I expect after the procedure? After the procedure, it is common to have:  A sore throat.  Mild stomach pain or discomfort.  Bloating.  Nausea. Follow these instructions at home:   Follow instructions from your health care provider about what to eat or drink after your procedure.  Return to your normal activities as told by your health care provider. Ask your health care provider  what activities are safe for you.  Take over-the-counter and prescription medicines only as told by your health care provider.  Do not drive for 24 hours if you were given a sedative during your procedure.  Keep all follow-up visits as told by your health care provider. This is important. Contact a health care provider if you have:  A sore throat that lasts longer than one day.  Trouble swallowing. Get help right away if:  You vomit blood or your vomit looks like coffee grounds.  You have: ? A  fever. ? Bloody, black, or tarry stools. ? A severe sore throat or you cannot swallow. ? Difficulty breathing. ? Severe pain in your chest or abdomen. Summary  After the procedure, it is common to have a sore throat, mild stomach discomfort, bloating, and nausea.  Do not drive for 24 hours if you were given a sedative during the procedure.  Follow instructions from your health care provider about what to eat or drink after your procedure.  Return to your normal activities as told by your health care provider. This information is not intended to replace advice given to you by your health care provider. Make sure you discuss any questions you have with your health care provider. Document Released: 01/30/2012 Document Revised: 12/31/2017 Document Reviewed: 12/31/2017 Elsevier Interactive Patient Education  2019 [place], Care After These instructions provide you with information about caring for yourself after your procedure. Your health care provider may also give you more specific instructions. Your treatment has been planned according to current medical practices, but problems sometimes occur. Call your health care provider if you have any problems or questions after your procedure. What can I expect after the procedure? After your procedure, you may:  Feel sleepy for several hours.  Feel clumsy and have poor balance for several hours.  Feel forgetful about what happened after the procedure.  Have poor judgment for several hours.  Feel nauseous or vomit.  Have a sore throat if you had a breathing tube during the procedure. Follow these instructions at home: For at least 24 hours after the procedure:      Have a responsible adult stay with you. It is important to have someone help care for you until you are awake and alert.  Rest as needed.  Do not: ? Participate in activities in which you could fall or become injured. ? Drive. ? Use  heavy machinery. ? Drink alcohol. ? Take sleeping pills or medicines that cause drowsiness. ? Make important decisions or sign legal documents. ? Take care of children on your own. Eating and drinking  Follow the diet that is recommended by your health care provider.  If you vomit, drink water, juice, or soup when you can drink without vomiting.  Make sure you have little or no nausea before eating solid foods. General instructions  Take over-the-counter and prescription medicines only as told by your health care provider.  If you have sleep apnea, surgery and certain medicines can increase your risk for breathing problems. Follow instructions from your health care provider about wearing your sleep device: ? Anytime you are sleeping, including during daytime naps. ? While taking prescription pain medicines, sleeping medicines, or medicines that make you drowsy.  If you smoke, do not smoke without supervision.  Keep all follow-up visits as told by your health care provider. This is important. Contact a health care provider if:  You keep feeling nauseous or you keep vomiting.  You feel light-headed.  You develop a rash.  You have a fever. Get help right away if:  You have trouble breathing. Summary  For several hours after your procedure, you may feel sleepy and have poor judgment.  Have a responsible adult stay with you for at least 24 hours or until you are awake and alert. This information is not intended to replace advice given to you by your health care provider. Make sure you discuss any questions you have with your health care provider. Document Released: 11/21/2015 Document Revised: 03/16/2017 Document Reviewed: 11/21/2015 Elsevier Interactive Patient Education  2019 [place].    No aspirin or NSAIDs for 24 hours. Discontinue Dexlansoprazole. Begin Rabeprazole at 20 mg by mouth 30 minutes before breakfast and evening meal daily. Resume other medications as  before. Resume usual diet. No driving for 24 hours. Physician will call with biopsy results. Office visit in 3 months.

## 2018-08-13 ENCOUNTER — Encounter (INDEPENDENT_AMBULATORY_CARE_PROVIDER_SITE_OTHER): Payer: Self-pay

## 2018-08-13 DIAGNOSIS — H16222 Keratoconjunctivitis sicca, not specified as Sjogren's, left eye: Secondary | ICD-10-CM | POA: Diagnosis not present

## 2018-08-13 DIAGNOSIS — H5203 Hypermetropia, bilateral: Secondary | ICD-10-CM | POA: Diagnosis not present

## 2018-08-13 DIAGNOSIS — H25093 Other age-related incipient cataract, bilateral: Secondary | ICD-10-CM | POA: Diagnosis not present

## 2018-08-13 DIAGNOSIS — H16223 Keratoconjunctivitis sicca, not specified as Sjogren's, bilateral: Secondary | ICD-10-CM | POA: Diagnosis not present

## 2018-08-15 ENCOUNTER — Other Ambulatory Visit (INDEPENDENT_AMBULATORY_CARE_PROVIDER_SITE_OTHER): Payer: Self-pay | Admitting: Internal Medicine

## 2018-08-15 ENCOUNTER — Other Ambulatory Visit: Payer: Self-pay | Admitting: Family Medicine

## 2018-08-15 ENCOUNTER — Encounter: Payer: Self-pay | Admitting: Family Medicine

## 2018-08-15 DIAGNOSIS — R5383 Other fatigue: Secondary | ICD-10-CM

## 2018-08-15 MED ORDER — RABEPRAZOLE SODIUM 20 MG PO TBEC: 20.0000 mg | DELAYED_RELEASE_TABLET | Freq: Every day | ORAL | 5 refills | Status: DC

## 2018-08-15 MED FILL — RABEPRAZOLE SOD DR 20 MG TA: 20 | 30 days supply | Qty: 30 | Fill #0

## 2018-08-16 ENCOUNTER — Ambulatory Visit (INDEPENDENT_AMBULATORY_CARE_PROVIDER_SITE_OTHER): Payer: No Typology Code available for payment source | Admitting: Family Medicine

## 2018-08-16 ENCOUNTER — Encounter: Payer: Self-pay | Admitting: Family Medicine

## 2018-08-16 VITALS — Temp 97.7°F | Wt 199.4 lb

## 2018-08-16 DIAGNOSIS — J029 Acute pharyngitis, unspecified: Secondary | ICD-10-CM

## 2018-08-16 LAB — POCT RAPID STREP A (OFFICE): Rapid Strep A Screen: NEGATIVE

## 2018-08-16 NOTE — Telephone Encounter (Signed)
Contacted patient; pt was transferred up front to get appt

## 2018-08-16 NOTE — Progress Notes (Signed)
   Subjective:    Patient ID: Theresa [name], female    DOB: Mar 31, 1971, 48 y.o.   MRN: [id number]  Sore Throat   This is a new problem. The current episode started in the past 7 days. The maximum temperature recorded prior to her arrival was 101 - 101.9 F. Associated symptoms include congestion, headaches and a hoarse voice. Pertinent negatives include no coughing, diarrhea, ear pain, shortness of breath or vomiting. Associated symptoms comments: Glands swollen, body aches. Treatments tried: ibuprofen, throat lozenger, otc cold/flu med. The treatment provided mild relief.   Yesterday started with sore throat, h/a, body aches, swollen glands, fever this morning. Has tried ibuprofen and cold/flu med. Reports dry cough on 12/31, but none since.    Review of Systems  Constitutional: Positive for fever.  HENT: Positive for congestion, hoarse voice and sore throat. Negative for ear pain, rhinorrhea, sinus pressure and sinus pain.   Respiratory: Negative for cough, shortness of breath and wheezing.   Gastrointestinal: Negative for diarrhea, nausea and vomiting.  Neurological: Positive for headaches.       Objective:   Physical Exam Vitals signs and nursing note reviewed.  Constitutional:      General: She is not in acute distress.    Appearance: She is well-developed. She is not toxic-appearing.  HENT:     Head: Normocephalic and atraumatic.     Right Ear: Tympanic membrane normal.     Left Ear: Tympanic membrane normal.     Nose: Nose normal.     Mouth/Throat:     Mouth: Mucous membranes are moist.     Pharynx: Oropharyngeal exudate and posterior oropharyngeal erythema present.  Eyes:     General:        Right eye: No discharge.        Left eye: No discharge.  Neck:     Musculoskeletal: Neck supple. Muscular tenderness present. No neck rigidity.  Cardiovascular:     Rate and Rhythm: Normal rate and regular rhythm.     Heart sounds: Normal heart sounds.  Pulmonary:     Effort:  Pulmonary effort is normal. No respiratory distress.     Breath sounds: Normal breath sounds.  Lymphadenopathy:     Cervical: No cervical adenopathy.  Skin:    General: Skin is warm and dry.  Neurological:     Mental Status: She is alert and oriented to person, place, and time.  Psychiatric:        Mood and Affect: Mood normal.    Results for orders placed or performed in visit on 08/16/18  POCT rapid strep A  Result Value Ref Range   Rapid Strep A Screen Negative Negative          Assessment & Plan:  Viral pharyngitis  Sore throat - Plan: POCT rapid strep A  Rapid strep negative, will send back up. Discussed likely viral etiology. Symptomatic care discussed, warning signs discussed. F/u if symptoms worsen or fail to improve.

## 2018-08-17 ENCOUNTER — Telehealth: Payer: No Typology Code available for payment source | Admitting: Family

## 2018-08-17 DIAGNOSIS — J069 Acute upper respiratory infection, unspecified: Secondary | ICD-10-CM | POA: Diagnosis not present

## 2018-08-17 LAB — STREP A DNA PROBE: Strep Gp A Direct, DNA Probe: NEGATIVE

## 2018-08-17 MED ORDER — FLUTICASONE PROPIONATE 50 MCG/ACT NA SUSP: 2.0000 | Freq: Every day | NASAL | 0 refills | Status: DC

## 2018-08-17 MED ORDER — BENZONATATE 100 MG PO CAPS: 100.0000 mg | ORAL_CAPSULE | Freq: Three times a day (TID) | ORAL | 0 refills | Status: DC | PRN

## 2018-08-17 NOTE — Progress Notes (Signed)
We are sorry you are not feeling well.  Here is how we plan to help!  Based on what you have shared with me, it looks like you may have a viral upper respiratory infection or a "common cold".  Colds are caused by a large number of viruses; however, rhinovirus is the most common cause.   Symptoms of the common cold vary from person to person, with common symptoms including sore throat, cough, and malaise.  A low-grade fever of 100.4 may present, but is often uncommon.  Symptoms vary however, and are closely related to a person's age or underlying illnesses.  The most common symptoms associated with the common cold are nasal discharge or congestion, cough, sneezing, headache and pressure in the ears and face.  Cold symptoms usually persist for about 3 to 10 days, but can last up to 2 weeks.  It is important to know that colds do not cause serious illness or complications in most cases.    The common cold is transmitted from person to person, with the most common method of transmission being a person's hands.  The virus is able to live on the skin and can infect other persons for up to 2 hours after direct contact.  Also, colds are transmitted when someone coughs or sneezes; thus, it is important to cover the mouth to reduce this risk.  To keep the spread of the common cold at [place], good hand hygiene is very important.  This is an infection that is most likely caused by a virus. There are no specific treatments for the common cold other than to help you with the symptoms until the infection runs its course.    For nasal congestion, you may use an oral decongestants such as Mucinex D or if you have glaucoma or high blood pressure use plain Mucinex.  Saline nasal spray or nasal drops can help and can safely be used as often as needed for congestion.  For your congestion, I have prescribed Fluticasone nasal spray one spray in each nostril twice a day  If you do not have a history of heart disease, hypertension,  diabetes or thyroid disease, prostate/bladder issues or glaucoma, you may also use Sudafed to treat nasal congestion.  It is highly recommended that you consult with a pharmacist or your primary care physician to ensure this medication is safe for you to take.     If you have a cough, you may use cough suppressants such as Delsym and Robitussin.  If you have glaucoma or high blood pressure, you can also use Coricidin HBP.   For cough I have prescribed for you A prescription cough medication called Tessalon Perles 100 mg. You may take 1-2 capsules every 8 hours as needed for cough  If you have a sore or scratchy throat, use a saltwater gargle-  to  teaspoon of salt dissolved in a 4-ounce to 8-ounce glass of warm water.  Gargle the solution for approximately 15-30 seconds and then spit.  It is important not to swallow the solution.  You can also use throat lozenges/cough drops and Chloraseptic spray to help with throat pain or discomfort.  Warm or cold liquids can also be helpful in relieving throat pain.  For headache, pain or general discomfort, you can use Ibuprofen or Tylenol as directed.   Some authorities believe that zinc sprays or the use of Echinacea may shorten the course of your symptoms.   HOME CARE . Only take medications as instructed by your  medical team. . Be sure to drink plenty of fluids. Water is fine as well as fruit juices, sodas and electrolyte beverages. You may want to stay away from caffeine or alcohol. If you are nauseated, try taking small sips of liquids. How do you know if you are getting enough fluid? Your urine should be a pale yellow or almost colorless. . Get rest. . Taking a steamy shower or using a humidifier may help nasal congestion and ease sore throat pain. You can place a towel over your head and breathe in the steam from hot water coming from a faucet. . Using a saline nasal spray works much the same way. . Cough drops, hard candies and sore throat lozenges  may ease your cough. . Avoid close contacts especially the very young and the elderly . Cover your mouth if you cough or sneeze . Always remember to wash your hands.   GET HELP RIGHT AWAY IF: . You develop worsening fever. . If your symptoms do not improve within 10 days . You develop yellow or green discharge from your nose over 3 days. . You have coughing fits . You develop a severe head ache or visual changes. . You develop shortness of breath, difficulty breathing or start having chest pain . Your symptoms persist after you have completed your treatment plan  MAKE SURE YOU   Understand these instructions.  Will watch your condition.  Will get help right away if you are not doing well or get worse.  Your e-visit answers were reviewed by a board certified advanced clinical practitioner to complete your personal care plan. Depending upon the condition, your plan could have included both over the counter or prescription medications. Please review your pharmacy choice. If there is a problem, you may call our nursing hot line at and have the prescription routed to another pharmacy. Your safety is important to [place]. If you have drug allergies check your prescription carefully.   You can use MyChart to ask questions about today's visit, request a non-urgent call back, or ask for a work or school excuse for 24 hours related to this e-Visit. If it has been greater than 24 hours you will need to follow up with your provider, or enter a new e-Visit to address those concerns. You will get an e-mail in the next two days asking about your experience.  I hope that your e-visit has been valuable and will speed your recovery. Thank you for using e-visits.

## 2018-08-19 ENCOUNTER — Encounter (HOSPITAL_COMMUNITY): Payer: Self-pay | Admitting: Internal Medicine

## 2018-08-27 LAB — SEDIMENTATION RATE: SED RATE: 17 mm/h (ref 0–32)

## 2018-08-27 LAB — ANTINUCLEAR ANTIBODIES, IFA: ANA Titer 1: NEGATIVE

## 2018-09-04 ENCOUNTER — Other Ambulatory Visit: Payer: Self-pay | Admitting: Family Medicine

## 2018-09-04 MED FILL — CITALOPRAM HBR 40 MG TABLET: 40 | 90 days supply | Qty: 90 | Fill #0

## 2018-09-05 ENCOUNTER — Encounter (INDEPENDENT_AMBULATORY_CARE_PROVIDER_SITE_OTHER): Payer: Self-pay

## 2018-09-05 MED FILL — RESTASIS 0.05% EYE EMULSION: 0.05 | 90 days supply | Qty: 180 | Fill #0

## 2018-09-06 ENCOUNTER — Telehealth (INDEPENDENT_AMBULATORY_CARE_PROVIDER_SITE_OTHER): Payer: Self-pay | Admitting: Internal Medicine

## 2018-09-06 DIAGNOSIS — K219 Gastro-esophageal reflux disease without esophagitis: Secondary | ICD-10-CM

## 2018-09-06 MED ORDER — RABEPRAZOLE SODIUM 20 MG PO TBEC: 20.0000 mg | DELAYED_RELEASE_TABLET | Freq: Every day | ORAL | 3 refills | Status: DC

## 2018-09-06 MED FILL — RABEPRAZOLE SOD DR 20 MG TA: 20 | 90 days supply | Qty: 90 | Fill #0

## 2018-09-06 NOTE — Telephone Encounter (Signed)
Rx for Aciphex sent to her pharmacy

## 2018-09-16 MED FILL — PODOFILOX 0.5% TOPICAL SOLN: 0.5 | 28 days supply | Qty: 4 | Fill #0

## 2018-09-22 ENCOUNTER — Encounter: Payer: Self-pay | Admitting: Physician Assistant

## 2018-09-22 ENCOUNTER — Telehealth: Payer: No Typology Code available for payment source | Admitting: Physician Assistant

## 2018-09-22 DIAGNOSIS — J209 Acute bronchitis, unspecified: Secondary | ICD-10-CM

## 2018-09-22 MED ORDER — DOXYCYCLINE HYCLATE 100 MG PO CAPS: 100.0000 mg | ORAL_CAPSULE | Freq: Two times a day (BID) | ORAL | 0 refills | Status: DC

## 2018-09-22 MED ORDER — BENZONATATE 100 MG PO CAPS: 100.0000 mg | ORAL_CAPSULE | Freq: Three times a day (TID) | ORAL | 0 refills | Status: DC | PRN

## 2018-09-22 NOTE — Progress Notes (Signed)

## 2018-10-15 ENCOUNTER — Other Ambulatory Visit: Payer: Self-pay | Admitting: Family

## 2018-10-15 ENCOUNTER — Other Ambulatory Visit: Payer: Self-pay | Admitting: Family Medicine

## 2018-10-16 ENCOUNTER — Encounter: Payer: Self-pay | Admitting: Family Medicine

## 2018-10-16 MED ORDER — FLUTICASONE PROPIONATE 50 MCG/ACT NA SUSP: 2.0000 | Freq: Every day | NASAL | 11 refills | Status: DC

## 2018-10-16 MED FILL — FLUTICASONE PROP 50 MCG SPR: 50 | 30 days supply | Qty: 16 | Fill #0

## 2018-10-16 NOTE — Telephone Encounter (Signed)
Three mo worth 

## 2018-10-22 ENCOUNTER — Other Ambulatory Visit: Payer: Self-pay | Admitting: Family

## 2018-10-26 ENCOUNTER — Telehealth: Payer: No Typology Code available for payment source | Admitting: Nurse Practitioner

## 2018-10-26 DIAGNOSIS — Z20818 Contact with and (suspected) exposure to other bacterial communicable diseases: Secondary | ICD-10-CM

## 2018-10-26 MED ORDER — AMOXICILLIN-POT CLAVULANATE 875-125 MG PO TABS: 1.0000 | ORAL_TABLET | Freq: Two times a day (BID) | ORAL | 0 refills | Status: DC

## 2018-10-26 NOTE — Progress Notes (Signed)

## 2018-11-09 ENCOUNTER — Other Ambulatory Visit: Payer: Self-pay | Admitting: Family Medicine

## 2018-11-09 MED FILL — RABEPRAZOLE SOD DR 20 MG TA: 20 | 90 days supply | Qty: 90 | Fill #0

## 2018-11-11 MED FILL — CITALOPRAM HBR 40 MG TABLET: 40 | 90 days supply | Qty: 90 | Fill #0

## 2018-11-19 MED FILL — ALPRAZolam 1 MG TABS: 1 | 30 days supply | Qty: 90 | Fill #0

## 2018-12-20 MED FILL — ALPRAZolam 1 MG TABS: 1 | 30 days supply | Qty: 90 | Fill #1

## 2019-01-21 ENCOUNTER — Telehealth (INDEPENDENT_AMBULATORY_CARE_PROVIDER_SITE_OTHER): Payer: Self-pay | Admitting: Internal Medicine

## 2019-01-21 DIAGNOSIS — W57XXXS Bitten or stung by nonvenomous insect and other nonvenomous arthropods, sequela: Secondary | ICD-10-CM

## 2019-01-21 NOTE — Telephone Encounter (Signed)
Patient informed me earlier today that she has been having multiple symptoms including abdominal pain and nausea every time she eats red meat.  She has history of tick bite and is concerned that she may have alpha gal We will proceed with testing for antibodies

## 2019-01-23 ENCOUNTER — Other Ambulatory Visit (HOSPITAL_COMMUNITY)
Admission: RE | Admit: 2019-01-23 | Discharge: 2019-01-23 | Disposition: A | Discharge status: Home / Self Care | Payer: No Typology Code available for payment source | Source: Ambulatory Visit | Attending: Internal Medicine | Admitting: Internal Medicine

## 2019-01-23 DIAGNOSIS — S70362A Insect bite (nonvenomous), left thigh, initial encounter: Secondary | ICD-10-CM | POA: Diagnosis not present

## 2019-01-23 DIAGNOSIS — R110 Nausea: Secondary | ICD-10-CM | POA: Diagnosis present

## 2019-01-23 DIAGNOSIS — R1084 Generalized abdominal pain: Secondary | ICD-10-CM | POA: Insufficient documentation

## 2019-01-23 DIAGNOSIS — R069 Unspecified abnormalities of breathing: Secondary | ICD-10-CM | POA: Insufficient documentation

## 2019-01-28 ENCOUNTER — Other Ambulatory Visit: Payer: Self-pay | Admitting: *Deleted

## 2019-01-28 MED ORDER — ALPRAZOLAM 1 MG PO TABS: ORAL_TABLET | ORAL | 0 refills | Status: DC

## 2019-01-28 NOTE — Telephone Encounter (Signed)
Ok times one, write needs ov efore further on rx

## 2019-01-29 ENCOUNTER — Other Ambulatory Visit (INDEPENDENT_AMBULATORY_CARE_PROVIDER_SITE_OTHER): Payer: Self-pay | Admitting: Internal Medicine

## 2019-01-29 LAB — MISC LABCORP TEST (SEND OUT): Labcorp test code: 807003

## 2019-01-29 MED ORDER — EPINEPHRINE 0.15 MG/0.3ML IJ SOAJ: 0.1500 mg | INTRAMUSCULAR | 1 refills | Status: DC | PRN

## 2019-01-29 MED FILL — ALPRAZolam 1 MG TABS: 1 | 30 days supply | Qty: 90 | Fill #0

## 2019-01-30 ENCOUNTER — Other Ambulatory Visit (INDEPENDENT_AMBULATORY_CARE_PROVIDER_SITE_OTHER): Payer: Self-pay | Admitting: Internal Medicine

## 2019-01-30 ENCOUNTER — Encounter: Payer: Self-pay | Admitting: Family Medicine

## 2019-01-30 MED ORDER — EPINEPHRINE 0.3 MG/0.3ML IJ SOAJ: 0.3000 mg | INTRAMUSCULAR | 1 refills | Status: DC | PRN

## 2019-01-31 ENCOUNTER — Encounter: Payer: Self-pay | Admitting: Family Medicine

## 2019-01-31 ENCOUNTER — Other Ambulatory Visit (INDEPENDENT_AMBULATORY_CARE_PROVIDER_SITE_OTHER): Payer: Self-pay | Admitting: Internal Medicine

## 2019-01-31 ENCOUNTER — Other Ambulatory Visit: Payer: Self-pay | Admitting: Family Medicine

## 2019-01-31 ENCOUNTER — Other Ambulatory Visit: Payer: Self-pay | Admitting: Physician Assistant

## 2019-01-31 DIAGNOSIS — R894 Abnormal immunological findings in specimens from other organs, systems and tissues: Secondary | ICD-10-CM

## 2019-01-31 DIAGNOSIS — B2700 Gammaherpesviral mononucleosis without complication: Secondary | ICD-10-CM

## 2019-01-31 MED ORDER — SUCRALFATE 1 GM/10ML PO SUSP: 1.0000 g | Freq: Four times a day (QID) | ORAL | 1 refills | Status: DC

## 2019-01-31 NOTE — Telephone Encounter (Signed)
Patient called stating she was having GERD symptoms not controlled with PPI. She has responded to sucralfate in the past. Therefore prescription for sucralfate sent to patient's pharmacy.

## 2019-02-01 MED FILL — RABEPRAZOLE SOD DR 20 MG TA: 20 | 90 days supply | Qty: 90 | Fill #1

## 2019-02-03 ENCOUNTER — Other Ambulatory Visit: Payer: Self-pay | Admitting: Family Medicine

## 2019-02-04 MED FILL — CITALOPRAM HBR 40 MG TABLET: 40 | 90 days supply | Qty: 90 | Fill #0

## 2019-02-10 ENCOUNTER — Ambulatory Visit: Payer: No Typology Code available for payment source | Admitting: Orthopedic Surgery

## 2019-02-10 ENCOUNTER — Encounter: Payer: Self-pay | Admitting: Family Medicine

## 2019-02-10 ENCOUNTER — Encounter: Payer: Self-pay | Admitting: Orthopedic Surgery

## 2019-02-10 ENCOUNTER — Other Ambulatory Visit: Payer: Self-pay

## 2019-02-10 ENCOUNTER — Ambulatory Visit (INDEPENDENT_AMBULATORY_CARE_PROVIDER_SITE_OTHER): Payer: No Typology Code available for payment source

## 2019-02-10 VITALS — Temp 98.2°F | Ht 66.0 in | Wt 187.0 lb

## 2019-02-10 DIAGNOSIS — M25511 Pain in right shoulder: Secondary | ICD-10-CM

## 2019-02-10 DIAGNOSIS — S060X9A Concussion with loss of consciousness of unspecified duration, initial encounter: Secondary | ICD-10-CM | POA: Diagnosis not present

## 2019-02-10 DIAGNOSIS — G8929 Other chronic pain: Secondary | ICD-10-CM | POA: Diagnosis not present

## 2019-02-10 DIAGNOSIS — S43101A Unspecified dislocation of right acromioclavicular joint, initial encounter: Secondary | ICD-10-CM

## 2019-02-10 NOTE — Patient Instructions (Addendum)
Work note 7 days  Concussion, Adult  A concussion is a brain injury from a hard, direct hit (trauma) to the head or body. This direct hit causes the brain to shake quickly back and forth inside the skull. This can damage brain cells and cause chemical changes in the brain. A concussion may also be known as a mild traumatic brain injury (TBI). Concussions are usually not life-threatening, but the effects of a concussion can be serious. If you have a concussion, you should be very careful to avoid having a second concussion. What are the causes? This condition is caused by:  A direct hit to your head, such as: ? Running into another player during a game. ? Being hit in a fight. ? Hitting your head on a hard surface.  Sudden movement of your body that causes your brain to move back and forth inside the skull, such as in a car crash. What are the signs or symptoms? The signs of a concussion can be hard to notice. Early on, they may be missed by you, family members, and health care providers. You may look fine on the outside but may act or feel differently. Symptoms are usually temporary and most often improve in 7-10 days. Some symptoms appear right away, but other symptoms may not show up for hours or days. If your symptoms last longer than normal, you may have post-concussion syndrome. Every head injury is different. Physical symptoms  Headaches. This can include a feeling of pressure in the head or migraine-like symptoms.  Tiredness (fatigue).  Dizziness.  Problems with coordination or balance.  Vision or hearing problems.  Sensitivity to light or noise.  Nausea or vomiting.  Changes in eating or sleeping patterns.  Numbness or tingling.  Seizure. Mental and emotional symptoms  Memory problems.  Trouble concentrating, organizing, or making decisions.  Slowness in thinking, acting or reacting, speaking, or reading.  Irritability or mood changes.  Anxiety or depression.  How is this diagnosed? This condition is diagnosed based on:  Your symptoms.  A description of your injury. You may also have tests, including:  Imaging tests, such as a CT scan or MRI.  Neuropsychological tests. These measure your thinking, understanding, learning, and remembering abilities. How is this treated? Treatment for this condition includes:  Stopping sports or activity if you are injured. If you hit your head or show signs of concussion: ? Do not return to sports or activities the same day. ? Get checked by a health care provider before you return to your activities.  Physical and mental rest and careful observation, usually at home. Gradually return to your normal activities.  Medicines to help with symptoms such as headaches, nausea, or difficulty sleeping. ? Avoid taking opioid pain medicine while recovering from a concussion.  Avoiding alcohol and drugs. These may slow your recovery and can put you at risk of further injury.  Referral to a concussion clinic or rehabilitation center. Recovery from a concussion can take time. How fast you recover depends on many factors. Return to activities only when:  Your symptoms are completely gone.  Your health care provider says that it is safe. Follow these instructions at home: Activity  Limit activities that require a lot of thought or concentration, such as: ? Doing homework or job-related work. ? Watching TV. ? Working on the computer or phone. ? Playing memory games and puzzles.  Rest. Rest helps your brain heal. Make sure you: ? Get plenty of sleep. Most adults should  get 7-9 hours of sleep each night. ? Rest during the day. Take naps or rest breaks when you feel tired.  Avoid physical activity like exercise until your health care provider says it is safe. Stop any activity that worsens symptoms.  Do not do high-risk activities that could cause a second concussion, such as riding a bike or playing sports.   Ask your health care provider when you can return to your normal activities, such as school, work, athletics, and driving. Your ability to react may be slower after a brain injury. Never do these activities if you are dizzy. Your health care provider will likely give you a plan for gradually returning to activities. General instructions   Take over-the-counter and prescription medicines only as told by your health care provider. Some medicines, such as blood thinners (anticoagulants) and aspirin, may increase the risk for complications, such as bleeding.  Do not drink alcohol until your health care provider says you can.  Watch your symptoms and tell others around you to do the same. Complications sometimes occur after a concussion. Older adults with a brain injury may have a higher risk of serious complications.  Tell your work Freight forwarder, teachers, Government social research officer, school counselor, coach, or Product/process development scientist about your injury, symptoms, and restrictions.  Keep all follow-up visits as told by your health care provider. This is important. How is this prevented? Avoiding another brain injury is very important. In rare cases, another injury can lead to permanent brain damage, brain swelling, or death. The risk of this is greatest during the first 7-10 days after a head injury. Avoid injuries by:  Stopping activities that could lead to a second concussion, such as contact or recreational sports, until your health care provider says it is okay.  Taking these actions once you have returned to sports or activities: ? Avoiding plays or moves that can cause you to crash into another person. This is how most concussions occur. ? Following the rules and being respectful of other players. Do not engage in violent or illegal plays.  Getting regular exercise that includes strength and balance training.  Wearing a properly fitting helmet during sports, biking, or other activities. Helmets can help protect you  from serious skull and brain injuries, but they do not protect you from a concussion. Even when wearing a helmet, you should avoid being hit in the head. Contact a health care provider if:  Your symptoms get worse or they do not improve.  You have new symptoms.  You have another injury. Get help right away if:  You have severe or worsening headaches.  You have weakness or numbness in any part of your body.  You are confused.  Your coordination gets worse.  You vomit repeatedly.  You are sleepier than normal.  Your speech is slurred.  You cannot recognize people or places.  You have a seizure.  It is difficult to wake you up.  You have unusual behavior changes.  You have changes in your vision.  You lose consciousness. Summary  A concussion is a brain injury that results from a hard, direct hit (trauma) to your head or body.  You may have imaging tests and neuropsychological tests to diagnose a concussion.  Treatment for this condition includes physical and mental rest and careful observation.  Ask your health care provider when you can return to your normal activities, such as school, work, athletics, and driving.  Get help right away if you have a severe headache, weakness  on one side of the body, seizures, behavior changes, changes in vision, or if you are confused or sleepier than normal. This information is not intended to replace advice given to you by your health care provider. Make sure you discuss any questions you have with your health care provider. Document Released: 10/21/2003 Document Revised: 03/21/2018 Document Reviewed: 03/21/2018 Elsevier Patient Education  2020 [employer].  Acromioclavicular Separation  A shoulder separation (acromioclavicular separation) is an injury to the tissues that connect bones to each other (ligaments) between the top of your shoulder blade (acromion) and your collarbone (clavicle). The ligaments may be stretched,  partially torn, or completely torn.  A stretched ligament may not cause much pain, and it does not move the collarbone out of place. A stretched ligament looks normal on an X-ray.  A partial tear causes an injury that is a bit worse, and it may move the collarbone slightly out of place.  A complete tear causes serious injury. The surrounding shoulder ligaments are completely torn. This moves the collarbone out of position and creates a bad shape (deformity) of the shoulder. What are the causes? Common causes of this condition include:  Falling on the shoulder.  Receiving a hard, direct hit (blow) to the top of the shoulder.  Falling on an outstretched arm. What increases the risk? You may be at greater risk of a shoulder separation if you:  Are female.  Are younger than 24.  Play a contact sport, such as football or hockey. What are the signs or symptoms? The most common symptom of a shoulder separation is pain on the top of the shoulder after falling on it or receiving a blow to it. Other signs and symptoms include:  Shoulder deformity.  Swelling of the shoulder.  Decreased ability to move the shoulder.  Bruising on top of the shoulder. How is this diagnosed? Your health care provider may suspect a shoulder separation based on your symptoms and the details of a recent injury you experienced. The condition will be diagnosed based on:  A physical exam. Your provider may: ? Press on your shoulder. ? Test the movement of your shoulder. ? Ask you to hold a weight in your hand to see if the separation increases.  Imaging tests, such as: ? X-rays. ? MRI. How is this treated? Treatment for this condition depends on the cause and severity of the injury.  A shoulder separation caused by a stretched ligament may require 2-12 weeks of the following: ? Wearing a sling. ? Taking medicines to help relieve pain. ? Applying cold packs to your shoulder. ? Physical therapy. If needed,  a physical therapist will teach you to do daily exercises to strengthen your shoulder muscles and prevent stiffness.  Surgery may be needed for severe injuries that include breaks (fractures) in a bone, or injuries that do not get better with nonsurgical treatments. To help with healing, you will need to keep your joint in place for a period of time (immobilization) and do physical therapy. Follow these instructions at home: Medicines  Take over-the-counter and prescription medicines only as told by your health care provider.  Do not drive or use heavy machinery while taking prescription pain medicine.  If you are taking prescription pain medicine, take actions to prevent or treat constipation. Your health care provider may recommend that you: ? Drink enough fluid to keep your urine pale yellow. ? Eat foods that are high in fiber, such as fresh fruits and vegetables, whole grains, and  beans. ? Limit foods that are high in fat and processed sugars, such as fried or sweet foods. ? Take an over-the-counter or prescription medicine for constipation. If you have a sling:  Wear your sling as told by your health care provider. Remove it only as told by your health care provider.  Loosen the sling if your fingers tingle, become numb, or turn cold and blue.  Keep the sling clean.  If the sling is not waterproof: ? Do not let it get wet. ? Cover it with a watertight covering when you take a bath or a shower. Managing pain, stiffness, and swelling   If directed, apply ice to the top of your shoulder: ? Put ice in a plastic bag. ? Place a towel between your skin and the bag. ? Leave the ice on for 20 minutes, 2-3 times a day.  Do not do any activities that make your pain worse. Activity  Do not lift anything that is heavier than 10 lb (4.5 kg), or the limit that you were told, until your health care provider says that it is safe.  Rest your shoulder. Avoid activities that take a lot of  effort (strenuous activities) for as long as told by your health care provider.  Return to your normal activities as told by your health care provider. Ask your health care provider what activities are safe for you.  Do range of motion exercises as told by your health care provider. General instructions  Do not use any products that contain nicotine or tobacco, such as cigarettes and e-cigarettes. These can delay healing. If you need help quitting, ask your health care provider.  Keep all follow-up visits as told by your health care provider. This is important. These include visits for physical therapy as directed by your health care provider. Contact a health care provider if:  Your pain medicine is not relieving your pain.  Your pain and stiffness are not improving after 2 weeks.  You are unable to do your physical therapy exercises because of pain or stiffness. Get help right away if:  Your arm on the injured side feels cold or numb.  Your skin or fingers on the arm on the injured side turn blue or gray. Summary  A shoulder separation (acromioclavicular separation) is an injury to the tissues that connect bones to each other (ligaments) between the top of your shoulder blade (acromion) and your collarbone (clavicle).  The ligaments may be stretched, partially torn, or completely torn.  The most common cause of a shoulder separation is falling on or receiving a blow to the top of the shoulder. Falling with an outstretched arm may also cause this injury.  Rest your shoulder. Avoid activities that take a lot of effort (strenuous activities) for as long as told by your health care provider. This information is not intended to replace advice given to you by your health care provider. Make sure you discuss any questions you have with your health care provider. Document Released: 05/10/2005 Document Revised: 09/15/2017 Document Reviewed: 09/15/2017 Elsevier Patient Education  2020  [employer].

## 2019-02-10 NOTE — Progress Notes (Signed)
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  She has a dull toothache-like sensation over the right AC joint with some decreased but not complete loss of motion she has pain reaching across her chest lying on her back and trying to sleep on her back.  She has taken some ibuprofen and used heat with modest relief   Review of Systems  Gastrointestinal: Positive for abdominal pain, constipation, diarrhea, heartburn, nausea and vomiting.  Musculoskeletal: Positive for joint pain.  Neurological: Positive for dizziness, loss of consciousness and headaches. Negative for tingling, tremors, sensory change, speech change, focal weakness and weakness.  Endo/Heme/Allergies: Positive for environmental allergies.     Past Medical History:  Diagnosis Date  . Allergy   . Anxiety   . Colon adenoma DEC 2014   ONE(7 MM) SIMPLE([place])  . Depression   . Edema   . GERD (gastroesophageal reflux disease)   . History of abnormal cervical Pap smear 12/02/2013  . History of nephrolithiasis   . HPV (human papilloma virus) infection   . Hyperlipidemia   . IBS (irritable bowel syndrome)    constipation  . Insomnia   . Obesity 01/14/2014  . Pre-diabetes 09/2014  . Yeast infection 09/04/2013    Past Surgical History:  Procedure Laterality Date  . BIOPSY N/A 08/01/2013   Procedure: BIOPSY;  Surgeon: Danie Binder, MD;  [facility];  Service: Endoscopy;  Laterality: N/A;  FOR MICROSCOPIC COLITIS  . BIOPSY  08/09/2018   Procedure: BIOPSY;  Surgeon: Rogene Houston, MD;  Location: [facility];  Service: Endoscopy;;  gastric  . BREAST REDUCTION SURGERY  2009  .  BREAST SURGERY  2007   breast reduction  . CHOLECYSTECTOMY N/A 11/04/2014   Procedure: LAPAROSCOPIC CHOLECYSTECTOMY;  Surgeon: Aviva Signs Md, MD;  Location: [facility];  Service: General;  Laterality: N/A;  . COLONOSCOPY  Oct 2009   Dr. Carlean Purl: int/ext hemorrhoids, ?mild proctitis but path benign, likely r/t irritation from bowel prep  . COLONOSCOPY N/A 08/01/2013   Procedure: COLONOSCOPY;  Surgeon: Danie Binder, MD;  [facility]: [facility];  Service: Endoscopy;  Laterality: N/A;  12:00  . COLONOSCOPY WITH PROPOFOL N/A 08/09/2018   Procedure: COLONOSCOPY WITH PROPOFOL;  Surgeon: Rogene Houston, MD;  [facility]: [facility];  Service: Endoscopy;  Laterality: N/A;  730  . ESOPHAGOGASTRODUODENOSCOPY  Dec 2009   Dr. Carlean Purl: reflux esophagitis, proximal gastric polyps, benign  . ESOPHAGOGASTRODUODENOSCOPY N/A 12/18/2014   Procedure: ESOPHAGOGASTRODUODENOSCOPY (EGD);  Surgeon: Rogene Houston, MD;  [facility];  Service: Endoscopy;  Laterality: N/A;  230  . ESOPHAGOGASTRODUODENOSCOPY (EGD) WITH PROPOFOL N/A 08/09/2018   Procedure: ESOPHAGOGASTRODUODENOSCOPY (EGD) WITH PROPOFOL;  Surgeon: Rogene Houston, MD;  [facility];  Service: Endoscopy;  Laterality: N/A;  . HEMORRHOID BANDING N/A 08/01/2013   Procedure: HEMORRHOID BANDING;  Surgeon: Danie Binder, MD;  [facility];  Service: Endoscopy;  Laterality: N/A;  internal hemorrhoid banding  . LIVER BIOPSY N/A 11/04/2014   Procedure: LIVER BIOPSY;  Surgeon: Aviva Signs Md, MD;  Location: [facility];  Service: General;  Laterality: N/A;  . POLYPECTOMY  2015   Dr.Fields  . POLYPECTOMY  08/09/2018  Procedure: POLYPECTOMY;  Surgeon: Rogene Houston, MD;  [facility];  Service: Endoscopy;;  colon  . REDUCTION MAMMAPLASTY Bilateral 2006  . UPPER GASTROINTESTINAL ENDOSCOPY  2006   Dr. Nita Sickle in [place]    Family History  Problem Relation Age of Onset  . Cancer Mother        head and neck  .  Hypertension Father   . Hyperlipidemia Father   . Pancreatic cancer Maternal Grandfather   . Colon cancer Neg Hx    Social History   Tobacco Use  . Smoking status: Never Smoker  . Smokeless tobacco: Never Used  . Tobacco comment: Never smoker  Substance Use Topics  . Alcohol use: Yes    Alcohol/week: 0.0 standard drinks    Comment: socially  . Drug use: No    Allergies  Allergen Reactions  . Phentermine Palpitations    Current Meds  Medication Sig  . ALPRAZolam (XANAX) 1 MG tablet TAKE 1/2 TO 1 TABLET BY MOUTH DURING THE DAY FOR SEVERE ANXIETY AND 1 TO 2 TABLETS BY MOUTH EVERY NIGHT AT BEDTIME FOR SLEEP  . citalopram (CELEXA) 40 MG tablet TAKE 1 TABLET (40 MG TOTAL) BY MOUTH DAILY  . cycloSPORINE (RESTASIS) 0.05 % ophthalmic emulsion Place 1 drop into both eyes 2 (two) times daily.  Marland Kitchen doxycycline (VIBRAMYCIN) 100 MG capsule Take 1 capsule (100 mg total) by mouth 2 (two) times daily.  Marland Kitchen EPINEPHrine 0.3 mg/0.3 mL IJ SOAJ injection Inject 0.3 mLs (0.3 mg total) into the muscle as needed for anaphylaxis.  Marland Kitchen ibuprofen (ADVIL,MOTRIN) 200 MG tablet Take 400 mg by mouth every 6 (six) hours as needed for headache or moderate pain.  Marland Kitchen levonorgestrel (MIRENA) 20 MCG/24HR IUD 1 each by Intrauterine route once.  . Melatonin 5 MG CAPS Take 10 mg by mouth at bedtime.   . Multiple Vitamins-Minerals (MULTIVITAMIN PO) Take 1 tablet by mouth daily.  . RABEprazole (ACIPHEX) 20 MG tablet Take 1 tablet (20 mg total) by mouth daily before breakfast.  . sucralfate (CARAFATE) 1 GM/10ML suspension Take 10 mLs (1 g total) by mouth 4 (four) times daily.    Temp 98.2 F (36.8 C)   Ht 5\' 6"  (1.676 m)   Wt 187 lb (84.8 kg)   BMI 30.18 kg/m   Physical Exam Vitals signs and nursing note reviewed.  Constitutional:      Appearance: Normal appearance.  Neurological:     Mental Status: She is alert and oriented to person, place, and time.  Psychiatric:        Mood and Affect: Mood normal.      Ortho Exam Left shoulder Normal alignment, no joint contractures, joint subluxations none, muscle tone normal neurovascular exam intact skin clean  Right shoulder Tenderness over the [facility] joint without deformity.  Painful range of motion of the shoulder which remains intact she has pain reaching across her chest normal muscle tone mild weakness related to pain neurovascular exam is intact skin is clean dry and intact without ecchymosis or bruising  Grip strength is normal bilaterally she has no pulse abnormalities or tingling or numbness in either hand or upper extremity     MEDICAL DECISION SECTION  Xrays were done at [facility] x-ray report 3 views of the right shoulder including axillary Pain right shoulder  No fracture dislocation is seen  Impression normal shoulder    Encounter Diagnoses  Name Primary?  . Chronic right shoulder pain Yes  . Separation of right acromioclavicular  joint, initial encounter-Type 1    . Concussion with loss of consciousness, initial encounter     PLAN: (Rx., injectx, surgery, frx, mri/ct) Out of work for 1 week pending reevaluation for concussion AC joint symptomatic treatment continue ibuprofen ice and heat Follow-up in a week   No orders of the defined types were placed in this encounter.   Arther Abbott, MD  02/10/2019 12:02 PM

## 2019-02-11 ENCOUNTER — Telehealth: Payer: Self-pay | Admitting: Orthopedic Surgery

## 2019-02-11 ENCOUNTER — Encounter (HOSPITAL_COMMUNITY): Payer: Self-pay | Admitting: Emergency Medicine

## 2019-02-11 ENCOUNTER — Other Ambulatory Visit: Payer: Self-pay

## 2019-02-11 ENCOUNTER — Emergency Department (HOSPITAL_COMMUNITY)
Admission: EM | Admit: 2019-02-11 | Discharge: 2019-02-12 | Disposition: A | Discharge status: Home / Self Care | Payer: No Typology Code available for payment source | Attending: Emergency Medicine | Admitting: Emergency Medicine

## 2019-02-11 DIAGNOSIS — W010XXD Fall on same level from slipping, tripping and stumbling without subsequent striking against object, subsequent encounter: Secondary | ICD-10-CM | POA: Diagnosis not present

## 2019-02-11 DIAGNOSIS — R109 Unspecified abdominal pain: Secondary | ICD-10-CM | POA: Diagnosis present

## 2019-02-11 DIAGNOSIS — T7840XA Allergy, unspecified, initial encounter: Secondary | ICD-10-CM | POA: Diagnosis not present

## 2019-02-11 DIAGNOSIS — S0990XD Unspecified injury of head, subsequent encounter: Secondary | ICD-10-CM | POA: Diagnosis not present

## 2019-02-11 HISTORY — DX: Allergy to other foods: Z91.018

## 2019-02-11 MED ORDER — METHYLPREDNISOLONE SODIUM SUCC 125 MG IJ SOLR
125.0000 mg | Freq: Once | INTRAMUSCULAR | Status: AC
  Administered 2019-02-11: 125 mg via INTRAVENOUS
  Filled 2019-02-11: qty 2

## 2019-02-11 MED ORDER — EPINEPHRINE 0.3 MG/0.3ML IJ SOAJ: 0.3000 mg | INTRAMUSCULAR | 0 refills | Status: DC | PRN

## 2019-02-11 MED ORDER — DIPHENHYDRAMINE HCL 50 MG/ML IJ SOLN
50.0000 mg | Freq: Once | INTRAMUSCULAR | Status: AC
  Administered 2019-02-11: 50 mg via INTRAVENOUS
  Filled 2019-02-11: qty 1

## 2019-02-11 MED ORDER — PREDNISONE 20 MG PO TABS: 40.0000 mg | ORAL_TABLET | Freq: Every day | ORAL | 0 refills | Status: AC

## 2019-02-11 MED ORDER — FAMOTIDINE IN NACL 20-0.9 MG/50ML-% IV SOLN
20.0000 mg | Freq: Once | INTRAVENOUS | Status: AC
  Administered 2019-02-11: 20 mg via INTRAVENOUS
  Filled 2019-02-11: qty 50

## 2019-02-11 NOTE — ED Notes (Signed)
ED Provider at bedside. 

## 2019-02-11 NOTE — ED Provider Notes (Signed)
Patient presents with allergic rxn Monitor till 1:30am Patient has been appropriate/stable    Ripley Fraise, MD 02/11/19 2348

## 2019-02-11 NOTE — ED Provider Notes (Signed)
Emergency Department Provider Note   I have reviewed the triage vital signs and the nursing notes.   HISTORY  Chief Complaint Allergic Reaction   HPI Theresa [name] is a 48 y.o. female with PMH of IBS and alpha-gal allergy presents to the emergency department for evaluation of abdominal cramping, flushed feeling, and nausea/vomiting.  Patient has had anaphylaxis type symptoms in the past with primarily GI presentation.  She was recently diagnosed with alpha gal allergy and provided an epinephrine autoinjector. She had eaten two [place] sand She did give herself the EpiPen with the above symptoms. She is not experiencing any shortness of breath, tongue swelling, throat tightness.  Patient given the EpiPen approximately 45 minutes prior to ED presentation.  She feels somewhat improved.    Patient also notes "foggy" feeling since head injury 3 days ago.  Patient states that [place] the [place] when she lost her footing and fell.  No loss of consciousness.  She is concerned she may have developed a concussion.  She saw her orthopedic surgeon yesterday was diagnosed with AC separation.  She discussed the symptoms and plan was made to pursue head imaging if symptoms persisted into next week.  She has a follow-up appointment scheduled.  No vision change, weakness/numbness, gait instability.  Past Medical History:  Diagnosis Date  . Allergy   . Allergy to alpha-gal   . Anxiety   . Colon adenoma DEC 2014   ONE(7 MM) SIMPLE([place])  . Depression   . Edema   . GERD (gastroesophageal reflux disease)   . History of abnormal cervical Pap smear 12/02/2013  . History of nephrolithiasis   . HPV (human papilloma virus) infection   . Hyperlipidemia   . IBS (irritable bowel syndrome)    constipation  . Insomnia   . Obesity 01/14/2014  . Pre-diabetes 09/2014  . Yeast infection 09/04/2013    Patient Active Problem List   Diagnosis Date Noted  . Hx of colonic polyps 07/17/2018  .  Gastroesophageal reflux disease 07/17/2018  . Positive test for Epstein-Barr virus (EBV) 08/21/2017  . Anxiety and depression 07/28/2017  . Fatigue 07/12/2015  . Arthralgia 07/12/2015  . AP (abdominal pain) 07/06/2015  . Obesity 01/14/2014  . History of abnormal cervical Pap smear 12/02/2013  . Internal hemorrhoids with other complication 74/16/3845  . Generalized anxiety disorder 12/25/2012  . GERD (gastroesophageal reflux disease) 10/31/2011  . GASTROESOPHAGEAL REFLUX DISEASE 07/13/2008  . HEARTBURN 07/13/2008  . CONSTIPATION 05/13/2008  . BLOOD IN STOOL 05/13/2008  . CHANGE IN BOWELS 05/13/2008    Past Surgical History:  Procedure Laterality Date  . BIOPSY N/A 08/01/2013   Procedure: BIOPSY;  Surgeon: Danie Binder, MD;  [facility];  Service: Endoscopy;  Laterality: N/A;  FOR MICROSCOPIC COLITIS  . BIOPSY  08/09/2018   Procedure: BIOPSY;  Surgeon: Rogene Houston, MD;  [facility];  Service: Endoscopy;;  gastric  . BREAST REDUCTION SURGERY  2009  . BREAST SURGERY  2007   breast reduction  . CHOLECYSTECTOMY N/A 11/04/2014   Procedure: LAPAROSCOPIC CHOLECYSTECTOMY;  Surgeon: Aviva Signs Md, MD;  Location: [facility];  Service: General;  Laterality: N/A;  . COLONOSCOPY  Oct 2009   Dr. Carlean Purl: int/ext hemorrhoids, ?mild proctitis but path benign, likely r/t irritation from bowel prep  . COLONOSCOPY N/A 08/01/2013   Procedure: COLONOSCOPY;  Surgeon: Danie Binder, MD;  [facility];  Service: Endoscopy;  Laterality: N/A;  12:00  .  COLONOSCOPY WITH PROPOFOL N/A 08/09/2018   Procedure: COLONOSCOPY WITH PROPOFOL;  Surgeon: Rogene Houston, MD;  [facility];  Service: Endoscopy;  Laterality: N/A;  730  . ESOPHAGOGASTRODUODENOSCOPY  Dec 2009   Dr. Carlean Purl: reflux esophagitis, proximal gastric polyps, benign  . ESOPHAGOGASTRODUODENOSCOPY N/A 12/18/2014   Procedure: ESOPHAGOGASTRODUODENOSCOPY (EGD);  Surgeon: Rogene Houston, MD;  [facility];  Service: Endoscopy;  Laterality: N/A;  230  . ESOPHAGOGASTRODUODENOSCOPY (EGD) WITH PROPOFOL N/A 08/09/2018   Procedure: ESOPHAGOGASTRODUODENOSCOPY (EGD) WITH PROPOFOL;  Surgeon: Rogene Houston, MD;  [facility];  Service: Endoscopy;  Laterality: N/A;  . HEMORRHOID BANDING N/A 08/01/2013   Procedure: HEMORRHOID BANDING;  Surgeon: Danie Binder, MD;  [facility];  Service: Endoscopy;  Laterality: N/A;  internal hemorrhoid banding  . LIVER BIOPSY N/A 11/04/2014   Procedure: LIVER BIOPSY;  Surgeon: Aviva Signs Md, MD;  [facility];  Service: General;  Laterality: N/A;  . POLYPECTOMY  2015   Dr.Fields  . POLYPECTOMY  08/09/2018   Procedure: POLYPECTOMY;  Surgeon: Rogene Houston, MD;  [facility];  Service: Endoscopy;;  colon  . REDUCTION MAMMAPLASTY Bilateral 2006  . UPPER GASTROINTESTINAL ENDOSCOPY  2006   Dr. Nita Sickle in [place]    Allergies Phentermine  Family History  Problem Relation Age of Onset  . Cancer Mother        head and neck  . Hypertension Father   . Hyperlipidemia Father   . Pancreatic cancer Maternal Grandfather   . Colon cancer Neg Hx     Social History Social History   Tobacco Use  . Smoking status: Never Smoker  . Smokeless tobacco: Never Used  . Tobacco comment: Never smoker  Substance Use Topics  . Alcohol use: Yes    Alcohol/week: 0.0 standard drinks    Comment: socially  . Drug use: No    Review of Systems  Constitutional: No fever/chills Eyes: No visual changes. ENT: No sore throat. Cardiovascular: Denies chest pain. Respiratory: Denies shortness of breath. Gastrointestinal: Cramping abdominal pain. Positive nausea and vomiting.  No diarrhea.  No constipation. Genitourinary: Negative for dysuria. Musculoskeletal: Negative for back pain. Skin: Negative for rash. Neurological: Negative for headaches, focal weakness or numbness. Foggy feeling since head trauma.   10-point ROS  otherwise negative.  ____________________________________________   PHYSICAL EXAM:  VITAL SIGNS: ED Triage Vitals  Enc Vitals Group     BP 02/11/19 2210 (!) 144/91     Pulse Rate 02/11/19 2210 100     Resp 02/11/19 2210 16     Temp 02/11/19 2210 98 F (36.7 C)     Temp Source 02/11/19 2210 Oral     SpO2 02/11/19 2210 100 %     Weight 02/11/19 2158 190 lb (86.2 kg)     Height 02/11/19 2158 5\' 11"  (1.803 m)    Constitutional: Alert and oriented. Well appearing and in no acute distress. Eyes: Conjunctivae are normal. Head: Atraumatic. Nose: No congestion/rhinnorhea. Mouth/Throat: Mucous membranes are moist.  Neck: No stridor. Cardiovascular: Normal rate, regular rhythm. Good peripheral circulation. Grossly normal heart sounds.   Respiratory: Normal respiratory effort.  No retractions. Lungs CTAB. Gastrointestinal: Soft and nontender. No distention.  Musculoskeletal: No gross deformities of extremities. Neurologic:  Normal speech and language.  Skin:  Skin is warm, dry and intact. No rash noted.  ____________________________________________  RADIOLOGY  None ____________________________________________   PROCEDURES  Procedure(s) performed:   Procedures  None ____________________________________________  INITIAL IMPRESSION / ASSESSMENT AND PLAN / ED COURSE  Pertinent labs & imaging results that were available during my care of the patient were reviewed by me and considered in my medical decision making (see chart for details).   Patient presents to the emergency department with concern for acute allergic reaction. Gave herself epipen. Appears improved and in no acute distress. Added Benadryl, Steroid, and Pepcid. Will observe for 4 hours post epipen injection to assess for rebound reaction.   Care transferred to Dr. Christy Gentles.    ____________________________________________  FINAL CLINICAL IMPRESSION(S) / ED DIAGNOSES  Final diagnoses:  Allergic reaction,  initial encounter     MEDICATIONS GIVEN DURING THIS VISIT:  Medications  diphenhydrAMINE (BENADRYL) injection 50 mg (50 mg Intravenous Given 02/11/19 2239)  famotidine (PEPCID) IVPB 20 mg premix (0 mg Intravenous Stopped 02/11/19 2337)  methylPREDNISolone sodium succinate (SOLU-MEDROL) 125 mg/2 mL injection 125 mg (125 mg Intravenous Given 02/11/19 2240)     NEW OUTPATIENT MEDICATIONS STARTED DURING THIS VISIT:  Discharge Medication List as of 02/12/2019 12:59 AM    START taking these medications   Details  predniSONE (DELTASONE) 20 MG tablet Take 2 tablets (40 mg total) by mouth daily for 4 days., Starting Wed 02/12/2019, Until Sun 02/16/2019, Normal        Note:  This document was prepared using Dragon voice recognition software and may include unintentional dictation errors.  Nanda Quinton, MD Emergency Medicine    Jimmy Stipes, [facility], MD 02/12/19 [phone]

## 2019-02-11 NOTE — Telephone Encounter (Signed)
Patient came to office regarding forms related to office visit yesterday, 02/10/19.  Temperature check read 100.2, re-checked by clinic staff x3. Patient concerned about elevated temperature; requested to relay message to Dr Aline Brochure. Aware may need to also contact primary care.

## 2019-02-11 NOTE — ED Notes (Signed)
Disregard prior charting by this nurse- charted on wrong pt

## 2019-02-11 NOTE — ED Triage Notes (Signed)
Pt stating she ate [place] meat around 1300 today. Pt states she used her epi pen "sometime after eating because I knew that I needed it." Pt has alpha gal allergy. Pt denies itching or swelling. Pt was tested for COVID today due to having a temperature while in her PCP office today.

## 2019-02-12 ENCOUNTER — Encounter: Payer: Self-pay | Admitting: Allergy & Immunology

## 2019-02-12 ENCOUNTER — Telehealth (INDEPENDENT_AMBULATORY_CARE_PROVIDER_SITE_OTHER): Payer: No Typology Code available for payment source | Admitting: Allergy & Immunology

## 2019-02-12 VITALS — Ht 66.0 in | Wt 192.0 lb

## 2019-02-12 DIAGNOSIS — L501 Idiopathic urticaria: Secondary | ICD-10-CM | POA: Diagnosis not present

## 2019-02-12 DIAGNOSIS — T7800XD Anaphylactic reaction due to unspecified food, subsequent encounter: Secondary | ICD-10-CM

## 2019-02-12 DIAGNOSIS — T782XXD Anaphylactic shock, unspecified, subsequent encounter: Secondary | ICD-10-CM | POA: Diagnosis not present

## 2019-02-12 DIAGNOSIS — J310 Chronic rhinitis: Secondary | ICD-10-CM | POA: Diagnosis not present

## 2019-02-12 DIAGNOSIS — T782XXA Anaphylactic shock, unspecified, initial encounter: Secondary | ICD-10-CM | POA: Insufficient documentation

## 2019-02-12 NOTE — ED Provider Notes (Signed)
Patient feels improved, and is requesting discharge home a 1/2-hour earlier She is in no acute distress Prescriptions for EpiPen and prednisone were sent to her pharmacy, this was discussed with patient.    Ripley Fraise, MD 02/12/19 0100

## 2019-02-12 NOTE — Telephone Encounter (Signed)
Ok

## 2019-02-12 NOTE — Progress Notes (Addendum)
NEW PATIENT (EPIC MYCHART VIDEO VISIT)  Date of Service/Encounter:  02/12/19  Referring provider: Mikey Kirschner, MD   Telemedicine Follow Up Visit via WebEx: I connected with Holley Dexter for [name] new patient visit on 02/12/19 by [facility] Video and verified that I am speaking with the correct person using two identifiers.   I discussed the limitations, risks, security and privacy concerns of performing an evaluation and management service by telemedicine and the availability of in person appointments. I also discussed with the patient that there may be [name] patient responsible charge related to this service. The patient expressed understanding and agreed to proceed.  Patient is at home.  Provider is at the office.  Visit start time: 8:45 AM Visit end time: 9:26 AM Insurance consent/check in by: [facility] consent and medical assistant/nurse: Kayla   Assessment:   Idiopathic anaphylaxis - with episodes of flushing/hives  Chronic rhinitis  Anaphylactic shock due to food - with [name] positive alpha gal panel  Plan/Recommendations:   1. Idiopathic anaphylaxis - Certainly your reactions could be related to mammalian meat products, but typically we do not see the cross reactivity unless the alpha gal IgE is higher than yours (which was around 5).  - We are going to get [name] panel to the most common foods to see if there is something else contributing to your symptoms, including [name] seafood panel to clarify this tuna allergy. - We are going to rule out [name] mast cell disorder with [name] serum tryptase. - We are going to get some labs to rule out carcinoid syndrome and pheochromocytoma, including urine studies. - We are also going to look into some autoimmune causes of anaphylaxis by sending [name] screening ANA and Rheumatoid Factor.  - We are also going to look at your inflammatory markers. - We will get some thyroid antibodies as well as [name] chronic urticaria panel to look for the presence of  anti-mast cell antibodies. - Anaphylaxis Management Plan provided. - EpiPen training reviewed.  - We could consider the use of Xolair as an immunomodulatory agent to help prevent these episodes of idiopathic anaphylaxis.  - Information on Xolair provided today.   2. Chronic rhinitis - We are going to get an environmental allergy panel. - We can provide more guidance pending the results of the testing.  - Continue with the use of cetirizine 14m daily (can increase to twice daily if needed to suppress reactions).  3. Return in about 3 months (around 05/15/2019). This can be an in-person, [name] virtual Webex or [name] telephone follow up visit.   Subjective:   Theresa [name] MFleisheris [name] 48y.o. female presenting today for evaluation of  Chief Complaint  Patient presents with  . Allergic Reaction    recently diagnosed with beef and pork allergy. ED due to allergic reaction yesterday.     Theresa [name] MMisseyhas [name] history of the following: Patient Active Problem List   Diagnosis Date Noted  . Idiopathic anaphylaxis 02/12/2019  . Chronic rhinitis 02/12/2019  . Chronic idiopathic urticaria 02/12/2019  . Hx of colonic polyps 07/17/2018  . Gastroesophageal reflux disease 07/17/2018  . Positive test for Epstein-Barr virus (EBV) 08/21/2017  . Anxiety and depression 07/28/2017  . Fatigue 07/12/2015  . Arthralgia 07/12/2015  . AP (abdominal pain) 07/06/2015  . Obesity 01/14/2014  . History of abnormal cervical Pap smear 12/02/2013  . Internal hemorrhoids with other complication 040/[id number] . Generalized anxiety disorder 12/25/2012  . GERD (gastroesophageal reflux  disease) 10/31/2011  . GASTROESOPHAGEAL REFLUX DISEASE 07/13/2008  . HEARTBURN 07/13/2008  . CONSTIPATION 05/13/2008  . BLOOD IN STOOL 05/13/2008  . CHANGE IN BOWELS 05/13/2008    History obtained from: chart review and patient.  Theresa [name] Axelson was referred by Theresa Kirschner, MD.     Theresa [name] is [name] 48 y.o. female presenting for an evaluation of  possible food allergy.   On June 4th, she had her first experience. She had spaghetti with [place] sausage. She had severe abdominal pain within 45 minutes of eating this. She had projectile vomiting as well as internal and external flushing. She means that the inside and outside of her body feels flushed. She "rode it out". She laid down and went to sleep. Overall symptoms last on average around 1-2 hours.   The following Tuesday after the spaghetti episode, she had pizza with sausage on it. This was [name] lot more severe. These were the same symptoms. After this episode, she started taking Benadryl. The benadryl seems to cut it down [name] lot.   She has started taking Benadryl to prevent reactions. She went on vacation and went to [facility]. These were likely [name] cross contamination issue. She is avoiding all red meat. She had an alpha gal drawn in June 2020. She has an EpiPen on hand. She pays $20 for two of them.   She is unable to have gel capsules of any kind. She took [name] Motrin gel cap two weeks ago for headaches. She has [name] history of migraines. She is avoiding all milk products. She did react to box mac and cheese even though she used the   She drank vegan wine but apparently there was [name] small disclaimer saying that there might be some amount of glycerin within the wine. She had no hives or swelling, but she did have head to toe redness. There is some itching associated with this. Again symptoms lasted around two hours.  She has avoided tuna. She reacted 7-8 years ago. She tolerates shellfish and other fin fish. It seems to be isolated to tuna only.    She does have [name] long standing history of IBS symptoms. She also has been evaluated for elevated for ANA and elevated inflammatory markers, although there was no definitive diagnosis determined. She was eventually diagnosed with chronic fatigue syndrome versus fibromyalgia, diagnoses which she is not happy with and seems hesitant to claim at all. She  was sleeping 20 hours per day at the worst. She was taking EPA/DHA supplemental but it had [name] gelcap so she stopped it. She has not felt well lately, so she is unsure whether not having these vitamins has worsened her course. She had ordered these on [employer].     She does report that she has sinus drainage and sore throat.  She does use cetirizine on [name] daily basis. She was using [name] nose spray but she has been avoiding steroids becausae it seemed to be causing some sleep disturbance. She uses cetirizine 59m daily to control her symptoms.   Otherwise, there is no history of other atopic diseases, including asthma, drug allergies, stinging insect allergies, eczema or contact dermatitis. There is no significant infectious history. Vaccinations are up to date.    Past Medical History: Patient Active Problem List   Diagnosis Date Noted  . Idiopathic anaphylaxis 02/12/2019  . Chronic rhinitis 02/12/2019  . Chronic idiopathic urticaria 02/12/2019  . Hx of colonic polyps 07/17/2018  . Gastroesophageal reflux disease 07/17/2018  . Positive  test for Epstein-Barr virus (EBV) 08/21/2017  . Anxiety and depression 07/28/2017  . Fatigue 07/12/2015  . Arthralgia 07/12/2015  . AP (abdominal pain) 07/06/2015  . Obesity 01/14/2014  . History of abnormal cervical Pap smear 12/02/2013  . Internal hemorrhoids with other complication 82/50/0370  . Generalized anxiety disorder 12/25/2012  . GERD (gastroesophageal reflux disease) 10/31/2011  . GASTROESOPHAGEAL REFLUX DISEASE 07/13/2008  . HEARTBURN 07/13/2008  . CONSTIPATION 05/13/2008  . BLOOD IN STOOL 05/13/2008  . CHANGE IN BOWELS 05/13/2008    Medication List:  Allergies as of 02/12/2019      Reactions   Phentermine Palpitations      Medication List       Accurate as of February 12, 2019  1:07 PM. If you have any questions, ask your nurse or doctor.        ALPRAZolam 1 MG tablet Commonly known as: XANAX TAKE 1/2 TO 1 TABLET BY MOUTH DURING THE DAY  FOR SEVERE ANXIETY AND 1 TO 2 TABLETS BY MOUTH EVERY NIGHT AT BEDTIME FOR SLEEP   citalopram 40 MG tablet Commonly known as: CELEXA TAKE 1 TABLET (40 MG TOTAL) BY MOUTH DAILY   cycloSPORINE 0.05 % ophthalmic emulsion Commonly known as: RESTASIS Place 1 drop into both eyes 2 (two) times daily.   dicyclomine 20 MG tablet Commonly known as: BENTYL Take 20 mg by mouth daily as needed for spasms.   EPINEPHrine 0.3 mg/0.3 mL Soaj injection Commonly known as: EpiPen 2-Pak Inject 0.3 mLs (0.3 mg total) into the muscle as needed for anaphylaxis.   ibuprofen 200 MG tablet Commonly known as: ADVIL Take 400-800 mg by mouth every 6 (six) hours as needed for headache or moderate pain.   levonorgestrel 20 MCG/24HR IUD Commonly known as: MIRENA 1 each by Intrauterine route once.   Melatonin 5 MG Caps Take 10 mg by mouth at bedtime as needed (for sleep).   predniSONE 20 MG tablet Commonly known as: DELTASONE Take 2 tablets (40 mg total) by mouth daily for 4 days.   RABEprazole 20 MG tablet Commonly known as: ACIPHEX Take 1 tablet (20 mg total) by mouth daily before breakfast.   sucralfate 1 GM/10ML suspension Commonly known as: CARAFATE Take 10 mLs (1 g total) by mouth 4 (four) times daily. What changed:   when to take this  reasons to take this       Birth History: non-contributory  Developmental History: non-contributory  Past Surgical History: Past Surgical History:  Procedure Laterality Date  . BIOPSY N/[name] 08/01/2013   Procedure: BIOPSY;  Surgeon: Danie Binder, MD;  [facility];  Service: Endoscopy;  Laterality: N/[name];  FOR MICROSCOPIC COLITIS  . BIOPSY  08/09/2018   Procedure: BIOPSY;  Surgeon: Rogene Houston, MD;  [facility];  Service: Endoscopy;;  gastric  . BREAST REDUCTION SURGERY  2009  . BREAST SURGERY  2007   breast reduction  . CHOLECYSTECTOMY N/[name] 11/04/2014   Procedure: LAPAROSCOPIC CHOLECYSTECTOMY;  Surgeon: Aviva Signs Md, MD;   Location[facility];  Service: General;  Laterality: N/[name];  . COLONOSCOPY  Oct 2009   Dr. Carlean Purl: int/ext hemorrhoids, ?mild proctitis but path benign, likely r/t irritation from bowel prep  . COLONOSCOPY N/[name] 08/01/2013   Procedure: COLONOSCOPY;  Surgeon: Danie Binder, MD;  [facility];  Service: Endoscopy;  Laterality: N/[name];  12:00  . COLONOSCOPY WITH PROPOFOL N/[name] 08/09/2018   Procedure: COLONOSCOPY WITH PROPOFOL;  Surgeon: Rogene Houston, MD;  [facility];  Service: Endoscopy;  Laterality: N/[name];  730  . ESOPHAGOGASTRODUODENOSCOPY  Dec 2009   Dr. Carlean Purl: reflux esophagitis, proximal gastric polyps, benign  . ESOPHAGOGASTRODUODENOSCOPY N/[name] 12/18/2014   Procedure: ESOPHAGOGASTRODUODENOSCOPY (EGD);  Surgeon: Rogene Houston, MD;  [facility];  Service: Endoscopy;  Laterality: N/[name];  230  . ESOPHAGOGASTRODUODENOSCOPY (EGD) WITH PROPOFOL N/[name] 08/09/2018   Procedure: ESOPHAGOGASTRODUODENOSCOPY (EGD) WITH PROPOFOL;  Surgeon: Rogene Houston, MD;  [facility];  Service: Endoscopy;  Laterality: N/[name];  . HEMORRHOID BANDING N/[name] 08/01/2013   Procedure: HEMORRHOID BANDING;  Surgeon: Danie Binder, MD;  [facility];  Service: Endoscopy;  Laterality: N/[name];  internal hemorrhoid banding  . LIVER BIOPSY N/[name] 11/04/2014   Procedure: LIVER BIOPSY;  Surgeon: Aviva Signs Md, MD;  [facility];  Service: General;  Laterality: N/[name];  . POLYPECTOMY  2015   Dr.Fields  . POLYPECTOMY  08/09/2018   Procedure: POLYPECTOMY;  Surgeon: Rogene Houston, MD;  [facility];  Service: Endoscopy;;  colon  . REDUCTION MAMMAPLASTY Bilateral 2006  . UPPER GASTROINTESTINAL ENDOSCOPY  2006   Dr. Nita Sickle in [place]     Family History: Family History  Problem Relation Age of Onset  . Cancer Mother        head and neck  . Hypertension Father   . Hyperlipidemia Father   . Pancreatic cancer Maternal Grandfather   . Colon cancer Neg Hx      Social  History: Madylyn lives at home with her her family. She works for [employer].     Review of Systems  Constitutional: Negative.  Negative for chills, fever, malaise/fatigue and weight loss.  HENT: Negative.  Negative for congestion, ear discharge, ear pain and sore throat.   Eyes: Negative for pain, discharge and redness.  Respiratory: Negative for cough, sputum production, shortness of breath and wheezing.   Cardiovascular: Negative.  Negative for chest pain and palpitations.  Gastrointestinal: Positive for abdominal pain, diarrhea, nausea and vomiting. Negative for constipation and heartburn.  Skin: Positive for itching and rash.  Neurological: Negative for dizziness and headaches.  Endo/Heme/Allergies: Negative for environmental allergies. Does not bruise/bleed easily.       Objective:   Height _0  (1.676 m), weight 192 lb (87.1 kg). Body mass index is 30.99 kg/m.   Physical Exam: deferred since this was [name] virtual visit only     Diagnostic studies: labs sent instead    Total of 41 minutes, greater than 50% of which was spent in discussion of treatment and management options.     Salvatore Marvel, MD [facility]

## 2019-02-12 NOTE — Discharge Instructions (Signed)

## 2019-02-12 NOTE — Telephone Encounter (Signed)
Followed up w/patient; states spoke with Dr Aline Brochure. Thanked for calling. Aware of appointment Monday, 02/17/19.

## 2019-02-12 NOTE — Patient Instructions (Addendum)
1. Idiopathic anaphylaxis - Certainly your reactions could be related to mammalian meat products, but typically we do not see the cross reactivity unless the alpha gal IgE is higher than yours (which was around 5).  - We are going to get a panel to the most common foods to see if there is something else contributing to your symptoms, including a seafood panel to clarify this tuna allergy. - We are going to rule out a mast cell disorder with a serum tryptase. - We are going to get some labs to rule out carcinoid syndrome and pheochromocytoma, including urine studies. - We are also going to look into some autoimmune causes of anaphylaxis by sending a screening ANA and Rheumatoid Factor.  - We are also going to look at your inflammatory markers. - We will get some thyroid antibodies as well as a chronic urticaria panel to look for the presence of anti-mast cell antibodies. - Anaphylaxis Management Plan provided. - EpiPen training reviewed.  - We could consider the use of Xolair as an immunomodulatory agent to help prevent these episodes of idiopathic anaphylaxis.  - Information on Xolair provided today.   2. Chronic rhinitis - We are going to get an environmental allergy panel. - We can provide more guidance pending the results of the testing.   3. Return in about 3 months (around 05/15/2019). This can be an in-person, a virtual Webex or a telephone follow up visit.   Please inform us of any Emergency Department visits, hospitalizations, or changes in symptoms. Call us before going to the ED for breathing or allergy symptoms since we might be able to fit you in for a sick visit. Feel free to contact us anytime with any questions, problems, or concerns.  It was a pleasure to meet you today!  Websites that have reliable patient information: 1. American Academy of Asthma, Allergy, and Immunology: www.aaaai.org 2. Food Allergy Research and Education (FARE): foodallergy.org 3. Mothers of  Asthmatics: http://www.asthmacommunitynetwork.org 4. American College of Allergy, Asthma, and Immunology: www.acaai.org  "Like" [place] on Facebook and Instagram for our latest updates!      Make sure you are registered to vote! If you have moved or changed any of your contact information, you will need to get this updated before voting!  In some cases, you MAY be able to register to vote online: [email or web address]    Voter ID laws are NOT going into effect for the General Election in November 2020! DO NOT let this stop you from exercising your right to vote!   Absentee voting is the SAFEST way to vote during the coronavirus pandemic!   Download and print an absentee ballot request form at [email or web address] or you can scan the QR code below with your smart phone:      More information on absentee ballots can be found here: [email or web address]

## 2019-02-12 NOTE — ED Notes (Signed)
ED Provider at bedside. 

## 2019-02-17 ENCOUNTER — Ambulatory Visit (INDEPENDENT_AMBULATORY_CARE_PROVIDER_SITE_OTHER): Payer: No Typology Code available for payment source | Admitting: Orthopedic Surgery

## 2019-02-17 ENCOUNTER — Encounter: Payer: Self-pay | Admitting: Orthopedic Surgery

## 2019-02-17 ENCOUNTER — Other Ambulatory Visit: Payer: Self-pay

## 2019-02-17 ENCOUNTER — Other Ambulatory Visit (HOSPITAL_COMMUNITY)
Admission: RE | Admit: 2019-02-17 | Discharge: 2019-02-17 | Disposition: A | Discharge status: Home / Self Care | Payer: No Typology Code available for payment source | Source: Ambulatory Visit | Attending: Allergy & Immunology | Admitting: Allergy & Immunology

## 2019-02-17 VITALS — BP 149/94 | HR 71 | Temp 98.2°F | Ht 66.0 in | Wt 185.0 lb

## 2019-02-17 DIAGNOSIS — T782XXD Anaphylactic shock, unspecified, subsequent encounter: Secondary | ICD-10-CM | POA: Diagnosis present

## 2019-02-17 DIAGNOSIS — S43101A Unspecified dislocation of right acromioclavicular joint, initial encounter: Secondary | ICD-10-CM | POA: Insufficient documentation

## 2019-02-17 DIAGNOSIS — S43101D Unspecified dislocation of right acromioclavicular joint, subsequent encounter: Secondary | ICD-10-CM | POA: Diagnosis not present

## 2019-02-17 DIAGNOSIS — S060X9A Concussion with loss of consciousness of unspecified duration, initial encounter: Secondary | ICD-10-CM | POA: Insufficient documentation

## 2019-02-17 DIAGNOSIS — S060X9D Concussion with loss of consciousness of unspecified duration, subsequent encounter: Secondary | ICD-10-CM | POA: Diagnosis not present

## 2019-02-17 HISTORY — DX: Concussion with loss of consciousness of unspecified duration, initial encounter: S06.0X9A

## 2019-02-17 LAB — SEDIMENTATION RATE: Sed Rate: 12 mm/hr (ref 0–22)

## 2019-02-17 LAB — C-REACTIVE PROTEIN: CRP: <0.8 mg/dL (ref <1.0)

## 2019-02-17 MED FILL — DICYCLOMINE 10 MG CAPSULE: 10 | 30 days supply | Qty: 90 | Fill #0

## 2019-02-17 NOTE — Patient Instructions (Signed)
OOW 1 WEEK

## 2019-02-17 NOTE — Progress Notes (Signed)
Chief Complaint  Patient presents with  . Shoulder Pain    right   . Concussion    no better / too much activity this weekend feels bad, fuzzy headache   Encounter Diagnosis  Name Primary?  . 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  She also had an AC joint injury to the right shoulder.  She was advised to rest she was taken out of work she was advised to decrease her screen time as well and she was doing fairly well improving until this weekend when she says she overdid it and she still having a lot of her  sent him such as headache nausea feeling like she is in a fog and sleepiness with excessive sleeping  She went to the ER and was evaluated there no CAT scan was needed she was advised to continue with postconcussion treatment   BP (!) 149/94   Pulse 71   Ht 5\' 6"  (1.676 m)   Wt 185 lb (83.9 kg)   BMI 29.86 kg/m   Pupils are equal to light and accommodation extraocular muscles are intact she is awake she is alert she is oriented x3 she does seem a little sluggish  Neck range of motion is normal  Ear canals are clean no bruising around the ear canals and no post injury bruising around the scalp or head no depression  Reflexes are 2+ and equal in the elbow and wrist strength is normal no focal signs are noted  Right shoulder full range of motion mild tenderness over the [facility] joint no instability pain across the chest when reaching none   Encounter Diagnosis  Name Primary?  . Concussion with loss of consciousness, subsequent encounter Yes    Concussion: Continue rest, out of work follow-up in a week  Right shoulder injury continue normal activity no heavy lifting  Blood pressure running high she says is normally 90/50 range  I have asked her to keep a diary of her blood pressure and bring it in and if necessary we can asked Dr. Wolfgang Phoenix to evaluate her for  hypertension

## 2019-02-18 ENCOUNTER — Telehealth: Payer: Self-pay | Admitting: *Deleted

## 2019-02-18 LAB — PROTEIN ELECTROPHORESIS, SERUM
A/G Ratio: 1.1 (ref 0.7–1.7)
Albumin ELP: 3.5 g/dL (ref 2.9–4.4)
Alpha-1-Globulin: 0.2 g/dL (ref 0.0–0.4)
Alpha-2-Globulin: 0.7 g/dL (ref 0.4–1.0)
Beta Globulin: 1 g/dL (ref 0.7–1.3)
Gamma Globulin: 1.2 g/dL (ref 0.4–1.8)
Globulin, Total: 3.1 g/dL (ref 2.2–3.9)
Total Protein ELP: 6.6 g/dL (ref 6.0–8.5)

## 2019-02-18 LAB — ANA W/REFLEX IF POSITIVE: Anti Nuclear Antibody (ANA): NEGATIVE

## 2019-02-18 LAB — C4 COMPLEMENT: Complement C4, Body Fluid: 19 mg/dL (ref 14–44)

## 2019-02-18 LAB — THYROID ANTIBODIES
Thyroglobulin Antibody: <1 IU/mL (ref 0.0–0.9)
Thyroperoxidase Ab SerPl-aCnc: 37 IU/mL — ABNORMAL HIGH (ref 0–34)

## 2019-02-18 LAB — C3 COMPLEMENT: C3 Complement: 121 mg/dL (ref 82–167)

## 2019-02-18 LAB — RHEUMATOID FACTOR: Rheumatoid fact SerPl-aCnc: <10 IU/mL (ref 0.0–13.9)

## 2019-02-18 NOTE — Telephone Encounter (Signed)
Called patient to discuss Xolair therapy and she advised she has appt with acupuncture she wants to try first. I advised her if same doesn't work to reach out to me to discuss starting Xolair

## 2019-02-19 ENCOUNTER — Telehealth: Payer: Self-pay | Admitting: Allergy & Immunology

## 2019-02-19 ENCOUNTER — Ambulatory Visit: Payer: Self-pay | Admitting: Allergy & Immunology

## 2019-02-19 LAB — MISC LABCORP TEST (SEND OUT)
Labcorp test code: 600531
Labcorp test code: 660423

## 2019-02-19 LAB — ALLERGENS W/TOTAL IGE AREA 2
Alternaria Alternata IgE: 0.14 kU/L — AB
Aspergillus Fumigatus IgE: 0.13 kU/L — AB
Bermuda Grass IgE: 0.15 kU/L — AB
Cat Dander IgE: 0.15 kU/L — AB
Cedar, Mountain IgE: 0.46 kU/L — AB
Cladosporium Herbarum IgE: <0.1 kU/L
Cockroach, German IgE: <0.1 kU/L
Common Silver Birch IgE: 0.14 kU/L — AB
Cottonwood IgE: 0.19 kU/L — AB
D Farinae IgE: <0.1 kU/L
D Pteronyssinus IgE: <0.1 kU/L
Dog Dander IgE: 0.22 kU/L — AB
Elm, American IgE: 0.17 kU/L — AB
IgE (Immunoglobulin E), Serum: 531 IU/mL — ABNORMAL HIGH (ref 6–495)
Johnson Grass IgE: 0.18 kU/L — AB
Maple/Box Elder IgE: 0.29 kU/L — AB
Mouse Urine IgE: <0.1 kU/L
Oak, White IgE: 0.28 kU/L — AB
Pecan, Hickory IgE: 0.15 kU/L — AB
Penicillium Chrysogen IgE: <0.1 kU/L
Pigweed, Rough IgE: 0.11 kU/L — AB
Ragweed, Short IgE: 0.35 kU/L — AB
Sheep Sorrel IgE Qn: <0.1 kU/L
Timothy Grass IgE: 0.11 kU/L — AB
White Mulberry IgE: <0.1 kU/L

## 2019-02-19 LAB — CHROMOGRANIN A: Chromogranin A (ng/mL): 499.2 ng/mL — ABNORMAL HIGH (ref 0.0–101.8)

## 2019-02-19 LAB — TRYPTASE: Tryptase: 3.4 ug/L (ref 2.2–13.2)

## 2019-02-19 NOTE — Telephone Encounter (Signed)
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 Cash pay of course, not covered my insurance.   Salvatore Marvel, MD Allergy and [place] [facility] [place]

## 2019-02-21 LAB — METANEPHRINES, PLASMA
Metanephrine, Free: 16.2 pg/mL (ref 0.0–88.0)
Normetanephrine, Free: 46 pg/mL (ref 0.0–125.8)

## 2019-02-24 ENCOUNTER — Encounter: Payer: Self-pay | Admitting: Orthopedic Surgery

## 2019-02-24 ENCOUNTER — Ambulatory Visit (INDEPENDENT_AMBULATORY_CARE_PROVIDER_SITE_OTHER): Payer: No Typology Code available for payment source | Admitting: Orthopedic Surgery

## 2019-02-24 ENCOUNTER — Other Ambulatory Visit: Payer: Self-pay

## 2019-02-24 VITALS — BP 124/70 | HR 86 | Temp 97.6°F | Ht 66.0 in | Wt 189.0 lb

## 2019-02-24 DIAGNOSIS — S060X9D Concussion with loss of consciousness of unspecified duration, subsequent encounter: Secondary | ICD-10-CM | POA: Diagnosis not present

## 2019-02-24 DIAGNOSIS — S43101D Unspecified dislocation of right acromioclavicular joint, subsequent encounter: Secondary | ICD-10-CM | POA: Diagnosis not present

## 2019-02-24 NOTE — Patient Instructions (Signed)
RTW tues July 14

## 2019-02-24 NOTE — Progress Notes (Signed)
Encounter Diagnoses  Name Primary?  . Separation of right acromioclavicular joint, subsequent encounter Yes  . Concussion with loss of consciousness, subsequent encounter     Chief Complaint  Patient presents with  . Shoulder Pain    right   . Concussion    feels okay if she is "busy" but has headaches at rest     Hpi: feels better   Ros a little headache, no nausea, light sensitive minimal   BP 124/70   Pulse 86   Temp 97.6 F (36.4 C)   Ht 5\' 6"  (1.676 m)   Wt 189 lb (85.7 kg)   BMI 30.51 kg/m   Bps normal at home   Physical Exam Vitals signs and nursing note reviewed.  Constitutional:      Appearance: Normal appearance.  Neurological:     General: No focal deficit present.     Mental Status: She is alert and oriented to person, place, and time. Mental status is at baseline.  Psychiatric:        Mood and Affect: Mood normal.        Behavior: Behavior normal.        Thought Content: Thought content normal.        Judgment: Judgment normal.     Right shoulder:  Tender rt ac joint mild FROM + x shoulder test vs resistance  neuro vascular exam normal   RTW 02/25/19  Check in on my chart in 1 week

## 2019-02-25 LAB — MISC LABCORP TEST (SEND OUT): Labcorp test code: 820022

## 2019-02-26 ENCOUNTER — Other Ambulatory Visit: Payer: Self-pay

## 2019-02-26 ENCOUNTER — Ambulatory Visit (INDEPENDENT_AMBULATORY_CARE_PROVIDER_SITE_OTHER): Payer: No Typology Code available for payment source | Admitting: Orthopedic Surgery

## 2019-02-26 ENCOUNTER — Ambulatory Visit (HOSPITAL_COMMUNITY)
Admission: RE | Admit: 2019-02-26 | Discharge: 2019-02-26 | Disposition: A | Discharge status: Home / Self Care | Payer: No Typology Code available for payment source | Source: Ambulatory Visit | Attending: Orthopedic Surgery | Admitting: Orthopedic Surgery

## 2019-02-26 ENCOUNTER — Encounter: Payer: Self-pay | Admitting: Family Medicine

## 2019-02-26 ENCOUNTER — Encounter: Payer: Self-pay | Admitting: Orthopedic Surgery

## 2019-02-26 ENCOUNTER — Telehealth: Payer: Self-pay | Admitting: Family Medicine

## 2019-02-26 VITALS — BP 130/84 | HR 82 | Temp 98.6°F | Ht 66.0 in | Wt 189.0 lb

## 2019-02-26 DIAGNOSIS — S060X9D Concussion with loss of consciousness of unspecified duration, subsequent encounter: Secondary | ICD-10-CM

## 2019-02-26 DIAGNOSIS — G44319 Acute post-traumatic headache, not intractable: Secondary | ICD-10-CM | POA: Diagnosis not present

## 2019-02-26 DIAGNOSIS — G4489 Other headache syndrome: Secondary | ICD-10-CM | POA: Diagnosis not present

## 2019-02-26 DIAGNOSIS — S060X9A Concussion with loss of consciousness of unspecified duration, initial encounter: Secondary | ICD-10-CM

## 2019-02-26 NOTE — Addendum Note (Signed)
Addended by: Dairl Ponder on: 02/26/2019 03:23 PM   Modules accepted: Orders

## 2019-02-26 NOTE — Progress Notes (Signed)
Chief Complaint  Patient presents with  . 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  System review denies catching locking giving way of the legs denies weakness of upper extremities, does complain of light  Sensitivity  BP 130/84   Pulse 82   Temp 98.6 F (37 C)   Ht 5\' 6"  (1.676 m)   Wt 189 lb (85.7 kg)   BMI 30.51 kg/m   Patient has lack of concentration tenderness in her neck tenderness to palpation over the right cranium and temple.  She does appear oriented but somewhat sluggish mood and affect show flattened affect depressed mood  Gait and station are normal Romberg test slightly positive upper extremity strength grip is normal bilaterally dorsiflexion plantarflexion strength is normal bilaterally mild decreased range of motion of the cervical spine reflexes are 2+ at the ankle knee elbows and forearm and they are equal bilaterally are no sensory deficits  Coordination of care with neurology Dr. Merlene Laughter has agreed to see the patient and we have agreed that a CAT scan should be ordered  Patient will be referred to him for further treatment  Out of work another week  Encounter Diagnoses  Name Primary?  . Other headache syndrome   . Acute post-traumatic headache, not intractable   . Concussion with loss of consciousness, subsequent encounter Yes

## 2019-02-26 NOTE — Addendum Note (Signed)
Addended byCandice Camp on: 02/26/2019 11:12 AM   Modules accepted: Orders

## 2019-02-26 NOTE — Telephone Encounter (Signed)
Ok do rferal to dr Jola Schmidt concussion

## 2019-02-26 NOTE — Telephone Encounter (Signed)
Dr. Aline Brochure saw patient for concussion with loss of consciousness Apparently he coordinated care with Dr.Doonquah He sent me a staff message requesting a referral to Dr.Doonquah Apparently has an appointment later this week Please touch base with Dr. Braulio Conte is his patient- regarding if he will approve the referral or request to see the patient

## 2019-02-26 NOTE — Telephone Encounter (Signed)
Referral ordered in EPIC. 

## 2019-02-26 NOTE — Patient Instructions (Addendum)
Call your primary care, to let them know you need the referral to see Neurologist, I will make appointment, but your Insurance requires you to have PCP referral   You will also need to call [facility] and let them know you are seeing a Neurologist   Go now for the CT scan at [facility]

## 2019-02-27 ENCOUNTER — Other Ambulatory Visit (HOSPITAL_COMMUNITY)
Admission: RE | Admit: 2019-02-27 | Discharge: 2019-02-27 | Disposition: A | Discharge status: Home / Self Care | Payer: No Typology Code available for payment source | Source: Ambulatory Visit | Attending: Allergy & Immunology | Admitting: Allergy & Immunology

## 2019-02-27 DIAGNOSIS — T782XXD Anaphylactic shock, unspecified, subsequent encounter: Secondary | ICD-10-CM | POA: Insufficient documentation

## 2019-02-28 ENCOUNTER — Encounter: Payer: Self-pay | Admitting: Family Medicine

## 2019-03-05 ENCOUNTER — Ambulatory Visit: Payer: No Typology Code available for payment source | Admitting: Orthopedic Surgery

## 2019-03-10 ENCOUNTER — Encounter: Payer: Self-pay | Admitting: Family Medicine

## 2019-03-10 ENCOUNTER — Encounter: Payer: Self-pay | Admitting: Allergy & Immunology

## 2019-03-10 ENCOUNTER — Other Ambulatory Visit: Payer: Self-pay | Admitting: Family Medicine

## 2019-03-10 LAB — CATECHOLAMINE+VMA, 24-HR URINE
Dopamine, Rand Ur: 121 ug/L
Dopamine, Ur, 24Hr: 197 ug/24 hr (ref 0–510)
Epinephrine, Rand Ur: <1 ug/L
Epinephrine, U, 24Hr: <2 ug/24 hr (ref 0–20)
Norepinephrine, Rand Ur: 8 ug/L
Norepinephrine,U,24H: 13 ug/24 hr (ref 0–135)
Total Volume: 1625
VMA, 24H Ur Adult: 1.6 mg/24 hr (ref 0.0–7.5)
VMA, Urine: 1 mg/L

## 2019-03-10 MED FILL — IBUPROFEN 800 MG TAB: 800 | 30 days supply | Qty: 60 | Fill #0

## 2019-03-10 NOTE — Telephone Encounter (Signed)
Please contact patient and have her set up appt. Then route back to nurses. Thank you

## 2019-03-10 NOTE — Telephone Encounter (Signed)
Pt has virtual scheduled 8/7 with Hoyle Sauer

## 2019-03-11 NOTE — Telephone Encounter (Signed)
Can someone talk to [facility] about the urine 5-HIAA test? It is "Active" but I cannot seem to see any results?  Salvatore Marvel, MD Allergy and [place] [facility] [place]

## 2019-03-12 ENCOUNTER — Other Ambulatory Visit: Payer: Self-pay | Admitting: Family Medicine

## 2019-03-12 ENCOUNTER — Encounter: Payer: Self-pay | Admitting: Allergy & Immunology

## 2019-03-13 ENCOUNTER — Telehealth: Payer: Self-pay | Admitting: *Deleted

## 2019-03-13 ENCOUNTER — Encounter: Payer: Self-pay | Admitting: *Deleted

## 2019-03-13 ENCOUNTER — Other Ambulatory Visit: Payer: Self-pay | Admitting: *Deleted

## 2019-03-13 ENCOUNTER — Encounter: Payer: Self-pay | Admitting: Allergy & Immunology

## 2019-03-13 ENCOUNTER — Encounter: Payer: Self-pay | Admitting: Family Medicine

## 2019-03-13 MED FILL — ALPRAZolam 1 MG TABS: 1 | 30 days supply | Qty: 90 | Fill #0

## 2019-03-13 NOTE — Telephone Encounter (Signed)
It was ordered on July 1st. We will check with [facility] to see what happened.   Salvatore Marvel, MD Allergy and [place] [facility] [place]

## 2019-03-13 NOTE — Telephone Encounter (Signed)
Nurses go ahead and do refill I will sign off on it

## 2019-03-13 NOTE — Telephone Encounter (Signed)
Ok times one 

## 2019-03-13 NOTE — Telephone Encounter (Signed)
Orders have been faxed to patient and she has an extra jug for her to collect urine sample

## 2019-03-13 NOTE — Telephone Encounter (Signed)
See phone mess response

## 2019-03-14 ENCOUNTER — Other Ambulatory Visit: Payer: Self-pay

## 2019-03-14 ENCOUNTER — Other Ambulatory Visit (HOSPITAL_COMMUNITY)
Admission: RE | Admit: 2019-03-14 | Discharge: 2019-03-14 | Disposition: A | Discharge status: Home / Self Care | Payer: No Typology Code available for payment source | Source: Ambulatory Visit | Attending: Allergy & Immunology | Admitting: Allergy & Immunology

## 2019-03-14 DIAGNOSIS — T782XXD Anaphylactic shock, unspecified, subsequent encounter: Secondary | ICD-10-CM | POA: Insufficient documentation

## 2019-03-14 MED ORDER — ALPRAZOLAM 1 MG PO TABS: ORAL_TABLET | ORAL | 0 refills | Status: DC

## 2019-03-14 NOTE — Telephone Encounter (Signed)
done

## 2019-03-21 ENCOUNTER — Other Ambulatory Visit: Payer: Self-pay

## 2019-03-21 ENCOUNTER — Ambulatory Visit (INDEPENDENT_AMBULATORY_CARE_PROVIDER_SITE_OTHER): Payer: No Typology Code available for payment source | Admitting: Nurse Practitioner

## 2019-03-21 DIAGNOSIS — F411 Generalized anxiety disorder: Secondary | ICD-10-CM

## 2019-03-21 NOTE — Progress Notes (Signed)
   Subjective:  PHONE VISIT  Patient ID: Theresa [name], female    DOB: April 02, 1971, 48 y.o.   MRN: [id number]  HPI Med check. Pt states she wants to discuss cutting back on xanax.  Virtual Visit via Telephone Note  I connected with Theresa [name] on 03/21/19 at  3:40 PM EDT by telephone and verified that I am speaking with the correct person using two identifiers.  Location: Patient: home Provider: office   I discussed the limitations, risks, security and privacy concerns of performing an evaluation and management service by telephone and the availability of in person appointments. I also discussed with the patient that there may be a patient responsible charge related to this service. The patient expressed understanding and agreed to proceed.   History of Present Illness: Presents to discuss her use of Xanax for her anxiety.  Has actually been calmer and doing better since COVID.  States she is starting to realize that she does not have control over every aspect of her life.  Has been slowly trying to wean down her Xanax dose.  Would like to try to come off of it over time.  Recently got a refill, does not need any further refills at this point.  Her goal is to cut back to 60/month with her next prescription and continue to wean off.   Observations/Objective: NAD.  Alert, oriented.  Calm affect.  Thoughts logical coherent and relevant. Today's visit was via telephone Physical exam was not possible for this visit   Assessment and Plan: Problem List Items Addressed This Visit      Other   Generalized anxiety disorder - Primary       Follow Up Instructions: Continue to slowly wean off Xanax as tolerated.  Patient to contact pharmacy when she needs another refill.  Hopefully she can discontinue medication altogether or at least decrease the number she needs per month.   I discussed the assessment and treatment plan with the patient. The patient was provided an opportunity to ask  questions and all were answered. The patient agreed with the plan and demonstrated an understanding of the instructions.   The patient was advised to call back or seek an in-person evaluation if the symptoms worsen or if the condition fails to improve as anticipated.  I provided 15 minutes of non-face-to-face time during this encounter.     Review of Systems     Objective:   Physical Exam        Assessment & Plan:

## 2019-03-22 ENCOUNTER — Encounter: Payer: Self-pay | Admitting: Nurse Practitioner

## 2019-03-22 LAB — 5 HIAA, QUANTITATIVE, URINE, 24 HOUR
5-HIAA, Ur: 2.4 mg/L
5-HIAA,Quant.,24 Hr Urine: 4.3 mg/24 hr (ref 0.0–14.9)
Total Volume: 1800

## 2019-03-24 ENCOUNTER — Encounter: Payer: Self-pay | Admitting: Allergy & Immunology

## 2019-04-10 ENCOUNTER — Other Ambulatory Visit: Payer: Self-pay | Admitting: Family Medicine

## 2019-04-10 MED FILL — ALPRAZolam 1 MG TABS: 1 | 20 days supply | Qty: 60 | Fill #0

## 2019-04-10 NOTE — Telephone Encounter (Signed)
See carolyns note decr to 60 tabs with two monthly ref

## 2019-04-10 NOTE — Telephone Encounter (Signed)
Hi  Dr Jeannine Kitten please

## 2019-04-23 ENCOUNTER — Other Ambulatory Visit: Payer: Self-pay | Admitting: *Deleted

## 2019-04-23 ENCOUNTER — Encounter: Payer: Self-pay | Admitting: Allergy & Immunology

## 2019-04-23 MED ORDER — FLUTICASONE PROPIONATE 50 MCG/ACT NA SUSP: 1.0000 | Freq: Two times a day (BID) | NASAL | 5 refills | Status: DC | PRN

## 2019-04-23 MED FILL — FLUTICASONE PROP 50 MCG SPR: 50 | 30 days supply | Qty: 16 | Fill #0

## 2019-04-28 MED FILL — ALPRAZolam 1 MG TABS: 1 | 20 days supply | Qty: 60 | Fill #1

## 2019-04-29 ENCOUNTER — Other Ambulatory Visit: Payer: Self-pay | Admitting: Family Medicine

## 2019-04-29 MED FILL — CITALOPRAM HBR 40 MG TABLET: 40 | 90 days supply | Qty: 90 | Fill #0

## 2019-05-02 MED FILL — RABEPRAZOLE SOD DR 20 MG TA: 20 | 90 days supply | Qty: 90 | Fill #2

## 2019-05-09 ENCOUNTER — Encounter: Payer: Self-pay | Admitting: Allergy & Immunology

## 2019-05-14 ENCOUNTER — Ambulatory Visit: Payer: No Typology Code available for payment source | Admitting: Allergy & Immunology

## 2019-05-17 MED FILL — FLUTICASONE PROP 50 MCG SPR: 50 | 30 days supply | Qty: 16 | Fill #1

## 2019-05-18 MED FILL — ALPRAZolam 1 MG TABS: 1 | 20 days supply | Qty: 60 | Fill #2

## 2019-05-20 ENCOUNTER — Encounter: Payer: Self-pay | Admitting: Nurse Practitioner

## 2019-05-21 ENCOUNTER — Encounter: Payer: Self-pay | Admitting: Nurse Practitioner

## 2019-05-23 ENCOUNTER — Encounter: Payer: Self-pay | Admitting: Allergy & Immunology

## 2019-05-23 ENCOUNTER — Other Ambulatory Visit: Payer: Self-pay

## 2019-05-23 ENCOUNTER — Ambulatory Visit (INDEPENDENT_AMBULATORY_CARE_PROVIDER_SITE_OTHER): Payer: No Typology Code available for payment source | Admitting: Allergy & Immunology

## 2019-05-23 VITALS — BP 132/80 | HR 82 | Temp 98.2°F | Resp 18 | Ht 66.0 in | Wt 200.0 lb

## 2019-05-23 DIAGNOSIS — T7800XD Anaphylactic reaction due to unspecified food, subsequent encounter: Secondary | ICD-10-CM | POA: Diagnosis not present

## 2019-05-23 DIAGNOSIS — T782XXD Anaphylactic shock, unspecified, subsequent encounter: Secondary | ICD-10-CM | POA: Diagnosis not present

## 2019-05-23 DIAGNOSIS — M791 Myalgia, unspecified site: Secondary | ICD-10-CM | POA: Diagnosis not present

## 2019-05-23 NOTE — Patient Instructions (Addendum)
1. Idiopathic anaphylaxis - with a positive alpha gal  - Like we have mentioned, the course of alpha gal is relapsing and remitting.  - It is difficult to know how this will progress, so I think they safest thing to do would be to avoid red meats entirely.  - EpiPen is up to date. - We are going to get Lyme titers today and a repeat chromogranin A.  - We will MyChart you with the results of the testing.  - I will contact you with the information on how to get a hold of the clinic At [facility] if you want another opinion on the matter.   2. Return in about 6 months (around 11/21/2019). This can be an in-person, a virtual Webex or a telephone follow up visit.   Please inform us of any Emergency Department visits, hospitalizations, or changes in symptoms. Call us before going to the ED for breathing or allergy symptoms since we might be able to fit you in for a sick visit. Feel free to contact us anytime with any questions, problems, or concerns.  It was a pleasure to meet you finally today!  Websites that have reliable patient information: 1. American Academy of Asthma, Allergy, and Immunology: www.aaaai.org 2. Food Allergy Research and Education (FARE): foodallergy.org 3. Mothers of Asthmatics: http://www.asthmacommunitynetwork.org 4. American College of Allergy, Asthma, and Immunology: www.acaai.org  "Like" [place] on Facebook and Instagram for our latest updates!      Make sure you are registered to vote! If you have moved or changed any of your contact information, you will need to get this updated before voting!  In some cases, you MAY be able to register to vote online: [email or web address]    Voter ID laws are NOT going into effect for the General Election in November 2020! DO NOT let this stop you from exercising your right to vote!   Absentee voting is the SAFEST way to vote during the coronavirus pandemic!   Download and print an absentee  ballot request form at [email or web address] or you can scan the QR code below with your smart phone:      More information on absentee ballots can be found here: [email or web address]

## 2019-05-23 NOTE — Progress Notes (Signed)
FOLLOW UP  Date of Service/Encounter:  05/23/19   Assessment:   Idiopathic anaphylaxis - with episodes of flushing/hives  Chronic rhinitis (cat, dog, grasses, molds, trees, ragweed, and weeds)  Anaphylactic shock due to food - with a positive alpha gal panel  S/p chiropractor visit to treat alpha gal [facility])  Generalized myalgias - Lyme titers pending  Plan/Recommendations:   1. Idiopathic anaphylaxis - with a positive alpha gal  - Like we have mentioned, the course of alpha gal is relapsing and remitting.  - It is difficult to know how this will progress, so I think they safest thing to do would be to avoid red meats entirely.  - EpiPen is up to date. - We are going to get Lyme titers today and a repeat chromogranin A.  - We will MyChart you with the results of the testing.  - I will contact you with the information on how to get a hold of the clinic At [facility] if you want another opinion on the matter.   2. Return in about 6 months (around 11/21/2019). This can be an in-person, a virtual Webex or a telephone follow up visit.  Subjective:   Theresa [name] is a 48 y.o. female presenting today for follow up of  Chief Complaint  Patient presents with  . Food Intolerance    pork   . Joint Pain    probably Lyme disease.     Theresa [name] has a history of the following: Patient Active Problem List   Diagnosis Date Noted  . Separation of right acromioclavicular joint 02/17/2019  . Concussion with loss of consciousness 02/17/2019  . Idiopathic anaphylaxis 02/12/2019  . Chronic rhinitis 02/12/2019  . Chronic idiopathic urticaria 02/12/2019  . Hx of colonic polyps 07/17/2018  . Gastroesophageal reflux disease 07/17/2018  . Positive test for Epstein-Barr virus (EBV) 08/21/2017  . Anxiety and depression 07/28/2017  . Fatigue 07/12/2015  . Arthralgia 07/12/2015  . AP (abdominal pain) 07/06/2015  . Obesity 01/14/2014  . History of abnormal cervical Pap  smear 12/02/2013  . Internal hemorrhoids with other complication 0000000  . Generalized anxiety disorder 12/25/2012  . GERD (gastroesophageal reflux disease) 10/31/2011  . GASTROESOPHAGEAL REFLUX DISEASE 07/13/2008  . HEARTBURN 07/13/2008  . CONSTIPATION 05/13/2008  . BLOOD IN STOOL 05/13/2008  . CHANGE IN BOWELS 05/13/2008    PCP: Pearson Forster NP   History obtained from: chart review and patient.  Theresa [name] is a 48 y.o. female presenting for a follow up visit.  She was last seen in July 2020 via a video visit for new patient evaluation.  At that time, she was experiencing episodes of what I felt were idiopathic anaphylaxis, including episodes of flushing and hives.  She had already been diagnosed with alpha gal and was avoiding red meat.  However she was not avoiding milk so I recommended that she do that.  Her alpha gal IgE was only around 5, therefore I felt that cross-reactivity through ingestion of milk was unlikely.  We did get labs to rule out mastocytosis as well as carcinoid syndrome and pheochromocytoma.  We did an autoimmune evaluation as well including an ANA and rheumatoid factor.  Inflammatory markers were normal.  Chronic urticaria panel was normal as well.  We did discuss the use of Xolair as an immunomodulatory agent.  We did obtain a chromogranin A which was elevated (499 with a range of 0-102).  I talked to her gastroenterologist who felt that the year and  5-HIAA was likely to be more helpful.  This was normal.  ANA was negative and her thyroperoxidase antibody titer was ever so slightly elevated at 37 with a normal of 0-34.  I did not think this was relevant.  In the interim, she did notes that avoiding milk did help improve her symptoms.  She was no longer had gastrointestinal and flushing symptoms which she was experiencing before.  She felt quite a bit better avoiding all mammalian meat and dairy.   However, she did decide to go see the chiropractor in [place] and  had acupuncture to treat her alpha gal syndrome. She now tells me that she can eat some red meats, but it is more of a threshold issue. She is able to introduce some milk before any GI symptoms progress. She does have a lot of questions regarding the diagnosis and has several questions about tick bites as well.  She tells me that she is having a lot of joint pains is wondering whether this is related to her alpha gal syndrome.  She also tells me that she thinks she might need Lyme testing.  She was bit in March and then some other time over the summer.  During the summer, she was not sure how long it was embedded but thinks that it was several days.  She has been experiencing myalgias recently.  She tells me that she was bit by a tick and experienced similar symptoms about 1 year ago.  At that time, she did experience the bull's-eye lesion and she thinks she was treated.  Review of her labs showed that she did have negative Lyme test at that time.  She is very worried about the sequela of Lyme and would like to get the titers to make sure that she does not have evidence of it.  I did explain to her that I am no expert in Lyme at all but I could certainly order it and see what it shows.  Otherwise, there have been no changes to her past medical history, surgical history, family history, or social history. 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     Review of Systems  Constitutional: Negative.  Negative for chills, fever, malaise/fatigue and weight loss.  HENT: Negative.  Negative for congestion, ear discharge, ear pain and sore throat.   Eyes: Negative for pain, discharge and redness.  Respiratory: Negative for cough, sputum production, shortness of breath and wheezing.   Cardiovascular: Negative.  Negative for chest pain and palpitations.  Gastrointestinal: Positive for abdominal pain and nausea. Negative for constipation, diarrhea, heartburn and vomiting.  Musculoskeletal: Positive  for joint pain and myalgias.  Skin: Negative.  Negative for itching and rash.  Neurological: Positive for tingling. Negative for dizziness and headaches.  Endo/Heme/Allergies: Negative for environmental allergies. Does not bruise/bleed easily.       Objective:   Blood pressure 132/80, pulse 82, temperature 98.2 F (36.8 C), temperature source Temporal, resp. rate 18, height 5\' 6"  (1.676 m), weight 200 lb (90.7 kg), SpO2 98 %. Body mass index is 32.28 kg/m.   Physical Exam:  Physical Exam  Constitutional: She appears well-developed.  Pleasant female. Talkative.   HENT:  Head: Normocephalic and atraumatic.  Right Ear: Tympanic membrane, external ear and ear canal normal.  Left Ear: Tympanic membrane and ear canal normal.  Nose: No mucosal edema, rhinorrhea, nasal deformity or septal deviation. No epistaxis. Right sinus exhibits no maxillary sinus tenderness and no frontal sinus  tenderness. Left sinus exhibits no maxillary sinus tenderness and no frontal sinus tenderness.  Mouth/Throat: Uvula is midline and oropharynx is clear and moist. Mucous membranes are not pale and not dry.  Eyes: Pupils are equal, round, and reactive to light. Conjunctivae and EOM are normal. Right eye exhibits no chemosis and no discharge. Left eye exhibits no chemosis and no discharge. Right conjunctiva is not injected. Left conjunctiva is not injected.  Cardiovascular: Normal rate, regular rhythm and normal heart sounds.  Respiratory: Effort normal and breath sounds normal. No accessory muscle usage. No tachypnea. No respiratory distress. She has no wheezes. She has no rhonchi. She has no rales. She exhibits no tenderness.  Moving air well in all lung fields. No increased work of breathing noted.   Lymphadenopathy:    She has no cervical adenopathy.  Neurological: She is alert.  Skin: No abrasion, no petechiae and no rash noted. Rash is not papular, not vesicular and not urticarial. No erythema. No pallor.   No eczema. No dermatographism.   Psychiatric: She has a normal mood and affect.     Diagnostic studies: labs sent instead    Salvatore Marvel, MD  Allergy and [place] [facility] [place]

## 2019-05-31 LAB — LYME AB/WESTERN BLOT REFLEX
LYME DISEASE AB, QUANT, IGM: 0.87 index — ABNORMAL HIGH (ref 0.00–0.79)
Lyme IgG/IgM Ab: <0.91 {ISR} (ref 0.00–0.90)

## 2019-05-31 LAB — LYME, WESTERN BLOT, SERUM (REFLEXED)
IgG P18 Ab.: ABSENT
IgG P23 Ab.: ABSENT
IgG P28 Ab.: ABSENT
IgG P30 Ab.: ABSENT
IgG P39 Ab.: ABSENT
IgG P45 Ab.: ABSENT
IgG P58 Ab.: ABSENT
IgG P66 Ab.: ABSENT
IgG P93 Ab.: ABSENT
IgM P23 Ab.: ABSENT
IgM P39 Ab.: ABSENT
IgM P41 Ab.: ABSENT
Lyme IgG Wb: NEGATIVE
Lyme IgM Wb: NEGATIVE

## 2019-05-31 LAB — CHROMOGRANIN A: Chromogranin A (ng/mL): 135.9 ng/mL — ABNORMAL HIGH (ref 0.0–101.8)

## 2019-06-05 ENCOUNTER — Encounter: Payer: Self-pay | Admitting: Allergy & Immunology

## 2019-06-05 DIAGNOSIS — M791 Myalgia, unspecified site: Secondary | ICD-10-CM

## 2019-06-09 MED FILL — RABEPRAZOLE SOD DR 20 MG TA: 20 | 90 days supply | Qty: 90 | Fill #2

## 2019-06-09 NOTE — Telephone Encounter (Signed)
Can we put in a referral to Rheumatology, diagnosis myalgia (M79.10)? She has seen Dr. Dossie Der already, so please put with someone else.  Salvatore Marvel, MD Allergy and [place] [facility] [place]

## 2019-06-16 MED FILL — FLUTICASONE PROP 50 MCG SPR: 50 | 30 days supply | Qty: 16 | Fill #2

## 2019-07-07 ENCOUNTER — Encounter: Payer: Self-pay | Admitting: Nurse Practitioner

## 2019-07-08 ENCOUNTER — Telehealth: Payer: Self-pay | Admitting: Family Medicine

## 2019-07-08 MED ORDER — ALPRAZOLAM 1 MG PO TABS: ORAL_TABLET | ORAL | 2 refills | Status: DC

## 2019-07-08 MED FILL — ALPRAZolam 1 MG TABS: 1 | 30 days supply | Qty: 60 | Fill #0

## 2019-07-08 NOTE — Telephone Encounter (Signed)
Ok plus two monthly ref, write must last one month on it

## 2019-07-08 NOTE — Telephone Encounter (Signed)
Patient out of Xanax.  She sent a Pharmacist, community message but it was sent to [facility]. She also said that the pharmacy said they sent in a refill request twice but I don't see this in her chart.  [facility]

## 2019-07-08 NOTE — Telephone Encounter (Signed)
Prescription faxed to pharmacy. Patient notified. 

## 2019-07-12 ENCOUNTER — Other Ambulatory Visit: Payer: Self-pay | Admitting: Nurse Practitioner

## 2019-07-16 MED FILL — FLUTICASONE PROP 50 MCG SPR: 50 | 30 days supply | Qty: 16 | Fill #3

## 2019-07-24 ENCOUNTER — Encounter: Payer: Self-pay | Admitting: Nurse Practitioner

## 2019-07-25 ENCOUNTER — Other Ambulatory Visit: Payer: Self-pay | Admitting: Nurse Practitioner

## 2019-07-25 MED ORDER — ALPRAZOLAM 1 MG PO TABS: ORAL_TABLET | ORAL | 2 refills | Status: DC

## 2019-07-28 NOTE — Progress Notes (Signed)
Virtual Visit via Video Note  I connected with Theresa [name] on 07/29/19 at  9:45 AM EST by a video enabled telemedicine application and verified that I am speaking with the correct person using two identifiers.  Location: Patient: Home  Provider: Clinic  This service was conducted via virtual visit.  Both audio and visual tools were used.  The patient was located at home. I was located in my office.  Consent was obtained prior to the virtual visit and is aware of possible charges through their insurance for this visit.  The patient is an established patient.  Dr. Estanislado Pandy, MD conducted the virtual visit and Hazel Sams, PA-C acted as scribe during the service.  Office staff helped with scheduling follow up visits after the service was conducted.   I discussed the limitations of evaluation and management by telemedicine and the availability of in person appointments. The patient expressed understanding and agreed to proceed.  CC: History of Present Illness: Patient is a 48 year old female with a past medical history of chronic fatigue syndrome and IBS.  She has been seen in consultation per request of Dr. Ernst Bowler for evaluation of progression joint pain.  According to patient her symptoms started 17 years ago in the postpartum period.  She states she started developing a rash over the volar aspect of her wrist and over her ankles.  She was seen by dermatologist in the skin biopsy was negative for any particular disease.  She states since then she has had recurrent rash with joint pain which occurs every week or monthly basis.  She states the rash is mostly on her wrist and ankles now and it can be anywhere from the size of a dime to a half dollar.  The rash usually last for 3 to 4 days and then it resolves.  She states in the last month she has been experiencing increased joint pain.  She describes pain in her bilateral elbows, bilateral wrist joints, bilateral knee joints and ankle joints.  She has  noticed some swelling in her ankle joints intermittently.  She has been also suffering from dry eyes for last few years.  She has had lacrimal duct plugging without much relief.  She has been on Restasis.  She states she was evaluated by Dr. Dossie Der in 2016 and at the time no diagnosis was established.  She has intermittently had a positive ANA.  She was also diagnosed with chronic fatigue syndrome in 2016 after EBV infection.  She has history of IBS as well.  She believes that her paternal great aunt had systemic lupus dermatosis.  Review of Systems  Constitutional: Positive for malaise/fatigue. Negative for fever.  HENT:       Denies oral or nasal ulcerations  Denies mouth dryness  Eyes: Negative for photophobia, pain, discharge and redness.       +Dry eyes  Respiratory: Negative for cough, shortness of breath and wheezing.   Cardiovascular: Negative for chest pain and palpitations.  Gastrointestinal: Positive for constipation (IBS-C). Negative for blood in stool and diarrhea.  Genitourinary: Negative for dysuria.  Musculoskeletal: Positive for joint pain and myalgias. Negative for back pain and neck pain.       +Morning stiffness   Skin: Positive for rash.  Neurological: Negative for dizziness and headaches.  Psychiatric/Behavioral: Negative for depression. The patient has insomnia. The patient is not nervous/anxious.       Observations/Objective: Physical Exam  Constitutional: She is oriented to person, place, and time and  well-developed, well-nourished, and in no distress.  HENT:  Head: Normocephalic and atraumatic.  Eyes: Conjunctivae are normal.  Pulmonary/Chest: Effort normal.  Neurological: She is alert and oriented to person, place, and time.  Psychiatric: Mood, memory, affect and judgment normal.   Patient reports morning stiffness for 2 hours.   Patient reports nocturnal pain.  Difficulty dressing/grooming: Denies Difficulty climbing stairs: Reports Difficulty getting out  of chair: Reports Difficulty using hands for taps, buttons, cutlery, and/or writing: Reports   Component     Latest Ref Rng & Units 02/17/2019  Thyroperoxidase Ab SerPl-aCnc     0 - 34 IU/mL 37 (H)  Thyroglobulin Antibody     0.0 - 0.9 IU/mL <1.0  C3 Complement     82 - 167 mg/dL 121  Complement C4, Body Fluid     14 - 44 mg/dL 19  Anti Nuclear Antibody (ANA)     Negative Negative  RA Latex Turbid.     0.0 - 13.9 IU/mL <10.0  Sed Rate     0 - 22 mm/hr 12   Assessment and Plan: Diagnoses and all orders for this visit:  Rash- Patient gives history of intermittent rash for the last 17 years.  She states she has had biopsy in the past which was negative.  I have advised her to schedule an appointment with dermatologist again for evaluation.  Polyarthralgia- She complains of pain in multiple joints mostly involving her elbow joints, wrist joints, knee joints and ankle joints.  She also gives history of intermittent swelling in her ankle joints.  I will obtain AVISE labs.  Other fatigue- Patient gives history of fatigue.  She states she was diagnosed with chronic fatigue syndrome in 2016 after EBV infection.  She has severe fatigue.  Myalgia- She gives history of pain in all of her muscles and stiffness lasting almost all day.  We discussed possibility of fibromyalgia syndrome.  Need for regular exercise and sleep hygiene was discussed.  She also suffers from insomnia and takes Xanax for insomnia.  She states she has tried several medications and none of those were effective.  I also discussed possible referral to integrative therapies.  I plan to obtain CK, TSH and vitamin D levels at the follow-up visit as these will be not included in the [place] labs..  Sicca symptoms- She gives history of dry mouth and dry eyes for several years.  She has been seeing an ophthalmologist on a regular basis.  She is on Restasis and had lacrimal plugging without adequate results.  Other medical problems  are listed as follows:  Positive test for Epstein-Barr virus (EBV)  Separation of right acromioclavicular joint, subsequent encounter  Chronic idiopathic urticaria  History of gastroesophageal reflux (GERD)  Anxiety and depression  Hx of colonic polyps  Chronic rhinitis     Follow Up Instructions: She will follow up in the next few weeks after [facility] labs.   I discussed the assessment and treatment plan with the patient. The patient was provided an opportunity to ask questions and all were answered. The patient agreed with the plan and demonstrated an understanding of the instructions.   The patient was advised to call back or seek an in-person evaluation if the symptoms worsen or if the condition fails to improve as anticipated.  I provided 40 minutes of non-face-to-face time during this encounter.   Bo Merino, MD

## 2019-07-29 ENCOUNTER — Encounter: Payer: Self-pay | Admitting: Rheumatology

## 2019-07-29 ENCOUNTER — Other Ambulatory Visit: Payer: Self-pay

## 2019-07-29 ENCOUNTER — Telehealth (INDEPENDENT_AMBULATORY_CARE_PROVIDER_SITE_OTHER): Payer: No Typology Code available for payment source | Admitting: Rheumatology

## 2019-07-29 DIAGNOSIS — R21 Rash and other nonspecific skin eruption: Secondary | ICD-10-CM

## 2019-07-29 DIAGNOSIS — L501 Idiopathic urticaria: Secondary | ICD-10-CM

## 2019-07-29 DIAGNOSIS — M791 Myalgia, unspecified site: Secondary | ICD-10-CM

## 2019-07-29 DIAGNOSIS — S43101D Unspecified dislocation of right acromioclavicular joint, subsequent encounter: Secondary | ICD-10-CM

## 2019-07-29 DIAGNOSIS — J310 Chronic rhinitis: Secondary | ICD-10-CM

## 2019-07-29 DIAGNOSIS — M3500 Sicca syndrome, unspecified: Secondary | ICD-10-CM | POA: Diagnosis not present

## 2019-07-29 DIAGNOSIS — Z86010 Personal history of colonic polyps: Secondary | ICD-10-CM

## 2019-07-29 DIAGNOSIS — B2700 Gammaherpesviral mononucleosis without complication: Secondary | ICD-10-CM

## 2019-07-29 DIAGNOSIS — R894 Abnormal immunological findings in specimens from other organs, systems and tissues: Secondary | ICD-10-CM

## 2019-07-29 DIAGNOSIS — F32A Depression, unspecified: Secondary | ICD-10-CM

## 2019-07-29 DIAGNOSIS — M2550 Pain in unspecified joint: Secondary | ICD-10-CM | POA: Diagnosis not present

## 2019-07-29 DIAGNOSIS — Z8719 Personal history of other diseases of the digestive system: Secondary | ICD-10-CM

## 2019-07-29 DIAGNOSIS — F419 Anxiety disorder, unspecified: Secondary | ICD-10-CM

## 2019-07-29 DIAGNOSIS — R5383 Other fatigue: Secondary | ICD-10-CM | POA: Diagnosis not present

## 2019-07-29 DIAGNOSIS — F329 Major depressive disorder, single episode, unspecified: Secondary | ICD-10-CM

## 2019-07-30 ENCOUNTER — Encounter: Payer: Self-pay | Admitting: Nurse Practitioner

## 2019-07-30 ENCOUNTER — Other Ambulatory Visit: Payer: Self-pay | Admitting: Nurse Practitioner

## 2019-07-30 MED ORDER — ALPRAZOLAM 1 MG PO TABS: ORAL_TABLET | ORAL | 2 refills | Status: DC

## 2019-07-31 MED FILL — ALPRAZolam 1 MG TABS: 1 | 30 days supply | Qty: 90 | Fill #0

## 2019-08-12 ENCOUNTER — Ambulatory Visit (INDEPENDENT_AMBULATORY_CARE_PROVIDER_SITE_OTHER): Payer: Self-pay | Admitting: Internal Medicine

## 2019-08-13 ENCOUNTER — Encounter (INDEPENDENT_AMBULATORY_CARE_PROVIDER_SITE_OTHER): Payer: Self-pay

## 2019-08-13 ENCOUNTER — Other Ambulatory Visit (INDEPENDENT_AMBULATORY_CARE_PROVIDER_SITE_OTHER): Payer: Self-pay | Admitting: *Deleted

## 2019-08-13 MED ORDER — SUCRALFATE 1 GM/10ML PO SUSP: 1.0000 g | Freq: Four times a day (QID) | ORAL | 1 refills | Status: DC

## 2019-08-13 MED FILL — SUCRALFATE 1 GM/10ML SUSP: 1 | 10 days supply | Qty: 420 | Fill #0

## 2019-08-19 ENCOUNTER — Encounter: Payer: Self-pay | Admitting: Allergy & Immunology

## 2019-08-22 ENCOUNTER — Ambulatory Visit: Payer: No Typology Code available for payment source | Admitting: Rheumatology

## 2019-08-24 IMAGING — US ULTRASOUND RIGHT BREAST LIMITED
1 series · 6 of 6 positions shown · non-contrast
Comparison: Previous exam(s).

CLINICAL DATA: Ordering physician describes a palpable lump within
the RIGHT breast at the 9 o'clock axis.

[Series 1: ultrasound right breast limited · 0.08mm/px · 6 of 6 slices shown]
[im 1/6]
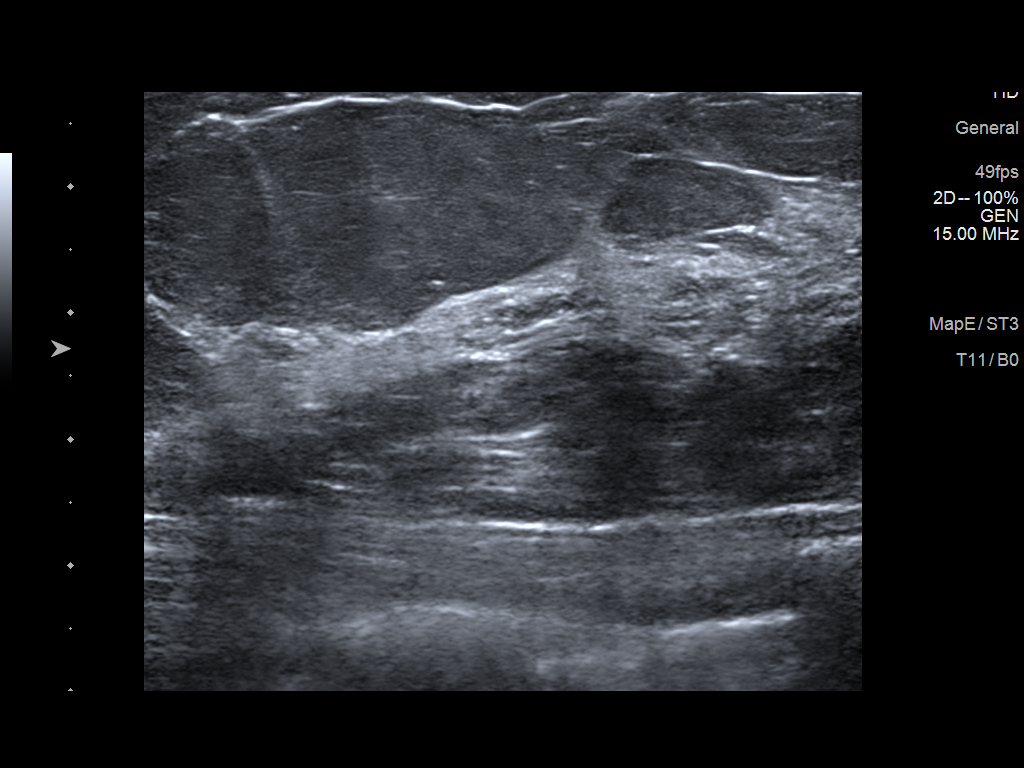
[im 2/6]
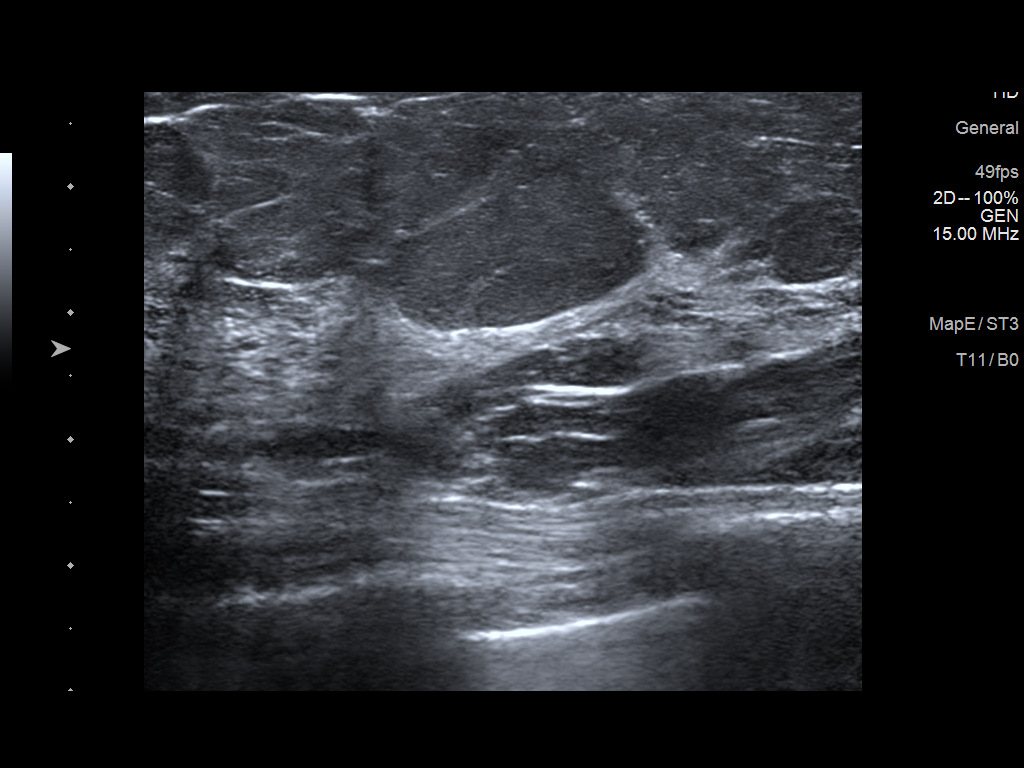
[im 3/6]
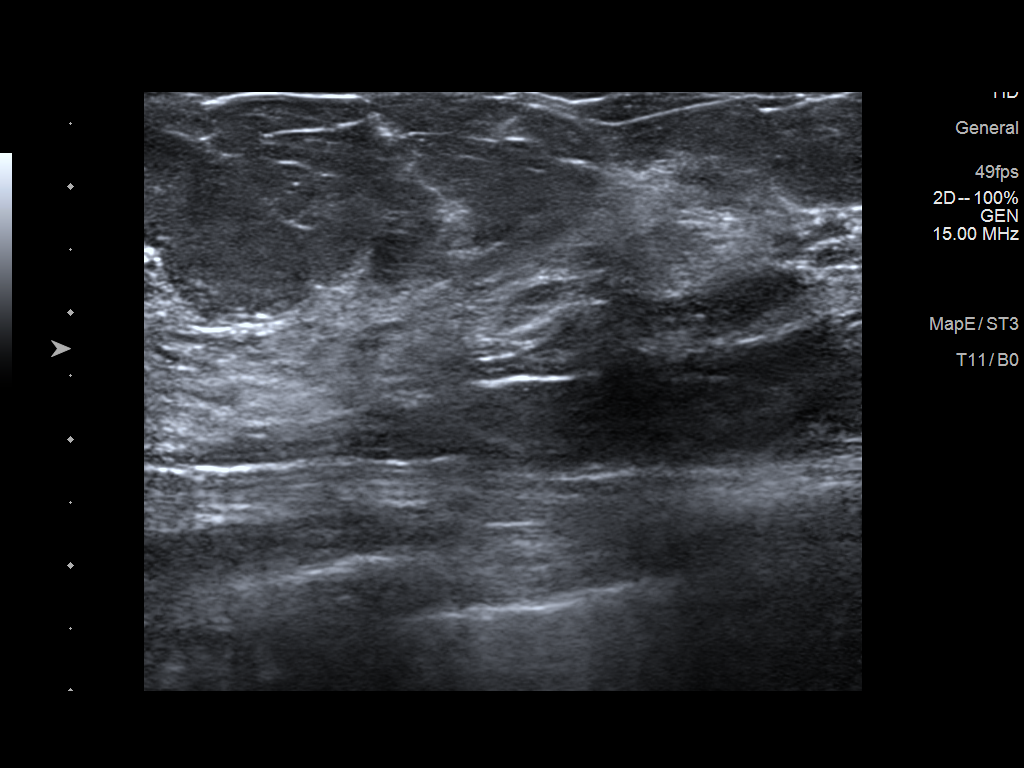
[im 4/6]
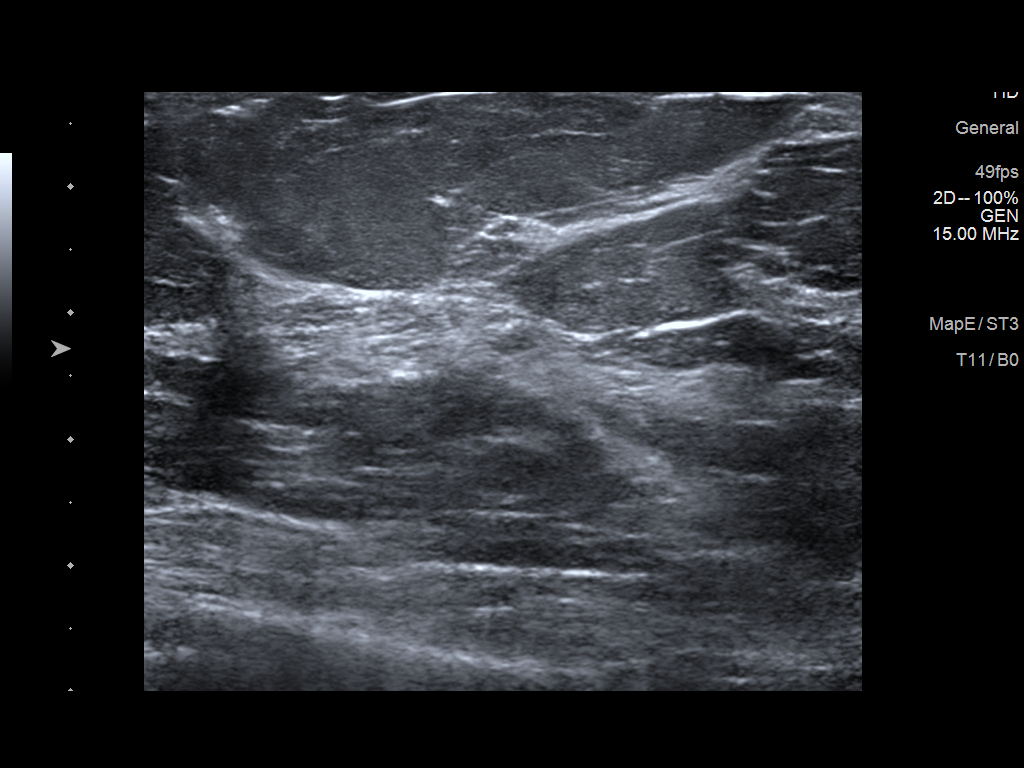
[im 5/6]
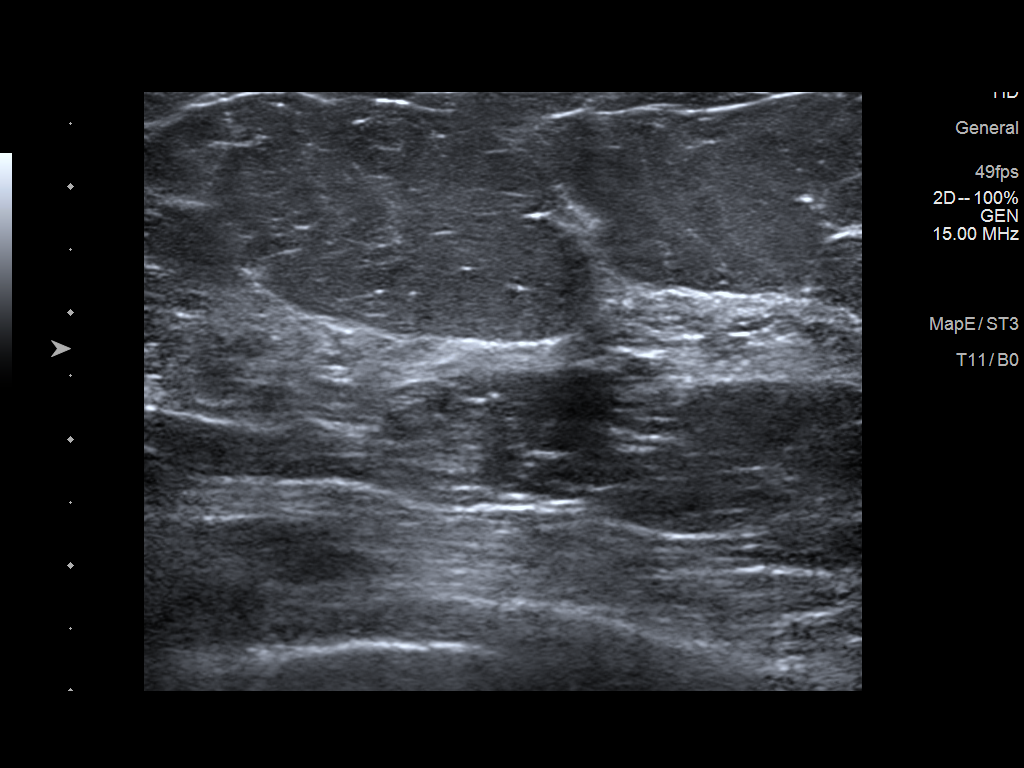
[im 6/6]
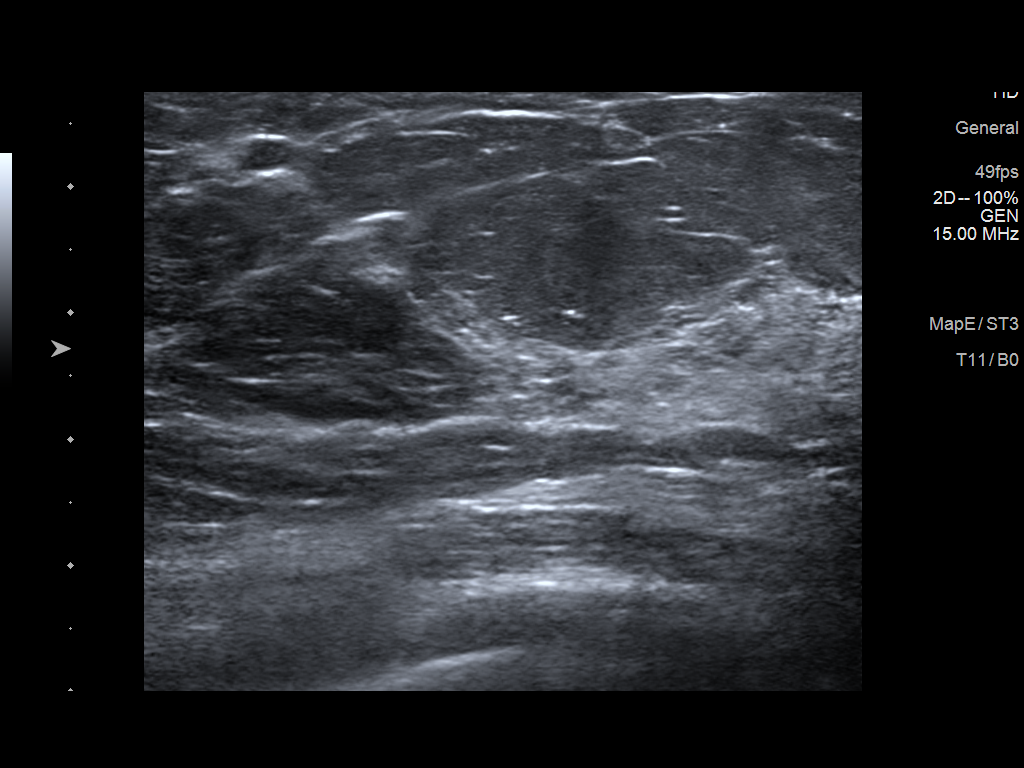

[6 of 6 positions shown; findings below may reference images not displayed]

Diagnostic mammogram report of 05/28/2015 described a probably
benign asymmetry within the far superior/far lateral LEFT breast for
which a follow-up diagnostic mammogram was recommended.

History of bilateral breast reduction in 9662.

EXAM:
DIGITAL DIAGNOSTIC BILATERAL MAMMOGRAM WITH CAD AND TOMO

ULTRASOUND RIGHT BREAST
ACR Breast Density Category b: There are scattered areas of
fibroglandular density.
FINDINGS: There are stable postsurgical changes within each breast. The
previously questioned asymmetry within the far superior/far lateral
left breast is stable compared to the previous study of 05/28/2015
indicating normal accessory fibroglandular tissues.

There are no new dominant masses, suspicious calcifications or
secondary signs of malignancy within either breast. Specifically,
there is no mammographic abnormality within the outer right breast
corresponding to the area of clinical concern.

Mammographic images were processed with CAD.

Targeted ultrasound is performed, evaluating the outer right breast,
showing only normal fibroglandular tissues and fat lobules
throughout. No solid or cystic mass. There is a ridge of normal
dense fibroglandular tissue at the 9 o'clock axis.
IMPRESSION: 1. No evidence of malignancy within either breast. Specifically, no
mammographic or sonographic evidence of malignancy within the outer
right breast. There is a ridge of normal dense fibroglandular tissue
at the 9 o'clock axis corresponding to the area of clinical concern.
2. Previously questioned asymmetry within the far superior/far
lateral left breast is stable indicating normal accessory
fibroglandular tissues.

RECOMMENDATION:
1.  Screening mammogram in one year.(Code:[id number])
2. Clinical follow-up recommended for the palpable area of concern
in the right breast. Any further workup should be based on clinical
grounds.

I have discussed the findings and recommendations with the patient.
Results were also provided in writing at the conclusion of the
visit. If applicable, a reminder letter will be sent to the patient
regarding the next appointment.

BI-RADS CATEGORY  2: Benign.

## 2019-08-25 DIAGNOSIS — R7303 Prediabetes: Secondary | ICD-10-CM | POA: Insufficient documentation

## 2019-08-27 MED FILL — valACYclovir HCL 1 GM TABS: 1 | 30 days supply | Qty: 30 | Fill #0

## 2019-08-27 MED FILL — FEMYNOR 0.25-35 MG-MCG TABS: 0.25-35 | 28 days supply | Qty: 28 | Fill #0

## 2019-09-08 ENCOUNTER — Encounter: Payer: Self-pay | Admitting: Allergy & Immunology

## 2019-09-10 ENCOUNTER — Ambulatory Visit (INDEPENDENT_AMBULATORY_CARE_PROVIDER_SITE_OTHER): Payer: No Typology Code available for payment source | Admitting: Allergy & Immunology

## 2019-09-10 ENCOUNTER — Other Ambulatory Visit: Payer: Self-pay

## 2019-09-10 ENCOUNTER — Encounter: Payer: Self-pay | Admitting: Allergy & Immunology

## 2019-09-10 VITALS — BP 132/84 | HR 91 | Temp 97.7°F | Resp 18

## 2019-09-10 DIAGNOSIS — J3089 Other allergic rhinitis: Secondary | ICD-10-CM

## 2019-09-10 DIAGNOSIS — T782XXD Anaphylactic shock, unspecified, subsequent encounter: Secondary | ICD-10-CM

## 2019-09-10 DIAGNOSIS — J302 Other seasonal allergic rhinitis: Secondary | ICD-10-CM | POA: Diagnosis not present

## 2019-09-10 MED ORDER — EPINEPHRINE 0.3 MG/0.3ML IJ SOAJ: 0.3000 mg | INTRAMUSCULAR | 0 refills | Status: DC | PRN

## 2019-09-10 NOTE — Progress Notes (Signed)
FOLLOW UP  Date of Service/Encounter:  09/10/19   Assessment:   Idiopathic anaphylaxis- with episodes of flushing/hives   Chronic rhinitis (cat, dog, grasses, molds, trees, ragweed, and weeds) - wishing to initiate allergen immunotherapy  Anaphylactic shock due to food- with a positive alpha gal panel and continued low intensity reactions (not great at avoiding exposures)  S/p chiropractor visit to treat alpha gal [facility])   Plan/Recommendations:   1. Idiopathic anaphylaxis - with a positive alpha gal  - Like we have mentioned, the course of alpha gal is relapsing and remitting.  - EpiPen is up to date.  2. Seasonal and perennial allergic rhinitis (grasses, weeds, ragweed, trees, molds, cat, dog) - Make an appointment to start allergy shots in two weeks. - You can get the shots at your work place after getting the first vials at our office. - Prednisone pack provided for quicker relief. - Increase the Flonase to twice daily. - Add on nasal ipratropium one spray per nostril every 8 hours as needed.  - Continue with Xyzal 5mg  daily (can increase to twice daily on bad days).   3. Return in about 6 months (around 03/09/2020). This can be an in-person, a virtual Webex or a telephone follow up visit.  Subjective:   Annagrace A Pokorny is a 49 y.o. female presenting today for follow up of  Chief Complaint  Patient presents with   Allergies    Runny Nose, Headache, Red Eyes that started around August     Jakeisha A Mossey has a history of the following: Patient Active Problem List   Diagnosis Date Noted   Separation of right acromioclavicular joint 02/17/2019   Concussion with loss of consciousness 02/17/2019   Idiopathic anaphylaxis 02/12/2019   Chronic rhinitis 02/12/2019   Chronic idiopathic urticaria 02/12/2019   Hx of colonic polyps 07/17/2018   Gastroesophageal reflux disease 07/17/2018   Positive test for Epstein-Barr virus (EBV) 08/21/2017   Anxiety  and depression 07/28/2017   Fatigue 07/12/2015   Arthralgia 07/12/2015   AP (abdominal pain) 07/06/2015   Obesity 01/14/2014   History of abnormal cervical Pap smear 12/02/2013   Internal hemorrhoids with other complication 0000000   Generalized anxiety disorder 12/25/2012   GERD (gastroesophageal reflux disease) 10/31/2011   GASTROESOPHAGEAL REFLUX DISEASE 07/13/2008   HEARTBURN 07/13/2008   CONSTIPATION 05/13/2008   BLOOD IN STOOL 05/13/2008   CHANGE IN BOWELS 05/13/2008    History obtained from: chart review and patient.  Gerard is a 49 y.o. female presenting for a sick visit.  We last saw her in October 2020.  She has a history of idiopathic anaphylaxis with intermittent flushing and hives episodes.  She did have a positive alpha gal panel, but she has never truly avoided it.  After receiving the diagnosis of alpha gal, she did go see a chiropractor in [place] who performed acupuncture.  However, unsurprisingly, she continues to have reactions to mammalian meat.  She does have a history of chronic rhinitis as well, although we have never truly worked this up aside from an environmental allergy panel.  Since last visit, she has been stable from an idiopathic anaphylaxis perspective.  Her main complaint today is regarding her chronic rhinitis.  She reports that her environmental allergies have been quite terrible over the last year.  She has wanting to be more aggressive.  Allergic Rhinitis Symptom History: She continues to have some chronic allergic rhinitis. She feels like "shit" every day and mostly deals with it. She  has been tested for COVID six times, thankfully all negative. Monday was the worst day, likely secondary to contruction at her work place place. She knows that she has severe rections to molds. On Monday, she was at work for a little over one hour. 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 She did have a tickle in her throat and a  constant urge to sneeze in her left nostril. She is on Flonase now and has tried azelastine. She was placed on antibiotics in the past with transient improvement. She never gets antibiotics from Dr. Wolfgang Phoenix. She did receive steroid and Rocephin injections routinely with Dr. Luan Pulling.   She had an environmental allergy panel that was positive to cat, dog, grasses, molds, trees, weeds, and ragweed from blood work done in July 2020.  She continues to eat red meat occasionally.  She does have occasional "mini reactions" to the red meat.  Her EpiPen is up-to-date.  Otherwise, there have been no changes to her past medical history, surgical history, family history, or social history.    Review of Systems  Constitutional: Negative.  Negative for fever, malaise/fatigue and weight loss.  HENT: Positive for congestion and sinus pain. Negative for ear discharge and ear pain.        Positive for postnasal drip.  Positive for throat clearing.  Eyes: Negative for pain, discharge and redness.  Respiratory: Negative for cough, sputum production, shortness of breath and wheezing.   Cardiovascular: Negative.  Negative for chest pain and palpitations.  Gastrointestinal: Negative for abdominal pain, constipation, diarrhea, heartburn, nausea and vomiting.  Skin: Negative.  Negative for itching and rash.  Neurological: Negative for dizziness and headaches.  Endo/Heme/Allergies: Negative for environmental allergies. Does not bruise/bleed easily.       Objective:   Blood pressure 132/84, pulse 91, temperature 97.7 F (36.5 C), resp. rate 18, SpO2 96 %. There is no height or weight on file to calculate BMI.   Physical Exam:  Physical Exam  Constitutional: She appears well-developed.  Pleasant.  Very talkative.  HENT:  Head: Normocephalic and atraumatic.  Right Ear: Tympanic membrane, external ear and ear canal normal.  Left Ear: Tympanic membrane, external ear and ear canal normal.  Nose: Mucosal edema  and rhinorrhea present. No nasal deformity or septal deviation. No epistaxis. Right sinus exhibits maxillary sinus tenderness. Right sinus exhibits no frontal sinus tenderness. Left sinus exhibits maxillary sinus tenderness. Left sinus exhibits no frontal sinus tenderness.  Mouth/Throat: Uvula is midline and oropharynx is clear and moist. Mucous membranes are not pale and not dry.  Moderate cobblestoning of posterior oropharynx.  Tonsils unremarkable.  There is some bilateral maxillary sinus tenderness.  Eyes: Pupils are equal, round, and reactive to light. Conjunctivae and EOM are normal. Right eye exhibits no chemosis and no discharge. Left eye exhibits no chemosis and no discharge. Right conjunctiva is not injected. Left conjunctiva is not injected.  Allergic shiners bilaterally.  Cardiovascular: Normal rate, regular rhythm and normal heart sounds.  Respiratory: Effort normal and breath sounds normal. No accessory muscle usage. No tachypnea. No respiratory distress. She has no wheezes. She has no rhonchi. She has no rales. She exhibits no tenderness.  Moving air well in all lung fields.  No increased work of breathing.  Lymphadenopathy:    She has no cervical adenopathy.  Neurological: She is alert.  Skin: No abrasion, no petechiae and no rash noted. Rash is not papular, not vesicular and not urticarial. No erythema.  No pallor.  No eczematous or urticarial lesions noted.  No dermatographia some.  Psychiatric: She has a normal mood and affect.     Diagnostic studies: none     Salvatore Marvel, MD  [facility] and [facility]

## 2019-09-10 NOTE — Patient Instructions (Addendum)
1. Idiopathic anaphylaxis - with a positive alpha gal  - Like we have mentioned, the course of alpha gal is relapsing and remitting.  - EpiPen is up to date.  2. Seasonal and perennial allergic rhinitis (grasses, weeds, ragweed, trees, molds, cat, dog) - Make an appointment to start allergy shots in two weeks. - You can get the shots at your work place after getting the first vials at our office. - Prednisone pack provided for quicker relief. - Increase the Flonase to twice daily. - Add on nasal ipratropium one spray per nostril every 8 hours as needed.  - Continue with Xyzal 5mg  daily (can increase to twice daily on bad days).   3. Return in about 6 months (around 03/09/2020). This can be an in-person, a virtual Webex or a telephone follow up visit.   Please inform us of any Emergency Department visits, hospitalizations, or changes in symptoms. Call us before going to the ED for breathing or allergy symptoms since we might be able to fit you in for a sick visit. Feel free to contact us anytime with any questions, problems, or concerns.  It was a pleasure to see you again today! Thank you for taking such excellent care of our mutual patients!   Websites that have reliable patient information: 1. American Academy of Asthma, Allergy, and Immunology: www.aaaai.org 2. Food Allergy Research and Education (FARE): foodallergy.org 3. Mothers of Asthmatics: http://www.asthmacommunitynetwork.org 4. American College of Allergy, Asthma, and Immunology: www.acaai.org   COVID-19 Vaccine Information can be found at: ShippingScam.co.uk For questions related to vaccine distribution or appointments, please email vaccine@[email or web address] .com or call [phone].     "Like" [place] on Facebook and Instagram for our latest updates!        Make sure you are registered to vote! If you have moved or changed any of your contact information, you will need to  get this updated before voting!  In some cases, you MAY be able to register to vote online: [email or web address]

## 2019-09-11 ENCOUNTER — Encounter: Payer: Self-pay | Admitting: Allergy & Immunology

## 2019-09-11 DIAGNOSIS — J3081 Allergic rhinitis due to animal (cat) (dog) hair and dander: Secondary | ICD-10-CM

## 2019-09-11 NOTE — Progress Notes (Signed)
VIALS EXP 07-21-21 

## 2019-09-12 MED ORDER — IPRATROPIUM BROMIDE 0.06 % NA SOLN: 2.0000 | Freq: Three times a day (TID) | NASAL | 5 refills | Status: DC

## 2019-09-12 MED FILL — IPRATROPIUM 0.06% SPRAY: 0.06 | 14 days supply | Qty: 15 | Fill #0

## 2019-09-15 DIAGNOSIS — J301 Allergic rhinitis due to pollen: Secondary | ICD-10-CM | POA: Diagnosis not present

## 2019-09-17 ENCOUNTER — Encounter: Payer: Self-pay | Admitting: Family Medicine

## 2019-09-22 ENCOUNTER — Other Ambulatory Visit: Payer: Self-pay | Admitting: Family Medicine

## 2019-09-23 ENCOUNTER — Other Ambulatory Visit (INDEPENDENT_AMBULATORY_CARE_PROVIDER_SITE_OTHER): Payer: Self-pay | Admitting: Internal Medicine

## 2019-09-23 ENCOUNTER — Other Ambulatory Visit: Payer: Self-pay | Admitting: Family Medicine

## 2019-09-23 DIAGNOSIS — K219 Gastro-esophageal reflux disease without esophagitis: Secondary | ICD-10-CM

## 2019-09-23 MED ORDER — CITALOPRAM HYDROBROMIDE 40 MG PO TABS: 40.0000 mg | ORAL_TABLET | Freq: Every day | ORAL | 0 refills | Status: DC

## 2019-09-23 MED ORDER — RABEPRAZOLE SODIUM 20 MG PO TBEC: 20.0000 mg | DELAYED_RELEASE_TABLET | Freq: Every day | ORAL | 1 refills | Status: DC

## 2019-09-23 MED FILL — RABEPRAZOLE SOD DR 20 MG TA: 20 | 90 days supply | Qty: 90 | Fill #0

## 2019-09-23 MED FILL — CITALOPRAM HBR 40 MG TABLET: 40 | 90 days supply | Qty: 90 | Fill #0

## 2019-09-23 MED FILL — IPRATROPIUM 0.06% SPRAY: 0.06 | 13 days supply | Qty: 15 | Fill #0 | Status: TO

## 2019-09-23 NOTE — Progress Notes (Signed)
Prescription for Requip resolved sent to: Pharmacy for 90 days with 1 refill. Theresa [name] will need office visit prior to next refill or in 6 months..  Last visit was more than a year ago

## 2019-09-24 ENCOUNTER — Telehealth: Payer: Self-pay

## 2019-09-24 ENCOUNTER — Encounter: Payer: Self-pay | Admitting: Allergy & Immunology

## 2019-09-24 ENCOUNTER — Ambulatory Visit (INDEPENDENT_AMBULATORY_CARE_PROVIDER_SITE_OTHER): Payer: No Typology Code available for payment source

## 2019-09-24 DIAGNOSIS — J309 Allergic rhinitis, unspecified: Secondary | ICD-10-CM | POA: Diagnosis not present

## 2019-09-24 NOTE — Telephone Encounter (Signed)
First follow up call to patient following systemic reaction due to allergy injection:   Patient says that her cough is more intermittent now and she is more exhausted than anything. She told me that it did take awhile for the epinephrine to wear off. She told me that she finally found a way to describe how she felt earlier with the reaction. She told me that she felt like she had been given Versed.  We will need to place another follow up call to the patient tomorrow.

## 2019-09-24 NOTE — Progress Notes (Signed)
Immunotherapy   Patient Details  Name: Theresa [name] MRN: [id number] Date of Birth: Jun 27, 1971  09/24/2019  [name] Started allergy shots waited 30 minutes and had a reaction to to the injections. She said her head felt foggy and dizzy. She took Xyzal in the office, she then was taken into an examine room and there she was given Epi .3 mL vitals taken, blood pressure started off 120/78 then it went up. Her heart rate was holding steady around 70-78, respirations held around 18-20, Oxygen level held around 99-100%. She felt like this for about 30 to 45 minutes, she was give 40 mg of prednisone but didn't feel any better so a second dose of Epi .3 mL was given. After another 30 minute wait she began to feel better. Her husband was notified and he came to pick her up because she didn't feel comfortable driving. Her vial were backed down to silver. She was gong to take the vials out to get then done at work but she didn't feel comfortable after having a reaction in the office so she will continue to come in the office to get allergy injections.  Following schedule: A Frequency: Weekly Epi-Pen:Yes  Consent signed and patient instructions given.   Isabel Caprice 09/24/2019, 4:36 PM

## 2019-09-25 ENCOUNTER — Other Ambulatory Visit: Payer: Self-pay | Admitting: *Deleted

## 2019-09-25 ENCOUNTER — Encounter: Payer: Self-pay | Admitting: Allergy & Immunology

## 2019-09-25 MED ORDER — LEVOCETIRIZINE DIHYDROCHLORIDE 5 MG PO TABS: 5.0000 mg | ORAL_TABLET | Freq: Every evening | ORAL | 5 refills | Status: DC

## 2019-09-25 MED FILL — LEVOCETIRIZINE 5 MG TABLET: 5 | 30 days supply | Qty: 30 | Fill #0

## 2019-09-25 NOTE — Telephone Encounter (Signed)
Noted - thank you for the update!   Salvatore Marvel, MD Allergy and [facility] [place]

## 2019-09-25 NOTE — Telephone Encounter (Signed)
Patient called and wanted Dr. Ernst Bowler to be aware that she is going to be off every Wednesday for her allergy injections.   Asked patient how she was feeling today, she states she is not feeling very well. She feels weak and tired.

## 2019-09-29 ENCOUNTER — Other Ambulatory Visit (INDEPENDENT_AMBULATORY_CARE_PROVIDER_SITE_OTHER): Payer: Self-pay | Admitting: Internal Medicine

## 2019-09-29 ENCOUNTER — Encounter (INDEPENDENT_AMBULATORY_CARE_PROVIDER_SITE_OTHER): Payer: Self-pay | Admitting: Internal Medicine

## 2019-09-29 ENCOUNTER — Other Ambulatory Visit (HOSPITAL_COMMUNITY)
Admission: RE | Admit: 2019-09-29 | Discharge: 2019-09-29 | Disposition: A | Discharge status: Home / Self Care | Payer: No Typology Code available for payment source | Source: Ambulatory Visit | Attending: Internal Medicine | Admitting: Internal Medicine

## 2019-09-29 DIAGNOSIS — K588 Other irritable bowel syndrome: Secondary | ICD-10-CM | POA: Insufficient documentation

## 2019-09-30 LAB — CHROMOGRANIN A: Chromogranin A (ng/mL): 486.6 ng/mL — ABNORMAL HIGH (ref 0.0–101.8)

## 2019-09-30 MED FILL — ALPRAZolam 1 MG TABS: 1 | 30 days supply | Qty: 90 | Fill #1

## 2019-10-01 ENCOUNTER — Ambulatory Visit (INDEPENDENT_AMBULATORY_CARE_PROVIDER_SITE_OTHER): Payer: No Typology Code available for payment source

## 2019-10-01 ENCOUNTER — Other Ambulatory Visit: Payer: Self-pay

## 2019-10-01 DIAGNOSIS — J309 Allergic rhinitis, unspecified: Secondary | ICD-10-CM | POA: Diagnosis not present

## 2019-10-03 ENCOUNTER — Other Ambulatory Visit (INDEPENDENT_AMBULATORY_CARE_PROVIDER_SITE_OTHER): Payer: Self-pay | Admitting: *Deleted

## 2019-10-03 DIAGNOSIS — K588 Other irritable bowel syndrome: Secondary | ICD-10-CM

## 2019-10-08 ENCOUNTER — Ambulatory Visit (INDEPENDENT_AMBULATORY_CARE_PROVIDER_SITE_OTHER): Payer: No Typology Code available for payment source

## 2019-10-08 ENCOUNTER — Other Ambulatory Visit (INDEPENDENT_AMBULATORY_CARE_PROVIDER_SITE_OTHER): Payer: Self-pay | Admitting: *Deleted

## 2019-10-08 DIAGNOSIS — K588 Other irritable bowel syndrome: Secondary | ICD-10-CM

## 2019-10-08 DIAGNOSIS — J309 Allergic rhinitis, unspecified: Secondary | ICD-10-CM

## 2019-10-09 MED FILL — FLUTICASONE PROP 50 MCG SPR: 50 | 30 days supply | Qty: 16 | Fill #0

## 2019-10-09 MED FILL — IPRATROPIUM 0.06% SPRAY: 0.06 | 14 days supply | Qty: 15 | Fill #1

## 2019-10-14 ENCOUNTER — Other Ambulatory Visit: Payer: Self-pay

## 2019-10-14 ENCOUNTER — Encounter (INDEPENDENT_AMBULATORY_CARE_PROVIDER_SITE_OTHER): Payer: Self-pay | Admitting: Internal Medicine

## 2019-10-14 ENCOUNTER — Ambulatory Visit (INDEPENDENT_AMBULATORY_CARE_PROVIDER_SITE_OTHER): Payer: No Typology Code available for payment source | Admitting: Internal Medicine

## 2019-10-14 VITALS — Ht 66.0 in | Wt 207.0 lb

## 2019-10-14 DIAGNOSIS — K219 Gastro-esophageal reflux disease without esophagitis: Secondary | ICD-10-CM

## 2019-10-14 DIAGNOSIS — K5900 Constipation, unspecified: Secondary | ICD-10-CM

## 2019-10-14 NOTE — Progress Notes (Addendum)
Virtual Visit via Telephone Note  I connected with Theresa [name] on 10/14/19 at  8:45 AM EST by telephone and verified that I am speaking with the correct person using two identifiers.  Location: Patient: at work Provider: office   I discussed the limitations, risks, security and privacy concerns of performing an evaluation and management service by telephone and the availability of in person appointments. I also discussed with the patient that there may be a patient responsible charge related to this service. The patient expressed understanding and agreed to proceed.   History of Present Illness:  Patient 49 year old Caucasian female with history of GERD and constipation who also has history of elevated chromogranin A level.  Her level was 499.27 months ago.  3 months later it dropped to 135.9 and on 09/29/2019 it was back up to 486.6.(Normal range is 0-101.8).  Patient is scheduled to have this repeated later this month. She says she is doing reasonably well as far as heartburn is concerned.  3 omeprazole is working well.  She may have heartburn no more than once or twice a week triggered by certain foods.  He uses sucralfate no more than twice a month.  She also has some difficulty swallowing foods.  She feels it may be related to allergy.  She is getting allergy shots under supervision of Dr. Ernst Bowler.  She has other shot on 09/24/2019 and had to be treated with epi x2 and single dose of prednisone.  She is receiving lesser dose and has not had recurrence of the symptoms. Lately she has been constipated.  She generally has 1 spontaneous bowel movement per week.  Since she has been using Colace her stools have been small caliber and somewhat stopped.  If she does not take stool softener her stools are hard and dry.  She denies melena or rectal bleeding.  She has had a history of diarrhea in the past but not lately.  She had colonoscopy in December 2019.  She had single small sessile serrated polyp  removed.  She is due for surveillance colonoscopy in December 2024. She states she drinks a lot of water. She says she is trying very hard to wean herself off alprazolam.  This medication was started when she lost her mother in 2011. She has not had any wheezing flushing of her rash.    Observations/Objective:  Patient reported her weight to be 207 pounds.  Assessment and Plan:  #1.  GERD.  Patient is doing well with therapy.  She is experiencing sporadic dysphagia and she wonders if it is related to her allergy.  She did have EGD and December 2019 and did not have any endoscopic changes suggestive of eosinophilic esophagitis.  #2.  Chronic constipation.  She has remote history of diarrhea.  She may have irritable bowel syndrome.  She will try Dulcolax or glycerin suppository every third day as needed.  If she remains with constipation would consider prucalopride.  #3.  Elevated chromogranin A level.  She does not have any symptoms suggest carcinoid.  Chromogranin A level has been elevated in the past with decreased to just above normal few months later.  I wonder if it was somehow related to her idiopathic urticaria.  Repeat level is planned in 2 weeks.  If it remains elevated we will proceed with further work-up starting with chest and abdominal pelvic CT.  Follow Up Instructions:  Chromogranin A level in 2 weeks. Office visit in 1 year.  I discussed the assessment and  treatment plan with the patient. The patient was provided an opportunity to ask questions and all were answered. The patient agreed with the plan and demonstrated an understanding of the instructions.   The patient was advised to call back or seek an in-person evaluation if the symptoms worsen or if the condition fails to improve as anticipated.  I provided 13 minutes of non-face-to-face time during this encounter.   Hildred Laser, MD

## 2019-10-15 ENCOUNTER — Ambulatory Visit (INDEPENDENT_AMBULATORY_CARE_PROVIDER_SITE_OTHER): Payer: No Typology Code available for payment source

## 2019-10-15 DIAGNOSIS — J309 Allergic rhinitis, unspecified: Secondary | ICD-10-CM

## 2019-10-21 MED FILL — SUCRALFATE 1 GM/10ML SUSP: 1 | 10 days supply | Qty: 420 | Fill #0

## 2019-10-22 ENCOUNTER — Other Ambulatory Visit (INDEPENDENT_AMBULATORY_CARE_PROVIDER_SITE_OTHER): Payer: Self-pay | Admitting: Internal Medicine

## 2019-10-22 ENCOUNTER — Ambulatory Visit (INDEPENDENT_AMBULATORY_CARE_PROVIDER_SITE_OTHER): Payer: No Typology Code available for payment source

## 2019-10-22 DIAGNOSIS — J309 Allergic rhinitis, unspecified: Secondary | ICD-10-CM

## 2019-10-22 MED ORDER — FAMOTIDINE 40 MG PO TABS: 40.0000 mg | ORAL_TABLET | Freq: Two times a day (BID) | ORAL | 0 refills | Status: DC

## 2019-10-22 MED FILL — FAMOTIDINE 40 MG TABLET: 40 | 30 days supply | Qty: 60 | Fill #0

## 2019-10-22 NOTE — Progress Notes (Signed)
Patient is having frequent heartburn regurgitation and emesis. PPI is on hold for another week until she can have follow-up chromogranin A level. We will treat patient with famotidine 40 mg p.o. twice daily while PPI is on hold. Scription sent to patient's pharmacy.

## 2019-10-24 ENCOUNTER — Other Ambulatory Visit: Payer: Self-pay | Admitting: Nurse Practitioner

## 2019-10-24 MED FILL — LEVOCETIRIZINE 5 MG TABLET: 5 | 30 days supply | Qty: 30 | Fill #1

## 2019-10-27 ENCOUNTER — Other Ambulatory Visit (HOSPITAL_COMMUNITY)
Admission: RE | Admit: 2019-10-27 | Discharge: 2019-10-27 | Disposition: A | Discharge status: Home / Self Care | Payer: No Typology Code available for payment source | Source: Ambulatory Visit | Attending: Internal Medicine | Admitting: Internal Medicine

## 2019-10-27 ENCOUNTER — Encounter: Payer: Self-pay | Admitting: Nurse Practitioner

## 2019-10-27 DIAGNOSIS — K588 Other irritable bowel syndrome: Secondary | ICD-10-CM | POA: Insufficient documentation

## 2019-10-27 MED FILL — ALPRAZolam 1 MG TABS: 1 | 30 days supply | Qty: 90 | Fill #0

## 2019-10-28 LAB — CHROMOGRANIN A: Chromogranin A (ng/mL): 119.3 ng/mL — ABNORMAL HIGH (ref 0.0–101.8)

## 2019-10-29 ENCOUNTER — Ambulatory Visit (INDEPENDENT_AMBULATORY_CARE_PROVIDER_SITE_OTHER): Payer: No Typology Code available for payment source

## 2019-10-29 DIAGNOSIS — J309 Allergic rhinitis, unspecified: Secondary | ICD-10-CM | POA: Diagnosis not present

## 2019-10-30 ENCOUNTER — Other Ambulatory Visit (INDEPENDENT_AMBULATORY_CARE_PROVIDER_SITE_OTHER): Payer: Self-pay | Admitting: *Deleted

## 2019-10-30 DIAGNOSIS — R1084 Generalized abdominal pain: Secondary | ICD-10-CM

## 2019-10-30 DIAGNOSIS — K219 Gastro-esophageal reflux disease without esophagitis: Secondary | ICD-10-CM

## 2019-10-30 DIAGNOSIS — K588 Other irritable bowel syndrome: Secondary | ICD-10-CM

## 2019-11-05 ENCOUNTER — Other Ambulatory Visit: Payer: Self-pay | Admitting: Nurse Practitioner

## 2019-11-05 ENCOUNTER — Encounter: Payer: Self-pay | Admitting: Nurse Practitioner

## 2019-11-05 ENCOUNTER — Ambulatory Visit (INDEPENDENT_AMBULATORY_CARE_PROVIDER_SITE_OTHER): Payer: No Typology Code available for payment source

## 2019-11-05 DIAGNOSIS — J309 Allergic rhinitis, unspecified: Secondary | ICD-10-CM

## 2019-11-05 DIAGNOSIS — Z1283 Encounter for screening for malignant neoplasm of skin: Secondary | ICD-10-CM

## 2019-11-05 DIAGNOSIS — D229 Melanocytic nevi, unspecified: Secondary | ICD-10-CM

## 2019-11-11 ENCOUNTER — Encounter: Payer: Self-pay | Admitting: Family Medicine

## 2019-11-12 ENCOUNTER — Ambulatory Visit (INDEPENDENT_AMBULATORY_CARE_PROVIDER_SITE_OTHER): Payer: No Typology Code available for payment source

## 2019-11-12 DIAGNOSIS — J309 Allergic rhinitis, unspecified: Secondary | ICD-10-CM

## 2019-11-19 ENCOUNTER — Telehealth: Payer: Self-pay | Admitting: Allergy & Immunology

## 2019-11-19 MED ORDER — PREDNISONE 10 MG PO TABS: ORAL_TABLET | ORAL | 0 refills | Status: DC

## 2019-11-19 MED ORDER — AMOXICILLIN-POT CLAVULANATE 875-125 MG PO TABS: 1.0000 | ORAL_TABLET | Freq: Two times a day (BID) | ORAL | 0 refills | Status: AC

## 2019-11-19 NOTE — Telephone Encounter (Signed)
Patient has had several days of worsening sinus congestion, pressure, and postnasal drip.  Therefore, we sent in a course of Augmentin in combination with a prednisone taper.  Confirmed pharmacy with the patient.  She will get her shot on Friday instead of today.  Salvatore Marvel, MD [facility]

## 2019-11-19 NOTE — Telephone Encounter (Signed)
Noted thanks °

## 2019-11-21 ENCOUNTER — Ambulatory Visit (INDEPENDENT_AMBULATORY_CARE_PROVIDER_SITE_OTHER): Payer: No Typology Code available for payment source

## 2019-11-21 DIAGNOSIS — J309 Allergic rhinitis, unspecified: Secondary | ICD-10-CM

## 2019-11-26 ENCOUNTER — Ambulatory Visit (INDEPENDENT_AMBULATORY_CARE_PROVIDER_SITE_OTHER): Payer: No Typology Code available for payment source

## 2019-11-26 DIAGNOSIS — J309 Allergic rhinitis, unspecified: Secondary | ICD-10-CM

## 2019-11-28 ENCOUNTER — Encounter: Payer: Self-pay | Admitting: Nurse Practitioner

## 2019-11-28 MED FILL — ALPRAZolam 1 MG TABS: 1 | 30 days supply | Qty: 90 | Fill #1

## 2019-11-28 MED FILL — LEVOCETIRIZINE 5 MG TABLET: 5 | 30 days supply | Qty: 30 | Fill #2

## 2019-12-01 ENCOUNTER — Telehealth: Payer: Self-pay | Admitting: *Deleted

## 2019-12-01 DIAGNOSIS — R4586 Emotional lability: Secondary | ICD-10-CM

## 2019-12-01 DIAGNOSIS — R5383 Other fatigue: Secondary | ICD-10-CM

## 2019-12-01 DIAGNOSIS — N951 Menopausal and female climacteric states: Secondary | ICD-10-CM

## 2019-12-01 NOTE — Telephone Encounter (Signed)
Please forward to the message nurse.  For this patient please order:  Estradiol level  [place]  Vitamin D  Diagnoses:  Fatigue  Perimenopause  Mood swings

## 2019-12-01 NOTE — Telephone Encounter (Signed)
Lab orders placed. Attempted to call patient but no voicemail set up. Sent my chart message to patient .

## 2019-12-03 ENCOUNTER — Ambulatory Visit (INDEPENDENT_AMBULATORY_CARE_PROVIDER_SITE_OTHER): Payer: No Typology Code available for payment source

## 2019-12-03 DIAGNOSIS — J309 Allergic rhinitis, unspecified: Secondary | ICD-10-CM | POA: Diagnosis not present

## 2019-12-04 LAB — ESTRADIOL: Estradiol: 10.4 pg/mL

## 2019-12-04 LAB — VITAMIN D 25 HYDROXY (VIT D DEFICIENCY, FRACTURES): Vit D, 25-Hydroxy: 37.2 ng/mL (ref 30.0–100.0)

## 2019-12-04 LAB — FOLLICLE STIMULATING HORMONE: FSH: 6.6 m[IU]/mL

## 2019-12-04 MED FILL — FLUTICASONE PROP 50 MCG SPR: 50 | 30 days supply | Qty: 16 | Fill #1

## 2019-12-08 ENCOUNTER — Encounter: Payer: Self-pay | Admitting: Nurse Practitioner

## 2019-12-10 ENCOUNTER — Ambulatory Visit (INDEPENDENT_AMBULATORY_CARE_PROVIDER_SITE_OTHER): Payer: No Typology Code available for payment source

## 2019-12-10 DIAGNOSIS — J309 Allergic rhinitis, unspecified: Secondary | ICD-10-CM | POA: Diagnosis not present

## 2019-12-17 ENCOUNTER — Ambulatory Visit (INDEPENDENT_AMBULATORY_CARE_PROVIDER_SITE_OTHER): Payer: No Typology Code available for payment source

## 2019-12-17 DIAGNOSIS — J309 Allergic rhinitis, unspecified: Secondary | ICD-10-CM | POA: Diagnosis not present

## 2019-12-24 ENCOUNTER — Ambulatory Visit (INDEPENDENT_AMBULATORY_CARE_PROVIDER_SITE_OTHER): Payer: No Typology Code available for payment source

## 2019-12-24 DIAGNOSIS — J309 Allergic rhinitis, unspecified: Secondary | ICD-10-CM

## 2019-12-26 ENCOUNTER — Ambulatory Visit: Payer: No Typology Code available for payment source | Admitting: Nurse Practitioner

## 2019-12-26 ENCOUNTER — Other Ambulatory Visit: Payer: Self-pay

## 2019-12-26 ENCOUNTER — Ambulatory Visit (INDEPENDENT_AMBULATORY_CARE_PROVIDER_SITE_OTHER): Payer: No Typology Code available for payment source | Admitting: Physician Assistant

## 2019-12-26 ENCOUNTER — Encounter: Payer: Self-pay | Admitting: Physician Assistant

## 2019-12-26 DIAGNOSIS — L578 Other skin changes due to chronic exposure to nonionizing radiation: Secondary | ICD-10-CM | POA: Diagnosis not present

## 2019-12-26 DIAGNOSIS — Z1283 Encounter for screening for malignant neoplasm of skin: Secondary | ICD-10-CM

## 2019-12-26 DIAGNOSIS — Z85828 Personal history of other malignant neoplasm of skin: Secondary | ICD-10-CM | POA: Diagnosis not present

## 2019-12-26 DIAGNOSIS — C44511 Basal cell carcinoma of skin of breast: Secondary | ICD-10-CM | POA: Diagnosis not present

## 2019-12-26 DIAGNOSIS — D485 Neoplasm of uncertain behavior of skin: Secondary | ICD-10-CM | POA: Diagnosis not present

## 2019-12-26 DIAGNOSIS — C4491 Basal cell carcinoma of skin, unspecified: Secondary | ICD-10-CM

## 2019-12-26 HISTORY — DX: Basal cell carcinoma of skin, unspecified: C44.91

## 2019-12-26 NOTE — Patient Instructions (Signed)

## 2019-12-30 ENCOUNTER — Other Ambulatory Visit: Payer: Self-pay | Admitting: Family Medicine

## 2019-12-30 ENCOUNTER — Encounter: Payer: Self-pay | Admitting: Physician Assistant

## 2019-12-30 MED FILL — ALPRAZolam 1 MG TABS: 1 | 30 days supply | Qty: 90 | Fill #0

## 2019-12-30 MED FILL — RABEPRAZOLE SOD DR 20 MG TA: 20 | 90 days supply | Qty: 90 | Fill #1

## 2019-12-30 MED FILL — LEVOCETIRIZINE 5 MG TABLET: 5 | 90 days supply | Qty: 90 | Fill #3

## 2019-12-30 NOTE — Telephone Encounter (Signed)
90 no refill

## 2019-12-30 NOTE — Progress Notes (Signed)
   New Patient   Subjective  Theresa [name] is a 49 y.o. female who presents for the following: Skin Problem (Check spot on back looks like SK.Patient wants it removed. Check spot on left upper arm had a spot removed about 10ish years ago patient said it was cancer ? but can not remember what kind or who removed. ).   The following portions of the chart were reviewed this encounter and updated as appropriate: Tobacco  Allergies  Meds  Problems  Med Hx  Surg Hx  Fam Hx      Objective  Well appearing patient in no apparent distress; mood and affect are within normal limits.  A full examination was performed including scalp, head, eyes, ears, nose, lips, neck, chest, axillae, abdomen, back, buttocks, bilateral upper extremities, bilateral lower extremities, hands, feet, fingers, toes, fingernails, and toenails. All findings within normal limits unless otherwise noted below.  Objective  Face, chest: Rough scale- diffuse  Objective  Right Breast: Pearly papule with telangectasia.  4.5 to brown spot     Objective  Left Upper Arm - Anterior: Scar clear  Objective  Right Upper Back: Brown crust 4.6 to angioma      Assessment & Plan  Actinic skin damage (2) Face; chest  Basal cell carcinoma (BCC) of skin of right breast Right Breast  Skin / nail biopsy Type of biopsy: tangential   Informed consent: discussed and consent obtained   Timeout: patient name, date of birth, surgical site, and procedure verified   Anesthesia: the lesion was anesthetized in a standard fashion   Anesthetic:  1% lidocaine w/ epinephrine 1-100,000 local infiltration Instrument used: flexible razor blade   Hemostasis achieved with: aluminum chloride and electrodesiccation   Outcome: patient tolerated procedure well   Post-procedure details: wound care instructions given    Specimen 2 - Surgical pathology Differential Diagnosis: pbcc Check Margins: yes  History of basal cell carcinoma  (BCC) Left Upper Arm - Anterior  Neoplasm of uncertain behavior of skin Right Upper Back  Skin / nail biopsy Type of biopsy: tangential   Informed consent: discussed and consent obtained   Timeout: patient name, date of birth, surgical site, and procedure verified   Anesthesia: the lesion was anesthetized in a standard fashion   Anesthetic:  1% lidocaine w/ epinephrine 1-100,000 local infiltration Instrument used: flexible razor blade   Hemostasis achieved with: aluminum chloride and electrodesiccation   Outcome: patient tolerated procedure well   Post-procedure details: wound care instructions given    Specimen 1 - Surgical pathology Differential Diagnosis: ISK Check Margins: No  Other Related Procedures Skin / nail biopsy Type of biopsy: tangential   Informed consent: discussed and consent obtained   Timeout: patient name, date of birth, surgical site, and procedure verified   Anesthesia: the lesion was anesthetized in a standard fashion   Anesthetic:  1% lidocaine w/ epinephrine 1-100,000 local infiltration Instrument used: flexible razor blade   Hemostasis achieved with: aluminum chloride and electrodesiccation   Outcome: patient tolerated procedure well   Post-procedure details: wound care instructions given    Screening exam for skin cancer Head - to toe

## 2019-12-31 ENCOUNTER — Ambulatory Visit (INDEPENDENT_AMBULATORY_CARE_PROVIDER_SITE_OTHER): Payer: No Typology Code available for payment source

## 2019-12-31 ENCOUNTER — Encounter: Payer: Self-pay | Admitting: Physician Assistant

## 2019-12-31 ENCOUNTER — Telehealth: Payer: Self-pay

## 2019-12-31 ENCOUNTER — Other Ambulatory Visit (INDEPENDENT_AMBULATORY_CARE_PROVIDER_SITE_OTHER): Payer: Self-pay | Admitting: *Deleted

## 2019-12-31 DIAGNOSIS — J309 Allergic rhinitis, unspecified: Secondary | ICD-10-CM | POA: Diagnosis not present

## 2019-12-31 DIAGNOSIS — K219 Gastro-esophageal reflux disease without esophagitis: Secondary | ICD-10-CM

## 2019-12-31 DIAGNOSIS — R1084 Generalized abdominal pain: Secondary | ICD-10-CM

## 2019-12-31 DIAGNOSIS — K588 Other irritable bowel syndrome: Secondary | ICD-10-CM

## 2019-12-31 NOTE — Telephone Encounter (Signed)
Phone from patient to schedule her appointment for 30 min surgery with [facility].  Appointment scheduled.

## 2019-12-31 NOTE — Telephone Encounter (Signed)
-----   Message from Warren Danes, [place] sent at 12/30/2019  3:52 PM EDT ----- 30 sx

## 2019-12-31 NOTE — Telephone Encounter (Signed)
Please see patient's message

## 2020-01-27 ENCOUNTER — Telehealth: Payer: Self-pay | Admitting: Family Medicine

## 2020-01-27 MED FILL — IPRATROPIUM 0.06% SPRAY: 0.06 | 14 days supply | Qty: 15 | Fill #2

## 2020-01-27 NOTE — Telephone Encounter (Signed)
Last seen for anxiety on 03/21/19

## 2020-01-28 ENCOUNTER — Encounter: Payer: Self-pay | Admitting: Nurse Practitioner

## 2020-01-28 ENCOUNTER — Other Ambulatory Visit: Payer: Self-pay | Admitting: Nurse Practitioner

## 2020-01-28 MED ORDER — ALPRAZOLAM 1 MG PO TABS: ORAL_TABLET | ORAL | 0 refills | Status: DC

## 2020-01-28 MED FILL — ALPRAZolam 1 MG TABS: 1 | 30 days supply | Qty: 90 | Fill #0

## 2020-01-28 NOTE — Telephone Encounter (Signed)
I think she is talking about Xanax. She should be able to get this filled but please call and make request for her and let her know. Thanks.

## 2020-01-28 NOTE — Telephone Encounter (Signed)
Please schedule

## 2020-01-28 NOTE — Telephone Encounter (Signed)
Scheduled 7/2

## 2020-01-30 ENCOUNTER — Telehealth: Payer: Self-pay | Admitting: Nurse Practitioner

## 2020-01-30 NOTE — Telephone Encounter (Signed)
Pt is needing xanax switched to [place] is leaving to go out of town today. Hoyle Sauer has approved her to get it early but [facility] didn't deliver it to [employer] and she is unable to go to [facility] today. [facility] has canceled the script.   Please transfer to [facility].

## 2020-01-30 NOTE — Telephone Encounter (Signed)
Pt does not want meds going to [facility] she said to leave it at [facility]

## 2020-01-30 NOTE — Telephone Encounter (Signed)
Please advise. Thank you

## 2020-01-31 ENCOUNTER — Encounter: Payer: Self-pay | Admitting: Nurse Practitioner

## 2020-02-09 ENCOUNTER — Other Ambulatory Visit (HOSPITAL_COMMUNITY)
Admission: RE | Admit: 2020-02-09 | Discharge: 2020-02-09 | Disposition: A | Discharge status: Home / Self Care | Payer: No Typology Code available for payment source | Source: Ambulatory Visit | Attending: Internal Medicine | Admitting: Internal Medicine

## 2020-02-09 DIAGNOSIS — K588 Other irritable bowel syndrome: Secondary | ICD-10-CM | POA: Insufficient documentation

## 2020-02-10 LAB — CHROMOGRANIN A: Chromogranin A (ng/mL): 875.8 ng/mL — ABNORMAL HIGH (ref 0.0–101.8)

## 2020-02-11 ENCOUNTER — Encounter (INDEPENDENT_AMBULATORY_CARE_PROVIDER_SITE_OTHER): Payer: Self-pay

## 2020-02-12 ENCOUNTER — Other Ambulatory Visit (INDEPENDENT_AMBULATORY_CARE_PROVIDER_SITE_OTHER): Payer: Self-pay | Admitting: *Deleted

## 2020-02-13 ENCOUNTER — Other Ambulatory Visit: Payer: Self-pay

## 2020-02-13 ENCOUNTER — Telehealth: Payer: Self-pay | Admitting: *Deleted

## 2020-02-13 ENCOUNTER — Telehealth (INDEPENDENT_AMBULATORY_CARE_PROVIDER_SITE_OTHER): Payer: No Typology Code available for payment source | Admitting: Nurse Practitioner

## 2020-02-13 ENCOUNTER — Encounter: Payer: Self-pay | Admitting: Nurse Practitioner

## 2020-02-13 DIAGNOSIS — F411 Generalized anxiety disorder: Secondary | ICD-10-CM

## 2020-02-13 NOTE — Telephone Encounter (Signed)
Ms. Theresa [name], notte are scheduled for a virtual visit with your provider today.    Just as we do with appointments in the office, we must obtain your consent to participate.  Your consent will be active for this visit and any virtual visit you may have with one of our providers in the next 365 days.    If you have a MyChart account, I can also send a copy of this consent to you electronically.  All virtual visits are billed to your insurance company just like a traditional visit in the office.  As this is a virtual visit, video technology does not allow for your provider to perform a traditional examination.  This may limit your provider's ability to fully assess your condition.  If your provider identifies any concerns that need to be evaluated in person or the need to arrange testing such as labs, EKG, etc, we will make arrangements to do so.    Although advances in technology are sophisticated, we cannot ensure that it will always work on either your end or our end.  If the connection with a video visit is poor, we may have to switch to a telephone visit.  With either a video or telephone visit, we are not always able to ensure that we have a secure connection.   I need to obtain your verbal consent now.   Are you willing to proceed with your visit today?   [name] has provided verbal consent on 02/13/2020 for a virtual visit (video or telephone).   [name], RN 02/13/2020  3:23 PM

## 2020-02-13 NOTE — Progress Notes (Signed)
  Video visit Subjective:    Patient ID: Theresa [name], female    DOB: [birth year], 49 y.o.   MRN: [id number]  HPI  Patient calls for a follow up on anxiety.  Virtual Visit via Video Note  I connected with Theresa [name] on 02/13/20 at  3:20 PM EDT by a video enabled telemedicine application and verified that I am speaking with the correct person using two identifiers.  Location: Patient: Work Provider: office   I discussed the limitations of evaluation and management by telemedicine and the availability of in person appointments. The patient expressed understanding and agreed to proceed.  History of Present Illness: Presents for routine follow-up of her anxiety.  Continues to do well on her Celexa.  Has had some increased anxiety and stress lately.  Relates much of this to some family issues but patient has been making some changes which has helped.  Sleep has been doing better, takes 2 apple cider Gummies at night.  Also takes 2 of her Xanax at night with up to a half tab twice a day as needed. continues follow-up with her specialist.  States her chromogranin a level was recently checked and was very elevated, is going to be seeing her GI specialist soon about this.  After the visit also shares that she is being followed for basal cell cancer which is added to her stress. GAD 7 : Generalized Anxiety Score 02/13/2020  Nervous, Anxious, on Edge 1  Control/stop worrying 1  Worry too much - different things 2  Trouble relaxing 0  Restless 0  Easily annoyed or irritable 1  Afraid - awful might happen 0  Total GAD 7 Score 5  Anxiety Difficulty Not difficult at all      Observations/Objective: NAD.  Alert, oriented.  Calm cheerful affect.  Making good eye contact.  Dressed appropriately at work.  Thoughts logical coherent and relevant.  Assessment and Plan: Problem List Items Addressed This Visit      Other   Generalized anxiety disorder - Primary     Meds ordered this encounter    Medications  . LORazepam (ATIVAN) 1 MG tablet    Sig: Take 1/2 tab po BID during the day if needed; take one tab po qhs prn sleep    Dispense:  60 tablet    Refill:  2    Order Specific Question:   Supervising Provider    Answer:   Sallee Lange A [[id number]]   Discussed switching to a longer acting med versus Xanax.  Will switch to lorazepam 1 mg with lower dosing from previous med.  Hopefully patient will need less medication with the change in therapy.  Call back if any problems.  Note she gets regular physicals with her gynecologist.  Follow Up Instructions: Return in about 3 months (around 05/15/2020) for Recheck on anxiety; virtual or phone is fine.    I discussed the assessment and treatment plan with the patient. The patient was provided an opportunity to ask questions and all were answered. The patient agreed with the plan and demonstrated an understanding of the instructions.   The patient was advised to call back or seek an in-person evaluation if the symptoms worsen or if the condition fails to improve as anticipated.  I provided 15 minutes of non-face-to-face time during this encounter.      Review of Systems     Objective:   Physical Exam        Assessment & Plan:

## 2020-02-15 ENCOUNTER — Encounter: Payer: Self-pay | Admitting: Nurse Practitioner

## 2020-02-15 MED ORDER — LORAZEPAM 1 MG PO TABS: ORAL_TABLET | ORAL | 2 refills | Status: DC

## 2020-02-16 MED FILL — LORazepam 1 MG TABS: 1 | 30 days supply | Qty: 60 | Fill #0

## 2020-02-17 ENCOUNTER — Other Ambulatory Visit: Payer: Self-pay | Admitting: Allergy & Immunology

## 2020-02-17 ENCOUNTER — Other Ambulatory Visit (INDEPENDENT_AMBULATORY_CARE_PROVIDER_SITE_OTHER): Payer: Self-pay | Admitting: *Deleted

## 2020-02-17 MED FILL — FLUTICASONE PROP 50 MCG SPR: 50 | 30 days supply | Qty: 16 | Fill #0

## 2020-02-19 ENCOUNTER — Encounter (INDEPENDENT_AMBULATORY_CARE_PROVIDER_SITE_OTHER): Payer: Self-pay

## 2020-02-19 ENCOUNTER — Other Ambulatory Visit (HOSPITAL_COMMUNITY): Payer: Self-pay | Admitting: Internal Medicine

## 2020-02-19 DIAGNOSIS — R7989 Other specified abnormal findings of blood chemistry: Secondary | ICD-10-CM

## 2020-02-24 ENCOUNTER — Telehealth: Payer: Self-pay | Admitting: Allergy & Immunology

## 2020-02-24 ENCOUNTER — Encounter: Payer: Self-pay | Admitting: Nurse Practitioner

## 2020-02-24 ENCOUNTER — Other Ambulatory Visit: Payer: Self-pay | Admitting: Nurse Practitioner

## 2020-02-24 ENCOUNTER — Telehealth: Payer: Self-pay | Admitting: Family Medicine

## 2020-02-24 MED ORDER — CLONAZEPAM 1 MG PO TABS: ORAL_TABLET | ORAL | 2 refills | Status: DC

## 2020-02-24 NOTE — Telephone Encounter (Signed)
Per Dr. Ernst Bowler, called patient to reschedule 7/28 appointment for a COVID component testing in [place]. Left voicemail for patient to call the office back.

## 2020-02-24 NOTE — Telephone Encounter (Signed)
Called pt to discuss meds. Pt states she would like to stay on xanax and citalopram and not the klonopin. States she tried that med in the past and it did not help. Pt aware carol[facility] out of office til Thursday.

## 2020-02-24 NOTE — Telephone Encounter (Signed)
Pharmacy is calling to confirm medication Hoyle Sauer called in. clonazePAM (KLONOPIN) 1 MG tablet   They want to confirm if pt is still be prescribed citalopram and alprazolam.

## 2020-02-26 ENCOUNTER — Other Ambulatory Visit: Payer: Self-pay | Admitting: Nurse Practitioner

## 2020-02-26 MED ORDER — ALPRAZOLAM 1 MG PO TABS: 1.0000 mg | ORAL_TABLET | Freq: Two times a day (BID) | ORAL | 0 refills | Status: DC | PRN

## 2020-02-26 MED FILL — ALPRAZolam 1 MG TABS: 1 | 30 days supply | Qty: 60 | Fill #0

## 2020-02-26 NOTE — Telephone Encounter (Signed)
Please see mychart message.

## 2020-02-27 ENCOUNTER — Ambulatory Visit (HOSPITAL_COMMUNITY)
Admission: RE | Admit: 2020-02-27 | Discharge: 2020-02-27 | Disposition: A | Discharge status: Home / Self Care | Payer: No Typology Code available for payment source | Source: Ambulatory Visit | Attending: Internal Medicine | Admitting: Internal Medicine

## 2020-02-27 ENCOUNTER — Other Ambulatory Visit: Payer: Self-pay

## 2020-02-27 DIAGNOSIS — R7989 Other specified abnormal findings of blood chemistry: Secondary | ICD-10-CM | POA: Insufficient documentation

## 2020-02-27 MED ORDER — FLUDEOXYGLUCOSE F - 18 (FDG) INJECTION
5.3800 | Freq: Once | INTRAVENOUS | Status: AC
  Administered 2020-02-27: 5.38 via INTRAVENOUS

## 2020-03-02 ENCOUNTER — Other Ambulatory Visit (INDEPENDENT_AMBULATORY_CARE_PROVIDER_SITE_OTHER): Payer: Self-pay

## 2020-03-02 ENCOUNTER — Other Ambulatory Visit (INDEPENDENT_AMBULATORY_CARE_PROVIDER_SITE_OTHER): Payer: Self-pay | Admitting: *Deleted

## 2020-03-02 DIAGNOSIS — K588 Other irritable bowel syndrome: Secondary | ICD-10-CM

## 2020-03-10 ENCOUNTER — Ambulatory Visit: Payer: No Typology Code available for payment source | Admitting: Allergy & Immunology

## 2020-03-18 ENCOUNTER — Telehealth: Payer: Self-pay | Admitting: Nurse Practitioner

## 2020-03-18 ENCOUNTER — Encounter: Payer: No Typology Code available for payment source | Admitting: Physician Assistant

## 2020-03-18 NOTE — Telephone Encounter (Signed)
[facility] pharmacy requesting refill on Citalopram 40 mg tablet. Take one tablet po each day. Please advise. Thank you

## 2020-03-19 ENCOUNTER — Other Ambulatory Visit: Payer: Self-pay | Admitting: Nurse Practitioner

## 2020-03-19 MED ORDER — CITALOPRAM HYDROBROMIDE 40 MG PO TABS: 40.0000 mg | ORAL_TABLET | Freq: Every day | ORAL | 1 refills | Status: DC

## 2020-03-19 MED FILL — CITALOPRAM HBR 40 MG TABLET: 40 | 90 days supply | Qty: 90 | Fill #0

## 2020-03-19 NOTE — Telephone Encounter (Signed)
Done

## 2020-03-23 ENCOUNTER — Encounter: Payer: Self-pay | Admitting: Emergency Medicine

## 2020-03-23 ENCOUNTER — Ambulatory Visit
Admission: EM | Admit: 2020-03-23 | Discharge: 2020-03-23 | Disposition: A | Discharge status: Home / Self Care | Payer: No Typology Code available for payment source | Attending: Emergency Medicine | Admitting: Emergency Medicine

## 2020-03-23 ENCOUNTER — Other Ambulatory Visit: Payer: Self-pay

## 2020-03-23 DIAGNOSIS — Z1152 Encounter for screening for COVID-19: Secondary | ICD-10-CM

## 2020-03-23 DIAGNOSIS — J069 Acute upper respiratory infection, unspecified: Secondary | ICD-10-CM

## 2020-03-23 MED ORDER — PREDNISONE 10 MG (21) PO TBPK: ORAL_TABLET | ORAL | 0 refills | Status: DC

## 2020-03-23 NOTE — Discharge Instructions (Signed)
COVID testing ordered.  It will take between 2-7 days for test results.  Someone will contact you regarding abnormal results.    In the meantime: You should remain isolated in your home for 10 days from symptom onset AND greater than 24 hours after symptoms resolution (absence of fever without the use of fever-reducing medication and improvement in respiratory symptoms), whichever is longer Get plenty of rest and push fluids Prednisone taper was prescribed/take as directed Continue zyrtec for nasal congestion, runny nose, and/or sore throat Continue flonase for nasal congestion and runny nose Use medications daily for symptom relief Use OTC medications like ibuprofen or tylenol as needed fever or pain Call or go to the ED if you have any new or worsening symptoms such as fever, worsening cough, shortness of breath, chest tightness, chest pain, turning blue, changes in mental status, etc..Marland Kitchen

## 2020-03-23 NOTE — ED Triage Notes (Signed)
Sinus congestion symptoms that started x 2 days ago.  Needs covid test done per health at work.

## 2020-03-23 NOTE — ED Provider Notes (Signed)
[place]   [id number] 03/23/20 Arrival Time: 0801   LN:[id number] symptom   SUBJECTIVE: History from: patient.  Theresa [name] is a 49 y.o. female who presents to the urgent care with a complaint of nasal congestion, postnasal drip for the past 2 days.  Reports she was advised to have Covid test done per health at work.  Denies sick exposure to COVID, flu or strep.  Report recent travel to [facility].  Has tried Zyrtec, Flonase without relief.  Denies alleviating or aggravating factors.  Denies previous symptoms in the past.   Denies fever, chills, fatigue, sinus pain, rhinorrhea, sore throat, SOB, wheezing, chest pain, nausea, changes in bowel or bladder habits.     ROS: As per HPI.  All other pertinent ROS negative.      Past Medical History:  Diagnosis Date   Allergy    Allergy to alpha-gal    Anxiety    Colon adenoma DEC 2014   ONE(7 MM) SIMPLE([place])   Depression    Edema    GERD (gastroesophageal reflux disease)    History of abnormal cervical Pap smear 12/02/2013   History of nephrolithiasis    HPV (human papilloma virus) infection    Hyperlipidemia    IBS (irritable bowel syndrome)    constipation   Insomnia    Obesity 01/14/2014   Pre-diabetes 09/2014   Yeast infection 09/04/2013   Past Surgical History:  Procedure Laterality Date   BIOPSY N/A 08/01/2013   Procedure: BIOPSY;  Surgeon: Danie Binder, MD;  [facility];  Service: Endoscopy;  Laterality: N/A;  FOR MICROSCOPIC COLITIS   BIOPSY  08/09/2018   Procedure: BIOPSY;  Surgeon: Rogene Houston, MD;  [facility];  Service: Endoscopy;;  gastric   BREAST REDUCTION SURGERY  2009   BREAST SURGERY  2007   breast reduction   CHOLECYSTECTOMY N/A 11/04/2014   Procedure: LAPAROSCOPIC CHOLECYSTECTOMY;  Surgeon: Aviva Signs Md, MD;  Location: [facility];  Service: General;  Laterality: N/A;   COLONOSCOPY  Oct 2009   Dr. Carlean Purl: int/ext hemorrhoids, ?mild  proctitis but path benign, likely r/t irritation from bowel prep   COLONOSCOPY N/A 08/01/2013   Procedure: COLONOSCOPY;  Surgeon: Danie Binder, MD;  [facility];  Service: Endoscopy;  Laterality: N/A;  12:00   COLONOSCOPY WITH PROPOFOL N/A 08/09/2018   Procedure: COLONOSCOPY WITH PROPOFOL;  Surgeon: Rogene Houston, MD;  [facility];  Service: Endoscopy;  Laterality: N/A;  730   ESOPHAGOGASTRODUODENOSCOPY  Dec 2009   Dr. Carlean Purl: reflux esophagitis, proximal gastric polyps, benign   ESOPHAGOGASTRODUODENOSCOPY N/A 12/18/2014   Procedure: ESOPHAGOGASTRODUODENOSCOPY (EGD);  Surgeon: Rogene Houston, MD;  [facility];  Service: Endoscopy;  Laterality: N/A;  230   ESOPHAGOGASTRODUODENOSCOPY (EGD) WITH PROPOFOL N/A 08/09/2018   Procedure: ESOPHAGOGASTRODUODENOSCOPY (EGD) WITH PROPOFOL;  Surgeon: Rogene Houston, MD;  [facility];  Service: Endoscopy;  Laterality: N/A;   HEMORRHOID BANDING N/A 08/01/2013   Procedure: HEMORRHOID BANDING;  Surgeon: Danie Binder, MD;  [facility];  Service: Endoscopy;  Laterality: N/A;  internal hemorrhoid banding   LIVER BIOPSY N/A 11/04/2014   Procedure: LIVER BIOPSY;  Surgeon: Aviva Signs Md, MD;  Location: [facility];  Service: General;  Laterality: N/A;   POLYPECTOMY  2015   Dr.Fields   POLYPECTOMY  08/09/2018   Procedure: POLYPECTOMY;  Surgeon: Rogene Houston, MD;  [facility];  Service: Endoscopy;;  colon  REDUCTION MAMMAPLASTY Bilateral 2006   UPPER GASTROINTESTINAL ENDOSCOPY  2006   Dr. Nita Sickle in [facility]   Allergies  Allergen Reactions   Heparin    Ativan [Lorazepam] Other (See Comments)    Nightmares and feelings of "withdrawal"    Phentermine Palpitations   No current facility-administered medications on file prior to encounter.   Current Outpatient Medications on File Prior to Encounter  Medication Sig Dispense Refill   ALPRAZolam (XANAX) 1 MG tablet  Take 1 tablet (1 mg total) by mouth 2 (two) times daily as needed for anxiety. 60 tablet 0   citalopram (CELEXA) 40 MG tablet Take 1 tablet (40 mg total) by mouth daily. 90 tablet 1   cycloSPORINE (RESTASIS) 0.05 % ophthalmic emulsion Place 1 drop into both eyes 2 (two) times daily.     dicyclomine (BENTYL) 20 MG tablet Take 20 mg by mouth daily as needed for spasms.     EPINEPHrine (EPIPEN 2-PAK) 0.3 mg/0.3 mL IJ SOAJ injection Inject 0.3 mLs (0.3 mg total) into the muscle as needed for anaphylaxis. 1 each 0   fluticasone (FLONASE) 50 MCG/ACT nasal spray INSTILL 1 SPRAY INTO BOTH NOSTRILS TWICE DAILY AS NEEDED FOR ALLERGIES OR RHINITIS 16 g 5   ipratropium (ATROVENT) 0.06 % nasal spray Place 2 sprays into both nostrils 3 (three) times daily. 15 mL 5   levocetirizine (XYZAL ALLERGY 24HR) 5 MG tablet Take 1 tablet (5 mg total) by mouth every evening. 30 tablet 5   levonorgestrel (MIRENA) 20 MCG/24HR IUD 1 each by Intrauterine route once.     prednisoLONE acetate (PREDNISOLONE ACETATE P-F) 1 % ophthalmic suspension Place 1 drop into both eyes 2 (two) times daily as needed.     PRESCRIPTION MEDICATION Patient is taking allergy shots weeklly.     RABEprazole (ACIPHEX) 20 MG tablet Take 1 tablet (20 mg total) by mouth daily before breakfast. 90 tablet 1   valACYclovir (VALTREX) 1000 MG tablet Take 1,000 mg by mouth daily.     Social History   Socioeconomic History   Marital status: Married    Spouse name: Not on file   Number of children: 1   Years of education: Not on file   Highest education level: Not on file  Occupational History   Occupation: Therapist, sports    Employer: [employer]  Tobacco Use   Smoking status: Never Smoker   Smokeless tobacco: Never Used   Tobacco comment: Never smoker  Vaping Use   Vaping Use: Never used  Substance and Sexual Activity   Alcohol use: Yes    Alcohol/week: 0.0 standard drinks    Comment: socially   Drug use: No   Sexual activity: Yes      Birth control/protection: I.U.D.  Other Topics Concern   Not on file  Social History Narrative   Not on file   Social Determinants of Health   Financial Resource Strain:    Difficulty of Paying Living Expenses:   Food Insecurity:    Worried About Charity fundraiser in the Last Year:    Arboriculturist in the Last Year:   Transportation Needs:    Film/video editor (Medical):    Lack of Transportation (Non-Medical):   Physical Activity:    Days of Exercise per Week:    Minutes of Exercise per Session:   Stress:    Feeling of Stress :   Social Connections:    Frequency of Communication with Friends and Family:    Frequency of Social  Gatherings with Friends and Family:    Attends Religious Services:    Active Member of Clubs or Organizations:    Attends Music therapist:    Marital Status:   Intimate Partner Violence:    Fear of Current or Ex-Partner:    Emotionally Abused:    Physically Abused:    Sexually Abused:    Family History  Problem Relation Age of Onset   Cancer Mother        head and neck   Hypertension Father    Hyperlipidemia Father    Pancreatic cancer Maternal Grandfather    Colon cancer Neg Hx     OBJECTIVE:  Vitals:   03/23/20 0814 03/23/20 0829  BP: 129/81   Pulse: 80   Resp: 16   Temp: 98.4 F (36.9 C)   TempSrc: Oral   SpO2: 98%   Weight:  205 lb 0.4 oz (93 kg)  Height:  5\' 6"  (1.676 m)     General appearance: alert; appears fatigued, but nontoxic; speaking in full sentences and tolerating own secretions HEENT: NCAT; Ears: EACs clear, TMs middle ear effusion present bilaterally; Eyes: PERRL.  EOM grossly intact. Sinuses: nontender; Nose: nares patent without rhinorrhea, Throat: oropharynx clear, tonsils non erythematous or enlarged, uvula midline  Neck: supple without LAD Lungs: unlabored respirations, symmetrical air entry; cough: absent; no respiratory distress; CTAB Heart: regular rate  and rhythm.  Radial pulses 2+ symmetrical bilaterally Skin: warm and dry Psychological: alert and cooperative; normal mood and affect  LABS:  No results found for this or any previous visit (from the past 24 hour(s)).   ASSESSMENT & PLAN:  1. Acute URI   2. Encounter for screening for COVID-19     Meds ordered this encounter  Medications   predniSONE (STERAPRED UNI-PAK 21 TAB) 10 MG (21) TBPK tablet    Sig: Take 6 tabs by mouth daily  for 1 days, then 5 tabs for 1 days, then 4 tabs for 1 days, then 3 tabs for 1 days, 2 tabs for 1 days, then 1 tab by mouth daily for 1 days    Dispense:  21 tablet    Refill:  0   Discharge Instructions    COVID testing ordered.  It will take between 2-7 days for test results.  Someone will contact you regarding abnormal results.    In the meantime: You should remain isolated in your home for 10 days from symptom onset AND greater than 24 hours after symptoms resolution (absence of fever without the use of fever-reducing medication and improvement in respiratory symptoms), whichever is longer Get plenty of rest and push fluids Prednisone taper was prescribed/take as directed Continue zyrtec for nasal congestion, runny nose, and/or sore throat Continue flonase for nasal congestion and runny nose Use medications daily for symptom relief Use OTC medications like ibuprofen or tylenol as needed fever or pain Call or go to the ED if you have any new or worsening symptoms such as fever, worsening cough, shortness of breath, chest tightness, chest pain, turning blue, changes in mental status, etc...   Reviewed expectations re: course of current medical issues. Questions answered. Outlined signs and symptoms indicating need for more acute intervention. Patient verbalized understanding. After Visit Summary given.      Note: This document was prepared using Dragon voice recognition software and may include unintentional dictation errors.    Emerson Monte, [place] 03/23/20 [phone]

## 2020-03-24 ENCOUNTER — Ambulatory Visit: Payer: No Typology Code available for payment source | Admitting: Family

## 2020-03-25 LAB — SARS-COV-2, NAA 2 DAY TAT

## 2020-03-25 LAB — NOVEL CORONAVIRUS, NAA: SARS-CoV-2, NAA: NOT DETECTED

## 2020-03-29 ENCOUNTER — Other Ambulatory Visit (INDEPENDENT_AMBULATORY_CARE_PROVIDER_SITE_OTHER): Payer: Self-pay | Admitting: Gastroenterology

## 2020-03-29 ENCOUNTER — Other Ambulatory Visit (INDEPENDENT_AMBULATORY_CARE_PROVIDER_SITE_OTHER): Payer: Self-pay | Admitting: Internal Medicine

## 2020-03-29 ENCOUNTER — Other Ambulatory Visit: Payer: Self-pay | Admitting: Allergy & Immunology

## 2020-03-29 DIAGNOSIS — K219 Gastro-esophageal reflux disease without esophagitis: Secondary | ICD-10-CM

## 2020-03-29 MED FILL — RABEPRAZOLE SOD DR 20 MG TA: 20 | 90 days supply | Qty: 90 | Fill #0

## 2020-03-30 MED FILL — LEVOCETIRIZINE 5 MG TABLET: 5 | 90 days supply | Qty: 90 | Fill #0

## 2020-04-02 ENCOUNTER — Other Ambulatory Visit: Payer: Self-pay | Admitting: Nurse Practitioner

## 2020-04-02 ENCOUNTER — Encounter: Payer: Self-pay | Admitting: Nurse Practitioner

## 2020-04-03 ENCOUNTER — Other Ambulatory Visit: Payer: Self-pay | Admitting: Nurse Practitioner

## 2020-04-03 MED ORDER — ALPRAZOLAM 1 MG PO TABS: 1.0000 mg | ORAL_TABLET | Freq: Two times a day (BID) | ORAL | 0 refills | Status: DC | PRN

## 2020-04-05 MED FILL — ALPRAZOLAM 1 MG TABS: 1 | 30 days supply | Qty: 60 | Fill #0

## 2020-04-14 ENCOUNTER — Other Ambulatory Visit: Payer: Self-pay

## 2020-04-14 ENCOUNTER — Encounter: Payer: Self-pay | Admitting: Allergy & Immunology

## 2020-04-14 ENCOUNTER — Ambulatory Visit (INDEPENDENT_AMBULATORY_CARE_PROVIDER_SITE_OTHER): Payer: No Typology Code available for payment source | Admitting: Allergy & Immunology

## 2020-04-14 VITALS — BP 124/80 | HR 81 | Temp 97.9°F | Resp 16 | Wt 204.0 lb

## 2020-04-14 DIAGNOSIS — T50B95D Adverse effect of other viral vaccines, subsequent encounter: Secondary | ICD-10-CM | POA: Diagnosis not present

## 2020-04-14 DIAGNOSIS — T50Z95D Adverse effect of other vaccines and biological substances, subsequent encounter: Secondary | ICD-10-CM

## 2020-04-14 NOTE — Patient Instructions (Addendum)
1. Concern for COVID19 vaccine reaction - Testing today was negative, including the challenge to the Miralax (PEG). - I think that you will tolerate the COVID vaccine well. - There is currently no definite skin testing available for the Pfizer and Moderna COVID vaccines and data is limited however, there has been a concern regarding sensitivity to PEG in patients who had anaphylactic reactions to the COVID-19 vaccine. - Skin testing was negative to Miralax (source of PEG 3350), methylprednisolone acetate (source of PEG 3350), and triamcinolone acetonide (source of polysorbate-80). - We are going to call you at 2:30pm to see how the vaccine goes.  2. Return in about 3 months (around 07/14/2020). This can be an in-person, a virtual Webex or a telephone follow up visit.   Please inform us of any Emergency Department visits, hospitalizations, or changes in symptoms. Call us before going to the ED for breathing or allergy symptoms since we might be able to fit you in for a sick visit. Feel free to contact us anytime with any questions, problems, or concerns.  It was a pleasure to see you again today!  Websites that have reliable patient information: 1. American Academy of Asthma, Allergy, and Immunology: www.aaaai.org 2. Food Allergy Research and Education (FARE): foodallergy.org 3. Mothers of Asthmatics: http://www.asthmacommunitynetwork.org 4. American College of Allergy, Asthma, and Immunology: www.acaai.org   COVID-19 Vaccine Information can be found at: ShippingScam.co.uk For questions related to vaccine distribution or appointments, please email vaccine@[email or web address] [email or web address] or call [phone].     "Like" [place] on Facebook and Instagram for our latest updates!        Make sure you are registered to vote! If you have moved or changed any of your contact information, you will need to get this updated before voting!  In some cases, you  MAY be able to register to vote online: [email or web address]

## 2020-04-14 NOTE — Progress Notes (Signed)
FOLLOW UP  Date of Service/Encounter:  04/14/20   Assessment:   Vaccines and biological substances causing adverse effect - with negative testing to the COVID vaccine components  Plan/Recommendations:   1. Concern for COVID19 vaccine reaction - Testing today was negative, including the challenge to the Miralax (PEG). - I think that you will tolerate the COVID vaccine well. - There is currently no definite skin testing available for the Pfizer and Moderna COVID vaccines and data is limited however, there has been a concern regarding sensitivity to PEG in patients who had anaphylactic reactions to the COVID-19 vaccine. - Skin testing was negative to Miralax (source of PEG 3350), methylprednisolone acetate (source of PEG 3350), and triamcinolone acetonide (source of polysorbate-80). - We are going to call you at 2:30pm to see how the vaccine goes.  2. Return in about 3 months (around 07/14/2020). This can be an in-person, a virtual Webex or a telephone follow up visit.   Subjective:   Theresa [name] is a 49 y.o. female presenting today for follow up of  Chief Complaint  Patient presents with  . Food/Drug Challenge    Covid Component    Theresa [name] has a history of the following: Patient Active Problem List   Diagnosis Date Noted  . Separation of right acromioclavicular joint 02/17/2019  . Concussion with loss of consciousness 02/17/2019  . Idiopathic anaphylaxis 02/12/2019  . Chronic rhinitis 02/12/2019  . Chronic idiopathic urticaria 02/12/2019  . Hx of colonic polyps 07/17/2018  . Gastroesophageal reflux disease 07/17/2018  . Positive test for Epstein-Barr virus (EBV) 08/21/2017  . Anxiety and depression 07/28/2017  . Fatigue 07/12/2015  . Arthralgia 07/12/2015  . AP (abdominal pain) 07/06/2015  . Obesity 01/14/2014  . History of abnormal cervical Pap smear 12/02/2013  . Internal hemorrhoids with other complication 81/19/1478  . Generalized anxiety disorder 12/25/2012   . GERD (gastroesophageal reflux disease) 10/31/2011  . GASTROESOPHAGEAL REFLUX DISEASE 07/13/2008  . HEARTBURN 07/13/2008  . Constipation 05/13/2008  . BLOOD IN STOOL 05/13/2008  . CHANGE IN BOWELS 05/13/2008    History obtained from: chart review and patient.  Theresa [name] is a 49 y.o. female presenting for a follow up visit. She was last seen in January 2021. At that time, we recommended continued avoidance of red meat due to alpha gal syndrome. For her allergic rhinitis, she made the decision to start allergen immunotherapy. We also gave her prednisone since her symptoms were out of control at that point in time.   In the interim, she did start her allergy shots. But she experienced anaphylaxis to the first dose of her allergy shots from her [facility]. Therefore, we diluted down to the [employer]. She seemed to tolerate subsequent doses, although she would occasionally need a few longer visits after her shots for evaluation due to SOB, which was felt to be more related to anxiety.   Theresa [name] recently mandated vaccinations and she is very nervous about getting it, so she came for preemptive COVID-19 vaccine component testing. She has been off her antihistamines for a few days now with worsening of her allergic rhinitis symptoms. She is planning to get the vaccine after her testing today. In fact, she made an appointment for 2:15 PM.  Otherwise, there have been no changes to her past medical history, surgical history, family history, or social history.    Review of Systems  Constitutional: Negative.  Negative for fever, malaise/fatigue and weight loss.  HENT: Negative.  Negative for  congestion, ear discharge and ear pain.   Eyes: Negative for pain, discharge and redness.  Respiratory: Negative for cough, sputum production, shortness of breath and wheezing.   Cardiovascular: Negative.  Negative for chest pain and palpitations.  Gastrointestinal: Negative for abdominal pain and heartburn.    Skin: Negative.  Negative for itching and rash.  Neurological: Negative for dizziness and headaches.  Endo/Heme/Allergies: Negative for environmental allergies. Does not bruise/bleed easily.       Objective:   Blood pressure 124/80, pulse 81, temperature 97.9 F (36.6 C), temperature source Temporal, resp. rate 16, weight 204 lb (92.5 kg), SpO2 97 %. Body mass index is 32.93 kg/m.   Physical Exam: deferred since this was a skin testing appointment only  SKIN TESTING (CPT 95018)  SKIN PRICK TESTING ARM #1 SIZE TIME  1. Histamine (1.8mg /mL) 2+ See scanned flowsheet  2. Control (negative - HSA) Negative See scanned flowsheet  3. Triamcinolone (40mg /mL) Negative See scanned flowsheet  4. Methylprednisolone (40mg /mL) Negative See scanned flowsheet  WAIT 15 MINUTES  5. Miralax (1:100 or 1.7mg /mL) Negative See scanned flowsheet  WAIT 15 MINUTES  6. Miralax (1:10 or 17mg /mL) Negative See scanned flowsheet  WAIT 15 MINUTES  7. Miralax (1:1 or 170mg /mL) Negative See scanned flowsheet  WAIT 15 MINUTES   PUNCTURE TEST TOTAL 7    INTRADERMAL TESTING ARM #2 SIZE TIME  1. Control (negative - HSA) Negative See scanned flowsheet  2. Triamcinolone (1:100) Negative See scanned flowsheet  3. Methylprednisolone (1:100) Negative See scanned flowsheet  WAIT 15 MINUTES  4. Triamcinolone (1:10) Negative See scanned flowsheet  5. Methylprednisolone (1:10) Negative See scanned flowsheet  WAIT 15 MINUTES  6. Triamcinolone (1:1) Negative See scanned flowsheet  WAIT 15 MINUTES   INTRADERMAL TEST TOTAL 6    ORAL CHALLENGE (CPT 95076)  DOSE Pre-Vitals (BP/HR/Resp) TIME GIVEN  1. Miralax 170mg /mL suspension 0.90mL  Within normal limits See scanned flow sheet   See scanned flow sheet   WAIT 20 MINUTES  2. Miralax 170mg /mL suspension 78mL  Within normal limits See scanned flow sheet   See scanned flow sheet   WAIT 20 MINUTES  3. Miralax 170mg /mL suspension 91mL  Within normal  limits See scanned flow sheet   See scanned flow sheet   WAIT 30-60 MINUTES (To be determined by the provider)   Post-Vitals (BP/HR/Resp)  Within normal limits See scanned flow sheet END TIME  See scanned flow sheet     Total Time      Allergy testing results were read and interpreted by myself, documented by clinical staff.      Salvatore Marvel, MD  [facility]

## 2020-04-15 MED FILL — IPRATROPIUM 0.06% SPRAY: 0.06 | 14 days supply | Qty: 15 | Fill #3

## 2020-04-15 MED FILL — FLUTICASONE PROP 50 MCG SPR: 50 | 30 days supply | Qty: 16 | Fill #1

## 2020-04-23 ENCOUNTER — Other Ambulatory Visit (INDEPENDENT_AMBULATORY_CARE_PROVIDER_SITE_OTHER): Payer: Self-pay | Admitting: *Deleted

## 2020-04-23 DIAGNOSIS — K588 Other irritable bowel syndrome: Secondary | ICD-10-CM

## 2020-04-28 ENCOUNTER — Other Ambulatory Visit: Payer: Self-pay

## 2020-04-28 ENCOUNTER — Ambulatory Visit (INDEPENDENT_AMBULATORY_CARE_PROVIDER_SITE_OTHER): Payer: No Typology Code available for payment source

## 2020-04-28 DIAGNOSIS — J309 Allergic rhinitis, unspecified: Secondary | ICD-10-CM

## 2020-04-28 NOTE — Progress Notes (Signed)
Immunotherapy   Patient Details  Name: Theresa [name] MRN: [id number] Date of Birth: 1970/11/19  04/28/2020  Holley Dexter here to restart allergy injections. Patient received 0.05 of both her blue vials with an expiration of 09/10/2020. One with G-W-T-C-D and the other with RW-Molds. Patient waited 30 minutes with no problems. Following schedule: A   Frequency: Weekly Epi-Pen: Yes Consent signed and patient instructions given.   Herbie Drape 04/28/2020, 3:35 PM

## 2020-05-03 ENCOUNTER — Encounter: Payer: Self-pay | Admitting: Allergy & Immunology

## 2020-05-04 ENCOUNTER — Encounter: Payer: Self-pay | Admitting: Nurse Practitioner

## 2020-05-04 ENCOUNTER — Other Ambulatory Visit: Payer: Self-pay | Admitting: Nurse Practitioner

## 2020-05-04 MED ORDER — ALPRAZOLAM 1 MG PO TABS: 1.0000 mg | ORAL_TABLET | Freq: Two times a day (BID) | ORAL | 0 refills | Status: DC | PRN

## 2020-05-05 ENCOUNTER — Ambulatory Visit (INDEPENDENT_AMBULATORY_CARE_PROVIDER_SITE_OTHER): Payer: No Typology Code available for payment source

## 2020-05-05 DIAGNOSIS — J309 Allergic rhinitis, unspecified: Secondary | ICD-10-CM

## 2020-05-05 MED FILL — ALPRAZolam 1 MG TABS: 1 | 30 days supply | Qty: 60 | Fill #0

## 2020-05-06 ENCOUNTER — Encounter: Payer: Self-pay | Admitting: Nurse Practitioner

## 2020-05-19 ENCOUNTER — Ambulatory Visit (INDEPENDENT_AMBULATORY_CARE_PROVIDER_SITE_OTHER): Payer: No Typology Code available for payment source

## 2020-05-19 DIAGNOSIS — J309 Allergic rhinitis, unspecified: Secondary | ICD-10-CM

## 2020-05-26 ENCOUNTER — Ambulatory Visit (INDEPENDENT_AMBULATORY_CARE_PROVIDER_SITE_OTHER): Payer: No Typology Code available for payment source

## 2020-05-26 DIAGNOSIS — J309 Allergic rhinitis, unspecified: Secondary | ICD-10-CM | POA: Diagnosis not present

## 2020-06-02 ENCOUNTER — Ambulatory Visit (INDEPENDENT_AMBULATORY_CARE_PROVIDER_SITE_OTHER): Payer: No Typology Code available for payment source

## 2020-06-02 ENCOUNTER — Other Ambulatory Visit (INDEPENDENT_AMBULATORY_CARE_PROVIDER_SITE_OTHER): Payer: Self-pay | Admitting: *Deleted

## 2020-06-02 ENCOUNTER — Other Ambulatory Visit (HOSPITAL_COMMUNITY)
Admission: RE | Admit: 2020-06-02 | Discharge: 2020-06-02 | Disposition: A | Discharge status: Home / Self Care | Payer: No Typology Code available for payment source | Source: Other Acute Inpatient Hospital | Attending: Internal Medicine | Admitting: Internal Medicine

## 2020-06-02 DIAGNOSIS — K588 Other irritable bowel syndrome: Secondary | ICD-10-CM | POA: Diagnosis not present

## 2020-06-02 DIAGNOSIS — J309 Allergic rhinitis, unspecified: Secondary | ICD-10-CM | POA: Diagnosis not present

## 2020-06-02 DIAGNOSIS — Z86010 Personal history of colonic polyps: Secondary | ICD-10-CM | POA: Insufficient documentation

## 2020-06-02 DIAGNOSIS — R1084 Generalized abdominal pain: Secondary | ICD-10-CM

## 2020-06-03 ENCOUNTER — Other Ambulatory Visit: Payer: Self-pay

## 2020-06-03 ENCOUNTER — Other Ambulatory Visit: Payer: Self-pay | Admitting: Dermatology

## 2020-06-03 ENCOUNTER — Ambulatory Visit (INDEPENDENT_AMBULATORY_CARE_PROVIDER_SITE_OTHER): Payer: No Typology Code available for payment source | Admitting: Dermatology

## 2020-06-03 ENCOUNTER — Encounter: Payer: Self-pay | Admitting: Dermatology

## 2020-06-03 DIAGNOSIS — L821 Other seborrheic keratosis: Secondary | ICD-10-CM

## 2020-06-03 DIAGNOSIS — C44511 Basal cell carcinoma of skin of breast: Secondary | ICD-10-CM

## 2020-06-03 DIAGNOSIS — L918 Other hypertrophic disorders of the skin: Secondary | ICD-10-CM

## 2020-06-03 MED ORDER — IMIQUIMOD 5 % EX CREA: TOPICAL_CREAM | CUTANEOUS | 0 refills | Status: DC

## 2020-06-03 MED FILL — IMIQUIMOD 5% CREAM PACKET: 5 | 14 days supply | Qty: 6 | Fill #0

## 2020-06-07 LAB — CHROMOGRANIN A: Chromogranin A (ng/mL): 181.6 ng/mL — ABNORMAL HIGH (ref 0.0–101.8)

## 2020-06-09 ENCOUNTER — Ambulatory Visit (INDEPENDENT_AMBULATORY_CARE_PROVIDER_SITE_OTHER): Payer: No Typology Code available for payment source

## 2020-06-09 DIAGNOSIS — J309 Allergic rhinitis, unspecified: Secondary | ICD-10-CM | POA: Diagnosis not present

## 2020-06-14 ENCOUNTER — Encounter: Payer: Self-pay | Admitting: Nurse Practitioner

## 2020-06-16 ENCOUNTER — Ambulatory Visit (INDEPENDENT_AMBULATORY_CARE_PROVIDER_SITE_OTHER): Payer: No Typology Code available for payment source

## 2020-06-16 DIAGNOSIS — J309 Allergic rhinitis, unspecified: Secondary | ICD-10-CM | POA: Diagnosis not present

## 2020-06-23 ENCOUNTER — Ambulatory Visit (INDEPENDENT_AMBULATORY_CARE_PROVIDER_SITE_OTHER): Payer: No Typology Code available for payment source

## 2020-06-23 DIAGNOSIS — J309 Allergic rhinitis, unspecified: Secondary | ICD-10-CM

## 2020-06-24 ENCOUNTER — Other Ambulatory Visit: Payer: Self-pay | Admitting: Allergy & Immunology

## 2020-06-24 MED FILL — CITALOPRAM HBR 40 MG TABLET: 40 | 90 days supply | Qty: 90 | Fill #1

## 2020-06-24 MED FILL — FLUTICASONE PROP 50 MCG SPR: 50 | 30 days supply | Qty: 16 | Fill #2

## 2020-06-24 MED FILL — LEVOCETIRIZINE 5 MG TABLET: 5 | 90 days supply | Qty: 90 | Fill #0

## 2020-06-24 MED FILL — RABEPRAZOLE SOD DR 20 MG TA: 20 | 90 days supply | Qty: 90 | Fill #1

## 2020-06-24 MED FILL — IPRATROPIUM 0.06% SPRAY: 0.06 | 14 days supply | Qty: 15 | Fill #4

## 2020-06-30 ENCOUNTER — Ambulatory Visit (INDEPENDENT_AMBULATORY_CARE_PROVIDER_SITE_OTHER): Payer: No Typology Code available for payment source

## 2020-06-30 ENCOUNTER — Telehealth: Payer: Self-pay | Admitting: Nurse Practitioner

## 2020-06-30 DIAGNOSIS — J309 Allergic rhinitis, unspecified: Secondary | ICD-10-CM | POA: Diagnosis not present

## 2020-06-30 NOTE — Telephone Encounter (Signed)
Pt calling in to see if she can have 0.25 Alprazolam called into [facility]. Pt states she is weaning herself off but is needing 0.25 mg. Please advise. Thank you  (Pt also has sent my chart messages)

## 2020-07-02 ENCOUNTER — Other Ambulatory Visit: Payer: Self-pay | Admitting: Nurse Practitioner

## 2020-07-02 MED ORDER — ALPRAZOLAM 0.25 MG PO TABS: 0.2500 mg | ORAL_TABLET | Freq: Every evening | ORAL | 2 refills | Status: DC | PRN

## 2020-07-02 MED FILL — ALPRAZolam 0.25 MG TABS: 0.25 | 30 days supply | Qty: 30 | Fill #0

## 2020-07-02 NOTE — Telephone Encounter (Signed)
Working on this through my chart

## 2020-07-07 ENCOUNTER — Ambulatory Visit (INDEPENDENT_AMBULATORY_CARE_PROVIDER_SITE_OTHER): Payer: No Typology Code available for payment source

## 2020-07-07 DIAGNOSIS — J309 Allergic rhinitis, unspecified: Secondary | ICD-10-CM

## 2020-07-11 ENCOUNTER — Encounter: Payer: Self-pay | Admitting: Allergy & Immunology

## 2020-07-12 ENCOUNTER — Telehealth (INDEPENDENT_AMBULATORY_CARE_PROVIDER_SITE_OTHER): Payer: No Typology Code available for payment source | Admitting: Family Medicine

## 2020-07-12 ENCOUNTER — Encounter: Payer: Self-pay | Admitting: Family Medicine

## 2020-07-12 ENCOUNTER — Other Ambulatory Visit: Payer: Self-pay

## 2020-07-12 DIAGNOSIS — R5383 Other fatigue: Secondary | ICD-10-CM

## 2020-07-12 DIAGNOSIS — R591 Generalized enlarged lymph nodes: Secondary | ICD-10-CM

## 2020-07-12 DIAGNOSIS — M791 Myalgia, unspecified site: Secondary | ICD-10-CM

## 2020-07-12 HISTORY — DX: Generalized enlarged lymph nodes: R59.1

## 2020-07-12 MED FILL — valACYclovir HCL 1 GM TABS: 1 | 30 days supply | Qty: 30 | Fill #1

## 2020-07-12 NOTE — Progress Notes (Signed)
Patient ID: Theresa [name], female    DOB: Jan 14, 1971, 49 y.o.   MRN: [id number]   Chief Complaint  Patient presents with  . Fatigue   Subjective:  CC: fatigue and feeling really bad  Presents today via video visit initially, inability to hear me, changed to telephone visit.  This is not a new problem, presents with complaint of fatigue that has gotten so bad it is debilitating.  Has been present for 7 years and has gotten really really bad in the past 3 months.  She is experiencing a lot of brain fog lately and just keeps repeating that she feels really really bad.  Denies depression anxiety, reports that she is under no more stress and anxiety than any  person. PHQ-9: 11 GAD 7: 5 Specialist include infectious disease, rheumatology, allergy and asthma specialist, GI.  Infectious disease and rheumatology have diagnosed with chronic fatigue syndrome.  Positive alpha gal, positive IgG P41 Ab lyme in Oct 2020 (she was unaware of this)- took a few doses of Doxy that she had when the tick was pulled off- did not take a full course at that time-- complains of joint pain/muscle pain and achiness in her ankles, has axillary node tenderness on the left.  Denies fever, chills, chest pain, shortness of breath, occasionally has a headache which is infrequent. fatigue for 7 years. States it is getting worse. phq9 and gad 7 done.   Virtual Visit via Video Note  I connected with Theresa [name] on 07/12/20 at 11:20 AM EST by a video enabled telemedicine application and verified that I am speaking with the correct person using two identifiers.Connected via video- she was unable to hear me and the visit was changed to phone only.  Location: Patient: home Provider: office   I discussed the limitations of evaluation and management by telemedicine and the availability of in person appointments. The patient expressed understanding and agreed to proceed.  History of Present Illness:     Observations/Objective:   Assessment and Plan:   Follow Up Instructions:    I discussed the assessment and treatment plan with the patient. The patient was provided an opportunity to ask questions and all were answered. The patient agreed with the plan and demonstrated an understanding of the instructions.   The patient was advised to call back or seek an in-person evaluation if the symptoms worsen or if the condition fails to improve as anticipated.  I provided 22 minutes of non-face-to-face time during this encounter.       Medical History Theresa [name] has a past medical history of Allergy, Allergy to alpha-gal, Anxiety, Colon adenoma (DEC 2014), Depression, Edema, GERD (gastroesophageal reflux disease), History of abnormal cervical Pap smear (12/02/2013), History of nephrolithiasis, HPV (human papilloma virus) infection, Hyperlipidemia, IBS (irritable bowel syndrome), Insomnia, Obesity (01/14/2014), Pre-diabetes (09/2014), Superficial basal cell carcinoma (BCC) (12/26/2019), and Yeast infection (09/04/2013).   Outpatient Encounter Medications as of 07/12/2020  Medication Sig  . ALPRAZolam (XANAX) 0.25 MG tablet Take 1 tablet (0.25 mg total) by mouth at bedtime as needed for anxiety.  . citalopram (CELEXA) 40 MG tablet Take 1 tablet (40 mg total) by mouth daily.  . cycloSPORINE (RESTASIS) 0.05 % ophthalmic emulsion Place 1 drop into both eyes 2 (two) times daily.  Marland Kitchen dicyclomine (BENTYL) 20 MG tablet Take 20 mg by mouth daily as needed for spasms.  Marland Kitchen EPINEPHrine (EPIPEN 2-PAK) 0.3 mg/0.3 mL IJ SOAJ injection Inject 0.3 mLs (0.3 mg total) into the muscle as needed for  anaphylaxis.  . fluticasone (FLONASE) 50 MCG/ACT nasal spray INSTILL 1 SPRAY INTO BOTH NOSTRILS TWICE DAILY AS NEEDED FOR ALLERGIES OR RHINITIS  . imiquimod (ALDARA) 5 % cream Apply to affected area nightly M, W, F x 8 weeks  . ipratropium (ATROVENT) 0.06 % nasal spray Place 2 sprays into both nostrils 3 (three) times daily.  Marland Kitchen  levocetirizine (XYZAL) 5 MG tablet TAKE 1 TABLET (5 MG TOTAL) BY MOUTH EVERY EVENING.  Marland Kitchen levonorgestrel (MIRENA) 20 MCG/24HR IUD 1 each by Intrauterine route once.  . prednisoLONE acetate (PREDNISOLONE ACETATE P-F) 1 % ophthalmic suspension Place 1 drop into both eyes 2 (two) times daily as needed.  . RABEprazole (ACIPHEX) 20 MG tablet TAKE 1 TABLET (20 MG TOTAL) BY MOUTH DAILY BEFORE BREAKFAST.  . valACYclovir (VALTREX) 1000 MG tablet Take 1,000 mg by mouth daily.   No facility-administered encounter medications on file as of 07/12/2020.     Review of Systems  Constitutional: Positive for fatigue. Negative for chills and fever.  Respiratory: Negative for shortness of breath.   Cardiovascular: Negative for chest pain.  Gastrointestinal: Negative for abdominal pain.  Hematological: Positive for adenopathy.  Psychiatric/Behavioral:       Brain fog     Vitals There were no vitals taken for this visit.  Objective:   Physical Exam  Unable to perform due to phone visit. Assessment and Plan   1. Fatigue, unspecified type - CBC with Differential - Comprehensive Metabolic Panel (CMET) - TSH - CK (Creatine Kinase) - Hepatitis C Antibody - Lyme Ab/Western Blot Reflex  2. Muscle pain - CK (Creatine Kinase)  3. Lymphadenopathy - CBC with Differential - Lyme Ab/Western Blot Reflex   Fatigue, muscle pain, lymphadenopathy are complex issues.  Will need labs and an in person visit.  Specialists include infectious disease, rheumatology, allergy and asthma specialist, and GI.  She has the diagnosis of chronic fatigue syndrome and has positive ANA with rheumatoid being inconclusive.  With a tick bite in October 2020, and the diagnosis of alpha gal it is important to rule out positive Lyme disease.  She reports that she took a few doxycycline that she had on hand, did not take a full course.   Agrees with plan of care discussed today. Understands warning signs to seek further care:  Chest pain, shortness of breath, any significant change in health. Understands to follow-up in 2 weeks to discuss next steps in person.  Will notify of lab results once they become available.  If Lyme is positive, will treat with Doxy.  Will discuss with Dr. Sallee Lange, once lab results are available.  Pecolia Ades, FNP-C 07/12/2020

## 2020-07-13 LAB — COMPREHENSIVE METABOLIC PANEL
ALT: 21 IU/L (ref 0–32)
AST: 14 IU/L (ref 0–40)
Albumin/Globulin Ratio: 1.6 (ref 1.2–2.2)
Albumin: 4.4 g/dL (ref 3.8–4.8)
Alkaline Phosphatase: 88 IU/L (ref 44–121)
BUN/Creatinine Ratio: 10 (ref 9–23)
BUN: 9 mg/dL (ref 6–24)
Bilirubin Total: 0.7 mg/dL (ref 0.0–1.2)
CO2: 24 mmol/L (ref 20–29)
Calcium: 9.1 mg/dL (ref 8.7–10.2)
Chloride: 99 mmol/L (ref 96–106)
Creatinine, Ser: 0.92 mg/dL (ref 0.57–1.00)
GFR calc Af Amer: 85 mL/min/{1.73_m2} (ref >59)
GFR calc non Af Amer: 74 mL/min/{1.73_m2} (ref >59)
Globulin, Total: 2.7 g/dL (ref 1.5–4.5)
Glucose: 90 mg/dL (ref 65–99)
Potassium: 4.1 mmol/L (ref 3.5–5.2)
Sodium: 138 mmol/L (ref 134–144)
Total Protein: 7.1 g/dL (ref 6.0–8.5)

## 2020-07-13 LAB — CBC WITH DIFFERENTIAL/PLATELET
Basophils Absolute: 0.1 10*3/uL (ref 0.0–0.2)
Basos: 1 %
EOS (ABSOLUTE): 0.2 10*3/uL (ref 0.0–0.4)
Eos: 2 %
Hematocrit: 43.1 % (ref 34.0–46.6)
Hemoglobin: 14.5 g/dL (ref 11.1–15.9)
Immature Grans (Abs): 0 10*3/uL (ref 0.0–0.1)
Immature Granulocytes: 0 %
Lymphocytes Absolute: 1.7 10*3/uL (ref 0.7–3.1)
Lymphs: 17 %
MCH: 30 pg (ref 26.6–33.0)
MCHC: 33.6 g/dL (ref 31.5–35.7)
MCV: 89 fL (ref 79–97)
Monocytes Absolute: 0.5 10*3/uL (ref 0.1–0.9)
Monocytes: 5 %
Neutrophils Absolute: 7.9 10*3/uL — ABNORMAL HIGH (ref 1.4–7.0)
Neutrophils: 75 %
Platelets: 312 10*3/uL (ref 150–450)
RBC: 4.84 x10E6/uL (ref 3.77–5.28)
RDW: 12.5 % (ref 11.7–15.4)
WBC: 10.4 10*3/uL (ref 3.4–10.8)

## 2020-07-13 LAB — LYME AB/WESTERN BLOT REFLEX
LYME DISEASE AB, QUANT, IGM: <0.8 index (ref 0.00–0.79)
Lyme IgG/IgM Ab: <0.91 {ISR} (ref 0.00–0.90)

## 2020-07-13 LAB — HEPATITIS C ANTIBODY: Hep C Virus Ab: <0.1 s/co ratio (ref 0.0–0.9)

## 2020-07-13 LAB — TSH: TSH: 2.32 u[IU]/mL (ref 0.450–4.500)

## 2020-07-13 LAB — CK: Total CK: 53 U/L (ref 32–182)

## 2020-07-14 ENCOUNTER — Ambulatory Visit: Payer: No Typology Code available for payment source | Admitting: Allergy & Immunology

## 2020-07-15 ENCOUNTER — Telehealth (INDEPENDENT_AMBULATORY_CARE_PROVIDER_SITE_OTHER): Payer: Self-pay | Admitting: Internal Medicine

## 2020-07-15 ENCOUNTER — Other Ambulatory Visit: Payer: Self-pay | Admitting: Family Medicine

## 2020-07-15 MED ORDER — ALPRAZOLAM 1 MG PO TABS
ORAL_TABLET | ORAL | 0 refills | Status: DC
Start: 2020-07-15 — End: 2020-07-29

## 2020-07-15 MED FILL — ALPRAZolam 1 MG TABS: 1 | 14 days supply | Qty: 14 | Fill #0

## 2020-07-15 NOTE — Telephone Encounter (Signed)
Nurses Dr. Dereck Leep called me regarding this particular patient I tried to call Dr. Lovena Le regarding this issue I sent her a staff message Please inform patient that we sent in 2-week supply of Xanax 1 mg 1 each evening.  This was sent in to [facility].  She may get it today if she wishes. Please explained to the patient that this will give [place] time to work through this issue. (We will need to coordinate this between [facility] and also Dr. Lovena Le) Thanks-Dr. Nicki Reaper

## 2020-07-15 NOTE — Telephone Encounter (Signed)
Pt contacted and verbalized understanding.  

## 2020-07-21 ENCOUNTER — Ambulatory Visit (INDEPENDENT_AMBULATORY_CARE_PROVIDER_SITE_OTHER): Payer: No Typology Code available for payment source

## 2020-07-21 DIAGNOSIS — J309 Allergic rhinitis, unspecified: Secondary | ICD-10-CM

## 2020-07-28 ENCOUNTER — Ambulatory Visit (INDEPENDENT_AMBULATORY_CARE_PROVIDER_SITE_OTHER): Payer: No Typology Code available for payment source

## 2020-07-28 DIAGNOSIS — J309 Allergic rhinitis, unspecified: Secondary | ICD-10-CM

## 2020-07-29 ENCOUNTER — Other Ambulatory Visit: Payer: Self-pay

## 2020-07-29 ENCOUNTER — Other Ambulatory Visit: Payer: Self-pay | Admitting: Family Medicine

## 2020-07-29 ENCOUNTER — Ambulatory Visit (INDEPENDENT_AMBULATORY_CARE_PROVIDER_SITE_OTHER): Payer: No Typology Code available for payment source | Admitting: Family Medicine

## 2020-07-29 ENCOUNTER — Encounter: Payer: Self-pay | Admitting: Family Medicine

## 2020-07-29 VITALS — BP 134/88 | HR 105 | Temp 96.3°F | Ht 66.0 in | Wt 196.0 lb

## 2020-07-29 DIAGNOSIS — F419 Anxiety disorder, unspecified: Secondary | ICD-10-CM | POA: Diagnosis not present

## 2020-07-29 LAB — HM DIABETES EYE EXAM

## 2020-07-29 MED ORDER — ALPRAZOLAM 0.5 MG PO TABS
ORAL_TABLET | ORAL | 0 refills | Status: DC
Start: 2020-07-29 — End: 2020-08-26

## 2020-07-29 MED FILL — ALPRAZolam 0.5 MG TABS: 0.5 | 28 days supply | Qty: 35 | Fill #0

## 2020-07-29 NOTE — Progress Notes (Signed)
Patient ID: Theresa [name], female    DOB: 29-Aug-1970, 49 y.o.   MRN: [id number]   Chief Complaint  Patient presents with  . Establish Care  . Anxiety   Subjective:    HPI  Pt here to establish care with provider and f/u with anxiety. Pt taking Xanax and Celexa for anxiety and depression, since at least in chart from 2013.  Pt seen by Dr. Luan Pulling in past as pcp, then transferred to our office in 2013. Pt stating the history of her getting on these meds was pt is an only child, mother was dying cancer back in 2011, single mother and trying to work and caring for her mother at time.  Pt was needing something for anxiety and her last pcp started her on 1mg  xanax for anxiety.  Then pt stated she was needing more Xanax, the dose went up to 1mg  tid xanax for a few years. Last time saw Hoyle Sauer, NP in summer 2021, they started working on other options for anxiety. She tried Trazodone, klonapin, and tried ativan. No side effects, but pt stating just not feeling like herself on the medications.  Pt tapering plan was to go from 3 per day of 1mg  xanax to 2 tab per day, then went to 1 tab per day, then started cutting it in half. Pt wanted to try to wean down and was wanting to come off of some of the medications. Did virtual visit with Santiago Glad, NP on 07/12/20 and had concerns about fatigue, then went down to 0.5mg  on the Xanax. Then pt decided to try to go down to 0.25mg  and having some fatigue and "feeling bad" that was worsening. Pt was still on celexa 40mg . Back in 2011 started on these celexa and xanax.  Pt called one of her friend's that is a GI specialist, that called Dr. Sallee Lange in our office and discussed the case and concern that pt may be tapering off the Xanax too drastically.  Pt was given 1mg  of xanax qhs to help with sleep.   Pt stating she did feel better going back up on the medication.   Medical History Theresa [name] has a past medical history of Allergy, Allergy to alpha-gal,  Anxiety, Colon adenoma (DEC 2014), Depression, Edema, GERD (gastroesophageal reflux disease), History of abnormal cervical Pap smear (12/02/2013), History of nephrolithiasis, HPV (human papilloma virus) infection, Hyperlipidemia, IBS (irritable bowel syndrome), Insomnia, Obesity (01/14/2014), Pre-diabetes (09/2014), Superficial basal cell carcinoma (BCC) (12/26/2019), and Yeast infection (09/04/2013).   Outpatient Encounter Medications as of 07/29/2020  Medication Sig  . citalopram (CELEXA) 40 MG tablet Take 1 tablet (40 mg total) by mouth daily.  . cycloSPORINE (RESTASIS) 0.05 % ophthalmic emulsion Place 1 drop into both eyes 2 (two) times daily.  Marland Kitchen dicyclomine (BENTYL) 20 MG tablet Take 20 mg by mouth daily as needed for spasms.  Marland Kitchen EPINEPHrine (EPIPEN 2-PAK) 0.3 mg/0.3 mL IJ SOAJ injection Inject 0.3 mLs (0.3 mg total) into the muscle as needed for anaphylaxis.  . fluticasone (FLONASE) 50 MCG/ACT nasal spray INSTILL 1 SPRAY INTO BOTH NOSTRILS TWICE DAILY AS NEEDED FOR ALLERGIES OR RHINITIS  . imiquimod (ALDARA) 5 % cream Apply to affected area nightly M, W, F x 8 weeks  . ipratropium (ATROVENT) 0.06 % nasal spray Place 2 sprays into both nostrils 3 (three) times daily.  Marland Kitchen levocetirizine (XYZAL) 5 MG tablet TAKE 1 TABLET (5 MG TOTAL) BY MOUTH EVERY EVENING.  Marland Kitchen levonorgestrel (MIRENA) 20 MCG/24HR IUD 1 each by Intrauterine  route once.  . prednisoLONE acetate (PRED FORTE) 1 % ophthalmic suspension Place 1 drop into both eyes 2 (two) times daily as needed.  . RABEprazole (ACIPHEX) 20 MG tablet TAKE 1 TABLET (20 MG TOTAL) BY MOUTH DAILY BEFORE BREAKFAST.  . valACYclovir (VALTREX) 1000 MG tablet Take 1,000 mg by mouth daily.  . [DISCONTINUED] ALPRAZolam (XANAX) 1 MG tablet 1 qhs prn insomnia  . ALPRAZolam (XANAX) 0.5 MG tablet Take 1 and 1/2 tab p.o. qhs for 14 days, then decrease to 1 tab p.o. qhs.   No facility-administered encounter medications on file as of 07/29/2020.     Review of Systems   Constitutional: Negative for chills and fever.  HENT: Negative for congestion, rhinorrhea and sore throat.   Respiratory: Negative for cough, shortness of breath and wheezing.   Cardiovascular: Negative for chest pain and leg swelling.  Gastrointestinal: Negative for abdominal pain, diarrhea, nausea and vomiting.  Genitourinary: Negative for dysuria and frequency.  Musculoskeletal: Negative for arthralgias and back pain.  Skin: Negative for rash.  Neurological: Negative for dizziness, weakness and headaches.  Psychiatric/Behavioral: Positive for sleep disturbance (improved with meds). Negative for confusion, dysphoric mood, self-injury and suicidal ideas. The patient is nervous/anxious.      Vitals BP 134/88   Pulse (!) 105   Temp (!) 96.3 F (35.7 C)   Ht 5\' 6"  (1.676 m)   Wt 196 lb (88.9 kg)   SpO2 98%   BMI 31.64 kg/m   Objective:   Physical Exam Vitals and nursing note reviewed.  Constitutional:      General: She is not in acute distress.    Appearance: Normal appearance. She is not ill-appearing.  HENT:     Head: Normocephalic and atraumatic.  Eyes:     Extraocular Movements: Extraocular movements intact.     Conjunctiva/sclera: Conjunctivae normal.     Pupils: Pupils are equal, round, and reactive to light.  Cardiovascular:     Rate and Rhythm: Regular rhythm. Tachycardia present.     Pulses: Normal pulses.     Heart sounds: Normal heart sounds.  Pulmonary:     Effort: Pulmonary effort is normal. No respiratory distress.     Breath sounds: Normal breath sounds. No wheezing, rhonchi or rales.  Musculoskeletal:        General: Normal range of motion.  Skin:    General: Skin is warm and dry.     Findings: No lesion or rash.  Neurological:     General: No focal deficit present.     Mental Status: She is alert and oriented to person, place, and time.  Psychiatric:        Behavior: Behavior normal.        Thought Content: Thought content normal.         Judgment: Judgment normal.     Comments: Anxious mood      Assessment and Plan   1. Anxiety  Anxiety- Pt to continue with celexa 40mg  for depression/anxiety.  Discussed safety concerns of taking long term benzodiazepines.  That there are increased risk of early memory loss/dementia, falls, or confusion.  In order, to give good and safe care we prescribe the lowest effective dose if these medications are needed.   Pt offered a tapering dose or referral to psychiatry.   Pt declining at this time to see psychiatry for anxiety/insomnia.  Pt to let us know if later wanting referral to psychiatry. Pt declining trying something else at this time for sleep. Reviewed controlled medications  are filled every 3 months and require office visits, signed controlled agreement, and yearly UDS.   Pt voiced understanding.  Pt stating she is wishing to taper down on the medication and not wanting to continue with the 1mg  dose and not wanting to see psychiatry.  Recommending a slower tapering dose of 0.75mg  xanax qhs and then after 2 wks, dc to 0.5mg  xanax p.o. qhs.   Call back in 4 wks to see how she's doing.  Pt in agreement with plan.  F/u 4 wks, phone visit.

## 2020-08-04 ENCOUNTER — Ambulatory Visit (INDEPENDENT_AMBULATORY_CARE_PROVIDER_SITE_OTHER): Payer: No Typology Code available for payment source

## 2020-08-04 DIAGNOSIS — J309 Allergic rhinitis, unspecified: Secondary | ICD-10-CM | POA: Diagnosis not present

## 2020-08-11 ENCOUNTER — Ambulatory Visit (INDEPENDENT_AMBULATORY_CARE_PROVIDER_SITE_OTHER): Payer: No Typology Code available for payment source

## 2020-08-11 DIAGNOSIS — J309 Allergic rhinitis, unspecified: Secondary | ICD-10-CM

## 2020-08-18 ENCOUNTER — Ambulatory Visit (INDEPENDENT_AMBULATORY_CARE_PROVIDER_SITE_OTHER): Payer: No Typology Code available for payment source

## 2020-08-18 DIAGNOSIS — J309 Allergic rhinitis, unspecified: Secondary | ICD-10-CM

## 2020-08-18 NOTE — Progress Notes (Signed)
VIALS EXP 08-18-21 

## 2020-08-19 DIAGNOSIS — J3081 Allergic rhinitis due to animal (cat) (dog) hair and dander: Secondary | ICD-10-CM | POA: Diagnosis not present

## 2020-08-26 ENCOUNTER — Other Ambulatory Visit: Payer: Self-pay | Admitting: Family Medicine

## 2020-08-26 ENCOUNTER — Other Ambulatory Visit: Payer: Self-pay

## 2020-08-26 ENCOUNTER — Telehealth (INDEPENDENT_AMBULATORY_CARE_PROVIDER_SITE_OTHER): Payer: No Typology Code available for payment source | Admitting: Family Medicine

## 2020-08-26 ENCOUNTER — Other Ambulatory Visit: Payer: No Typology Code available for payment source

## 2020-08-26 ENCOUNTER — Telehealth: Payer: Self-pay | Admitting: Family Medicine

## 2020-08-26 DIAGNOSIS — F32A Depression, unspecified: Secondary | ICD-10-CM

## 2020-08-26 DIAGNOSIS — U071 COVID-19: Secondary | ICD-10-CM

## 2020-08-26 DIAGNOSIS — F419 Anxiety disorder, unspecified: Secondary | ICD-10-CM

## 2020-08-26 MED ORDER — CITALOPRAM HYDROBROMIDE 40 MG PO TABS: 40.0000 mg | ORAL_TABLET | Freq: Every day | ORAL | 0 refills | Status: DC

## 2020-08-26 MED ORDER — ALPRAZOLAM 0.5 MG PO TABS: ORAL_TABLET | ORAL | 2 refills | Status: DC

## 2020-08-26 MED FILL — ALPRAZolam 0.5 MG TABS: 0.5 | 30 days supply | Qty: 30 | Fill #0

## 2020-08-26 NOTE — Progress Notes (Signed)
Patient ID: Theresa [name], female    DOB: 03-16-1971, 50 y.o.   MRN: [id number]  Virtual Visit via Telephone Note  I connected with Theresa [name] on 08/26/20 at  1:50 PM EST by telephone and verified that I am speaking with the correct person using two identifiers.  Location: Patient: home Provider: office   I discussed the limitations, risks, security and privacy concerns of performing an evaluation and management service by telephone and the availability of in person appointments. I also discussed with the patient that there may be a patient responsible charge related to this service. The patient expressed understanding and agreed to proceed.   Chief Complaint  Patient presents with  . Anxiety   Subjective:    HPI Pt here for follow up on anxiety. Pt states she is doing "OK." Down to 0.5mg  xanax at night. Taking 40 mg Celexa daily.   Pt has covid- having dry cough starting 4 days ago. Worsening on Monday and tested positive on Tuesday.   Intermittent cough, but not keeping her up at night.  Taking zinc, vit c.  Taking immune capsule.  Drinking fluids.  Anxiety- 2nd week on 1/2 tab of xanax. 0.5mg  xanax qhs.  Feels she is sleeping well and not feeling fatigue or groggy during the day.  Medical History Theresa [name] has a past medical history of Allergy, Allergy to alpha-gal, Anxiety, Colon adenoma (DEC 2014), Depression, Edema, GERD (gastroesophageal reflux disease), History of abnormal cervical Pap smear (12/02/2013), History of nephrolithiasis, HPV (human papilloma virus) infection, Hyperlipidemia, IBS (irritable bowel syndrome), Insomnia, Obesity (01/14/2014), Pre-diabetes (09/2014), Superficial basal cell carcinoma (BCC) (12/26/2019), and Yeast infection (09/04/2013).   Outpatient Encounter Medications as of 08/26/2020  Medication Sig  . cycloSPORINE (RESTASIS) 0.05 % ophthalmic emulsion Place 1 drop into both eyes 2 (two) times daily.  Marland Kitchen dicyclomine (BENTYL) 20 MG tablet Take 20 mg  by mouth daily as needed for spasms.  Marland Kitchen EPINEPHrine (EPIPEN 2-PAK) 0.3 mg/0.3 mL IJ SOAJ injection Inject 0.3 mLs (0.3 mg total) into the muscle as needed for anaphylaxis.  . fluticasone (FLONASE) 50 MCG/ACT nasal spray INSTILL 1 SPRAY INTO BOTH NOSTRILS TWICE DAILY AS NEEDED FOR ALLERGIES OR RHINITIS  . imiquimod (ALDARA) 5 % cream Apply to affected area nightly M, W, F x 8 weeks  . ipratropium (ATROVENT) 0.06 % nasal spray Place 2 sprays into both nostrils 3 (three) times daily.  Marland Kitchen levocetirizine (XYZAL) 5 MG tablet TAKE 1 TABLET (5 MG TOTAL) BY MOUTH EVERY EVENING.  Marland Kitchen levonorgestrel (MIRENA) 20 MCG/24HR IUD 1 each by Intrauterine route once.  . prednisoLONE acetate (PRED FORTE) 1 % ophthalmic suspension Place 1 drop into both eyes 2 (two) times daily as needed.  . RABEprazole (ACIPHEX) 20 MG tablet TAKE 1 TABLET (20 MG TOTAL) BY MOUTH DAILY BEFORE BREAKFAST.  . valACYclovir (VALTREX) 1000 MG tablet Take 1,000 mg by mouth daily.  . [DISCONTINUED] ALPRAZolam (XANAX) 0.5 MG tablet Take 1 and 1/2 tab p.o. qhs for 14 days, then decrease to 1 tab p.o. qhs.  . [DISCONTINUED] citalopram (CELEXA) 40 MG tablet Take 1 tablet (40 mg total) by mouth daily.  Marland Kitchen ALPRAZolam (XANAX) 0.5 MG tablet Take 1 tab p.o. qhs prn insomnia.  . citalopram (CELEXA) 40 MG tablet Take 1 tablet (40 mg total) by mouth daily.   No facility-administered encounter medications on file as of 08/26/2020.     Review of Systems  Constitutional: Negative for chills and fever.  HENT: Positive for congestion. Negative  for ear pain, rhinorrhea, sinus pressure, sinus pain and sore throat.   Eyes: Negative for pain, discharge and itching.  Respiratory: Positive for cough.   Gastrointestinal: Negative for constipation, diarrhea, nausea and vomiting.  Psychiatric/Behavioral: Negative for dysphoric mood, self-injury, sleep disturbance and suicidal ideas. The patient is nervous/anxious (improved).      Vitals There were no vitals taken  for this visit.  Objective:   Physical Exam  No PE due to phone visit.  Assessment and Plan   1. Anxiety and depression - citalopram (CELEXA) 40 MG tablet; Take 1 tablet (40 mg total) by mouth daily.  Dispense: 90 tablet; Refill: 0 - ALPRAZolam (XANAX) 0.5 MG tablet; Take 1 tab p.o. qhs prn insomnia.  Dispense: 30 tablet; Refill: 2  2. COVID-19   Pt has covid and has had symptoms about 4 days.   Mild coughing.  Cont vitamins and otc cough syrup and fluids. Tylenol/ibuprofen and mucinex prn. Cont vit c, vit d, and zinc. Call if worsening sob/fever/coughing.  Anxiety- stable. Doing well at 0.5mg  xanax qhs.  Not having side effects at this time.  Not having fatigue during the day and worsening anxiety. Will cont her at this dose and pt stating she will try to occ take 1/2 tab to see how she feels.   F/u 3 mo or prn.   Follow Up Instructions:    I discussed the assessment and treatment plan with the patient. The patient was provided an opportunity to ask questions and all were answered. The patient agreed with the plan and demonstrated an understanding of the instructions.   The patient was advised to call back or seek an in-person evaluation if the symptoms worsen or if the condition fails to improve as anticipated.  I provided 15 minutes of non-face-to-face time during this encounter.

## 2020-08-26 NOTE — Telephone Encounter (Signed)
Ms. Theresa [name], Theresa [name] are scheduled for a virtual visit with your provider today.    Just as we do with appointments in the office, we must obtain your consent to participate.  Your consent will be active for this visit and any virtual visit you may have with one of our providers in the next 365 days.    If you have a MyChart account, I can also send a copy of this consent to you electronically.  All virtual visits are billed to your insurance company just like a traditional visit in the office.  As this is a virtual visit, video technology does not allow for your provider to perform a traditional examination.  This may limit your provider's ability to fully assess your condition.  If your provider identifies any concerns that need to be evaluated in person or the need to arrange testing such as labs, EKG, etc, we will make arrangements to do so.    Although advances in technology are sophisticated, we cannot ensure that it will always work on either your end or our end.  If the connection with a video visit is poor, we may have to switch to a telephone visit.  With either a video or telephone visit, we are not always able to ensure that we have a secure connection.   I need to obtain your verbal consent now.   Are you willing to proceed with your visit today?   Theresa [name] has provided verbal consent on 08/26/2020 for a virtual visit (video or telephone).   Vicente Males, LPN [id number]  6:96 PM

## 2020-08-27 ENCOUNTER — Ambulatory Visit: Payer: No Typology Code available for payment source | Admitting: Allergy & Immunology

## 2020-09-01 ENCOUNTER — Ambulatory Visit (INDEPENDENT_AMBULATORY_CARE_PROVIDER_SITE_OTHER): Payer: No Typology Code available for payment source

## 2020-09-01 DIAGNOSIS — J309 Allergic rhinitis, unspecified: Secondary | ICD-10-CM | POA: Diagnosis not present

## 2020-09-05 ENCOUNTER — Encounter: Payer: Self-pay | Admitting: Family Medicine

## 2020-09-08 ENCOUNTER — Ambulatory Visit (INDEPENDENT_AMBULATORY_CARE_PROVIDER_SITE_OTHER): Payer: No Typology Code available for payment source

## 2020-09-08 DIAGNOSIS — J309 Allergic rhinitis, unspecified: Secondary | ICD-10-CM

## 2020-09-08 MED ORDER — ALBUTEROL SULFATE HFA 108 (90 BASE) MCG/ACT IN AERS: 2.0000 | INHALATION_SPRAY | RESPIRATORY_TRACT | 0 refills | Status: DC | PRN

## 2020-09-09 NOTE — Progress Notes (Signed)
Vials will need diluting in RV

## 2020-09-22 ENCOUNTER — Ambulatory Visit (INDEPENDENT_AMBULATORY_CARE_PROVIDER_SITE_OTHER): Payer: No Typology Code available for payment source

## 2020-09-22 ENCOUNTER — Other Ambulatory Visit (INDEPENDENT_AMBULATORY_CARE_PROVIDER_SITE_OTHER): Payer: Self-pay | Admitting: Gastroenterology

## 2020-09-22 ENCOUNTER — Other Ambulatory Visit (INDEPENDENT_AMBULATORY_CARE_PROVIDER_SITE_OTHER): Payer: Self-pay | Admitting: Internal Medicine

## 2020-09-22 DIAGNOSIS — K219 Gastro-esophageal reflux disease without esophagitis: Secondary | ICD-10-CM

## 2020-09-22 DIAGNOSIS — J309 Allergic rhinitis, unspecified: Secondary | ICD-10-CM | POA: Diagnosis not present

## 2020-09-22 MED ORDER — LEVOCETIRIZINE DIHYDROCHLORIDE 5 MG PO TABS: 5.0000 mg | ORAL_TABLET | Freq: Every evening | ORAL | 0 refills | Status: DC

## 2020-09-22 MED FILL — LEVOCETIRIZINE 5 MG TABLET: 5 | 90 days supply | Qty: 90 | Fill #0

## 2020-09-22 MED FILL — CITALOPRAM HBR 40 MG TABLET: 40 | 90 days supply | Qty: 90 | Fill #0

## 2020-09-22 MED FILL — RABEPRAZOLE SOD DR 20 MG TA: 20 | 90 days supply | Qty: 90 | Fill #0

## 2020-09-22 MED FILL — FLUTICASONE PROP 50 MCG SPR: 50 | 30 days supply | Qty: 16 | Fill #3

## 2020-09-22 NOTE — Addendum Note (Signed)
Addended by: Lucrezia Starch I on: 09/22/2020 04:22 PM   Modules accepted: Orders

## 2020-09-27 ENCOUNTER — Other Ambulatory Visit: Payer: Self-pay

## 2020-09-27 ENCOUNTER — Encounter: Payer: Self-pay | Admitting: Allergy & Immunology

## 2020-09-27 ENCOUNTER — Other Ambulatory Visit: Payer: Self-pay | Admitting: Allergy & Immunology

## 2020-09-27 MED ORDER — PREDNISONE 10 MG PO TABS: 10.0000 mg | ORAL_TABLET | Freq: Every day | ORAL | 0 refills | Status: DC

## 2020-09-27 NOTE — Telephone Encounter (Signed)
Please advise to refill of prednisone

## 2020-09-28 ENCOUNTER — Ambulatory Visit (INDEPENDENT_AMBULATORY_CARE_PROVIDER_SITE_OTHER): Payer: No Typology Code available for payment source | Admitting: Family Medicine

## 2020-09-28 ENCOUNTER — Other Ambulatory Visit: Payer: Self-pay

## 2020-09-28 ENCOUNTER — Encounter: Payer: Self-pay | Admitting: Family Medicine

## 2020-09-28 ENCOUNTER — Encounter: Payer: Self-pay | Admitting: Allergy & Immunology

## 2020-09-28 VITALS — HR 86 | Temp 98.2°F | Resp 16

## 2020-09-28 DIAGNOSIS — J020 Streptococcal pharyngitis: Secondary | ICD-10-CM

## 2020-09-28 DIAGNOSIS — B999 Unspecified infectious disease: Secondary | ICD-10-CM

## 2020-09-28 DIAGNOSIS — J029 Acute pharyngitis, unspecified: Secondary | ICD-10-CM

## 2020-09-28 LAB — POCT RAPID STREP A (OFFICE): Rapid Strep A Screen: POSITIVE — AB

## 2020-09-28 MED ORDER — PENICILLIN V POTASSIUM 500 MG PO TABS: 500.0000 mg | ORAL_TABLET | Freq: Three times a day (TID) | ORAL | 0 refills | Status: DC

## 2020-09-28 NOTE — Patient Instructions (Signed)
Strep Throat, Adult Strep throat is an infection of the throat. It is caused by germs (bacteria). Strep throat is common during the cold months of the year. It mostly affects children who are 5-49 years old. However, people of all ages can get it at any time of the year. When strep throat affects the tonsils, it is called tonsillitis. When it affects the back of the throat, it is called pharyngitis. This infection spreads from person to person through coughing, sneezing, or having close contact. What are the causes? This condition is caused by the Streptococcus pyogenes germ. What increases the risk? You are more likely to develop this condition if:  You care for young children. Children are more likely to get strep throat and may spread it to others.  You go to crowded places. Germs can spread easily in such places.  You kiss or touch someone who has strep throat. What are the signs or symptoms? Symptoms of this condition include:  Fever or chills.  Redness, swelling, or pain in the tonsils or throat.  Pain or trouble when swallowing.  White or yellow spots on the tonsils or throat.  Tender glands in the neck and under the jaw.  Bad breath.  Red rash all over the body. This is rare. How is this treated? This condition may be treated with:  Medicines that kill germs (antibiotics).  Medicines that treat pain or fever. These include: ? Ibuprofen or acetaminophen. ? Aspirin, only for patients who are over the age of 18. ? Throat lozenges. ? Throat sprays. Follow these instructions at home: Medicines  Take over-the-counter and prescription medicines only as told by your doctor.  Take your antibiotic medicine as told by your doctor. Do not stop taking the antibiotic even if you start to feel better.   Eating and drinking  If you have trouble swallowing, eat soft foods until your throat feels better.  Drink enough fluid to keep your pee (urine) pale yellow.  To help with  pain, you may have: ? Warm fluids, such as soup and tea. ? Cold fluids, such as frozen desserts or popsicles.   General instructions  Rinse your mouth (gargle) with a salt-water mixture 3-4 times a day or as needed. To make a salt-water mixture, dissolve -1 tsp (3-6 g) of salt in 1 cup (237 mL) of warm water.  Rest as much as you can.  Stay home from work or school until you have been taking antibiotics for 24 hours.  Avoid smoking or being around people who smoke.  Keep all follow-up visits as told by your doctor. This is important. How is this prevented?  Do not share food, drinking cups, or personal items. They can cause the germs to spread.  Wash your hands well with soap and water. Make sure that all people in your house wash their hands well.  Have family members tested if they have a fever or a sore throat. They may need an antibiotic if they have strep throat.   Contact a doctor if:  You have swelling in your neck that keeps getting bigger.  You get a rash, cough, or earache.  You cough up a thick fluid that is green, yellow-brown, or bloody.  You have pain that does not get better with medicine.  Your symptoms get worse instead of getting better.  You have a fever. Get help right away if:  You vomit.  You have a very bad headache.  Your neck hurts or feels stiff.    You have chest pain or are short of breath.  You have drooling, very bad throat pain, or changes in your voice.  Your neck is swollen, or the skin gets red and tender.  Your mouth is dry, or you are peeing less than normal.  You keep feeling more tired or have trouble waking up.  Your joints are red or painful. Summary  Strep throat is an infection of the throat. It is caused by germs (bacteria).  This infection can spread from person to person through coughing, sneezing, or having close contact.  Take your medicines, including antibiotics, as told by your doctor. Do not stop taking the  antibiotic even if you start to feel better.  To prevent the spread of germs, wash your hands well with soap and water. Have others do the same. Do not share food, drinking cups, or personal items.  Get help right away if you have a bad headache, chest pain, shortness of breath, a stiff or painful neck, or you vomit. This information is not intended to replace advice given to you by your health care provider. Make sure you discuss any questions you have with your health care provider. Document Revised: 10/18/2018 Document Reviewed: 10/18/2018 Elsevier Patient Education  2021 Elsevier Inc.  

## 2020-09-28 NOTE — Progress Notes (Signed)
Patient presents today with respiratory illness Number of days present- 3 days   Symptoms include-had COVID one month ago and has not felt good since; sore throat, dry cough, short of breath, chest discomfort, malaise, sleeping all day today, headache  Presence of worrisome signs (severe shortness of breath, lethargy, etc.) - short of breath and discomfort   Recent/current visit to urgent care or ER- none (had antigen test done at hospital about 10 min ago)  Recent direct exposure to Covid- no  Any current Covid testing-antigen test     Patient ID: Theresa [name], female    DOB: Jun 25, 1971, 50 y.o.   MRN: [id number]   Chief Complaint  Patient presents with  . Fatigue   Subjective:  CC: sore throat, dry cough, headache and fatigue   This is a new problem.  Presents today for an acute visit with complaint of sore throat, dry cough, shortness of breath, chest discomfort, malaise, fatigue, and headache.  Reports had Covid infection in January, and just has not felt good since.  Symptoms have been worse in the last 3 days.  Symptoms are similar to when she had a Covid infection in January.  Just does not feel like herself.  Has tried Tylenol, Motrin, Flonase, Atrovent, and an inhaler.  Sees Dr. Ernst Bowler, allergist, notified him yesterday and he prescribed prednisone, she has not started taking this medication yet.  Reports that she saw employee health they got it negative antigen Covid test. Denies fever.  Positive for chills and fatigue and other symptoms as stated above.    Medical History Nelia has a past medical history of Allergy, Allergy to alpha-gal, Anxiety, Colon adenoma (DEC 2014), Depression, Edema, GERD (gastroesophageal reflux disease), History of abnormal cervical Pap smear (12/02/2013), History of nephrolithiasis, HPV (human papilloma virus) infection, Hyperlipidemia, IBS (irritable bowel syndrome), Insomnia, Obesity (01/14/2014), Pre-diabetes (09/2014), Superficial basal cell  carcinoma (BCC) (12/26/2019), and Yeast infection (09/04/2013).   Outpatient Encounter Medications as of 09/28/2020  Medication Sig  . albuterol (VENTOLIN HFA) 108 (90 Base) MCG/ACT inhaler Inhale 2 puffs into the lungs every 4 (four) hours as needed for wheezing or shortness of breath.  . ALPRAZolam (XANAX) 0.5 MG tablet Take 1 tab p.o. qhs prn insomnia.  . citalopram (CELEXA) 40 MG tablet Take 1 tablet (40 mg total) by mouth daily.  . cycloSPORINE (RESTASIS) 0.05 % ophthalmic emulsion Place 1 drop into both eyes 2 (two) times daily.  Marland Kitchen dicyclomine (BENTYL) 20 MG tablet Take 20 mg by mouth daily as needed for spasms.  Marland Kitchen EPINEPHrine (EPIPEN 2-PAK) 0.3 mg/0.3 mL IJ SOAJ injection Inject 0.3 mLs (0.3 mg total) into the muscle as needed for anaphylaxis.  . fluticasone (FLONASE) 50 MCG/ACT nasal spray INSTILL 1 SPRAY INTO BOTH NOSTRILS TWICE DAILY AS NEEDED FOR ALLERGIES OR RHINITIS  . imiquimod (ALDARA) 5 % cream Apply to affected area nightly M, W, F x 8 weeks  . ipratropium (ATROVENT) 0.06 % nasal spray Place 2 sprays into both nostrils 3 (three) times daily.  Marland Kitchen levocetirizine (XYZAL) 5 MG tablet Take 1 tablet (5 mg total) by mouth every evening.  Marland Kitchen levonorgestrel (MIRENA) 20 MCG/24HR IUD 1 each by Intrauterine route once.  . penicillin v potassium (VEETID) 500 MG tablet Take 1 tablet (500 mg total) by mouth 3 (three) times daily.  . prednisoLONE acetate (PRED FORTE) 1 % ophthalmic suspension Place 1 drop into both eyes 2 (two) times daily as needed.  . predniSONE (DELTASONE) 10 MG tablet Take 1 tablet (10  mg total) by mouth daily with breakfast.  . RABEprazole (ACIPHEX) 20 MG tablet TAKE 1 TABLET BY MOUTH DAILY BEFORE BREAKFAST.  . valACYclovir (VALTREX) 1000 MG tablet Take 1,000 mg by mouth daily.   No facility-administered encounter medications on file as of 09/28/2020.     Review of Systems  Constitutional: Positive for chills and fatigue. Negative for fever.  HENT: Positive for ear pain,  postnasal drip and sore throat. Negative for congestion.   Respiratory: Positive for cough, chest tightness and shortness of breath. Negative for wheezing.   Cardiovascular: Positive for chest pain.       Inspiration  Gastrointestinal: Positive for nausea. Negative for abdominal pain, diarrhea and vomiting.  Musculoskeletal: Positive for myalgias.  Neurological: Positive for dizziness, light-headedness and headaches.     Vitals Pulse 86   Temp 98.2 F (36.8 C)   Resp 16   SpO2 98%   Objective:   Physical Exam Vitals reviewed.  Constitutional:      General: She is not in acute distress.    Appearance: Normal appearance.  HENT:     Right Ear: Tympanic membrane normal.     Left Ear: Tympanic membrane normal.     Nose:     Right Turbinates: Not swollen.     Left Turbinates: Not swollen.     Right Sinus: Maxillary sinus tenderness and frontal sinus tenderness present.     Left Sinus: Maxillary sinus tenderness and frontal sinus tenderness present.     Mouth/Throat:     Mouth: Mucous membranes are moist.     Pharynx: Posterior oropharyngeal erythema present.     Tonsils: 1+ on the right. 1+ on the left.  Cardiovascular:     Rate and Rhythm: Normal rate and regular rhythm.     Heart sounds: Normal heart sounds.  Pulmonary:     Effort: Pulmonary effort is normal.     Breath sounds: Normal breath sounds.  Skin:    General: Skin is warm and dry.  Neurological:     General: No focal deficit present.     Mental Status: She is alert.  Psychiatric:        Behavior: Behavior normal.    Results for orders placed or performed in visit on 09/28/20  POCT rapid strep A  Result Value Ref Range   Rapid Strep A Screen Positive (A) Negative     Assessment and Plan   1. Sore throat - POCT rapid strep A  2. Streptococcal sore throat - penicillin v potassium (VEETID) 500 MG tablet; Take 1 tablet (500 mg total) by mouth 3 (three) times daily.  Dispense: 30 tablet; Refill: 0    Rapid strep positive.  Will treat with antibiotic for 10 days.  Recommend supportive therapy, adequate hydration.  Agrees with plan of care discussed today. Understands warning signs to seek further care: chest pain, shortness of breath, any significant change in health.  Understands to follow-up if symptoms do not improve, or worsen.  Will refer to post Covid clinic if this infection does not help improve her symptoms.     Chalmers Guest, NP 09/28/2020

## 2020-09-29 NOTE — Telephone Encounter (Signed)
Patient is wanting a call regarding other issues. Do you want to schedule an appointment with her. Please advise

## 2020-09-29 NOTE — Addendum Note (Signed)
Addended by: Valentina Shaggy on: 09/29/2020 03:53 PM   Modules accepted: Orders

## 2020-10-02 ENCOUNTER — Encounter: Payer: Self-pay | Admitting: Allergy & Immunology

## 2020-10-04 ENCOUNTER — Encounter: Payer: Self-pay | Admitting: Allergy & Immunology

## 2020-10-05 ENCOUNTER — Ambulatory Visit: Payer: No Typology Code available for payment source | Admitting: Physician Assistant

## 2020-10-06 ENCOUNTER — Ambulatory Visit (INDEPENDENT_AMBULATORY_CARE_PROVIDER_SITE_OTHER): Payer: No Typology Code available for payment source

## 2020-10-06 DIAGNOSIS — J309 Allergic rhinitis, unspecified: Secondary | ICD-10-CM | POA: Diagnosis not present

## 2020-10-13 ENCOUNTER — Ambulatory Visit (INDEPENDENT_AMBULATORY_CARE_PROVIDER_SITE_OTHER): Payer: No Typology Code available for payment source

## 2020-10-13 DIAGNOSIS — J309 Allergic rhinitis, unspecified: Secondary | ICD-10-CM

## 2020-10-13 MED FILL — ALPRAZolam 0.5 MG TABS: 0.5 | 30 days supply | Qty: 30 | Fill #1

## 2020-10-15 ENCOUNTER — Encounter: Payer: Self-pay | Admitting: Allergy & Immunology

## 2020-10-15 LAB — IGG, IGA, IGM
IgA/Immunoglobulin A, Serum: 241 mg/dL (ref 87–352)
IgG (Immunoglobin G), Serum: 1115 mg/dL (ref 586–1602)
IgM (Immunoglobulin M), Srm: 189 mg/dL (ref 26–217)

## 2020-10-15 LAB — CBC WITH DIFFERENTIAL
Basophils Absolute: 0.1 10*3/uL (ref 0.0–0.2)
Basos: 1 %
EOS (ABSOLUTE): 0.3 10*3/uL (ref 0.0–0.4)
Eos: 4 %
Hematocrit: 39.6 % (ref 34.0–46.6)
Hemoglobin: 13.5 g/dL (ref 11.1–15.9)
Immature Grans (Abs): 0 10*3/uL (ref 0.0–0.1)
Immature Granulocytes: 0 %
Lymphocytes Absolute: 1.8 10*3/uL (ref 0.7–3.1)
Lymphs: 19 %
MCH: 30.1 pg (ref 26.6–33.0)
MCHC: 34.1 g/dL (ref 31.5–35.7)
MCV: 88 fL (ref 79–97)
Monocytes Absolute: 0.5 10*3/uL (ref 0.1–0.9)
Monocytes: 5 %
Neutrophils Absolute: 6.7 10*3/uL (ref 1.4–7.0)
Neutrophils: 71 %
RBC: 4.49 x10E6/uL (ref 3.77–5.28)
RDW: 12.4 % (ref 11.7–15.4)
WBC: 9.4 10*3/uL (ref 3.4–10.8)

## 2020-10-15 LAB — STREP PNEUMONIAE 23 SEROTYPES IGG
Pneumo Ab Type 1*: 0.9 ug/mL — ABNORMAL LOW (ref >1.3)
Pneumo Ab Type 12 (12F)*: <0.1 ug/mL — ABNORMAL LOW (ref >1.3)
Pneumo Ab Type 14*: 0.3 ug/mL — ABNORMAL LOW (ref >1.3)
Pneumo Ab Type 17 (17F)*: 0.6 ug/mL — ABNORMAL LOW (ref >1.3)
Pneumo Ab Type 19 (19F)*: 0.3 ug/mL — ABNORMAL LOW (ref >1.3)
Pneumo Ab Type 2*: 1.1 ug/mL — ABNORMAL LOW (ref >1.3)
Pneumo Ab Type 20*: 2.7 ug/mL (ref >1.3)
Pneumo Ab Type 22 (22F)*: 0.3 ug/mL — ABNORMAL LOW (ref >1.3)
Pneumo Ab Type 23 (23F)*: 0.6 ug/mL — ABNORMAL LOW (ref >1.3)
Pneumo Ab Type 26 (6B)*: 0.3 ug/mL — ABNORMAL LOW (ref >1.3)
Pneumo Ab Type 3*: 3 ug/mL (ref >1.3)
Pneumo Ab Type 34 (10A)*: 0.6 ug/mL — ABNORMAL LOW (ref >1.3)
Pneumo Ab Type 4*: 0.4 ug/mL — ABNORMAL LOW (ref >1.3)
Pneumo Ab Type 43 (11A)*: 2.4 ug/mL (ref >1.3)
Pneumo Ab Type 5*: 2.7 ug/mL (ref >1.3)
Pneumo Ab Type 51 (7F)*: 0.3 ug/mL — ABNORMAL LOW (ref >1.3)
Pneumo Ab Type 54 (15B)*: <0.1 ug/mL — ABNORMAL LOW (ref >1.3)
Pneumo Ab Type 56 (18C)*: 1.8 ug/mL (ref >1.3)
Pneumo Ab Type 57 (19A)*: 3.7 ug/mL (ref >1.3)
Pneumo Ab Type 68 (9V)*: 0.4 ug/mL — ABNORMAL LOW (ref >1.3)
Pneumo Ab Type 70 (33F)*: 0.5 ug/mL — ABNORMAL LOW (ref >1.3)
Pneumo Ab Type 8*: 0.3 ug/mL — ABNORMAL LOW (ref >1.3)
Pneumo Ab Type 9 (9N)*: 0.2 ug/mL — ABNORMAL LOW (ref >1.3)

## 2020-10-15 LAB — COMPLEMENT, TOTAL: Compl, Total (CH50): >60 U/mL (ref >41)

## 2020-10-15 LAB — DIPHTHERIA / TETANUS ANTIBODY PANEL
Diphtheria Ab: 1.04 IU/mL (ref <0.10)
Tetanus Ab, IgG: 3.08 IU/mL (ref <0.10)

## 2020-10-19 ENCOUNTER — Ambulatory Visit (INDEPENDENT_AMBULATORY_CARE_PROVIDER_SITE_OTHER): Payer: No Typology Code available for payment source | Admitting: Internal Medicine

## 2020-10-20 ENCOUNTER — Encounter: Payer: Self-pay | Admitting: Allergy & Immunology

## 2020-10-20 ENCOUNTER — Other Ambulatory Visit (HOSPITAL_COMMUNITY): Payer: Self-pay | Admitting: Obstetrics and Gynecology

## 2020-10-20 ENCOUNTER — Ambulatory Visit (INDEPENDENT_AMBULATORY_CARE_PROVIDER_SITE_OTHER): Payer: No Typology Code available for payment source

## 2020-10-20 DIAGNOSIS — J309 Allergic rhinitis, unspecified: Secondary | ICD-10-CM | POA: Diagnosis not present

## 2020-10-20 MED FILL — valACYclovir HCL 1 GM TABS: 1 | 30 days supply | Qty: 30 | Fill #0

## 2020-10-22 ENCOUNTER — Other Ambulatory Visit: Payer: Self-pay

## 2020-10-22 ENCOUNTER — Ambulatory Visit: Payer: Self-pay

## 2020-10-22 DIAGNOSIS — B999 Unspecified infectious disease: Secondary | ICD-10-CM

## 2020-10-25 ENCOUNTER — Encounter: Payer: Self-pay | Admitting: Allergy & Immunology

## 2020-10-27 ENCOUNTER — Ambulatory Visit: Payer: Self-pay

## 2020-10-27 ENCOUNTER — Ambulatory Visit (INDEPENDENT_AMBULATORY_CARE_PROVIDER_SITE_OTHER): Payer: No Typology Code available for payment source | Admitting: *Deleted

## 2020-10-27 DIAGNOSIS — J309 Allergic rhinitis, unspecified: Secondary | ICD-10-CM

## 2020-11-03 ENCOUNTER — Ambulatory Visit (INDEPENDENT_AMBULATORY_CARE_PROVIDER_SITE_OTHER): Payer: No Typology Code available for payment source

## 2020-11-03 DIAGNOSIS — J309 Allergic rhinitis, unspecified: Secondary | ICD-10-CM | POA: Diagnosis not present

## 2020-11-03 MED FILL — FLUTICASONE PROP 50 MCG SPR: 50 | 30 days supply | Qty: 16 | Fill #4

## 2020-11-10 ENCOUNTER — Ambulatory Visit (INDEPENDENT_AMBULATORY_CARE_PROVIDER_SITE_OTHER): Payer: No Typology Code available for payment source

## 2020-11-10 DIAGNOSIS — J309 Allergic rhinitis, unspecified: Secondary | ICD-10-CM | POA: Diagnosis not present

## 2020-11-13 ENCOUNTER — Other Ambulatory Visit (HOSPITAL_COMMUNITY): Payer: Self-pay

## 2020-11-15 ENCOUNTER — Other Ambulatory Visit (HOSPITAL_COMMUNITY): Payer: Self-pay

## 2020-11-15 MED FILL — Alprazolam Tab 0.5 MG: ORAL | 30 days supply | Qty: 30 | Fill #0 | Status: AC

## 2020-11-16 ENCOUNTER — Other Ambulatory Visit (HOSPITAL_COMMUNITY): Payer: Self-pay

## 2020-11-16 MED ORDER — CYCLOSPORINE 0.05 % OP EMUL
1.0000 [drp] | Freq: Two times a day (BID) | OPHTHALMIC | 4 refills | Status: DC
  Filled 2020-11-16: qty 24, 30d supply, fill #0

## 2020-11-17 ENCOUNTER — Ambulatory Visit (INDEPENDENT_AMBULATORY_CARE_PROVIDER_SITE_OTHER): Payer: No Typology Code available for payment source

## 2020-11-17 DIAGNOSIS — J309 Allergic rhinitis, unspecified: Secondary | ICD-10-CM

## 2020-11-18 ENCOUNTER — Other Ambulatory Visit (HOSPITAL_COMMUNITY): Payer: Self-pay

## 2020-11-19 ENCOUNTER — Other Ambulatory Visit (HOSPITAL_COMMUNITY): Payer: Self-pay

## 2020-11-19 ENCOUNTER — Other Ambulatory Visit: Payer: Self-pay

## 2020-11-19 NOTE — Progress Notes (Unsigned)
Immunotherapy   Patient Details  Name: Theresa [name] MRN: [id number] Date of Birth: 11-05-1970  11/19/2020  Theresa [name] received influenza injection in office and waited 30 minutes without any reactions. Lot# [id number] Exp Date 02/11/2021    Isabel Caprice 11/19/2020, 4:36 PM

## 2020-11-23 ENCOUNTER — Other Ambulatory Visit (HOSPITAL_COMMUNITY): Payer: Self-pay

## 2020-11-24 ENCOUNTER — Ambulatory Visit (INDEPENDENT_AMBULATORY_CARE_PROVIDER_SITE_OTHER): Payer: No Typology Code available for payment source

## 2020-11-24 DIAGNOSIS — J309 Allergic rhinitis, unspecified: Secondary | ICD-10-CM

## 2020-11-26 ENCOUNTER — Other Ambulatory Visit (HOSPITAL_COMMUNITY): Payer: Self-pay

## 2020-12-03 ENCOUNTER — Ambulatory Visit (INDEPENDENT_AMBULATORY_CARE_PROVIDER_SITE_OTHER): Payer: No Typology Code available for payment source | Admitting: *Deleted

## 2020-12-03 DIAGNOSIS — J309 Allergic rhinitis, unspecified: Secondary | ICD-10-CM

## 2020-12-07 ENCOUNTER — Encounter: Payer: Self-pay | Admitting: Allergy & Immunology

## 2020-12-07 NOTE — Telephone Encounter (Signed)
Is she blowing any thing from her nose? Any fevers/ chills? Due to her symptoms of sore throat and general malaise it would be best that she go to an urgent care where she can get tested for strep throat and other tests. Thank you.

## 2020-12-08 ENCOUNTER — Encounter: Payer: Self-pay | Admitting: Emergency Medicine

## 2020-12-08 ENCOUNTER — Other Ambulatory Visit: Payer: Self-pay

## 2020-12-08 ENCOUNTER — Ambulatory Visit
Admission: EM | Admit: 2020-12-08 | Discharge: 2020-12-08 | Disposition: A | Discharge status: Home / Self Care | Payer: No Typology Code available for payment source | Attending: Family Medicine | Admitting: Family Medicine

## 2020-12-08 DIAGNOSIS — J019 Acute sinusitis, unspecified: Secondary | ICD-10-CM | POA: Diagnosis not present

## 2020-12-08 MED ORDER — METHYLPREDNISOLONE SODIUM SUCC 125 MG IJ SOLR
125.0000 mg | Freq: Once | INTRAMUSCULAR | Status: AC
  Administered 2020-12-08: 125 mg via INTRAMUSCULAR

## 2020-12-08 MED ORDER — IPRATROPIUM BROMIDE 0.06 % NA SOLN: 2.0000 | Freq: Three times a day (TID) | NASAL | 5 refills | Status: DC

## 2020-12-08 MED ORDER — DOXYCYCLINE HYCLATE 100 MG PO CAPS: 100.0000 mg | ORAL_CAPSULE | Freq: Two times a day (BID) | ORAL | 0 refills | Status: DC

## 2020-12-08 NOTE — ED Triage Notes (Signed)
Symptoms x 1 week.  pressure in ears, nasal congestion, headache and scratchy throat.  Started to feel worse yesterday.  Cough started yesterday.  Home covid test negative.  Has tried mucinex without relief.

## 2020-12-08 NOTE — ED Provider Notes (Signed)
[facility]    CSN: [id number] Arrival date & time: 12/08/20  0856      History   Chief Complaint No chief complaint on file.   HPI Calandra A Hockley is a 50 y.o. female.   HPI Patient presents with URI symptoms including cough, sore throat, otalgia, nasal congestion, runny nose, and sinus pressure x 1 week.  She took a home COVID test which was negative.  She has tried over-the-counter Mucinex without relief of symptoms.  Cough started today.  She has remained afebrile. Past Medical History:  Diagnosis Date  . Allergy   . Allergy to alpha-gal   . Anxiety   . Colon adenoma DEC 2014   ONE(7 MM) SIMPLE([place])  . Depression   . Edema   . GERD (gastroesophageal reflux disease)   . History of abnormal cervical Pap smear 12/02/2013  . History of nephrolithiasis   . HPV (human papilloma virus) infection   . Hyperlipidemia   . IBS (irritable bowel syndrome)    constipation  . Insomnia   . Obesity 01/14/2014  . Pre-diabetes 09/2014  . Superficial basal cell carcinoma (BCC) 12/26/2019   Right Breast- treatment Imiquimoid  . Yeast infection 09/04/2013    Patient Active Problem List   Diagnosis Date Noted  . Sore throat 09/28/2020  . Streptococcal sore throat 09/28/2020  . Muscle pain 07/12/2020  . Lymphadenopathy 07/12/2020  . Separation of right acromioclavicular joint 02/17/2019  . Concussion with loss of consciousness 02/17/2019  . Idiopathic anaphylaxis 02/12/2019  . Chronic rhinitis 02/12/2019  . Chronic idiopathic urticaria 02/12/2019  . Hx of colonic polyps 07/17/2018  . Gastroesophageal reflux disease 07/17/2018  . Positive test for Epstein-Barr virus (EBV) 08/21/2017  . Anxiety and depression 07/28/2017  . Fatigue 07/12/2015  . Arthralgia 07/12/2015  . AP (abdominal pain) 07/06/2015  . Obesity 01/14/2014  . History of abnormal cervical Pap smear 12/02/2013  . Internal hemorrhoids with other complication 44/31/5400  . Generalized anxiety disorder  12/25/2012  . GERD (gastroesophageal reflux disease) 10/31/2011  . GASTROESOPHAGEAL REFLUX DISEASE 07/13/2008  . HEARTBURN 07/13/2008  . Constipation 05/13/2008  . BLOOD IN STOOL 05/13/2008  . CHANGE IN BOWELS 05/13/2008    Past Surgical History:  Procedure Laterality Date  . BIOPSY N/A 08/01/2013   Procedure: BIOPSY;  Surgeon: Danie Binder, MD;  [facility]: [facility];  Service: Endoscopy;  Laterality: N/A;  FOR MICROSCOPIC COLITIS  . BIOPSY  08/09/2018   Procedure: BIOPSY;  Surgeon: Rogene Houston, MD;  Location: [facility];  Service: Endoscopy;;  gastric  . BREAST REDUCTION SURGERY  2009  . BREAST SURGERY  2007   breast reduction  . CHOLECYSTECTOMY N/A 11/04/2014   Procedure: LAPAROSCOPIC CHOLECYSTECTOMY;  Surgeon: Aviva Signs Md, MD;  Location: [facility];  Service: General;  Laterality: N/A;  . COLONOSCOPY  Oct 2009   Dr. Carlean Purl: int/ext hemorrhoids, ?mild proctitis but path benign, likely r/t irritation from bowel prep  . COLONOSCOPY N/A 08/01/2013   Procedure: COLONOSCOPY;  Surgeon: Danie Binder, MD;  Location: [facility];  Service: Endoscopy;  Laterality: N/A;  12:00  . COLONOSCOPY WITH PROPOFOL N/A 08/09/2018   Procedure: COLONOSCOPY WITH PROPOFOL;  Surgeon: Rogene Houston, MD;  [facility]: [facility];  Service: Endoscopy;  Laterality: N/A;  730  . ESOPHAGOGASTRODUODENOSCOPY  Dec 2009   Dr. Carlean Purl: reflux esophagitis, proximal gastric polyps, benign  . ESOPHAGOGASTRODUODENOSCOPY N/A 12/18/2014   Procedure: ESOPHAGOGASTRODUODENOSCOPY (EGD);  Surgeon: Rogene Houston, MD;  [facility];  Service: Endoscopy;  Laterality: N/A;  230  . ESOPHAGOGASTRODUODENOSCOPY (EGD) WITH PROPOFOL N/A 08/09/2018   Procedure: ESOPHAGOGASTRODUODENOSCOPY (EGD) WITH PROPOFOL;  Surgeon: Rogene Houston, MD;  [facility];  Service: Endoscopy;  Laterality: N/A;  . HEMORRHOID BANDING N/A 08/01/2013   Procedure: HEMORRHOID BANDING;  Surgeon: Danie Binder, MD;   [facility];  Service: Endoscopy;  Laterality: N/A;  internal hemorrhoid banding  . LIVER BIOPSY N/A 11/04/2014   Procedure: LIVER BIOPSY;  Surgeon: Aviva Signs Md, MD;  Location: [facility];  Service: General;  Laterality: N/A;  . POLYPECTOMY  2015   Dr.Fields  . POLYPECTOMY  08/09/2018   Procedure: POLYPECTOMY;  Surgeon: Rogene Houston, MD;  [facility];  Service: Endoscopy;;  colon  . REDUCTION MAMMAPLASTY Bilateral 2006  . UPPER GASTROINTESTINAL ENDOSCOPY  2006   Dr. Nita Sickle in [place]    OB History    Gravida  1   Para  1   Term      Preterm      AB      Living  1     SAB      IAB      Ectopic      Multiple      Live Births  1            Home Medications    Prior to Admission medications   Medication Sig Start Date End Date Taking? Authorizing Provider  albuterol (VENTOLIN HFA) 108 (90 Base) MCG/ACT inhaler Inhale 2 puffs into the lungs every 4 (four) hours as needed for wheezing or shortness of breath. 09/08/20   Lovena Le, Malena M, DO  ALPRAZolam (XANAX) 0.5 MG tablet TAKE 1 TABLET BY MOUTH AT BEDTIME AS NEEDED FOR INSOMNIA. MUST LAST 30 DAYS. 08/26/20 02/22/21  Elvia Collum M, DO  citalopram (CELEXA) 40 MG tablet TAKE 1 TABLET (40 MG TOTAL) BY MOUTH DAILY. 08/26/20 08/26/21  Erven Colla, DO  cycloSPORINE (RESTASIS) 0.05 % ophthalmic emulsion Place 1 drop into both eyes 2 (two) times daily.    [provider]  cycloSPORINE (RESTASIS) 0.05 % ophthalmic emulsion Place 1 drop into both eyes 2 (two) times daily. 11/16/20     dicyclomine (BENTYL) 20 MG tablet Take 20 mg by mouth daily as needed for spasms.    [provider]  EPINEPHrine (EPIPEN 2-PAK) 0.3 mg/0.3 mL IJ SOAJ injection Inject 0.3 mLs (0.3 mg total) into the muscle as needed for anaphylaxis. 09/10/19   Valentina Shaggy, MD  fluticasone [facility]) 50 MCG/ACT nasal spray INSTILL 1 SPRAY INTO BOTH NOSTRILS TWICE DAILY AS NEEDED FOR ALLERGIES OR RHINITIS  02/17/20 02/16/21  Valentina Shaggy, MD  imiquimod (ALDARA) 5 % cream APPLY TOPICALLY TO AFFECTED AREA NIGHTLY ON MONDAY, WEDNESDAY, AND FRIDAY FOR 8 WEEKS. 06/03/20 06/03/21  Lavonna Monarch, MD  ipratropium (ATROVENT) 0.06 % nasal spray Place 2 sprays into both nostrils 3 (three) times daily. 09/12/19   Valentina Shaggy, MD  levocetirizine (XYZAL) 5 MG tablet TAKE 1 TABLET (5 MG TOTAL) BY MOUTH EVERY EVENING. 09/22/20 09/22/21  Valentina Shaggy, MD  levonorgestrel (MIRENA) 20 MCG/24HR IUD 1 each by Intrauterine route once.    [provider]  penicillin v potassium (VEETID) 500 MG tablet Take 1 tablet (500 mg total) by mouth 3 (three) times daily. 09/28/20   Chalmers Guest, NP  prednisoLONE acetate (PRED FORTE) 1 % ophthalmic suspension Place 1 drop into both eyes 2 (two)  times daily as needed.    [provider]  predniSONE (DELTASONE) 10 MG tablet TAKE 2 TABLETS BY MOUTH TWICE DAILY FOR 3 DAYS THEN 1 TABLET TWICE DAILY FOR 3 DAYS 09/29/20   Valentina Shaggy, MD  predniSONE (DELTASONE) 10 MG tablet Take 1 tablet (10 mg total) by mouth daily with breakfast. 09/27/20   Valentina Shaggy, MD  RABEprazole (ACIPHEX) 20 MG tablet TAKE 1 TABLET BY MOUTH DAILY BEFORE BREAKFAST. 09/22/20 09/22/21  Rehman, Mechele Dawley, MD  valACYclovir (VALTREX) 1000 MG tablet Take 1,000 mg by mouth daily. 08/27/19   [provider]  valACYclovir (VALTREX) 1000 MG tablet TAKE 1 TABLET BY MOUTH ONCE DAILY 10/20/20 10/20/21  Louretta Shorten, MD    Family History Family History  Problem Relation Age of Onset  . Cancer Mother        head and neck  . Hypertension Father   . Hyperlipidemia Father   . Pancreatic cancer Maternal Grandfather   . Colon cancer Neg Hx     Social History Social History   Tobacco Use  . Smoking status: Never Smoker  . Smokeless tobacco: Never Used  . Tobacco comment: Never smoker  Vaping Use  . Vaping Use: Never used  Substance Use Topics  . Alcohol use: Yes     Alcohol/week: 0.0 standard drinks    Comment: socially  . Drug use: No     Allergies   Heparin, Ativan [lorazepam], and Phentermine   Review of Systems Review of Systems Pertinent negatives listed in HPI   Physical Exam Triage Vital Signs ED Triage Vitals  Enc Vitals Group     BP 12/08/20 0901 (!) 145/77     Pulse Rate 12/08/20 0901 90     Resp 12/08/20 0901 14     Temp 12/08/20 0901 98 F (36.7 C)     Temp Source 12/08/20 0901 Oral     SpO2 12/08/20 0901 98 %     Weight --      Height --      Head Circumference --      Peak Flow --      Pain Score 12/08/20 0906 1     Pain Loc --      Pain Edu? --      Excl. in [place]? --    No data found.  Updated Vital Signs BP (!) 145/77 (BP Location: Right Arm)   Pulse 90   Temp 98 F (36.7 C) (Oral)   Resp 14   SpO2 98%   Visual Acuity Right Eye Distance:   Left Eye Distance:   Bilateral Distance:    Right Eye Near:   Left Eye Near:    Bilateral Near:     Physical Exam  General Appearance:    Alert, cooperative, no distress  HENT:   Normocephalic, ears normal, nares mucosal edema with congestion, rhinorrhea, oropharynx erythematous without exudate  Eyes:    PERRL, conjunctiva/corneas clear, EOM's intact       Lungs:     Clear to auscultation bilaterally, respirations unlabored  Heart:    Regular rate and rhythm  Neurologic:   Awake, alert, oriented x 3. No apparent focal neurological           defect.    UC Treatments / Results  Labs (all labs ordered are listed, but only abnormal results are displayed) Labs Reviewed - No data to display  EKG   Radiology No results found.  Procedures Procedures (including critical care time)  Medications Ordered in UC Medications - No data to display  Initial Impression / Assessment and Plan / UC Course  I have reviewed the triage vital signs and the nursing notes.  Pertinent labs & imaging results that were available during my care of the patient were reviewed by me  and considered in my medical decision making (see chart for details).     Treating with doxycycline 100 mg twice daily for 10 days.  Solu-Medrol 125 IM given here in clinic for mucosal edema and congestion.  For nasal symptoms Atrovent nasal spray 2 sprays in each nares 3 times daily as needed.  Continue OTC medication for management of cough.  Hydrate well with fluids.  Follow-up with PCP as needed. Final Clinical Impressions(s) / UC Diagnoses   Final diagnoses:  Acute non-recurrent sinusitis, unspecified location   Discharge Instructions   None    ED Prescriptions    Medication Sig Dispense Auth. Provider   ipratropium (ATROVENT) 0.06 % nasal spray Place 2 sprays into both nostrils 3 (three) times daily. 15 mL Scot Jun, FNP   doxycycline (VIBRAMYCIN) 100 MG capsule Take 1 capsule (100 mg total) by mouth 2 (two) times daily. 20 capsule Scot Jun, FNP     PDMP not reviewed this encounter.   Scot Jun, Between 12/13/20 2036

## 2020-12-09 ENCOUNTER — Encounter: Payer: Self-pay | Admitting: Allergy & Immunology

## 2020-12-09 LAB — STREP PNEUMONIAE 23 SEROTYPES IGG
Pneumo Ab Type 1*: >14.2 ug/mL (ref >1.3)
Pneumo Ab Type 12 (12F)*: 0.4 ug/mL — ABNORMAL LOW (ref >1.3)
Pneumo Ab Type 14*: 14.6 ug/mL (ref >1.3)
Pneumo Ab Type 17 (17F)*: >20.2 ug/mL (ref >1.3)
Pneumo Ab Type 19 (19F)*: 2.9 ug/mL (ref >1.3)
Pneumo Ab Type 2*: 15.8 ug/mL (ref >1.3)
Pneumo Ab Type 20*: 5.1 ug/mL (ref >1.3)
Pneumo Ab Type 22 (22F)*: 9.7 ug/mL (ref >1.3)
Pneumo Ab Type 23 (23F)*: 2.3 ug/mL (ref >1.3)
Pneumo Ab Type 26 (6B)*: 5 ug/mL (ref >1.3)
Pneumo Ab Type 3*: 3.7 ug/mL (ref >1.3)
Pneumo Ab Type 34 (10A)*: 5.4 ug/mL (ref >1.3)
Pneumo Ab Type 4*: 1.2 ug/mL — ABNORMAL LOW (ref >1.3)
Pneumo Ab Type 43 (11A)*: 7 ug/mL (ref >1.3)
Pneumo Ab Type 5*: >47.3 ug/mL (ref >1.3)
Pneumo Ab Type 51 (7F)*: 2.5 ug/mL (ref >1.3)
Pneumo Ab Type 54 (15B)*: >22 ug/mL (ref >1.3)
Pneumo Ab Type 56 (18C)*: >8.1 ug/mL (ref >1.3)
Pneumo Ab Type 57 (19A)*: 7.8 ug/mL (ref >1.3)
Pneumo Ab Type 68 (9V)*: 3.5 ug/mL (ref >1.3)
Pneumo Ab Type 70 (33F)*: >10.1 ug/mL (ref >1.3)
Pneumo Ab Type 8*: 10.1 ug/mL (ref >1.3)
Pneumo Ab Type 9 (9N)*: 4.5 ug/mL (ref >1.3)

## 2020-12-14 ENCOUNTER — Encounter: Payer: No Typology Code available for payment source | Admitting: Family Medicine

## 2020-12-14 ENCOUNTER — Other Ambulatory Visit: Payer: Self-pay

## 2020-12-14 MED ORDER — AMOXICILLIN-POT CLAVULANATE 875-125 MG PO TABS: 1.0000 | ORAL_TABLET | Freq: Two times a day (BID) | ORAL | 0 refills | Status: AC

## 2020-12-14 MED ORDER — PREDNISOLONE ACETATE 1 % OP SUSP: 1.0000 [drp] | Freq: Two times a day (BID) | OPHTHALMIC | 0 refills | Status: DC | PRN

## 2020-12-20 ENCOUNTER — Other Ambulatory Visit (HOSPITAL_COMMUNITY): Payer: Self-pay

## 2020-12-20 ENCOUNTER — Other Ambulatory Visit: Payer: Self-pay | Admitting: Family Medicine

## 2020-12-20 DIAGNOSIS — F32A Depression, unspecified: Secondary | ICD-10-CM

## 2020-12-20 MED ORDER — ALPRAZOLAM 0.5 MG PO TABS
0.5000 mg | ORAL_TABLET | Freq: Every day | ORAL | 0 refills | Status: DC | PRN
Start: 2020-12-20 — End: 2021-01-17
  Filled 2020-12-20: qty 15, 15d supply, fill #0

## 2020-12-20 MED ORDER — CITALOPRAM HYDROBROMIDE 40 MG PO TABS
40.0000 mg | ORAL_TABLET | Freq: Every day | ORAL | 0 refills | Status: DC
  Filled 2020-12-20: qty 30, 30d supply, fill #0

## 2020-12-20 MED FILL — Rabeprazole Sodium EC Tab 20 MG: ORAL | 90 days supply | Qty: 90 | Fill #0 | Status: AC

## 2020-12-20 NOTE — Telephone Encounter (Signed)
Sent mychart message

## 2020-12-24 ENCOUNTER — Ambulatory Visit (INDEPENDENT_AMBULATORY_CARE_PROVIDER_SITE_OTHER): Payer: No Typology Code available for payment source

## 2020-12-24 DIAGNOSIS — J309 Allergic rhinitis, unspecified: Secondary | ICD-10-CM

## 2020-12-26 IMAGING — CT CT HEAD WITHOUT CONTRAST
4 series · 17 of 47 positions shown, 19 images · non-contrast
Comparison: None.

CLINICAL DATA: Headache.  Head trauma 1 month ago.  Blurred vision.

EXAM:
CT HEAD WITHOUT CONTRAST
TECHNIQUE: Contiguous axial images were obtained from the base of the skull
through the vertex without intravenous contrast.

[Series 2: head w o · axial · 0.40mm/px · z∈[+1412,+1522]mm · 7 of 30 slices shown, 9 images]
[im 4/30  brain]
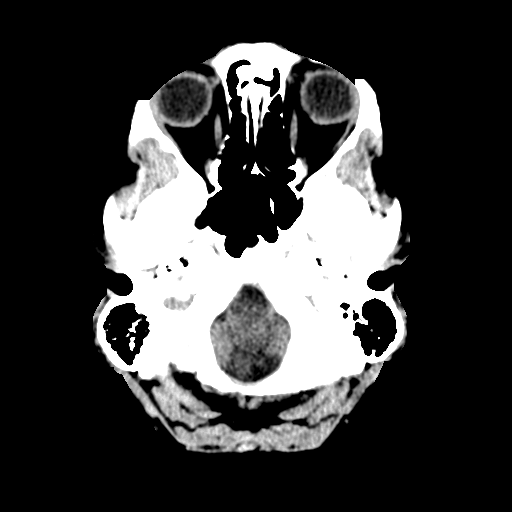
[im 4/30  bone]
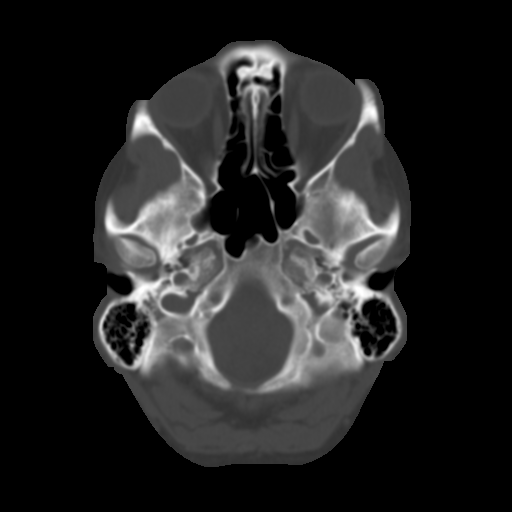
[im 8/30  brain]
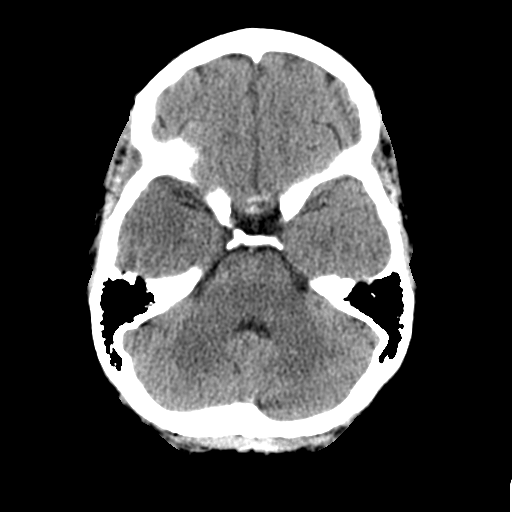
[im 11/30  brain]
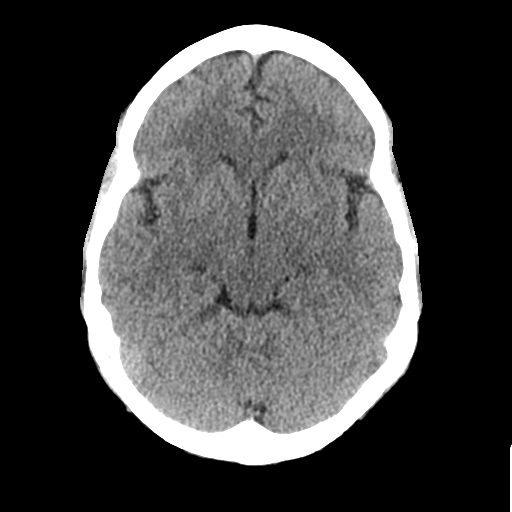
[im 15/30  brain]
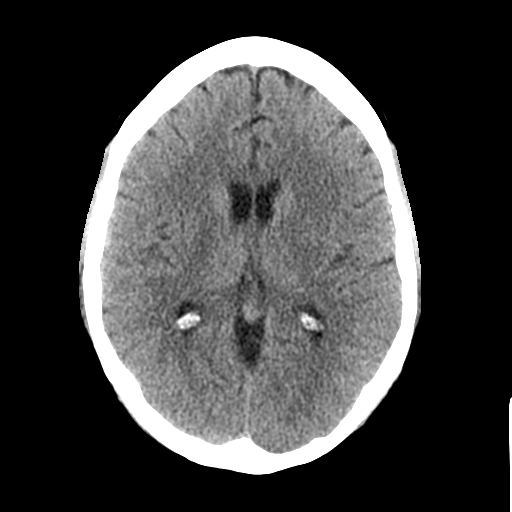
[im 19/30  brain]
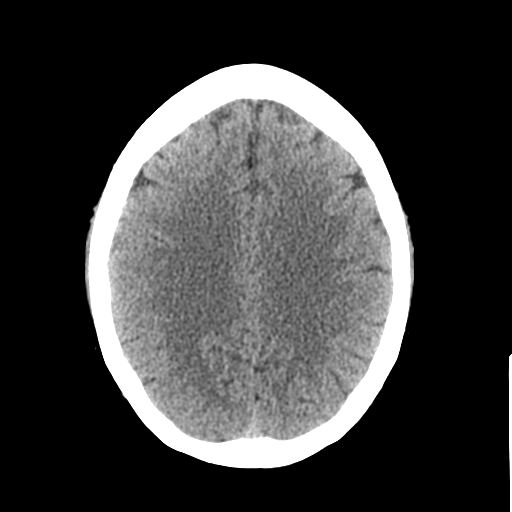
[im 19/30  bone]
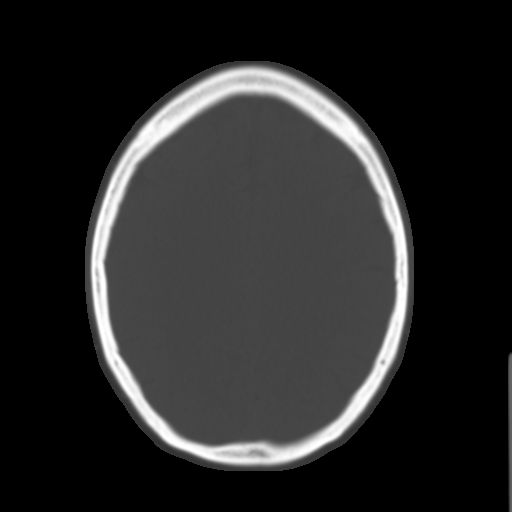
[im 22/30  brain]
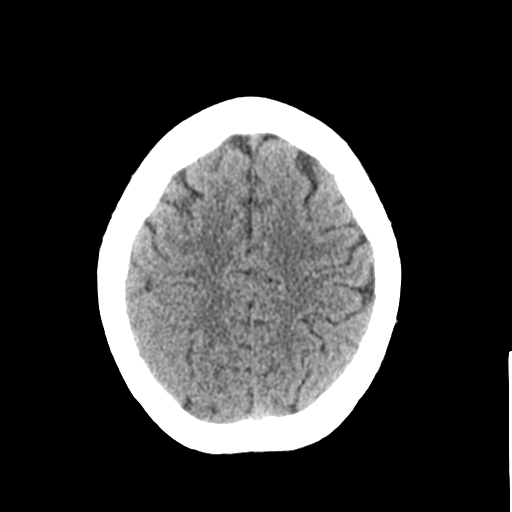
[im 26/30  brain]
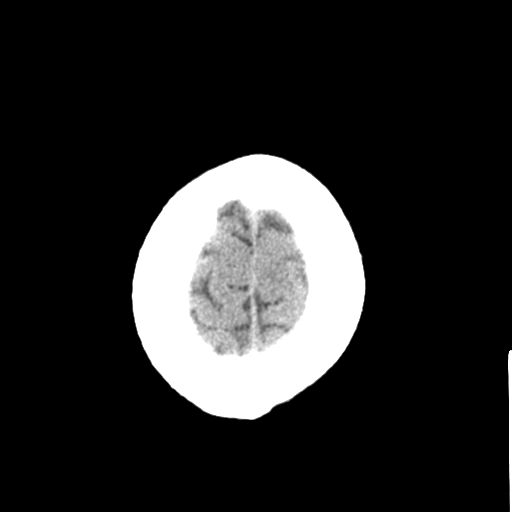

[Series 3: head bone · axial · 0.40mm/px · z∈[+1411,+1463]mm · 4 of 75 slices shown]
[im 8/75  bone]
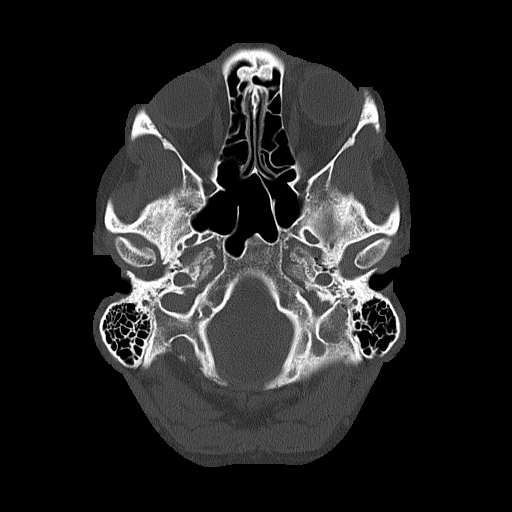
[im 15/75  bone]
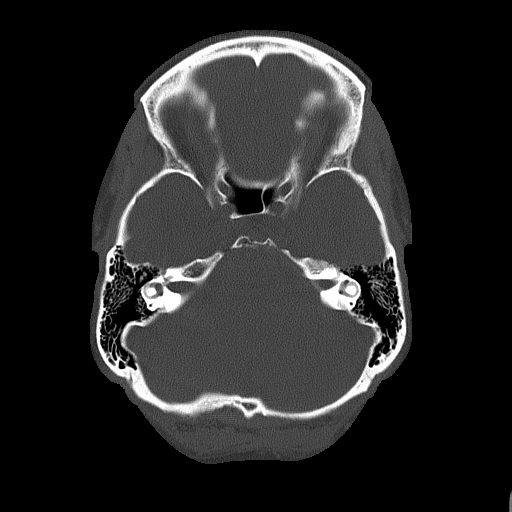
[im 23/75  bone]
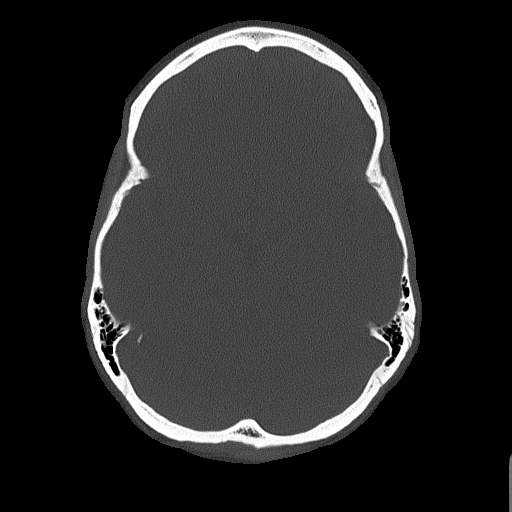
[im 34/75  bone]
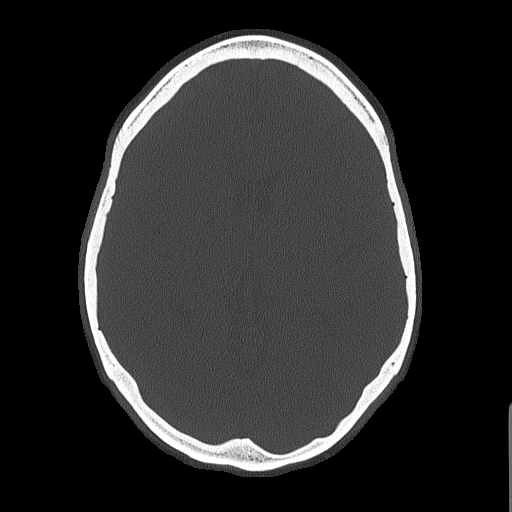

[Series 4: coronal soft · coronal · 0.33mm/px · 3 of 63 slices shown]
[im 21/63  brain]
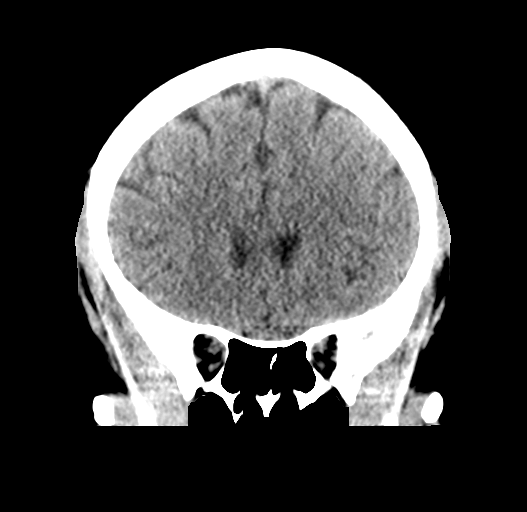
[im 28/63  brain]
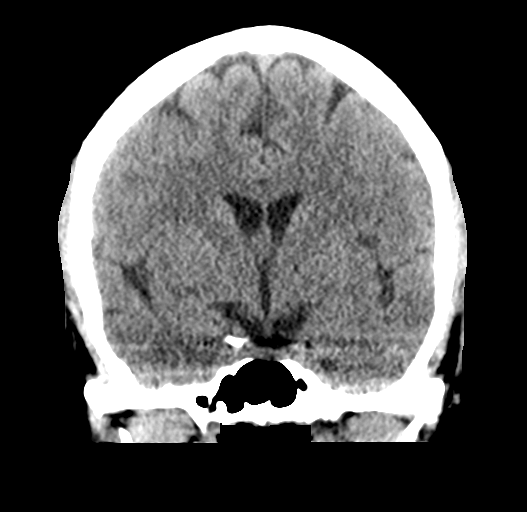
[im 35/63  brain]
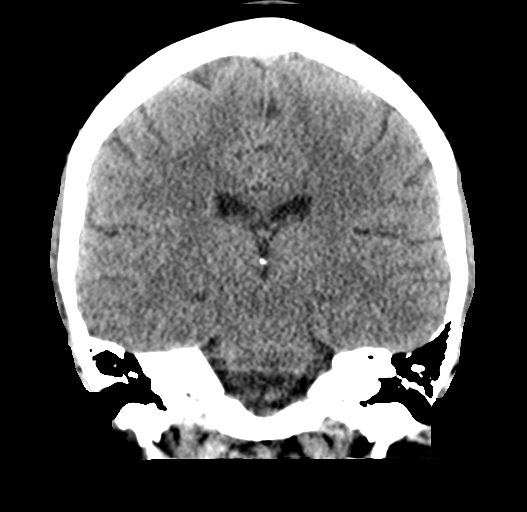

[Series 5: sagittal soft · sagittal · 0.33mm/px · 3 of 58 slices shown]
[im 20/58  brain]
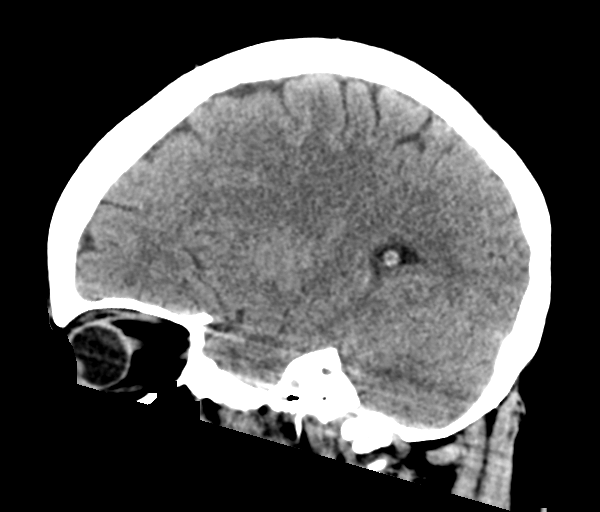
[im 29/58  brain]
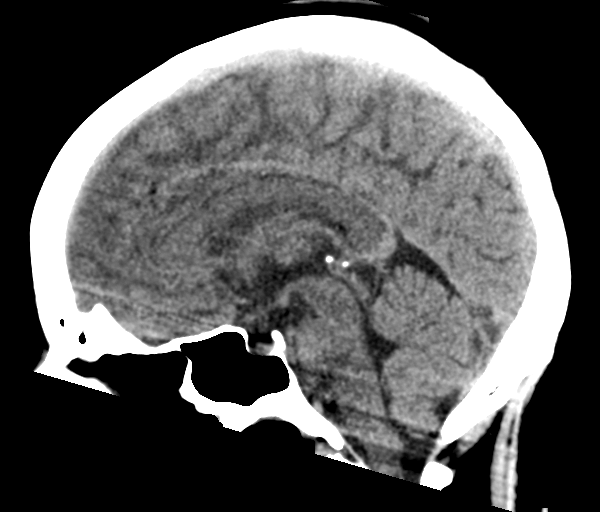
[im 39/58  brain]
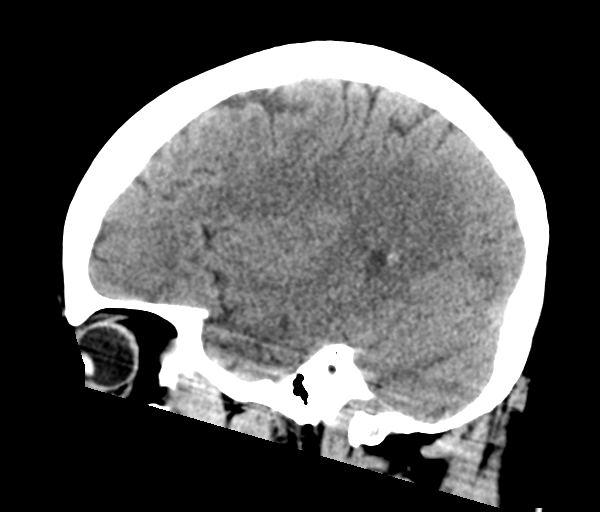

[17 of 47 positions shown; findings below may reference images not displayed]

FINDINGS: Brain: No evidence of acute infarction, hemorrhage, hydrocephalus,
extra-axial collection or mass lesion/mass effect.

Vascular: No hyperdense vessel or unexpected calcification.

Skull: Normal. Negative for fracture or focal lesion.

Sinuses/Orbits: No acute finding.

Other: None.
IMPRESSION: No acute intracranial abnormality.

## 2020-12-31 ENCOUNTER — Ambulatory Visit (INDEPENDENT_AMBULATORY_CARE_PROVIDER_SITE_OTHER): Payer: No Typology Code available for payment source

## 2020-12-31 DIAGNOSIS — J309 Allergic rhinitis, unspecified: Secondary | ICD-10-CM | POA: Diagnosis not present

## 2021-01-14 ENCOUNTER — Ambulatory Visit (INDEPENDENT_AMBULATORY_CARE_PROVIDER_SITE_OTHER): Payer: No Typology Code available for payment source

## 2021-01-14 DIAGNOSIS — J309 Allergic rhinitis, unspecified: Secondary | ICD-10-CM | POA: Diagnosis not present

## 2021-01-15 DIAGNOSIS — E559 Vitamin D deficiency, unspecified: Secondary | ICD-10-CM

## 2021-01-15 DIAGNOSIS — R5383 Other fatigue: Secondary | ICD-10-CM

## 2021-01-17 ENCOUNTER — Telehealth (INDEPENDENT_AMBULATORY_CARE_PROVIDER_SITE_OTHER): Payer: No Typology Code available for payment source | Admitting: Family Medicine

## 2021-01-17 ENCOUNTER — Other Ambulatory Visit (HOSPITAL_COMMUNITY): Payer: Self-pay

## 2021-01-17 ENCOUNTER — Other Ambulatory Visit: Payer: Self-pay

## 2021-01-17 DIAGNOSIS — E559 Vitamin D deficiency, unspecified: Secondary | ICD-10-CM | POA: Diagnosis not present

## 2021-01-17 DIAGNOSIS — F32A Depression, unspecified: Secondary | ICD-10-CM

## 2021-01-17 DIAGNOSIS — F419 Anxiety disorder, unspecified: Secondary | ICD-10-CM

## 2021-01-17 DIAGNOSIS — R5383 Other fatigue: Secondary | ICD-10-CM | POA: Diagnosis not present

## 2021-01-17 MED ORDER — ALPRAZOLAM 0.5 MG PO TABS
0.5000 mg | ORAL_TABLET | Freq: Every day | ORAL | 2 refills | Status: DC | PRN
  Filled 2021-01-17: qty 30, 30d supply, fill #0
  Filled 2021-03-01: qty 30, 30d supply, fill #1
  Filled 2021-04-12: qty 30, 30d supply, fill #2

## 2021-01-17 MED ORDER — CITALOPRAM HYDROBROMIDE 40 MG PO TABS
40.0000 mg | ORAL_TABLET | Freq: Every day | ORAL | 2 refills | Status: DC
Start: 2021-01-17 — End: 2021-04-28
  Filled 2021-01-17: qty 30, 30d supply, fill #0
  Filled 2021-01-24 – 2021-02-21 (×4): qty 30, 30d supply, fill #1
  Filled 2021-03-28: qty 30, 30d supply, fill #2

## 2021-01-17 NOTE — Progress Notes (Signed)
Patient ID: Theresa [name], female    DOB: [birth year], 50 y.o.   MRN: [id number]  Virtual Visit via Telephone Note  I connected with Theresa [name] on 01/17/21 at  1:10 PM EDT by telephone and verified that I am speaking with the correct person using two identifiers.  Location: Patient: home Provider: office   I discussed the limitations, risks, security and privacy concerns of performing an evaluation and management service by telephone and the availability of in person appointments. I also discussed with the patient that there may be a patient responsible charge related to this service. The patient expressed understanding and agreed to proceed.  Chief Complaint  Patient presents with  . Anxiety   Subjective:    HPI  CC- med check up.   Needs refills on xanax and celexa. phq9 and gad 7 done.   Having some fatigue.  Wanting to check the vit D and iron. Pt has been low on this in the past.  Not feeling anxiety during the day. Some times taking 1/2 or 1 tabs.  Using more for sleep, not having anxiety during day. Feels the celexa is helping.  Medical History Theresa [name] has a past medical history of Allergy, Allergy to alpha-gal, Anxiety, Colon adenoma (DEC 2014), Depression, Edema, GERD (gastroesophageal reflux disease), History of abnormal cervical Pap smear (12/02/2013), History of nephrolithiasis, HPV (human papilloma virus) infection, Hyperlipidemia, IBS (irritable bowel syndrome), Insomnia, Obesity (01/14/2014), Pre-diabetes (09/2014), Superficial basal cell carcinoma (BCC) (12/26/2019), and Yeast infection (09/04/2013).   Outpatient Encounter Medications as of 01/17/2021  Medication Sig  . albuterol (VENTOLIN HFA) 108 (90 Base) MCG/ACT inhaler Inhale 2 puffs into the lungs every 4 (four) hours as needed for wheezing or shortness of breath.  . cycloSPORINE (RESTASIS) 0.05 % ophthalmic emulsion Place 1 drop into both eyes 2 (two) times daily.  Marland Kitchen dicyclomine (BENTYL) 20 MG tablet Take 20 mg  by mouth daily as needed for spasms.  Marland Kitchen EPINEPHrine (EPIPEN 2-PAK) 0.3 mg/0.3 mL IJ SOAJ injection Inject 0.3 mLs (0.3 mg total) into the muscle as needed for anaphylaxis.  . fluticasone (FLONASE) 50 MCG/ACT nasal spray INSTILL 1 SPRAY INTO BOTH NOSTRILS TWICE DAILY AS NEEDED FOR ALLERGIES OR RHINITIS  . imiquimod (ALDARA) 5 % cream APPLY TOPICALLY TO AFFECTED AREA NIGHTLY ON MONDAY, WEDNESDAY, AND FRIDAY FOR 8 WEEKS.  . ipratropium (ATROVENT) 0.06 % nasal spray Place 2 sprays into both nostrils 3 (three) times daily.  Marland Kitchen levocetirizine (XYZAL) 5 MG tablet TAKE 1 TABLET (5 MG TOTAL) BY MOUTH EVERY EVENING.  Marland Kitchen levonorgestrel (MIRENA) 20 MCG/24HR IUD 1 each by Intrauterine route once.  . prednisoLONE acetate (PRED FORTE) 1 % ophthalmic suspension Place 1 drop into both eyes 2 (two) times daily as needed.  . RABEprazole (ACIPHEX) 20 MG tablet TAKE 1 TABLET BY MOUTH DAILY BEFORE BREAKFAST.  . valACYclovir (VALTREX) 1000 MG tablet Take 1,000 mg by mouth daily.  . [DISCONTINUED] ALPRAZolam (XANAX) 0.5 MG tablet Take 1 tablet (0.5 mg total) by mouth daily as needed for anxiety. Need appointment for more refills.  . [DISCONTINUED] citalopram (CELEXA) 40 MG tablet Take 1 tablet (40 mg total) by mouth daily.  Marland Kitchen ALPRAZolam (XANAX) 0.5 MG tablet Take 1 tablet (0.5 mg total) by mouth daily as needed for anxiety. Need appointment for more refills.  . citalopram (CELEXA) 40 MG tablet Take 1 tablet (40 mg total) by mouth daily.  . predniSONE (DELTASONE) 10 MG tablet Take 1 tablet (10 mg total) by mouth daily with  breakfast. (Patient not taking: Reported on 01/17/2021)  . [DISCONTINUED] cycloSPORINE (RESTASIS) 0.05 % ophthalmic emulsion Place 1 drop into both eyes 2 (two) times daily.  . [DISCONTINUED] doxycycline (VIBRAMYCIN) 100 MG capsule Take 1 capsule (100 mg total) by mouth 2 (two) times daily.  . [DISCONTINUED] penicillin v potassium (VEETID) 500 MG tablet Take 1 tablet (500 mg total) by mouth 3 (three) times  daily.  . [DISCONTINUED] predniSONE (DELTASONE) 10 MG tablet TAKE 2 TABLETS BY MOUTH TWICE DAILY FOR 3 DAYS THEN 1 TABLET TWICE DAILY FOR 3 DAYS  . [DISCONTINUED] valACYclovir (VALTREX) 1000 MG tablet TAKE 1 TABLET BY MOUTH ONCE DAILY   No facility-administered encounter medications on file as of 01/17/2021.     Review of Systems  Constitutional: Positive for fatigue. Negative for chills and fever.  HENT: Negative for congestion, rhinorrhea and sore throat.   Respiratory: Negative for cough, shortness of breath and wheezing.   Cardiovascular: Negative for chest pain and leg swelling.  Gastrointestinal: Negative for abdominal pain, diarrhea, nausea and vomiting.  Genitourinary: Negative for dysuria and frequency.  Musculoskeletal: Negative for arthralgias and back pain.  Skin: Negative for rash.  Neurological: Negative for dizziness, weakness and headaches.  Psychiatric/Behavioral: Negative for dysphoric mood, self-injury, sleep disturbance and suicidal ideas. The patient is not nervous/anxious.      Vitals There were no vitals taken for this visit.  Objective:   Physical Exam  No PE due to phone visit.  Assessment and Plan   1. Anxiety and depression - ALPRAZolam (XANAX) 0.5 MG tablet; Take 1 tablet (0.5 mg total) by mouth daily as needed for anxiety. Need appointment for more refills.  Dispense: 30 tablet; Refill: 2 - citalopram (CELEXA) 40 MG tablet; Take 1 tablet (40 mg total) by mouth daily.  Dispense: 30 tablet; Refill: 2  2. Vitamin D deficiency - Vitamin D (25 hydroxy)  3. Fatigue, unspecified type - Iron Binding Cap (TIBC)([facility]) - Ferritin - Iron   Pt requesting labs for fatigue, h/o vit d deficiency. And iron studies.  Anxiety- stable. Cont meds. Using xanax 1/2-1 tab prn at night for insomnia.   Cont with celexa.  Return in about 3 months (around 04/19/2021) for f/u anxiety/insomnia.  01/17/2021    Follow Up Instructions:    I discussed the  assessment and treatment plan with the patient. The patient was provided an opportunity to ask questions and all were answered. The patient agreed with the plan and demonstrated an understanding of the instructions.   The patient was advised to call back or seek an in-person evaluation if the symptoms worsen or if the condition fails to improve as anticipated.  I provided 15 minutes of non-face-to-face time during this encounter.

## 2021-01-17 NOTE — Telephone Encounter (Signed)
Pt had phone visit with provider and provider ordered labs.

## 2021-01-18 ENCOUNTER — Other Ambulatory Visit (HOSPITAL_COMMUNITY): Payer: Self-pay

## 2021-01-18 MED ORDER — ALPRAZOLAM 0.25 MG PO TABS
0.2500 mg | ORAL_TABLET | Freq: Every evening | ORAL | 0 refills | Status: DC | PRN
  Filled 2021-01-18: qty 30, 30d supply, fill #0

## 2021-01-18 MED ORDER — PODOFILOX 0.5 % EX SOLN
CUTANEOUS | 2 refills | Status: DC
  Filled 2021-01-18: qty 3.5, 30d supply, fill #0

## 2021-01-18 MED ORDER — VALACYCLOVIR HCL 1 G PO TABS
1000.0000 mg | ORAL_TABLET | Freq: Every day | ORAL | 4 refills | Status: DC
  Filled 2021-01-18: qty 90, 90d supply, fill #0

## 2021-01-19 ENCOUNTER — Other Ambulatory Visit (HOSPITAL_COMMUNITY): Payer: Self-pay

## 2021-01-19 ENCOUNTER — Ambulatory Visit (INDEPENDENT_AMBULATORY_CARE_PROVIDER_SITE_OTHER): Payer: No Typology Code available for payment source

## 2021-01-19 DIAGNOSIS — J309 Allergic rhinitis, unspecified: Secondary | ICD-10-CM

## 2021-01-24 ENCOUNTER — Telehealth (INDEPENDENT_AMBULATORY_CARE_PROVIDER_SITE_OTHER): Payer: Self-pay | Admitting: Allergy & Immunology

## 2021-01-24 ENCOUNTER — Other Ambulatory Visit (HOSPITAL_COMMUNITY): Payer: Self-pay

## 2021-01-24 MED ORDER — LEVOCETIRIZINE DIHYDROCHLORIDE 5 MG PO TABS
5.0000 mg | ORAL_TABLET | Freq: Every evening | ORAL | 0 refills | Status: DC
  Filled 2021-01-24: qty 30, 30d supply, fill #0

## 2021-01-24 NOTE — Telephone Encounter (Signed)
Patient scheduled an appointment with Dr. Ernst Bowler, 04/01/21 in [place].

## 2021-01-24 NOTE — Telephone Encounter (Signed)
Last seen for an office visit January of 2021 and was due back for an office visit 03/09/2020.

## 2021-01-24 NOTE — Telephone Encounter (Signed)
Patient's appointment is on 04/01/21 I sent in a 30 day courtesy refill would you like to add more refills to last her until August? Please advise.

## 2021-01-25 ENCOUNTER — Other Ambulatory Visit (HOSPITAL_COMMUNITY): Payer: Self-pay

## 2021-01-25 MED ORDER — FLUCONAZOLE 150 MG PO TABS
150.0000 mg | ORAL_TABLET | Freq: Every day | ORAL | 0 refills | Status: DC
  Filled 2021-01-25: qty 2, 3d supply, fill #0

## 2021-01-25 MED ORDER — LEVOCETIRIZINE DIHYDROCHLORIDE 5 MG PO TABS: 5.0000 mg | ORAL_TABLET | Freq: Every evening | ORAL | 1 refills | Status: DC

## 2021-01-25 NOTE — Telephone Encounter (Signed)
Sent in enough refills to last until aug appt

## 2021-01-25 NOTE — Addendum Note (Signed)
Addended by: Felipa Emory on: 01/25/2021 09:02 AM   Modules accepted: Orders

## 2021-01-25 NOTE — Telephone Encounter (Signed)
Yes, please give her enough refills to last until then.  Salvatore Marvel, MD [facility] [place]

## 2021-01-26 ENCOUNTER — Other Ambulatory Visit (HOSPITAL_COMMUNITY): Payer: Self-pay

## 2021-01-27 ENCOUNTER — Other Ambulatory Visit (HOSPITAL_COMMUNITY): Payer: Self-pay

## 2021-01-27 ENCOUNTER — Telehealth: Payer: No Typology Code available for payment source

## 2021-01-27 NOTE — Telephone Encounter (Signed)
Dr. Ernst Bowler,  We received a fax from [facility] requesting a refill for AMOX-CLAV 875-125MG  tablets, is this something you would like to refill?  [facility] [phone] phone                   [phone] fax

## 2021-01-27 NOTE — Telephone Encounter (Signed)
Thank you for checking - no need to refill that. She can call if she is having issues.   Salvatore Marvel, MD [facility] [place]

## 2021-02-16 ENCOUNTER — Ambulatory Visit (INDEPENDENT_AMBULATORY_CARE_PROVIDER_SITE_OTHER): Payer: No Typology Code available for payment source

## 2021-02-16 DIAGNOSIS — J309 Allergic rhinitis, unspecified: Secondary | ICD-10-CM

## 2021-02-21 ENCOUNTER — Other Ambulatory Visit (INDEPENDENT_AMBULATORY_CARE_PROVIDER_SITE_OTHER): Payer: Self-pay | Admitting: Internal Medicine

## 2021-02-21 ENCOUNTER — Other Ambulatory Visit (HOSPITAL_COMMUNITY): Payer: Self-pay

## 2021-02-21 DIAGNOSIS — K219 Gastro-esophageal reflux disease without esophagitis: Secondary | ICD-10-CM

## 2021-02-21 MED ORDER — RABEPRAZOLE SODIUM 20 MG PO TBEC
20.0000 mg | DELAYED_RELEASE_TABLET | Freq: Every day | ORAL | 0 refills | Status: DC
  Filled 2021-02-21: qty 90, fill #0

## 2021-02-22 ENCOUNTER — Other Ambulatory Visit (HOSPITAL_COMMUNITY): Payer: Self-pay

## 2021-02-25 ENCOUNTER — Ambulatory Visit (INDEPENDENT_AMBULATORY_CARE_PROVIDER_SITE_OTHER): Payer: No Typology Code available for payment source

## 2021-02-25 ENCOUNTER — Other Ambulatory Visit (HOSPITAL_COMMUNITY): Payer: Self-pay

## 2021-02-25 DIAGNOSIS — J309 Allergic rhinitis, unspecified: Secondary | ICD-10-CM

## 2021-02-25 MED ORDER — LEVOCETIRIZINE DIHYDROCHLORIDE 5 MG PO TABS
5.0000 mg | ORAL_TABLET | Freq: Every evening | ORAL | 1 refills | Status: DC
  Filled 2021-02-25: qty 30, 30d supply, fill #0

## 2021-03-01 ENCOUNTER — Other Ambulatory Visit (HOSPITAL_COMMUNITY): Payer: Self-pay

## 2021-03-03 ENCOUNTER — Other Ambulatory Visit (HOSPITAL_COMMUNITY): Payer: Self-pay

## 2021-03-04 ENCOUNTER — Ambulatory Visit (INDEPENDENT_AMBULATORY_CARE_PROVIDER_SITE_OTHER): Payer: No Typology Code available for payment source

## 2021-03-04 DIAGNOSIS — J309 Allergic rhinitis, unspecified: Secondary | ICD-10-CM | POA: Diagnosis not present

## 2021-03-13 ENCOUNTER — Encounter: Payer: Self-pay | Admitting: Dermatology

## 2021-03-13 NOTE — Progress Notes (Signed)
    Follow-Up Visit   Subjective  Theresa [name] is a 50 y.o. female who presents for the following: Procedure (Patient here today for treatment of BCC x 1 on Right Breast.  ).  BCC right chest plus several areas to check. Location:  Duration:  Quality:  Associated Signs/Symptoms: Modifying Factors:  Severity:  Timing: Context:   Objective  Well appearing patient in no apparent distress; mood and affect are within normal limits. Right Breast Clear on exam today, no signs of residual.  Chest - Medial (Center), Right Inframammary Fold Flat flesh colored textured 2 to 4 mm spots.  Left Abdomen (side) - Upper Flesh colored millimeter pedunculated papule    All skin waist up examined.   Assessment & Plan    Basal cell carcinoma (BCC) of skin of right breast Right Breast  imiquimod (ALDARA) 5 % cream Apply to affected area nightly M, W, F x 8 weeks  No surgery needed today.  Will treat with topical therapy instead. Return in 4 months for follow up.  Seborrheic keratosis (2) Chest - Medial [facility]); Right Inframammary Fold  Okay to try freeze with Ln2 (Cryo) (patient requested).  Destruction of lesion - Chest - Medial (Center), Right Inframammary Fold Complexity: simple   Destruction method: cryotherapy   Informed consent: discussed and consent obtained   Timeout:  patient name, date of birth, surgical site, and procedure verified Lesion destroyed using liquid nitrogen: Yes   Cryotherapy cycles:  3 Outcome: patient tolerated procedure well with no complications   Post-procedure details: wound care instructions given    Skin tag Left Abdomen (side) - Upper  Snip tag. No waiver per Dr. Stephania Fragmin, Lavonna Monarch, MD, have reviewed all documentation for this visit.  The documentation on 03/13/21 for the exam, diagnosis, procedures, and orders are all accurate and complete.

## 2021-03-18 ENCOUNTER — Ambulatory Visit (INDEPENDENT_AMBULATORY_CARE_PROVIDER_SITE_OTHER): Payer: No Typology Code available for payment source

## 2021-03-18 DIAGNOSIS — J309 Allergic rhinitis, unspecified: Secondary | ICD-10-CM | POA: Diagnosis not present

## 2021-03-25 ENCOUNTER — Ambulatory Visit (INDEPENDENT_AMBULATORY_CARE_PROVIDER_SITE_OTHER): Payer: No Typology Code available for payment source

## 2021-03-25 DIAGNOSIS — J309 Allergic rhinitis, unspecified: Secondary | ICD-10-CM

## 2021-03-27 ENCOUNTER — Other Ambulatory Visit (INDEPENDENT_AMBULATORY_CARE_PROVIDER_SITE_OTHER): Payer: Self-pay | Admitting: Internal Medicine

## 2021-03-27 DIAGNOSIS — K219 Gastro-esophageal reflux disease without esophagitis: Secondary | ICD-10-CM

## 2021-03-27 MED ORDER — LANSOPRAZOLE 30 MG PO CPDR
30.0000 mg | DELAYED_RELEASE_CAPSULE | Freq: Every day | ORAL | 3 refills | Status: DC
  Filled 2021-03-27: qty 90, 90d supply, fill #0
  Filled 2021-06-24: qty 90, 90d supply, fill #1
  Filled 2021-10-05: qty 90, 90d supply, fill #2
  Filled 2022-01-11: qty 90, 90d supply, fill #3

## 2021-03-27 NOTE — Telephone Encounter (Signed)
Patient states Rebaprazole is not working anymore. We will switch patient to lansoprazole 30 mg p.o. every morning. Prescription sent to: Pharmacy for 90 days with 3 refills.

## 2021-03-28 ENCOUNTER — Other Ambulatory Visit (INDEPENDENT_AMBULATORY_CARE_PROVIDER_SITE_OTHER): Payer: Self-pay | Admitting: Allergy & Immunology

## 2021-03-28 ENCOUNTER — Other Ambulatory Visit (HOSPITAL_COMMUNITY): Payer: Self-pay

## 2021-03-28 MED ORDER — FLUTICASONE PROPIONATE 50 MCG/ACT NA SUSP
1.0000 | Freq: Two times a day (BID) | NASAL | 0 refills | Status: DC | PRN
  Filled 2021-03-28: qty 16, 30d supply, fill #0

## 2021-03-29 ENCOUNTER — Other Ambulatory Visit (HOSPITAL_COMMUNITY): Payer: Self-pay

## 2021-03-29 MED ORDER — ALPRAZOLAM 0.25 MG PO TABS
0.2500 mg | ORAL_TABLET | Freq: Every evening | ORAL | 0 refills | Status: DC | PRN
  Filled 2021-03-29: qty 30, 30d supply, fill #0

## 2021-04-01 ENCOUNTER — Other Ambulatory Visit: Payer: Self-pay

## 2021-04-01 ENCOUNTER — Encounter: Payer: Self-pay | Admitting: Allergy & Immunology

## 2021-04-01 ENCOUNTER — Ambulatory Visit: Payer: No Typology Code available for payment source | Admitting: Allergy & Immunology

## 2021-04-01 ENCOUNTER — Telehealth: Payer: Self-pay

## 2021-04-01 VITALS — BP 140/80 | HR 97 | Temp 97.3°F | Resp 18 | Ht 66.0 in | Wt 210.6 lb

## 2021-04-01 DIAGNOSIS — J309 Allergic rhinitis, unspecified: Secondary | ICD-10-CM

## 2021-04-01 DIAGNOSIS — B999 Unspecified infectious disease: Secondary | ICD-10-CM | POA: Diagnosis not present

## 2021-04-01 DIAGNOSIS — J302 Other seasonal allergic rhinitis: Secondary | ICD-10-CM

## 2021-04-01 DIAGNOSIS — J4520 Mild intermittent asthma, uncomplicated: Secondary | ICD-10-CM | POA: Diagnosis not present

## 2021-04-01 DIAGNOSIS — T782XXD Anaphylactic shock, unspecified, subsequent encounter: Secondary | ICD-10-CM

## 2021-04-01 NOTE — Telephone Encounter (Signed)
Patient was seen in clinic today, she informed Dr. Ernst Bowler that she would like to get her allergy injections done at her job. I have sent the acknowledgment forms with patient to have them filled out and signed. Patient will either bring them back next week when she gets her injections or she will fax them. Guidelines and record sheets have been filled out and placed on the desk in the injection room.

## 2021-04-01 NOTE — Patient Instructions (Addendum)
1. Idiopathic anaphylaxis - with a positive alpha gal  - EpiPen is up to date.   2. Seasonal and perennial allergic rhinitis (grasses, weeds, ragweed, trees, molds, cat, dog) - Continue with the allergy shots. - I am fine with you getting them at your work place. - I want you to get them at the hospital in case you have a reaction. - I emailed Jene Every to see if she would be willing to give you shots at work.  - Continue with Flonase one spray per nostril as needed (can increase to twice daily on bad days). - Continue with nasal ipratropium one spray per nostril every 8 hours as needed (for RUNNY NOSES).  - Continue with Xyzal '5mg'$  daily (can increase to twice daily on bad days).   3. Return in about 1 year (around 04/01/2022).    Please inform us of any Emergency Department visits, hospitalizations, or changes in symptoms. Call us before going to the ED for breathing or allergy symptoms since we might be able to fit you in for a sick visit. Feel free to contact us anytime with any questions, problems, or concerns.  It was a pleasure to see you again today! Thank you for taking such excellent care of our mutual patients!   Websites that have reliable patient information: 1. American Academy of Asthma, Allergy, and Immunology: www.aaaai.org 2. Food Allergy Research and Education (FARE): foodallergy.org 3. Mothers of Asthmatics: http://www.asthmacommunitynetwork.org 4. American College of Allergy, Asthma, and Immunology: www.acaai.org   COVID-19 Vaccine Information can be found at: ShippingScam.co.uk For questions related to vaccine distribution or appointments, please email [email or web address] or call [phone].     "Like" [place] on Facebook and Instagram for our latest updates!        Make sure you are registered to vote! If you have moved or changed any of your contact information, you will need to get this updated  before voting!  In some cases, you MAY be able to register to vote online: [email or web address]    [place]: [phone]

## 2021-04-01 NOTE — Progress Notes (Signed)
FOLLOW UP  Date of Service/Encounter:  04/01/21   Assessment:   Idiopathic anaphylaxis - with episodes of flushing/hives    Chronic rhinitis (cat, dog, grasses, molds, trees, ragweed, and weeds) - on allergen immunotherapy   Anaphylactic shock due to food - with a positive alpha gal panel and continued low intensity reactions (not great at avoiding exposures)   S/p chiropractor visit to treat alpha gal [facility])    Plan/Recommendations:   1. Idiopathic anaphylaxis - with a positive alpha gal  - EpiPen is up to date. - IgE was never that elevated, although her reactions were rather severe. - I doubt that the accupuncture had anything to do with this resolution, but she is eating meast   2. Seasonal and perennial allergic rhinitis (grasses, weeds, ragweed, trees, molds, cat, dog) - Continue with the allergy shots. - I am fine with you getting them at your work place. - I want you to get them at the hospital in case you have a reaction. - I emailed Jene Every to see if she would be willing to give you shots at work.  - Continue with Flonase one spray per nostril as needed (can increase to twice daily on bad days). - Continue with nasal ipratropium one spray per nostril every 8 hours as needed (for RUNNY NOSES).  - Continue with Xyzal '5mg'$  daily (can increase to twice daily on bad days).   3. Return in about 1 year (around 04/01/2022).    Subjective:   Theresa [name] is a 50 y.o. female presenting today for follow up of  Chief Complaint  Patient presents with   Allergic Rhinitis     Theresa [name] has a history of the following: Patient Active Problem List   Diagnosis Date Noted   Sore throat 09/28/2020   Streptococcal sore throat 09/28/2020   Muscle pain 07/12/2020   Lymphadenopathy 07/12/2020   Separation of right acromioclavicular joint 02/17/2019   Concussion with loss of consciousness 02/17/2019   Idiopathic anaphylaxis 02/12/2019   Chronic rhinitis  02/12/2019   Chronic idiopathic urticaria 02/12/2019   Hx of colonic polyps 07/17/2018   Gastroesophageal reflux disease 07/17/2018   Positive test for Epstein-Barr virus (EBV) 08/21/2017   Anxiety and depression 07/28/2017   Fatigue 07/12/2015   Arthralgia 07/12/2015   AP (abdominal pain) 07/06/2015   Obesity 01/14/2014   History of abnormal cervical Pap smear 12/02/2013   Internal hemorrhoids with other complication 0000000   Generalized anxiety disorder 12/25/2012   GERD (gastroesophageal reflux disease) 10/31/2011   GASTROESOPHAGEAL REFLUX DISEASE 07/13/2008   HEARTBURN 07/13/2008   Constipation 05/13/2008   BLOOD IN STOOL 05/13/2008   CHANGE IN BOWELS 05/13/2008    History obtained from: chart review and patient.  Theresa [name] is a 50 y.o. female presenting for a follow up visit.  She was last seen in September 2021.  At that time, she underwent testing to the COVID-vaccine components and was completely negative.  She got her vaccine right after the testing.  She was last seen for regular office visit in January 2021.  At that time, we made sure her EpiPen was up-to-date.  For her allergic rhinitis, she decided to start allergen immunotherapy.  We gave her a prednisone Dosepak and increase her Flonase to twice daily.  We also added on nasal ipratropium 1 spray per nostril every 8 hours.  We continue with Xyzal 5 mg daily.  We did do an immune work-up in February 2022.  She had normal  complement activity. Immunoglobulin levels were all normal. She was protective to Diphtheria as well as Tetanus. She was not adequately protected to Strep pneumonia, but she responded well to the vaccine.   Since last visit, she has mostly done well.   Asthma/Respiratory Symptom History: She uses her albuterol once every six months.  This was prescribed when she had COVID. Theresa [name]'s asthma has been well controlled. She has not required rescue medication, experienced nocturnal awakenings due to lower respiratory  symptoms, nor have activities of daily living been limited. She has required no Emergency Department or Urgent Care visits for her asthma. She has required zero courses of systemic steroids for asthma exacerbations since the last visit. ACT score today is 25, indicating excellent asthma symptom control.    Allergic Rhinitis Symptom History: Her allergy shots are definitely working. She had some issues early on in the process but she has been doing very well with the build up for several months at this point. She is thinking Jene Every, who is her supervisor, might be willing to give the allergy shots. 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 We have discussed doing this in the past, but she was having so many reactions that we decided to keep her in the clinic instead.   Review of her MyChart messages show that we sent in Augmentin in April 2022.  She also completed a course of doxycycline in April 2022 as well. She has not required the use of antibiotics since that time. She feels that the Pneumovax has helped to stop any breakthrough sinus infections or infections in general.   Food Allergy Symptom History: She is eating beef without any problems. She tolerated this without a problem. EpiPen is up to date.   She continues to have issues with fatigue. She has seen Rheumatology without improvement. She saw Infectious Disease at some point. All of this is from 2016. She was unable to keep her eyes open. She was diagnosed with depression. She now has energy until around 11am. She did have some testing for Epstein-Barr and had high titers. She has tried decreasing her Xanax to 0.25 mg from '3mg'$ .  Otherwise, there have been no changes to her past medical history, surgical history, family history, or social history.    Review of Systems  Constitutional:  Positive for malaise/fatigue. Negative for chills, fever and weight loss.  HENT:  Positive for congestion. Negative for ear discharge and  ear pain.   Eyes:  Negative for pain, discharge and redness.  Respiratory:  Negative for cough, sputum production, shortness of breath and wheezing.   Cardiovascular: Negative.  Negative for chest pain and palpitations.  Gastrointestinal:  Negative for abdominal pain, constipation, diarrhea, heartburn, nausea and vomiting.  Skin: Negative.  Negative for itching and rash.  Neurological:  Negative for dizziness and headaches.  Endo/Heme/Allergies:  Negative for environmental allergies. Does not bruise/bleed easily.      Objective:   Blood pressure 140/80, pulse 97, temperature (!) 97.3 F (36.3 C), temperature source Temporal, resp. rate 18, height '5\' 6"'$  (1.676 m), weight 210 lb 9.6 oz (95.5 kg), SpO2 97 %. Body mass index is 33.99 kg/m.   Physical Exam:  Physical Exam Vitals reviewed.  Constitutional:      Appearance: She is well-developed.  HENT:     Head: Normocephalic and atraumatic.     Right Ear: Tympanic membrane, ear canal and external ear normal.     Left Ear: Tympanic membrane, ear canal and external  ear normal.     Nose: No nasal deformity, septal deviation, mucosal edema or rhinorrhea.     Right Turbinates: Enlarged and swollen.     Left Turbinates: Enlarged and swollen.     Right Sinus: No maxillary sinus tenderness or frontal sinus tenderness.     Left Sinus: No maxillary sinus tenderness or frontal sinus tenderness.     Mouth/Throat:     Mouth: Mucous membranes are not pale and not dry.     Pharynx: Uvula midline.  Eyes:     General: Lids are normal. No allergic shiner.       Right eye: No discharge.        Left eye: No discharge.     Conjunctiva/sclera: Conjunctivae normal.     Right eye: Right conjunctiva is not injected. No chemosis.    Left eye: Left conjunctiva is not injected. No chemosis.    Pupils: Pupils are equal, round, and reactive to light.  Cardiovascular:     Rate and Rhythm: Normal rate and regular rhythm.     Heart sounds: Normal heart  sounds.  Pulmonary:     Effort: Pulmonary effort is normal. No tachypnea, accessory muscle usage or respiratory distress.     Breath sounds: Normal breath sounds. No wheezing, rhonchi or rales.     Comments: No wheezing or crackles noted.  Chest:     Chest wall: No tenderness.  Lymphadenopathy:     Cervical: No cervical adenopathy.  Skin:    General: Skin is warm.     Capillary Refill: Capillary refill takes less than 2 seconds.     Coloration: Skin is not pale.     Findings: No abrasion, erythema, petechiae or rash. Rash is not papular, urticarial or vesicular.     Comments: No eczematous or urticarial lesions noted.   Neurological:     Mental Status: She is alert.  Psychiatric:        Behavior: Behavior is cooperative.     Diagnostic studies:   Spirometry: results normal (FEV1: 2.70/91%, FVC: 3.63/97%, FEV1/FVC: 74%).    Spirometry consistent with normal pattern.   Allergy Studies: none        Salvatore Marvel, MD  [facility] [facility] [place]

## 2021-04-03 ENCOUNTER — Encounter: Payer: Self-pay | Admitting: Allergy & Immunology

## 2021-04-03 MED ORDER — IPRATROPIUM BROMIDE 0.06 % NA SOLN
2.0000 | Freq: Three times a day (TID) | NASAL | 11 refills | Status: DC
  Filled 2021-04-03: qty 15, 25d supply, fill #0
  Filled 2021-04-11: qty 15, 14d supply, fill #0
  Filled 2021-04-11: qty 15, 25d supply, fill #1
  Filled 2021-09-06: qty 15, 14d supply, fill #1
  Filled 2022-02-27: qty 15, 14d supply, fill #2

## 2021-04-03 MED ORDER — FLUTICASONE PROPIONATE 50 MCG/ACT NA SUSP
1.0000 | Freq: Two times a day (BID) | NASAL | 11 refills | Status: DC | PRN
  Filled 2021-04-03 – 2021-09-06 (×2): qty 16, 30d supply, fill #0
  Filled 2021-11-22: qty 16, 30d supply, fill #1
  Filled 2022-03-08: qty 16, 30d supply, fill #2

## 2021-04-03 MED ORDER — LEVOCETIRIZINE DIHYDROCHLORIDE 5 MG PO TABS
5.0000 mg | ORAL_TABLET | Freq: Every evening | ORAL | 11 refills | Status: DC
  Filled 2021-04-03 – 2021-04-11 (×2): qty 90, 90d supply, fill #0
  Filled 2021-04-11 – 2021-07-14 (×2): qty 90, 90d supply, fill #1
  Filled 2021-10-17: qty 90, 90d supply, fill #2

## 2021-04-04 ENCOUNTER — Other Ambulatory Visit (HOSPITAL_BASED_OUTPATIENT_CLINIC_OR_DEPARTMENT_OTHER): Payer: Self-pay

## 2021-04-07 NOTE — Telephone Encounter (Signed)
Patient emailed me today 04/07/2021 and informed me that her daughter threw the forms away when cleaning and asked if I could fax them to [phone]. Forms were faxed and I informed patient to let me know if she didn't receive them. I have not heard anything back so hopefully she received them and will bring them back tomorrow 04/08/2021. If forms are signed and brought back we can send patient with her vials when she comes in tomorrow 04/08/2021 to get her allergy injection.

## 2021-04-11 ENCOUNTER — Other Ambulatory Visit (HOSPITAL_COMMUNITY): Payer: Self-pay

## 2021-04-11 ENCOUNTER — Other Ambulatory Visit (HOSPITAL_BASED_OUTPATIENT_CLINIC_OR_DEPARTMENT_OTHER): Payer: Self-pay

## 2021-04-12 ENCOUNTER — Other Ambulatory Visit: Payer: Self-pay | Admitting: Nurse Practitioner

## 2021-04-12 ENCOUNTER — Other Ambulatory Visit: Payer: Self-pay | Admitting: Family Medicine

## 2021-04-12 ENCOUNTER — Other Ambulatory Visit (HOSPITAL_COMMUNITY): Payer: Self-pay

## 2021-04-12 DIAGNOSIS — F419 Anxiety disorder, unspecified: Secondary | ICD-10-CM

## 2021-04-12 DIAGNOSIS — F32A Depression, unspecified: Secondary | ICD-10-CM

## 2021-04-12 NOTE — Telephone Encounter (Signed)
Patient did not come into the office to get her injections on 04/08/21.

## 2021-04-15 NOTE — Telephone Encounter (Signed)
I called the patient she plans to keep getting her injections at our office she stated her boss did not feel comfortable with the injections.

## 2021-04-22 ENCOUNTER — Ambulatory Visit (INDEPENDENT_AMBULATORY_CARE_PROVIDER_SITE_OTHER): Payer: No Typology Code available for payment source

## 2021-04-22 DIAGNOSIS — J309 Allergic rhinitis, unspecified: Secondary | ICD-10-CM

## 2021-04-28 ENCOUNTER — Other Ambulatory Visit (HOSPITAL_COMMUNITY): Payer: Self-pay

## 2021-04-28 ENCOUNTER — Other Ambulatory Visit: Payer: Self-pay | Admitting: Family Medicine

## 2021-04-28 DIAGNOSIS — F32A Depression, unspecified: Secondary | ICD-10-CM

## 2021-04-28 MED ORDER — CITALOPRAM HYDROBROMIDE 40 MG PO TABS
40.0000 mg | ORAL_TABLET | Freq: Every day | ORAL | 0 refills | Status: DC
  Filled 2021-04-28: qty 30, 30d supply, fill #0

## 2021-04-29 ENCOUNTER — Ambulatory Visit (INDEPENDENT_AMBULATORY_CARE_PROVIDER_SITE_OTHER): Payer: No Typology Code available for payment source | Admitting: *Deleted

## 2021-04-29 ENCOUNTER — Other Ambulatory Visit (HOSPITAL_BASED_OUTPATIENT_CLINIC_OR_DEPARTMENT_OTHER): Payer: Self-pay

## 2021-04-29 DIAGNOSIS — J309 Allergic rhinitis, unspecified: Secondary | ICD-10-CM

## 2021-05-02 ENCOUNTER — Other Ambulatory Visit (HOSPITAL_COMMUNITY): Payer: Self-pay

## 2021-05-03 ENCOUNTER — Other Ambulatory Visit (HOSPITAL_COMMUNITY): Payer: Self-pay

## 2021-05-04 ENCOUNTER — Other Ambulatory Visit (HOSPITAL_COMMUNITY): Payer: Self-pay

## 2021-05-04 MED ORDER — ALPRAZOLAM 0.25 MG PO TABS
0.2500 mg | ORAL_TABLET | Freq: Every evening | ORAL | 0 refills | Status: DC | PRN
  Filled 2021-05-04: qty 30, 30d supply, fill #0

## 2021-05-06 ENCOUNTER — Ambulatory Visit (INDEPENDENT_AMBULATORY_CARE_PROVIDER_SITE_OTHER): Payer: No Typology Code available for payment source

## 2021-05-06 DIAGNOSIS — J309 Allergic rhinitis, unspecified: Secondary | ICD-10-CM

## 2021-05-11 ENCOUNTER — Encounter: Payer: Self-pay | Admitting: Emergency Medicine

## 2021-05-11 ENCOUNTER — Ambulatory Visit
Admission: EM | Admit: 2021-05-11 | Discharge: 2021-05-11 | Disposition: A | Discharge status: Home / Self Care | Payer: No Typology Code available for payment source | Attending: Physician Assistant | Admitting: Physician Assistant

## 2021-05-11 ENCOUNTER — Other Ambulatory Visit (HOSPITAL_COMMUNITY): Payer: Self-pay | Admitting: Radiology

## 2021-05-11 ENCOUNTER — Other Ambulatory Visit: Payer: Self-pay

## 2021-05-11 DIAGNOSIS — J029 Acute pharyngitis, unspecified: Secondary | ICD-10-CM | POA: Diagnosis present

## 2021-05-11 DIAGNOSIS — R590 Localized enlarged lymph nodes: Secondary | ICD-10-CM

## 2021-05-11 LAB — POCT RAPID STREP A (OFFICE): Rapid Strep A Screen: NEGATIVE

## 2021-05-11 NOTE — ED Provider Notes (Signed)
[facility]    CSN: [id number] Arrival date & time: 05/11/21  0801      History   Chief Complaint No chief complaint on file.   HPI Theresa [name] is a 50 y.o. female.   Pt complains of a sore throat.  Pt is a hospital employee.  Pt had a negative covid test yesterday   The history is provided by the patient. No language interpreter was used.  Sore Throat This is a new problem. The current episode started yesterday. The problem occurs constantly. The problem has been gradually worsening. Nothing aggravates the symptoms. Nothing relieves the symptoms. She has tried nothing for the symptoms. The treatment provided no relief.   Past Medical History:  Diagnosis Date   Allergy    Allergy to alpha-gal    Anxiety    Colon adenoma DEC 2014   ONE(7 MM) SIMPLE([place])   Depression    Edema    GERD (gastroesophageal reflux disease)    History of abnormal cervical Pap smear 12/02/2013   History of nephrolithiasis    HPV (human papilloma virus) infection    Hyperlipidemia    IBS (irritable bowel syndrome)    constipation   Insomnia    Obesity 01/14/2014   Pre-diabetes 09/2014   Superficial basal cell carcinoma (BCC) 12/26/2019   Right Breast- treatment Imiquimoid   Yeast infection 09/04/2013    Patient Active Problem List   Diagnosis Date Noted   Sore throat 09/28/2020   Streptococcal sore throat 09/28/2020   Muscle pain 07/12/2020   Lymphadenopathy 07/12/2020   Separation of right acromioclavicular joint 02/17/2019   Concussion with loss of consciousness 02/17/2019   Idiopathic anaphylaxis 02/12/2019   Chronic rhinitis 02/12/2019   Chronic idiopathic urticaria 02/12/2019   Hx of colonic polyps 07/17/2018   Gastroesophageal reflux disease 07/17/2018   Positive test for Epstein-Barr virus (EBV) 08/21/2017   Anxiety and depression 07/28/2017   Fatigue 07/12/2015   Arthralgia 07/12/2015   AP (abdominal pain) 07/06/2015   Obesity 01/14/2014   History of abnormal  cervical Pap smear 12/02/2013   Internal hemorrhoids with other complication 70/26/3785   Generalized anxiety disorder 12/25/2012   GERD (gastroesophageal reflux disease) 10/31/2011   GASTROESOPHAGEAL REFLUX DISEASE 07/13/2008   HEARTBURN 07/13/2008   Constipation 05/13/2008   BLOOD IN STOOL 05/13/2008   CHANGE IN BOWELS 05/13/2008    Past Surgical History:  Procedure Laterality Date   BIOPSY N/A 08/01/2013   Procedure: BIOPSY;  Surgeon: Danie Binder, MD;  [facility];  Service: Endoscopy;  Laterality: N/A;  FOR MICROSCOPIC COLITIS   BIOPSY  08/09/2018   Procedure: BIOPSY;  Surgeon: Rogene Houston, MD;  [facility]: [facility];  Service: Endoscopy;;  gastric   BREAST REDUCTION SURGERY  2009   BREAST SURGERY  2007   breast reduction   CHOLECYSTECTOMY N/A 11/04/2014   Procedure: LAPAROSCOPIC CHOLECYSTECTOMY;  Surgeon: Aviva Signs Md, MD;  Location: [facility];  Service: General;  Laterality: N/A;   COLONOSCOPY  Oct 2009   Dr. Carlean Purl: int/ext hemorrhoids, ?mild proctitis but path benign, likely r/t irritation from bowel prep   COLONOSCOPY N/A 08/01/2013   Procedure: COLONOSCOPY;  Surgeon: Danie Binder, MD;  [facility]: [facility];  Service: Endoscopy;  Laterality: N/A;  12:00   COLONOSCOPY WITH PROPOFOL N/A 08/09/2018   Procedure: COLONOSCOPY WITH PROPOFOL;  Surgeon: Rogene Houston, MD;  [facility]: [facility];  Service: Endoscopy;  Laterality: N/A;  730   ESOPHAGOGASTRODUODENOSCOPY  Dec 2009  Dr. Carlean Purl: reflux esophagitis, proximal gastric polyps, benign   ESOPHAGOGASTRODUODENOSCOPY N/A 12/18/2014   Procedure: ESOPHAGOGASTRODUODENOSCOPY (EGD);  Surgeon: Rogene Houston, MD;  [facility];  Service: Endoscopy;  Laterality: N/A;  230   ESOPHAGOGASTRODUODENOSCOPY (EGD) WITH PROPOFOL N/A 08/09/2018   Procedure: ESOPHAGOGASTRODUODENOSCOPY (EGD) WITH PROPOFOL;  Surgeon: Rogene Houston, MD;  [facility];  Service: Endoscopy;  Laterality: N/A;    HEMORRHOID BANDING N/A 08/01/2013   Procedure: HEMORRHOID BANDING;  Surgeon: Danie Binder, MD;  [facility];  Service: Endoscopy;  Laterality: N/A;  internal hemorrhoid banding   LIVER BIOPSY N/A 11/04/2014   Procedure: LIVER BIOPSY;  Surgeon: Aviva Signs Md, MD;  Location: [facility];  Service: General;  Laterality: N/A;   POLYPECTOMY  2015   Dr.Fields   POLYPECTOMY  08/09/2018   Procedure: POLYPECTOMY;  Surgeon: Rogene Houston, MD;  [facility];  Service: Endoscopy;;  colon   REDUCTION MAMMAPLASTY Bilateral 2006   UPPER GASTROINTESTINAL ENDOSCOPY  2006   Dr. Nita Sickle in [place]    OB History     Gravida  1   Para  1   Term      Preterm      AB      Living  1      SAB      IAB      Ectopic      Multiple      Live Births  1            Home Medications    Prior to Admission medications   Medication Sig Start Date End Date Taking? Authorizing Provider  albuterol (VENTOLIN HFA) 108 (90 Base) MCG/ACT inhaler Inhale 2 puffs into the lungs every 4 (four) hours as needed for wheezing or shortness of breath. 09/08/20   Lovena Le, Malena M, DO  ALPRAZolam Duanne Moron) 0.25 MG tablet Take 1 tablet (0.25 mg total) by mouth at bedtime as needed. 05/04/21     ALPRAZolam (XANAX) 0.5 MG tablet Take 1 tablet (0.5 mg total) by mouth daily as needed for anxiety. Need appointment for more refills. 01/17/21   Elvia Collum M, DO  citalopram (CELEXA) 40 MG tablet Take 1 tablet (40 mg total) by mouth daily. 04/28/21   Elvia Collum M, DO  cycloSPORINE (RESTASIS) 0.05 % ophthalmic emulsion Place 1 drop into both eyes 2 (two) times daily. 11/16/20     dicyclomine (BENTYL) 20 MG tablet Take 20 mg by mouth daily as needed for spasms.    [provider]  EPINEPHrine (EPIPEN 2-PAK) 0.3 mg/0.3 mL IJ SOAJ injection Inject 0.3 mLs (0.3 mg total) into the muscle as needed for anaphylaxis. 09/10/19   Valentina Shaggy, MD  fluconazole (DIFLUCAN) 150 MG tablet Take 1  tablet (150 mg total) by mouth as directed. May repeat in 3 days if still symptomatic 01/25/21     fluticasone (FLONASE) 50 MCG/ACT nasal spray Place 1 spray into both nostrils 2 (two) times daily as needed for allergies or rhinitis 04/03/21   Valentina Shaggy, MD  imiquimod (ALDARA) 5 % cream APPLY TOPICALLY TO AFFECTED AREA NIGHTLY ON MONDAY, WEDNESDAY, AND FRIDAY FOR 8 WEEKS. 06/03/20 06/03/21  Lavonna Monarch, MD  ipratropium (ATROVENT) 0.06 % nasal spray Place 2 sprays into both nostrils 3 (three) times daily. 04/03/21   Valentina Shaggy, MD  lansoprazole (PREVACID) 30 MG capsule Take 1 capsule (30 mg total) by mouth daily at 12 noon. 03/27/21   Rehman, Mechele Dawley,  MD  levocetirizine (XYZAL) 5 MG tablet Take 1 tablet (5 mg total) by mouth every evening. 04/03/21   Valentina Shaggy, MD  levonorgestrel (MIRENA) 20 MCG/24HR IUD 1 each by Intrauterine route once.    [provider]  podofilox (CONDYLOX) 0.5 % external solution APPLY BY TOPICAL ROUTE 2 TIMES PER DAY FOR 3 DAYS THEN STOP FOR 4 DAYS. (REPEAT 7DAY CYCLE UNTIL NO VISIBLE WART TISSUE/MAX OF FOUR CYCLES) 01/18/21     prednisoLONE acetate (PRED FORTE) 1 % ophthalmic suspension Place 1 drop into both eyes 2 (two) times daily as needed. 12/14/20   Valentina Shaggy, MD  predniSONE (DELTASONE) 10 MG tablet Take 1 tablet (10 mg total) by mouth daily with breakfast. 09/27/20   Valentina Shaggy, MD  valACYclovir (VALTREX) 1000 MG tablet Take 1,000 mg by mouth daily. 08/27/19   [provider]  valACYclovir (VALTREX) 1000 MG tablet Take 1 tablet (1,000 mg total) by mouth daily. 01/18/21       Family History Family History  Problem Relation Age of Onset   Cancer Mother        head and neck   Hypertension Father    Hyperlipidemia Father    Pancreatic cancer Maternal Grandfather    Colon cancer Neg Hx     Social History Social History   Tobacco Use   Smoking status: Never   Smokeless tobacco: Never   Tobacco  comments:    Never smoker  Vaping Use   Vaping Use: Never used  Substance Use Topics   Alcohol use: Yes    Alcohol/week: 0.0 standard drinks    Comment: socially   Drug use: No     Allergies   Heparin, Ativan [lorazepam], and Phentermine   Review of Systems Review of Systems  All other systems reviewed and are negative.   Physical Exam Triage Vital Signs ED Triage Vitals  Enc Vitals Group     BP 05/11/21 0810 125/77     Pulse Rate 05/11/21 0810 83     Resp 05/11/21 0810 18     Temp 05/11/21 0810 98.1 F (36.7 C)     Temp Source 05/11/21 0810 Oral     SpO2 05/11/21 0810 97 %     Weight --      Height --      Head Circumference --      Peak Flow --      Pain Score 05/11/21 0811 2     Pain Loc --      Pain Edu? --      Excl. in [place]? --    No data found.  Updated Vital Signs BP 125/77 (BP Location: Right Arm)   Pulse 83   Temp 98.1 F (36.7 C) (Oral)   Resp 18   SpO2 97%   Visual Acuity Right Eye Distance:   Left Eye Distance:   Bilateral Distance:    Right Eye Near:   Left Eye Near:    Bilateral Near:     Physical Exam Vitals and nursing note reviewed.  Constitutional:      Appearance: She is well-developed.  HENT:     Head: Normocephalic.     Mouth/Throat:     Pharynx: Posterior oropharyngeal erythema present.  Cardiovascular:     Rate and Rhythm: Normal rate.  Pulmonary:     Effort: Pulmonary effort is normal.  Abdominal:     General: There is no distension.  Musculoskeletal:        General:  Normal range of motion.     Cervical back: Normal range of motion.  Neurological:     Mental Status: She is alert and oriented to person, place, and time.     UC Treatments / Results  Labs (all labs ordered are listed, but only abnormal results are displayed) Labs Reviewed  POCT RAPID STREP A (OFFICE)    EKG   Radiology No results found.  Procedures Procedures (including critical care time)  Medications Ordered in UC Medications - No  data to display  Initial Impression / Assessment and Plan / UC Course  I have reviewed the triage vital signs and the nursing notes.  Pertinent labs & imaging results that were available during my care of the patient were reviewed by me and considered in my medical decision making (see chart for details).     MDM:  Pt strep negative  Final Clinical Impressions(s) / UC Diagnoses   Final diagnoses:  Acute pharyngitis, unspecified etiology   Discharge Instructions   None    ED Prescriptions   None    PDMP not reviewed this encounter. An After Visit Summary was printed and given to the patient.    Fransico Meadow, [place] 05/11/21 [phone]

## 2021-05-11 NOTE — ED Triage Notes (Signed)
Scratchy throat, fatigue since Sunday with headache.  Tested for covid by health at work and test was negative.  States having some upper chest pain on left side.

## 2021-05-14 LAB — CULTURE, GROUP A STREP (THRC)

## 2021-05-17 ENCOUNTER — Other Ambulatory Visit (HOSPITAL_COMMUNITY): Payer: Self-pay | Admitting: Nurse Practitioner

## 2021-05-20 ENCOUNTER — Ambulatory Visit (INDEPENDENT_AMBULATORY_CARE_PROVIDER_SITE_OTHER): Payer: No Typology Code available for payment source

## 2021-05-20 ENCOUNTER — Other Ambulatory Visit: Payer: Self-pay

## 2021-05-20 ENCOUNTER — Encounter: Payer: Self-pay | Admitting: Internal Medicine

## 2021-05-20 ENCOUNTER — Ambulatory Visit (INDEPENDENT_AMBULATORY_CARE_PROVIDER_SITE_OTHER): Payer: No Typology Code available for payment source | Admitting: Internal Medicine

## 2021-05-20 ENCOUNTER — Other Ambulatory Visit (HOSPITAL_COMMUNITY): Payer: Self-pay

## 2021-05-20 VITALS — BP 136/86 | HR 94 | Temp 98.3°F | Resp 16 | Ht 66.0 in | Wt 207.1 lb

## 2021-05-20 DIAGNOSIS — M797 Fibromyalgia: Secondary | ICD-10-CM

## 2021-05-20 DIAGNOSIS — K219 Gastro-esophageal reflux disease without esophagitis: Secondary | ICD-10-CM

## 2021-05-20 DIAGNOSIS — Z7689 Persons encountering health services in other specified circumstances: Secondary | ICD-10-CM | POA: Diagnosis not present

## 2021-05-20 DIAGNOSIS — J309 Allergic rhinitis, unspecified: Secondary | ICD-10-CM | POA: Diagnosis not present

## 2021-05-20 DIAGNOSIS — I10 Essential (primary) hypertension: Secondary | ICD-10-CM | POA: Diagnosis not present

## 2021-05-20 DIAGNOSIS — J310 Chronic rhinitis: Secondary | ICD-10-CM

## 2021-05-20 DIAGNOSIS — R7303 Prediabetes: Secondary | ICD-10-CM

## 2021-05-20 DIAGNOSIS — K5904 Chronic idiopathic constipation: Secondary | ICD-10-CM

## 2021-05-20 DIAGNOSIS — F411 Generalized anxiety disorder: Secondary | ICD-10-CM

## 2021-05-20 MED ORDER — ALPRAZOLAM ER 1 MG PO TB24: 1.0000 mg | ORAL_TABLET | Freq: Every day | ORAL | 0 refills | Status: DC

## 2021-05-20 MED ORDER — HYDROCHLOROTHIAZIDE 12.5 MG PO CAPS
12.5000 mg | ORAL_CAPSULE | Freq: Every day | ORAL | 2 refills | Status: DC
  Filled 2021-05-20: qty 30, 30d supply, fill #0

## 2021-05-20 NOTE — Assessment & Plan Note (Signed)
BP Readings from Last 1 Encounters:  05/20/21 136/86   New-onset Started HCTZ 12.5 mg for added benefit with leg swelling Counseled for compliance with the medications Advised DASH diet and moderate exercise/walking, at least 150 mins/week

## 2021-05-20 NOTE — Assessment & Plan Note (Signed)
On Prevacid 

## 2021-05-20 NOTE — Assessment & Plan Note (Addendum)
On Xanax 0.5 mg daily Was on higher doses in the past, but has been having severe anxiety and affecting her work since decreasing Xanax dose recently Will increase the dose to 1 mg unchanged take 2 Xanax XR form for now On Celexa, may switch to Cymbalta for added benefit with fibromyalgia if needed

## 2021-05-20 NOTE — Assessment & Plan Note (Signed)
Care established Previous chart reviewed History and medications reviewed with the patient 

## 2021-05-20 NOTE — Assessment & Plan Note (Addendum)
Followed by Allergy and Immunology Gets allergy shots every week. On Xyzal Uses flonase

## 2021-05-20 NOTE — Assessment & Plan Note (Signed)
Had Rheumatology evaluation in 2019, was told of fibromyalgia, but did not start any treatment at that time Referred to [place] as per patient request May switch her Celexa to Cymbalta later

## 2021-05-20 NOTE — Assessment & Plan Note (Signed)
Takes senna as needed

## 2021-05-20 NOTE — Assessment & Plan Note (Signed)
Lab Results  Component Value Date   HGBA1C 5.8 (H) 06/21/2017   Continue to follow low carb diet

## 2021-05-20 NOTE — Patient Instructions (Signed)
Please start taking HCTZ as discussed.  You are being referred to Rheumatology clinic for fibromyalgia.  Please start taking Xanax XR instead of Xanax.  Continue to take other medications as prescribed.  Please continue to perform leg elevation and compression socks for leg swelling.

## 2021-05-20 NOTE — Progress Notes (Signed)
New Patient Office Visit  Subjective:  Patient ID: Theresa [name], female    DOB: 1971/04/21  Age: 50 y.o. MRN: [id number]  CC:  Chief Complaint  Patient presents with   New Patient (Initial Visit)    New patient was seeing dr Wolfgang Phoenix / dr taylor was diagnosed with fibromyalgia has requested records has not received yet but she has had flare ups the last 9 months if feel the need to refer out prefers dr Marijean Bravo also pt was taking xanax was weaned down to 0.5 mg but she feels like she needs to take 1 mg     HPI Theresa [name] is a 50 year old female with PMH of GERD, chronic rhinitis, GAD, fibromyalgia and prediabetes who presents for establishing care.  She is a Marine scientist at [employer].  She reports having severe fatigue and diffuse muscle aches.  Of note, she was diagnosed with fibromyalgia in 2019, but was not started on any medication at that time.  She requested referral to Rheumatology clinic.  She also has chronic dry eyes, but previous evaluation with Rheumatology did not suggest Sjogren syndrome.  She has been taking Xanax 0.5 mg once daily.  She used to take higher doses of Xanax in the past and has been trying to titrate it down.  With 0.5 mg QD dose, she has been feeling anxious and it is affecting her work as well.  She also takes Celexa.  She denies any anhedonia, SI or HI.  Her BP was elevated initially during the office visit, which improved slightly later.  She also reports chronic leg swelling up to thigh at times at the end of her work.  She denies any headache, dizziness, chest pain, dyspnea or palpitations.  She has had COVID vaccine.  Past Medical History:  Diagnosis Date   Allergy    Allergy to alpha-gal    Anxiety    Colon adenoma DEC 2014   ONE(7 MM) SIMPLE([place])   Concussion with loss of consciousness 02/17/2019   Depression    Edema    GERD (gastroesophageal reflux disease)    History of abnormal cervical Pap smear 12/02/2013   History of nephrolithiasis    HPV (human  papilloma virus) infection    Hyperlipidemia    IBS (irritable bowel syndrome)    constipation   Insomnia    Lymphadenopathy 07/12/2020   Obesity 01/14/2014   Positive test for Epstein-Barr virus (EBV) 08/21/2017   Pre-diabetes 09/2014   Superficial basal cell carcinoma (BCC) 12/26/2019   Right Breast- treatment Imiquimoid   Yeast infection 09/04/2013    Past Surgical History:  Procedure Laterality Date   BIOPSY N/A 08/01/2013   Procedure: BIOPSY;  Surgeon: Danie Binder, MD;  [facility];  Service: Endoscopy;  Laterality: N/A;  FOR MICROSCOPIC COLITIS   BIOPSY  08/09/2018   Procedure: BIOPSY;  Surgeon: Rogene Houston, MD;  [facility];  Service: Endoscopy;;  gastric   BREAST REDUCTION SURGERY  2009   BREAST SURGERY  2007   breast reduction   CHOLECYSTECTOMY N/A 11/04/2014   Procedure: LAPAROSCOPIC CHOLECYSTECTOMY;  Surgeon: Aviva Signs Md, MD;  Location: [facility];  Service: General;  Laterality: N/A;   COLONOSCOPY  Oct 2009   Dr. Carlean Purl: int/ext hemorrhoids, ?mild proctitis but path benign, likely r/t irritation from bowel prep   COLONOSCOPY N/A 08/01/2013   Procedure: COLONOSCOPY;  Surgeon: Danie Binder, MD;  [facility];  Service: Endoscopy;  Laterality: N/A;  12:00  COLONOSCOPY WITH PROPOFOL N/A 08/09/2018   Procedure: COLONOSCOPY WITH PROPOFOL;  Surgeon: Rogene Houston, MD;  [facility];  Service: Endoscopy;  Laterality: N/A;  730   ESOPHAGOGASTRODUODENOSCOPY  Dec 2009   Dr. Carlean Purl: reflux esophagitis, proximal gastric polyps, benign   ESOPHAGOGASTRODUODENOSCOPY N/A 12/18/2014   Procedure: ESOPHAGOGASTRODUODENOSCOPY (EGD);  Surgeon: Rogene Houston, MD;  [facility];  Service: Endoscopy;  Laterality: N/A;  230   ESOPHAGOGASTRODUODENOSCOPY (EGD) WITH PROPOFOL N/A 08/09/2018   Procedure: ESOPHAGOGASTRODUODENOSCOPY (EGD) WITH PROPOFOL;  Surgeon: Rogene Houston, MD;  [facility];  Service: Endoscopy;   Laterality: N/A;   HEMORRHOID BANDING N/A 08/01/2013   Procedure: HEMORRHOID BANDING;  Surgeon: Danie Binder, MD;  [facility];  Service: Endoscopy;  Laterality: N/A;  internal hemorrhoid banding   LIVER BIOPSY N/A 11/04/2014   Procedure: LIVER BIOPSY;  Surgeon: Aviva Signs Md, MD;  [facility];  Service: General;  Laterality: N/A;   POLYPECTOMY  2015   Dr.Fields   POLYPECTOMY  08/09/2018   Procedure: POLYPECTOMY;  Surgeon: Rogene Houston, MD;  [facility];  Service: Endoscopy;;  colon   REDUCTION MAMMAPLASTY Bilateral 2006   UPPER GASTROINTESTINAL ENDOSCOPY  2006   Dr. Nita Sickle in [place]    Family History  Problem Relation Age of Onset   Cancer Mother        head and neck   Hypertension Father    Hyperlipidemia Father    Pancreatic cancer Maternal Grandfather    Colon cancer Neg Hx     Social History   Socioeconomic History   Marital status: Married    Spouse name: Not on file   Number of children: 1   Years of education: Not on file   Highest education level: Not on file  Occupational History   Occupation: Therapist, sports    Employer: [employer]  Tobacco Use   Smoking status: Never   Smokeless tobacco: Never   Tobacco comments:    Never smoker  Vaping Use   Vaping Use: Never used  Substance and Sexual Activity   Alcohol use: Yes    Alcohol/week: 0.0 standard drinks    Comment: socially   Drug use: No   Sexual activity: Yes    Birth control/protection: I.U.D.  Other Topics Concern   Not on file  Social History Narrative   Not on file   Social Determinants of Health   Financial Resource Strain: Not on file  Food Insecurity: Not on file  Transportation Needs: Not on file  Physical Activity: Not on file  Stress: Not on file  Social Connections: Not on file  Intimate Partner Violence: Not on file    ROS Review of Systems  Constitutional:  Positive for fatigue. Negative for chills and fever.  HENT:  Negative for congestion,  sinus pressure, sinus pain and sore throat.   Eyes:  Negative for pain and discharge.  Respiratory:  Negative for cough and shortness of breath.   Cardiovascular:  Positive for leg swelling. Negative for chest pain and palpitations.  Gastrointestinal:  Positive for constipation. Negative for abdominal pain, nausea and vomiting.  Endocrine: Negative for polydipsia and polyuria.  Genitourinary:  Negative for dysuria and hematuria.  Musculoskeletal:  Positive for arthralgias and myalgias. Negative for neck stiffness.  Skin:  Negative for rash.  Allergic/Immunologic: Positive for environmental allergies.  Neurological:  Negative for dizziness and weakness.  Psychiatric/Behavioral:  Positive for sleep disturbance. Negative for agitation and behavioral problems.  The patient is nervous/anxious.    Objective:   Today's Vitals: BP 136/86 (BP Location: Left Arm, Cuff Size: Normal)   Pulse 94   Temp 98.3 F (36.8 C) (Oral)   Resp 16   Ht 5\' 6"  (1.676 m)   Wt 207 lb 1.9 oz (93.9 kg)   SpO2 97%   BMI 33.43 kg/m   Physical Exam Vitals reviewed.  Constitutional:      General: She is not in acute distress.    Appearance: She is not diaphoretic.  HENT:     Head: Normocephalic and atraumatic.     Nose: Nose normal.     Mouth/Throat:     Mouth: Mucous membranes are moist.  Eyes:     General: No scleral icterus.    Extraocular Movements: Extraocular movements intact.  Cardiovascular:     Rate and Rhythm: Normal rate and regular rhythm.     Pulses: Normal pulses.     Heart sounds: Normal heart sounds. No murmur heard. Pulmonary:     Breath sounds: Normal breath sounds. No wheezing or rales.  Musculoskeletal:     Cervical back: Neck supple. No tenderness.     Right lower leg: No edema.     Left lower leg: No edema.  Skin:    General: Skin is warm.     Findings: No rash.  Neurological:     General: No focal deficit present.     Mental Status: She is alert and oriented to person, place,  and time.     Sensory: No sensory deficit.     Motor: No weakness.  Psychiatric:        Mood and Affect: Mood normal.        Behavior: Behavior normal.    Assessment & Plan:   Problem List Items Addressed This Visit       Encounter to establish care - Primary   Care established Previous chart reviewed History and medications reviewed with the patient       Cardiovascular and Mediastinum   Essential hypertension    BP Readings from Last 1 Encounters:  05/20/21 136/86  New-onset Started HCTZ 12.5 mg for added benefit with leg swelling Counseled for compliance with the medications Advised DASH diet and moderate exercise/walking, at least 150 mins/week       Relevant Medications   hydrochlorothiazide (MICROZIDE) 12.5 MG capsule     Respiratory   Chronic rhinitis    Followed by Allergy and Immunology Gets allergy shots every week. On Xyzal Uses flonase        Digestive   GERD (gastroesophageal reflux disease)    On Prevacid      Relevant Medications   senna (SENOKOT) 8.6 MG tablet     Other   Constipation    Takes senna as needed      Generalized anxiety disorder    On Xanax 0.5 mg daily Was on higher doses in the past, but has been having severe anxiety and affecting her work since decreasing Xanax dose recently Will increase the dose to 1 mg unchanged take 2 Xanax XR form for now On Celexa, may switch to Cymbalta for added benefit with fibromyalgia if needed      Relevant Medications   ALPRAZolam (XANAX XR) 1 MG 24 hr tablet   Prediabetes    Lab Results  Component Value Date   HGBA1C 5.8 (H) 06/21/2017  Continue to follow low carb diet      Fibromyalgia    Had Rheumatology  evaluation in 2019, was told of fibromyalgia, but did not start any treatment at that time Referred to [facility] as per patient request May switch her Celexa to Cymbalta later      Relevant Orders   Ambulatory referral to Rheumatology           Outpatient Encounter Medications as of 05/20/2021  Medication Sig   ALPRAZolam (XANAX XR) 1 MG 24 hr tablet Take 1 tablet (1 mg total) by mouth daily.   citalopram (CELEXA) 40 MG tablet Take 1 tablet (40 mg total) by mouth daily.   cycloSPORINE (RESTASIS) 0.05 % ophthalmic emulsion Place 1 drop into both eyes 2 (two) times daily.   dicyclomine (BENTYL) 20 MG tablet Take 20 mg by mouth daily as needed for spasms.   EPINEPHrine (EPIPEN 2-PAK) 0.3 mg/0.3 mL IJ SOAJ injection Inject 0.3 mLs (0.3 mg total) into the muscle as needed for anaphylaxis.   fluticasone (FLONASE) 50 MCG/ACT nasal spray Place 1 spray into both nostrils 2 (two) times daily as needed for allergies or rhinitis   hydrochlorothiazide (MICROZIDE) 12.5 MG capsule Take 1 capsule (12.5 mg total) by mouth daily.   ipratropium (ATROVENT) 0.06 % nasal spray Place 2 sprays into both nostrils 3 (three) times daily.   lansoprazole (PREVACID) 30 MG capsule Take 1 capsule (30 mg total) by mouth daily at 12 noon.   levocetirizine (XYZAL) 5 MG tablet Take 1 tablet (5 mg total) by mouth every evening.   levonorgestrel (MIRENA) 20 MCG/24HR IUD 1 each by Intrauterine route once.   podofilox (CONDYLOX) 0.5 % external solution APPLY BY TOPICAL ROUTE 2 TIMES PER DAY FOR 3 DAYS THEN STOP FOR 4 DAYS. (REPEAT 7DAY CYCLE UNTIL NO VISIBLE WART TISSUE/MAX OF FOUR CYCLES)   senna (SENOKOT) 8.6 MG tablet Take 1 tablet by mouth daily.   valACYclovir (VALTREX) 1000 MG tablet Take 1,000 mg by mouth daily.   [DISCONTINUED] ALPRAZolam (XANAX) 0.5 MG tablet Take 1 tablet (0.5 mg total) by mouth daily as needed for anxiety. Need appointment for more refills.   [DISCONTINUED] albuterol (VENTOLIN HFA) 108 (90 Base) MCG/ACT inhaler Inhale 2 puffs into the lungs every 4 (four) hours as needed for wheezing or shortness of breath. (Patient not taking: Reported on 05/20/2021)   [DISCONTINUED] ALPRAZolam (XANAX) 0.25 MG tablet Take 1 tablet (0.25 mg total) by mouth at  bedtime as needed. (Patient not taking: Reported on 05/20/2021)   [DISCONTINUED] fluconazole (DIFLUCAN) 150 MG tablet Take 1 tablet (150 mg total) by mouth as directed. May repeat in 3 days if still symptomatic (Patient not taking: Reported on 05/20/2021)   [DISCONTINUED] imiquimod (ALDARA) 5 % cream APPLY TOPICALLY TO AFFECTED AREA NIGHTLY ON MONDAY, WEDNESDAY, AND FRIDAY FOR 8 WEEKS. (Patient not taking: Reported on 05/20/2021)   [DISCONTINUED] prednisoLONE acetate (PRED FORTE) 1 % ophthalmic suspension Place 1 drop into both eyes 2 (two) times daily as needed. (Patient not taking: Reported on 05/20/2021)   [DISCONTINUED] predniSONE (DELTASONE) 10 MG tablet Take 1 tablet (10 mg total) by mouth daily with breakfast. (Patient not taking: Reported on 05/20/2021)   [DISCONTINUED] valACYclovir (VALTREX) 1000 MG tablet Take 1 tablet (1,000 mg total) by mouth daily. (Patient not taking: Reported on 05/20/2021)   No facility-administered encounter medications on file as of 05/20/2021.    Follow-up: Return in about 4 months (around 09/20/2021) for HTN, anxiety and fibromyalgia.   Lindell Spar, MD

## 2021-05-23 ENCOUNTER — Encounter: Payer: Self-pay | Admitting: Internal Medicine

## 2021-05-25 ENCOUNTER — Encounter: Payer: Self-pay | Admitting: Internal Medicine

## 2021-05-25 ENCOUNTER — Other Ambulatory Visit: Payer: Self-pay | Admitting: *Deleted

## 2021-05-25 ENCOUNTER — Ambulatory Visit (INDEPENDENT_AMBULATORY_CARE_PROVIDER_SITE_OTHER): Payer: No Typology Code available for payment source

## 2021-05-25 ENCOUNTER — Other Ambulatory Visit: Payer: Self-pay | Admitting: Internal Medicine

## 2021-05-25 DIAGNOSIS — F32A Depression, unspecified: Secondary | ICD-10-CM

## 2021-05-25 DIAGNOSIS — J309 Allergic rhinitis, unspecified: Secondary | ICD-10-CM

## 2021-05-25 DIAGNOSIS — M797 Fibromyalgia: Secondary | ICD-10-CM

## 2021-05-25 MED ORDER — DULOXETINE HCL 30 MG PO CPEP: 30.0000 mg | ORAL_CAPSULE | Freq: Every day | ORAL | 0 refills | Status: DC

## 2021-05-27 ENCOUNTER — Ambulatory Visit (HOSPITAL_COMMUNITY)
Admission: RE | Admit: 2021-05-27 | Discharge: 2021-05-27 | Disposition: A | Discharge status: Home / Self Care | Payer: No Typology Code available for payment source | Source: Ambulatory Visit | Attending: Radiology | Admitting: Radiology

## 2021-05-27 ENCOUNTER — Other Ambulatory Visit: Payer: Self-pay

## 2021-05-27 DIAGNOSIS — R590 Localized enlarged lymph nodes: Secondary | ICD-10-CM | POA: Insufficient documentation

## 2021-05-31 ENCOUNTER — Encounter (HOSPITAL_COMMUNITY): Payer: No Typology Code available for payment source

## 2021-05-31 ENCOUNTER — Ambulatory Visit (HOSPITAL_COMMUNITY): Payer: No Typology Code available for payment source

## 2021-06-03 ENCOUNTER — Ambulatory Visit (INDEPENDENT_AMBULATORY_CARE_PROVIDER_SITE_OTHER): Payer: No Typology Code available for payment source

## 2021-06-03 DIAGNOSIS — J309 Allergic rhinitis, unspecified: Secondary | ICD-10-CM

## 2021-06-06 ENCOUNTER — Encounter: Payer: Self-pay | Admitting: Internal Medicine

## 2021-06-07 ENCOUNTER — Ambulatory Visit: Payer: No Typology Code available for payment source | Admitting: Physician Assistant

## 2021-06-08 ENCOUNTER — Encounter: Payer: Self-pay | Admitting: Internal Medicine

## 2021-06-08 ENCOUNTER — Other Ambulatory Visit: Payer: Self-pay

## 2021-06-08 ENCOUNTER — Ambulatory Visit (INDEPENDENT_AMBULATORY_CARE_PROVIDER_SITE_OTHER): Payer: No Typology Code available for payment source | Admitting: Internal Medicine

## 2021-06-08 DIAGNOSIS — F411 Generalized anxiety disorder: Secondary | ICD-10-CM

## 2021-06-08 DIAGNOSIS — M797 Fibromyalgia: Secondary | ICD-10-CM

## 2021-06-08 MED ORDER — HYDROXYZINE PAMOATE 25 MG PO CAPS: 25.0000 mg | ORAL_CAPSULE | Freq: Every evening | ORAL | 0 refills | Status: DC | PRN

## 2021-06-08 NOTE — Assessment & Plan Note (Signed)
Was on Xanax 0.5 mg daily Was on higher doses in the past, but has been having severe anxiety and affecting her work since decreasing Xanax dose recently Had increased the dose to 1 mg, Xanax XR form for now Had switched to Cymbalta for benefit with fibromyalgia Vistaril as needed for insomnia

## 2021-06-08 NOTE — Assessment & Plan Note (Signed)
Had Rheumatology evaluation in 2019, was told of fibromyalgia, but did not start any treatment at that time Recently started Cymbalta If persistent symptoms or insomnia, may switch to Elavil

## 2021-06-08 NOTE — Progress Notes (Signed)
Virtual Visit via Telephone Note   This visit type was conducted due to national recommendations for restrictions regarding the COVID-19 Pandemic (e.g. social distancing) in an effort to limit this patient's exposure and mitigate transmission in our community.  Due to her co-morbid illnesses, this patient is at least at moderate risk for complications without adequate follow up.  This format is felt to be most appropriate for this patient at this time.  The patient did not have access to video technology/had technical difficulties with video requiring transitioning to audio format only (telephone).  All issues noted in this document were discussed and addressed.  No physical exam could be performed with this format.  Evaluation Performed:  Follow-up visit  Date:  06/08/2021   ID:  Theresa [name], DOB 1970/08/28, MRN [id number]  Patient Location: Home Provider Location: Office/Clinic  Participants: Patient Location of Patient: Home Location of Provider: Telehealth Consent was obtain for visit to be over via telehealth. I verified that I am speaking with the correct person using two identifiers.  PCP:  Lindell Spar, MD   Chief Complaint: Insomnia and fatigue  History of Present Illness:    Theresa [name] is a 50 y.o. female who has a televisit for complaint of insomnia, which is worse for last 3 days.  She also complains of chronic fatigue related to fibromyalgia.  She has recently started taking Cymbalta and has stopped Celexa, which she was taking for anxiety.  She continues to take Xanax XR 1 mg for insomnia.  She denies any recent change in her work schedule.  Denies any anhedonia, SI or HI.  The patient does not have symptoms concerning for COVID-19 infection (fever, chills, cough, or new shortness of breath).   Past Medical, Surgical, Social History, Allergies, and Medications have been Reviewed.  Past Medical History:  Diagnosis Date   Allergy    Allergy to alpha-gal    Anxiety     Colon adenoma DEC 2014   ONE(7 MM) SIMPLE([place])   Concussion with loss of consciousness 02/17/2019   Depression    Edema    GERD (gastroesophageal reflux disease)    History of abnormal cervical Pap smear 12/02/2013   History of nephrolithiasis    HPV (human papilloma virus) infection    Hyperlipidemia    IBS (irritable bowel syndrome)    constipation   Insomnia    Lymphadenopathy 07/12/2020   Obesity 01/14/2014   Positive test for Epstein-Barr virus (EBV) 08/21/2017   Pre-diabetes 09/2014   Superficial basal cell carcinoma (BCC) 12/26/2019   Right Breast- treatment Imiquimoid   Yeast infection 09/04/2013   Past Surgical History:  Procedure Laterality Date   BIOPSY N/A 08/01/2013   Procedure: BIOPSY;  Surgeon: Danie Binder, MD;  [facility];  Service: Endoscopy;  Laterality: N/A;  FOR MICROSCOPIC COLITIS   BIOPSY  08/09/2018   Procedure: BIOPSY;  Surgeon: Rogene Houston, MD;  [facility];  Service: Endoscopy;;  gastric   BREAST REDUCTION SURGERY  2009   BREAST SURGERY  2007   breast reduction   CHOLECYSTECTOMY N/A 11/04/2014   Procedure: LAPAROSCOPIC CHOLECYSTECTOMY;  Surgeon: Aviva Signs Md, MD;  [facility]: [facility];  Service: General;  Laterality: N/A;   COLONOSCOPY  Oct 2009   Dr. Carlean Purl: int/ext hemorrhoids, ?mild proctitis but path benign, likely r/t irritation from bowel prep   COLONOSCOPY N/A 08/01/2013   Procedure: COLONOSCOPY;  Surgeon: Danie Binder, MD;  [facility];  Service:  Endoscopy;  Laterality: N/A;  12:00   COLONOSCOPY WITH PROPOFOL N/A 08/09/2018   Procedure: COLONOSCOPY WITH PROPOFOL;  Surgeon: Rogene Houston, MD;  [facility];  Service: Endoscopy;  Laterality: N/A;  730   ESOPHAGOGASTRODUODENOSCOPY  Dec 2009   Dr. Carlean Purl: reflux esophagitis, proximal gastric polyps, benign   ESOPHAGOGASTRODUODENOSCOPY N/A 12/18/2014   Procedure: ESOPHAGOGASTRODUODENOSCOPY (EGD);  Surgeon: Rogene Houston, MD;  [facility];  Service: Endoscopy;  Laterality: N/A;  230   ESOPHAGOGASTRODUODENOSCOPY (EGD) WITH PROPOFOL N/A 08/09/2018   Procedure: ESOPHAGOGASTRODUODENOSCOPY (EGD) WITH PROPOFOL;  Surgeon: Rogene Houston, MD;  [facility];  Service: Endoscopy;  Laterality: N/A;   HEMORRHOID BANDING N/A 08/01/2013   Procedure: HEMORRHOID BANDING;  Surgeon: Danie Binder, MD;  [facility];  Service: Endoscopy;  Laterality: N/A;  internal hemorrhoid banding   LIVER BIOPSY N/A 11/04/2014   Procedure: LIVER BIOPSY;  Surgeon: Aviva Signs Md, MD;  [facility];  Service: General;  Laterality: N/A;   POLYPECTOMY  2015   Dr.Fields   POLYPECTOMY  08/09/2018   Procedure: POLYPECTOMY;  Surgeon: Rogene Houston, MD;  [facility];  Service: Endoscopy;;  colon   REDUCTION MAMMAPLASTY Bilateral 2006   UPPER GASTROINTESTINAL ENDOSCOPY  2006   Dr. Nita Sickle in [place]     Current Meds  Medication Sig   ALPRAZolam (XANAX XR) 1 MG 24 hr tablet Take 1 tablet (1 mg total) by mouth daily.   cycloSPORINE (RESTASIS) 0.05 % ophthalmic emulsion Place 1 drop into both eyes 2 (two) times daily.   dicyclomine (BENTYL) 20 MG tablet Take 20 mg by mouth daily as needed for spasms.   DULoxetine (CYMBALTA) 30 MG capsule Take 1 capsule (30 mg total) by mouth daily.   EPINEPHrine (EPIPEN 2-PAK) 0.3 mg/0.3 mL IJ SOAJ injection Inject 0.3 mLs (0.3 mg total) into the muscle as needed for anaphylaxis.   fluticasone (FLONASE) 50 MCG/ACT nasal spray Place 1 spray into both nostrils 2 (two) times daily as needed for allergies or rhinitis   hydrochlorothiazide (MICROZIDE) 12.5 MG capsule Take 1 capsule (12.5 mg total) by mouth daily.   hydrOXYzine (VISTARIL) 25 MG capsule Take 1 capsule (25 mg total) by mouth at bedtime as needed (Insomnia).   ipratropium (ATROVENT) 0.06 % nasal spray Place 2 sprays into both nostrils 3 (three) times daily.   lansoprazole (PREVACID) 30 MG capsule Take 1 capsule (30 mg  total) by mouth daily at 12 noon.   levocetirizine (XYZAL) 5 MG tablet Take 1 tablet (5 mg total) by mouth every evening.   levonorgestrel (MIRENA) 20 MCG/24HR IUD 1 each by Intrauterine route once.   podofilox (CONDYLOX) 0.5 % external solution APPLY BY TOPICAL ROUTE 2 TIMES PER DAY FOR 3 DAYS THEN STOP FOR 4 DAYS. (REPEAT 7DAY CYCLE UNTIL NO VISIBLE WART TISSUE/MAX OF FOUR CYCLES)   senna (SENOKOT) 8.6 MG tablet Take 1 tablet by mouth daily.   valACYclovir (VALTREX) 1000 MG tablet Take 1,000 mg by mouth daily.     Allergies:   Heparin, Heparin (porcine), Ativan [lorazepam], and Phentermine   ROS:   Please see the history of present illness.     All other systems reviewed and are negative.   Labs/Other Tests and Data Reviewed:    Recent Labs: 07/12/2020: ALT 21; BUN 9; Creatinine, Ser 0.92; Platelets 312; Potassium 4.1; Sodium 138; TSH 2.320 09/30/2020: Hemoglobin 13.5   Recent Lipid Panel Lab Results  Component Value Date/Time  CHOL 219 (H) 06/21/2017 08:10 AM   TRIG 118 06/21/2017 08:10 AM   HDL 44 06/21/2017 08:10 AM   CHOLHDL 5.0 (H) 06/21/2017 08:10 AM   LDLCALC 151 (H) 06/21/2017 08:10 AM    Wt Readings from Last 3 Encounters:  05/20/21 207 lb 1.9 oz (93.9 kg)  04/01/21 210 lb 9.6 oz (95.5 kg)  07/29/20 196 lb (88.9 kg)     ASSESSMENT & PLAN:    Generalized anxiety disorder Was on Xanax 0.5 mg daily Was on higher doses in the past, but has been having severe anxiety and affecting her work since decreasing Xanax dose recently Had increased the dose to 1 mg, Xanax XR form for now Had switched to Cymbalta for benefit with fibromyalgia Vistaril as needed for insomnia  Fibromyalgia Had Rheumatology evaluation in 2019, was told of fibromyalgia, but did not start any treatment at that time Recently started Cymbalta If persistent symptoms or insomnia, may switch to Elavil   Time:   Today, I have spent 15 minutes reviewing the chart, including problem list,  medications, and with the patient with telehealth technology discussing the above problems.   Medication Adjustments/Labs and Tests Ordered: Current medicines are reviewed at length with the patient today.  Concerns regarding medicines are outlined above.   Tests Ordered: No orders of the defined types were placed in this encounter.   Medication Changes: Meds ordered this encounter  Medications   hydrOXYzine (VISTARIL) 25 MG capsule    Sig: Take 1 capsule (25 mg total) by mouth at bedtime as needed (Insomnia).    Dispense:  30 capsule    Refill:  0     Note: This dictation was prepared with Dragon dictation along with smaller phrase technology. Similar sounding words can be transcribed inadequately or may not be corrected upon review. Any transcriptional errors that result from this process are unintentional.      Disposition:  Follow up  Signed, Lindell Spar, MD  06/08/2021 9:23 AM     [facility]

## 2021-06-09 NOTE — Progress Notes (Deleted)
Office Visit Note  Patient: Theresa [name]             Date of Birth: 06-06-1971           MRN: [id number]             PCP: Lindell Spar, MD Referring: Mikey Kirschner, MD Visit Date: 06/14/2021 Occupation: @GUAROCC @  Subjective:  No chief complaint on file.   History of Present Illness: Theresa [name] is a 50 y.o. female with a history of polyarthralgias myalgias and fatigue.  She returns after her initial televisit on July 29, 2019.***   Activities of Daily Living:  Patient reports morning stiffness for *** {minute/hour:19697}.   Patient {ACTIONS;DENIES/REPORTS:21021675::"Denies"} nocturnal pain.  Difficulty dressing/grooming: {ACTIONS;DENIES/REPORTS:21021675::"Denies"} Difficulty climbing stairs: {ACTIONS;DENIES/REPORTS:21021675::"Denies"} Difficulty getting out of chair: {ACTIONS;DENIES/REPORTS:21021675::"Denies"} Difficulty using hands for taps, buttons, cutlery, and/or writing: {ACTIONS;DENIES/REPORTS:21021675::"Denies"}  No Rheumatology ROS completed.   PMFS History:  Patient Active Problem List   Diagnosis Date Noted   Fibromyalgia 05/20/2021   Encounter to establish care 05/20/2021   Essential hypertension 05/20/2021   Prediabetes 08/25/2019   Separation of right acromioclavicular joint 02/17/2019   Idiopathic anaphylaxis 02/12/2019   Chronic rhinitis 02/12/2019   Chronic idiopathic urticaria 02/12/2019   Hx of colonic polyps 07/17/2018   Anxiety and depression 07/28/2017   Fatigue 07/12/2015   Arthralgia 07/12/2015   Obesity 01/14/2014   History of abnormal cervical Pap smear 12/02/2013   Internal hemorrhoids with other complication 67/34/1937   Generalized anxiety disorder 12/25/2012   GERD (gastroesophageal reflux disease) 10/31/2011   Constipation 05/13/2008    Past Medical History:  Diagnosis Date   Allergy    Allergy to alpha-gal    Anxiety    Colon adenoma DEC 2014   ONE(7 MM) SIMPLE([place])   Concussion with loss of consciousness 02/17/2019    Depression    Edema    GERD (gastroesophageal reflux disease)    History of abnormal cervical Pap smear 12/02/2013   History of nephrolithiasis    HPV (human papilloma virus) infection    Hyperlipidemia    IBS (irritable bowel syndrome)    constipation   Insomnia    Lymphadenopathy 07/12/2020   Obesity 01/14/2014   Positive test for Epstein-Barr virus (EBV) 08/21/2017   Pre-diabetes 09/2014   Superficial basal cell carcinoma (BCC) 12/26/2019   Right Breast- treatment Imiquimoid   Yeast infection 09/04/2013    Family History  Problem Relation Age of Onset   Cancer Mother        head and neck   Hypertension Father    Hyperlipidemia Father    Pancreatic cancer Maternal Grandfather    Colon cancer Neg Hx    Past Surgical History:  Procedure Laterality Date   BIOPSY N/A 08/01/2013   Procedure: BIOPSY;  Surgeon: Danie Binder, MD;  [facility];  Service: Endoscopy;  Laterality: N/A;  FOR MICROSCOPIC COLITIS   BIOPSY  08/09/2018   Procedure: BIOPSY;  Surgeon: Rogene Houston, MD;  [facility];  Service: Endoscopy;;  gastric   BREAST REDUCTION SURGERY  2009   BREAST SURGERY  2007   breast reduction   CHOLECYSTECTOMY N/A 11/04/2014   Procedure: LAPAROSCOPIC CHOLECYSTECTOMY;  Surgeon: Aviva Signs Md, MD;  Location: [facility];  Service: General;  Laterality: N/A;   COLONOSCOPY  Oct 2009   Dr. Carlean Purl: int/ext hemorrhoids, ?mild proctitis but path benign, likely r/t irritation from bowel prep   COLONOSCOPY N/A 08/01/2013   Procedure: COLONOSCOPY;  Surgeon: Marga Melnick  Fields, MD;  [facility];  Service: Endoscopy;  Laterality: N/A;  12:00   COLONOSCOPY WITH PROPOFOL N/A 08/09/2018   Procedure: COLONOSCOPY WITH PROPOFOL;  Surgeon: Rogene Houston, MD;  [facility];  Service: Endoscopy;  Laterality: N/A;  730   ESOPHAGOGASTRODUODENOSCOPY  Dec 2009   Dr. Carlean Purl: reflux esophagitis, proximal gastric polyps, benign   ESOPHAGOGASTRODUODENOSCOPY N/A  12/18/2014   Procedure: ESOPHAGOGASTRODUODENOSCOPY (EGD);  Surgeon: Rogene Houston, MD;  [facility];  Service: Endoscopy;  Laterality: N/A;  230   ESOPHAGOGASTRODUODENOSCOPY (EGD) WITH PROPOFOL N/A 08/09/2018   Procedure: ESOPHAGOGASTRODUODENOSCOPY (EGD) WITH PROPOFOL;  Surgeon: Rogene Houston, MD;  [facility];  Service: Endoscopy;  Laterality: N/A;   HEMORRHOID BANDING N/A 08/01/2013   Procedure: HEMORRHOID BANDING;  Surgeon: Danie Binder, MD;  [facility];  Service: Endoscopy;  Laterality: N/A;  internal hemorrhoid banding   LIVER BIOPSY N/A 11/04/2014   Procedure: LIVER BIOPSY;  Surgeon: Aviva Signs Md, MD;  [facility];  Service: General;  Laterality: N/A;   POLYPECTOMY  2015   Dr.Fields   POLYPECTOMY  08/09/2018   Procedure: POLYPECTOMY;  Surgeon: Rogene Houston, MD;  [facility];  Service: Endoscopy;;  colon   REDUCTION MAMMAPLASTY Bilateral 2006   UPPER GASTROINTESTINAL ENDOSCOPY  2006   Dr. Nita Sickle in [place] History Narrative   Not on file   Immunization History  Administered Date(s) Administered   Influenza, Seasonal, Injecte, Preservative Fre 05/11/2016   Influenza-Unspecified 05/02/2018, 05/16/2019, 05/17/2020   Moderna Sars-Covid-2 Vaccination 04/18/2020, 05/14/2020   Td 05/11/2016     Objective: Vital Signs: There were no vitals taken for this visit.   Physical Exam   Musculoskeletal Exam: ***  CDAI Exam: CDAI Score: -- Patient Global: --; Provider Global: -- Swollen: --; Tender: -- Joint Exam 06/14/2021   No joint exam has been documented for this visit   There is currently no information documented on the homunculus. Go to the Rheumatology activity and complete the homunculus joint exam.  Investigation: No additional findings.  Imaging: US BREAST LTD UNI LEFT INC AXILLA  Result Date: 05/27/2021 CLINICAL DATA:  50 year old female with LEFT axillary thickening  discovered on self-examination. EXAM: DIGITAL DIAGNOSTIC UNILATERAL LEFT MAMMOGRAM WITH TOMOSYNTHESIS AND CAD; ULTRASOUND LEFT BREAST LIMITED TECHNIQUE: Left digital diagnostic mammography and breast tomosynthesis was performed. The images were evaluated with computer-aided detection.; Targeted ultrasound examination of the left breast was performed. COMPARISON:  Previous exam(s). ACR Breast Density Category b: There are scattered areas of fibroglandular density. FINDINGS: 2D/3D full field and spot compression views of the LEFT breast demonstrate no suspicious mass, nonsurgical distortion or worrisome calcifications. Accessory breast tissue in the LEFT axilla is again noted. On physical exam, mild thickening is noted in the LOWER LEFT axilla. Targeted ultrasound is performed, showing no suspicious mass, distortion, worrisome shadowing or abnormal lymph nodes within the LEFT axilla. Normal accessory fibroglandular breast tissue within the LEFT axilla is again noted. IMPRESSION: 1. No evidence of breast malignancy or abnormal LEFT axillary lymph nodes. RECOMMENDATION: Consider clinical follow-up as indicated. Any further workup should be based on clinical grounds. Bilateral screening mammogram in 6 months to resume annual mammogram schedule. I have discussed the findings and recommendations with the patient. If applicable, a reminder letter will be sent to the patient regarding the next appointment. BI-RADS CATEGORY  2: Benign. Electronically Signed   By: Cleatis Polka.D.  On: 05/27/2021 15:07  MM DIAG BREAST TOMO UNI LEFT  Result Date: 05/27/2021 CLINICAL DATA:  50 year old female with LEFT axillary thickening discovered on self-examination. EXAM: DIGITAL DIAGNOSTIC UNILATERAL LEFT MAMMOGRAM WITH TOMOSYNTHESIS AND CAD; ULTRASOUND LEFT BREAST LIMITED TECHNIQUE: Left digital diagnostic mammography and breast tomosynthesis was performed. The images were evaluated with computer-aided detection.; Targeted ultrasound  examination of the left breast was performed. COMPARISON:  Previous exam(s). ACR Breast Density Category b: There are scattered areas of fibroglandular density. FINDINGS: 2D/3D full field and spot compression views of the LEFT breast demonstrate no suspicious mass, nonsurgical distortion or worrisome calcifications. Accessory breast tissue in the LEFT axilla is again noted. On physical exam, mild thickening is noted in the LOWER LEFT axilla. Targeted ultrasound is performed, showing no suspicious mass, distortion, worrisome shadowing or abnormal lymph nodes within the LEFT axilla. Normal accessory fibroglandular breast tissue within the LEFT axilla is again noted. IMPRESSION: 1. No evidence of breast malignancy or abnormal LEFT axillary lymph nodes. RECOMMENDATION: Consider clinical follow-up as indicated. Any further workup should be based on clinical grounds. Bilateral screening mammogram in 6 months to resume annual mammogram schedule. I have discussed the findings and recommendations with the patient. If applicable, a reminder letter will be sent to the patient regarding the next appointment. BI-RADS CATEGORY  2: Benign. Electronically Signed   By: Margarette Canada M.D.   On: 05/27/2021 15:07   Recent Labs: Lab Results  Component Value Date   WBC 9.4 09/30/2020   HGB 13.5 09/30/2020   PLT 312 07/12/2020   NA 138 07/12/2020   K 4.1 07/12/2020   CL 99 07/12/2020   CO2 24 07/12/2020   GLUCOSE 90 07/12/2020   BUN 9 07/12/2020   CREATININE 0.92 07/12/2020   BILITOT 0.7 07/12/2020   ALKPHOS 88 07/12/2020   AST 14 07/12/2020   ALT 21 07/12/2020   PROT 7.1 07/12/2020   ALBUMIN 4.4 07/12/2020   CALCIUM 9.1 07/12/2020   GFRAA 85 07/12/2020   February 17, 2019 RF negative, ANA negative July 12, 2020 TSH normal, CK normal September 30, 2020 immunoglobulins normal   Speciality Comments: No specialty comments available.  Procedures:  No procedures performed Allergies: Heparin, Heparin (porcine),  Ativan [lorazepam], and Phentermine   Assessment / Plan:     Visit Diagnoses: No diagnosis found.  Orders: No orders of the defined types were placed in this encounter.  No orders of the defined types were placed in this encounter.   Face-to-face time spent with patient was *** minutes. Greater than 50% of time was spent in counseling and coordination of care.  Follow-Up Instructions: No follow-ups on file.   Bo Merino, MD  Note - This record has been created using Editor, commissioning.  Chart creation errors have been sought, but may not always  have been located. Such creation errors do not reflect on  the standard of medical care.

## 2021-06-10 ENCOUNTER — Ambulatory Visit (INDEPENDENT_AMBULATORY_CARE_PROVIDER_SITE_OTHER): Payer: No Typology Code available for payment source

## 2021-06-10 DIAGNOSIS — J309 Allergic rhinitis, unspecified: Secondary | ICD-10-CM | POA: Diagnosis not present

## 2021-06-14 ENCOUNTER — Ambulatory Visit: Payer: No Typology Code available for payment source | Admitting: Rheumatology

## 2021-06-14 ENCOUNTER — Other Ambulatory Visit: Payer: Self-pay | Admitting: Internal Medicine

## 2021-06-14 ENCOUNTER — Other Ambulatory Visit (HOSPITAL_COMMUNITY): Payer: Self-pay

## 2021-06-14 DIAGNOSIS — J310 Chronic rhinitis: Secondary | ICD-10-CM

## 2021-06-14 DIAGNOSIS — M2550 Pain in unspecified joint: Secondary | ICD-10-CM

## 2021-06-14 DIAGNOSIS — Z8719 Personal history of other diseases of the digestive system: Secondary | ICD-10-CM

## 2021-06-14 DIAGNOSIS — F5101 Primary insomnia: Secondary | ICD-10-CM

## 2021-06-14 DIAGNOSIS — R5383 Other fatigue: Secondary | ICD-10-CM

## 2021-06-14 DIAGNOSIS — F411 Generalized anxiety disorder: Secondary | ICD-10-CM

## 2021-06-14 DIAGNOSIS — B2700 Gammaherpesviral mononucleosis without complication: Secondary | ICD-10-CM

## 2021-06-14 DIAGNOSIS — M3500 Sicca syndrome, unspecified: Secondary | ICD-10-CM

## 2021-06-14 DIAGNOSIS — S43101D Unspecified dislocation of right acromioclavicular joint, subsequent encounter: Secondary | ICD-10-CM

## 2021-06-14 DIAGNOSIS — Z86010 Personal history of colonic polyps: Secondary | ICD-10-CM

## 2021-06-14 DIAGNOSIS — L501 Idiopathic urticaria: Secondary | ICD-10-CM

## 2021-06-14 DIAGNOSIS — F419 Anxiety disorder, unspecified: Secondary | ICD-10-CM

## 2021-06-14 DIAGNOSIS — M797 Fibromyalgia: Secondary | ICD-10-CM

## 2021-06-14 DIAGNOSIS — R21 Rash and other nonspecific skin eruption: Secondary | ICD-10-CM

## 2021-06-14 MED ORDER — ALPRAZOLAM ER 1 MG PO TB24
1.0000 mg | ORAL_TABLET | Freq: Every day | ORAL | 2 refills | Status: DC
  Filled 2021-06-14 – 2021-06-16 (×2): qty 30, 30d supply, fill #0

## 2021-06-15 ENCOUNTER — Encounter: Payer: Self-pay | Admitting: Internal Medicine

## 2021-06-15 ENCOUNTER — Other Ambulatory Visit: Payer: Self-pay | Admitting: *Deleted

## 2021-06-15 ENCOUNTER — Other Ambulatory Visit (HOSPITAL_COMMUNITY): Payer: Self-pay

## 2021-06-15 DIAGNOSIS — F419 Anxiety disorder, unspecified: Secondary | ICD-10-CM

## 2021-06-15 MED ORDER — CITALOPRAM HYDROBROMIDE 40 MG PO TABS
40.0000 mg | ORAL_TABLET | Freq: Every day | ORAL | 0 refills | Status: DC
  Filled 2021-06-15: qty 30, 30d supply, fill #0

## 2021-06-16 ENCOUNTER — Other Ambulatory Visit (HOSPITAL_COMMUNITY): Payer: Self-pay

## 2021-06-17 ENCOUNTER — Encounter: Payer: Self-pay | Admitting: Internal Medicine

## 2021-06-17 ENCOUNTER — Other Ambulatory Visit: Payer: Self-pay | Admitting: Internal Medicine

## 2021-06-17 ENCOUNTER — Other Ambulatory Visit (HOSPITAL_COMMUNITY): Payer: Self-pay

## 2021-06-17 DIAGNOSIS — F411 Generalized anxiety disorder: Secondary | ICD-10-CM

## 2021-06-17 MED ORDER — ALPRAZOLAM 1 MG PO TABS
1.0000 mg | ORAL_TABLET | Freq: Every evening | ORAL | 1 refills | Status: DC | PRN
  Filled 2021-06-17: qty 30, 30d supply, fill #0
  Filled 2021-07-14: qty 30, 30d supply, fill #1

## 2021-06-21 ENCOUNTER — Ambulatory Visit (INDEPENDENT_AMBULATORY_CARE_PROVIDER_SITE_OTHER): Payer: No Typology Code available for payment source | Admitting: Internal Medicine

## 2021-06-21 ENCOUNTER — Encounter: Payer: Self-pay | Admitting: Internal Medicine

## 2021-06-21 ENCOUNTER — Other Ambulatory Visit: Payer: Self-pay

## 2021-06-21 ENCOUNTER — Ambulatory Visit (INDEPENDENT_AMBULATORY_CARE_PROVIDER_SITE_OTHER): Payer: No Typology Code available for payment source | Admitting: Allergy & Immunology

## 2021-06-21 ENCOUNTER — Ambulatory Visit (INDEPENDENT_AMBULATORY_CARE_PROVIDER_SITE_OTHER): Payer: No Typology Code available for payment source

## 2021-06-21 ENCOUNTER — Telehealth: Payer: Self-pay | Admitting: Internal Medicine

## 2021-06-21 DIAGNOSIS — R5383 Other fatigue: Secondary | ICD-10-CM | POA: Diagnosis not present

## 2021-06-21 DIAGNOSIS — J4520 Mild intermittent asthma, uncomplicated: Secondary | ICD-10-CM | POA: Diagnosis not present

## 2021-06-21 DIAGNOSIS — J3089 Other allergic rhinitis: Secondary | ICD-10-CM | POA: Diagnosis not present

## 2021-06-21 DIAGNOSIS — B999 Unspecified infectious disease: Secondary | ICD-10-CM

## 2021-06-21 DIAGNOSIS — J302 Other seasonal allergic rhinitis: Secondary | ICD-10-CM

## 2021-06-21 DIAGNOSIS — T782XXD Anaphylactic shock, unspecified, subsequent encounter: Secondary | ICD-10-CM

## 2021-06-21 DIAGNOSIS — J029 Acute pharyngitis, unspecified: Secondary | ICD-10-CM

## 2021-06-21 DIAGNOSIS — Z20822 Contact with and (suspected) exposure to covid-19: Secondary | ICD-10-CM | POA: Diagnosis not present

## 2021-06-21 LAB — POCT INFLUENZA A/B
Influenza A, POC: NEGATIVE
Influenza B, POC: NEGATIVE

## 2021-06-21 LAB — POCT RAPID STREP A (OFFICE): Rapid Strep A Screen: NEGATIVE

## 2021-06-21 MED ORDER — AZITHROMYCIN 250 MG PO TABS: ORAL_TABLET | ORAL | 0 refills | Status: AC

## 2021-06-21 NOTE — Telephone Encounter (Signed)
Pt called for test results

## 2021-06-21 NOTE — Progress Notes (Addendum)
Virtual Visit via Telephone Note   This visit type was conducted due to national recommendations for restrictions regarding the COVID-19 Pandemic (e.g. social distancing) in an effort to limit this patient's exposure and mitigate transmission in our community.  Due to her co-morbid illnesses, this patient is at least at moderate risk for complications without adequate follow up.  This format is felt to be most appropriate for this patient at this time.  The patient did not have access to video technology/had technical difficulties with video requiring transitioning to audio format only (telephone).  All issues noted in this document were discussed and addressed.  No physical exam could be performed with this format.  Evaluation Performed:  Follow-up visit  Date:  06/21/2021   ID:  Theresa [name], DOB 10-30-1970, MRN [id number]  Patient Location: Home Provider Location: Office/Clinic  Participants: Patient Location of Patient: Home Location of Provider: Telehealth Consent was obtain for visit to be over via telehealth. I verified that I am speaking with the correct person using two identifiers.  PCP:  Lindell Spar, MD   Chief Complaint: Cough, sore throat, myalgias  History of Present Illness:    Theresa [name] is a 50 y.o. female who has a televisit for complaint of cough, sore throat, chills and myalgias since yesterday.  She denies any fever.  She has had history of strep infections in the past. 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  She has not had COVID test yet.  The patient does have symptoms concerning for COVID-19 infection (fever, chills, cough, or new shortness of breath).   Past Medical, Surgical, Social History, Allergies, and Medications have been Reviewed.  Past Medical History:  Diagnosis Date   Allergy    Allergy to alpha-gal    Anxiety    Colon adenoma DEC 2014   ONE(7 MM) SIMPLE([place])   Concussion with loss of consciousness  02/17/2019   Depression    Edema    GERD (gastroesophageal reflux disease)    History of abnormal cervical Pap smear 12/02/2013   History of nephrolithiasis    HPV (human papilloma virus) infection    Hyperlipidemia    IBS (irritable bowel syndrome)    constipation   Insomnia    Lymphadenopathy 07/12/2020   Obesity 01/14/2014   Positive test for Epstein-Barr virus (EBV) 08/21/2017   Pre-diabetes 09/2014   Superficial basal cell carcinoma (BCC) 12/26/2019   Right Breast- treatment Imiquimoid   Yeast infection 09/04/2013   Past Surgical History:  Procedure Laterality Date   BIOPSY N/A 08/01/2013   Procedure: BIOPSY;  Surgeon: Danie Binder, MD;  [facility];  Service: Endoscopy;  Laterality: N/A;  FOR MICROSCOPIC COLITIS   BIOPSY  08/09/2018   Procedure: BIOPSY;  Surgeon: Rogene Houston, MD;  [facility];  Service: Endoscopy;;  gastric   BREAST REDUCTION SURGERY  2009   BREAST SURGERY  2007   breast reduction   CHOLECYSTECTOMY N/A 11/04/2014   Procedure: LAPAROSCOPIC CHOLECYSTECTOMY;  Surgeon: Aviva Signs Md, MD;  [facility];  Service: General;  Laterality: N/A;   COLONOSCOPY  Oct 2009   Dr. Carlean Purl: int/ext hemorrhoids, ?mild proctitis but path benign, likely r/t irritation from bowel prep   COLONOSCOPY N/A 08/01/2013   Procedure: COLONOSCOPY;  Surgeon: Danie Binder, MD;  [facility];  Service: Endoscopy;  Laterality: N/A;  12:00   COLONOSCOPY WITH PROPOFOL N/A 08/09/2018   Procedure:  COLONOSCOPY WITH PROPOFOL;  Surgeon: Rogene Houston, MD;  [facility];  Service: Endoscopy;  Laterality: N/A;  730   ESOPHAGOGASTRODUODENOSCOPY  Dec 2009   Dr. Carlean Purl: reflux esophagitis, proximal gastric polyps, benign   ESOPHAGOGASTRODUODENOSCOPY N/A 12/18/2014   Procedure: ESOPHAGOGASTRODUODENOSCOPY (EGD);  Surgeon: Rogene Houston, MD;  [facility];  Service: Endoscopy;  Laterality: N/A;  230   ESOPHAGOGASTRODUODENOSCOPY (EGD)  WITH PROPOFOL N/A 08/09/2018   Procedure: ESOPHAGOGASTRODUODENOSCOPY (EGD) WITH PROPOFOL;  Surgeon: Rogene Houston, MD;  [facility];  Service: Endoscopy;  Laterality: N/A;   HEMORRHOID BANDING N/A 08/01/2013   Procedure: HEMORRHOID BANDING;  Surgeon: Danie Binder, MD;  [facility];  Service: Endoscopy;  Laterality: N/A;  internal hemorrhoid banding   LIVER BIOPSY N/A 11/04/2014   Procedure: LIVER BIOPSY;  Surgeon: Aviva Signs Md, MD;  [facility];  Service: General;  Laterality: N/A;   POLYPECTOMY  2015   Dr.Fields   POLYPECTOMY  08/09/2018   Procedure: POLYPECTOMY;  Surgeon: Rogene Houston, MD;  [facility];  Service: Endoscopy;;  colon   REDUCTION MAMMAPLASTY Bilateral 2006   UPPER GASTROINTESTINAL ENDOSCOPY  2006   Dr. Nita Sickle in [place]     Current Meds  Medication Sig   ALPRAZolam (XANAX) 1 MG tablet Take 1 tablet (1 mg total) by mouth at bedtime as needed for anxiety.   citalopram (CELEXA) 40 MG tablet Take 1 tablet (40 mg total) by mouth daily.   cycloSPORINE (RESTASIS) 0.05 % ophthalmic emulsion Place 1 drop into both eyes 2 (two) times daily.   dicyclomine (BENTYL) 20 MG tablet Take 20 mg by mouth daily as needed for spasms.   DULoxetine (CYMBALTA) 30 MG capsule Take 1 capsule (30 mg total) by mouth daily.   EPINEPHrine (EPIPEN 2-PAK) 0.3 mg/0.3 mL IJ SOAJ injection Inject 0.3 mLs (0.3 mg total) into the muscle as needed for anaphylaxis.   fluticasone (FLONASE) 50 MCG/ACT nasal spray Place 1 spray into both nostrils 2 (two) times daily as needed for allergies or rhinitis   hydrochlorothiazide (MICROZIDE) 12.5 MG capsule Take 1 capsule (12.5 mg total) by mouth daily.   hydrOXYzine (VISTARIL) 25 MG capsule Take 1 capsule (25 mg total) by mouth at bedtime as needed (Insomnia).   ipratropium (ATROVENT) 0.06 % nasal spray Place 2 sprays into both nostrils 3 (three) times daily.   lansoprazole (PREVACID) 30 MG capsule Take 1 capsule  (30 mg total) by mouth daily at 12 noon.   levocetirizine (XYZAL) 5 MG tablet Take 1 tablet (5 mg total) by mouth every evening.   levonorgestrel (MIRENA) 20 MCG/24HR IUD 1 each by Intrauterine route once.   podofilox (CONDYLOX) 0.5 % external solution APPLY BY TOPICAL ROUTE 2 TIMES PER DAY FOR 3 DAYS THEN STOP FOR 4 DAYS. (REPEAT 7DAY CYCLE UNTIL NO VISIBLE WART TISSUE/MAX OF FOUR CYCLES)   senna (SENOKOT) 8.6 MG tablet Take 1 tablet by mouth daily.   valACYclovir (VALTREX) 1000 MG tablet Take 1,000 mg by mouth daily.     Allergies:   Heparin, Heparin (porcine), Ativan [lorazepam], and Phentermine   ROS:   Please see the history of present illness.     All other systems reviewed and are negative.   Labs/Other Tests and Data Reviewed:    Recent Labs: 07/12/2020: ALT 21; BUN 9; Creatinine, Ser 0.92; Platelets 312; Potassium 4.1; Sodium 138; TSH 2.320 09/30/2020: Hemoglobin 13.5   Recent Lipid Panel Lab Results  Component Value Date/Time  CHOL 219 (H) 06/21/2017 08:10 AM   TRIG 118 06/21/2017 08:10 AM   HDL 44 06/21/2017 08:10 AM   CHOLHDL 5.0 (H) 06/21/2017 08:10 AM   LDLCALC 151 (H) 06/21/2017 08:10 AM    Wt Readings from Last 3 Encounters:  05/20/21 207 lb 1.9 oz (93.9 kg)  04/01/21 210 lb 9.6 oz (95.5 kg)  07/29/20 196 lb (88.9 kg)     ASSESSMENT & PLAN:    Acute pharyngitis URTI, suspected COVID-19 infection Check COVID RT-PCR, rapid flu and strep test Continue Mucinex or Robitussin as needed for cough Tylenol as needed for chills/myalgias Will decide antibiotic/antiviral based on testing  Time:   Today, I have spent 9 minutes reviewing the chart, including problem list, medications, and with the patient with telehealth technology discussing the above problems.   Medication Adjustments/Labs and Tests Ordered: Current medicines are reviewed at length with the patient today.  Concerns regarding medicines are outlined above.   Tests Ordered: No orders of the  defined types were placed in this encounter.   Medication Changes: No orders of the defined types were placed in this encounter.    Note: This dictation was prepared with Dragon dictation along with smaller phrase technology. Similar sounding words can be transcribed inadequately or may not be corrected upon review. Any transcriptional errors that result from this process are unintentional.      Disposition:  Follow up  Signed, Lindell Spar, MD  06/21/2021 9:05 AM     [facility]

## 2021-06-21 NOTE — Addendum Note (Signed)
Addended byIhor Dow on: 06/21/2021 11:43 AM   Modules accepted: Orders

## 2021-06-21 NOTE — Telephone Encounter (Signed)
Pt notified flu and strep negative notified dr patel sent in azithromycin to start taking 2 days if symptoms are persistent

## 2021-06-21 NOTE — Progress Notes (Signed)
RE: Theresa [name] MRN: [id number] DOB: January 30, 1971 Date of Telemedicine Visit: 06/21/2021  Referring provider: Lindell Spar, MD Primary care provider: Lindell Spar, MD  Chief Complaint: Asthma   Telemedicine Follow Up Visit via Telephone: I connected with Theresa [name] for a follow up on 06/27/21 by telephone and verified that I am speaking with the correct person using two identifiers.   I discussed the limitations, risks, security and privacy concerns of performing an evaluation and management service by telephone and the availability of in person appointments. I also discussed with the patient that there may be a patient responsible charge related to this service. The patient expressed understanding and agreed to proceed.  Patient is at home.  Provider is at the office.  Visit start time: 4:25 PM Visit end time: 4:45 PM Insurance consent/check in by: [facility] consent and medical assistant/nurse: Dr. Agnes Lawrence is a 50 y.o. female presenting today for follow up of  Chief Complaint  Patient presents with   Asthma    Theresa [name] has a history of the following: Patient Active Problem List   Diagnosis Date Noted   Fibromyalgia 05/20/2021   Encounter to establish care 05/20/2021   Essential hypertension 05/20/2021   Prediabetes 08/25/2019   Separation of right acromioclavicular joint 02/17/2019   Idiopathic anaphylaxis 02/12/2019   Chronic rhinitis 02/12/2019   Chronic idiopathic urticaria 02/12/2019   Hx of colonic polyps 07/17/2018   Anxiety and depression 07/28/2017   Fatigue 07/12/2015   Arthralgia 07/12/2015   Obesity 01/14/2014   History of abnormal cervical Pap smear 12/02/2013   Internal hemorrhoids with other complication 02/07/9484   Generalized anxiety disorder 12/25/2012   GERD (gastroesophageal reflux disease) 10/31/2011   Constipation 05/13/2008    History obtained from: chart review and patient.  Theresa [name] is a 50 y.o. female presenting for a follow  up visit.  She is well-known to the practice.  We last saw her in August 2022.  At that time, we made sure her EpiPen was up-to-date due to her history of alpha gal and idiopathic anaphylaxis.  For her rhinitis, we continue with allergy shots.  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  She continues to get them at our office.  We continue with Flonase as well as nasal Atrovent and Xyzal.  Yesterday morning, she started having tenderness. She is having a right tonsillar swelling. She has not had a fever. She has had some problem swallowing. She has felt warm but no fever. She has tried treating this with ibuprofen. She is also waiting two days and she will take azithromycin.   She had Strep testing today and that was negative. COVID was negative and influenza was negative.   She has been working in the [employer] since April 2022. 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   She reports brain fog that was happening before COVID. She has never had a sleep study. She is not depressed or sad but she mostly likes her job. She is happy outside of work.  She has seen rheumatology due to some abnormal labs in the past, nothing definitive has come from that.  She is open to seeing her again to see what might be neck steps regarding her  chronic fatigue.  She has had an endocrinology work-up in the past as well which has been unrevealing.  I do not think she went to see an endocrinologist, but rather her PCP did the work-up.  Otherwise, there have been no changes to her past medical history, surgical history, family history, or social history.  Assessment and Plan:  Theresa [name] is a 50 y.o. female with:   Idiopathic anaphylaxis - with episodes of flushing/hives    Chronic rhinitis (cat, dog, grasses, molds,  trees, ragweed, and weeds) - on allergen immunotherapy   Anaphylactic shock due to food - with a positive alpha gal panel and continued low intensity reactions (not great at avoiding exposures)   S/p chiropractor visit to treat alpha gal [facility])  Chronic fatigue. - unknown etiology, but we are going to get a sleep study today   Theresa [name] presents for a follow-up visit via the telephone.  She overall is doing fairly well from a rhinitis standpoint, but her main complaint today seems to be related to the chronic fatigue. She has been getting plenty of rest and she reports that she is not depressed at all. She finds enjoyment in her life activities and she has been doing well overall. But her fatigue has been debilitating for her. She has apparently had a workup for Lyme disease which has been negative. She has never had a sleep study, but denies snoring.  She is open to doing it anyway.  She also has been seen by Dr. Estanislado Pandy for abnormal labs, although she has no working diagnosis at this point.  She is willing to go back and see her for another opinion as well.  I think between these 2 next steps, we should have some answers.  Diagnostics: None.  Medication List:  Current Outpatient Medications  Medication Sig Dispense Refill   ALPRAZolam (XANAX) 1 MG tablet Take 1 tablet (1 mg total) by mouth at bedtime as needed for anxiety. 30 tablet 1   citalopram (CELEXA) 40 MG tablet Take 1 tablet (40 mg total) by mouth daily. 30 tablet 0   cycloSPORINE (RESTASIS) 0.05 % ophthalmic emulsion Place 1 drop into both eyes 2 (two) times daily. 180 mL 4   dicyclomine (BENTYL) 20 MG tablet Take 20 mg by mouth daily as needed for spasms.     DULoxetine (CYMBALTA) 30 MG capsule Take 1 capsule (30 mg total) by mouth daily. 30 capsule 0   EPINEPHrine (EPIPEN 2-PAK) 0.3 mg/0.3 mL IJ SOAJ injection Inject 0.3 mLs (0.3 mg total) into the muscle as needed for anaphylaxis. 1 each 0   fluticasone (FLONASE) 50  MCG/ACT nasal spray Place 1 spray into both nostrils 2 (two) times daily as needed for allergies or rhinitis 16 g 11   hydrochlorothiazide (MICROZIDE) 12.5 MG capsule Take 1 capsule (12.5 mg total) by mouth daily. 30 capsule 2   hydrOXYzine (VISTARIL) 25 MG capsule Take 1 capsule (25 mg total) by mouth at bedtime as needed (Insomnia). 30 capsule 0   ipratropium (ATROVENT) 0.06 % nasal spray Place 2 sprays into both nostrils 3 (three) times daily. 15 mL 11   lansoprazole (PREVACID) 30 MG capsule Take 1 capsule (30 mg total) by mouth daily at 12 noon. 90 capsule 3   levocetirizine (XYZAL) 5 MG tablet Take 1 tablet (5 mg total) by mouth every evening. 30 tablet 11   levonorgestrel (MIRENA) 20 MCG/24HR IUD 1 each by Intrauterine route once.     podofilox (CONDYLOX) 0.5 % external solution  APPLY BY TOPICAL ROUTE 2 TIMES PER DAY FOR 3 DAYS THEN STOP FOR 4 DAYS. (REPEAT 7DAY CYCLE UNTIL NO VISIBLE WART TISSUE/MAX OF FOUR CYCLES) 3.5 mL 2   senna (SENOKOT) 8.6 MG tablet Take 1 tablet (8.6 mg total) by mouth daily. 30 tablet 0   valACYclovir (VALTREX) 1000 MG tablet Take 1,000 mg by mouth daily.     No current facility-administered medications for this visit.   Allergies: Allergies  Allergen Reactions   Heparin    Heparin (Porcine) Other (See Comments)   Ativan [Lorazepam] Other (See Comments)    Nightmares and feelings of "withdrawal"    Phentermine Palpitations   I reviewed her past medical history, social history, family history, and environmental history and no significant changes have been reported from previous visits.  Review of Systems  Constitutional:  Positive for fatigue. Negative for activity change and appetite change.  HENT:  Positive for rhinorrhea and sore throat. Negative for congestion, postnasal drip and sinus pressure.   Eyes:  Negative for pain, discharge, redness and itching.  Respiratory:  Negative for shortness of breath, wheezing and stridor.   Gastrointestinal:  Negative  for diarrhea, nausea and vomiting.  Musculoskeletal:  Negative for arthralgias, joint swelling and myalgias.  Skin:  Negative for rash.  Allergic/Immunologic: Negative for environmental allergies and food allergies.   Objective:  Physical exam not obtained as encounter was done via telephone.   Previous notes and tests were reviewed.  I discussed the assessment and treatment plan with the patient. The patient was provided an opportunity to ask questions and all were answered. The patient agreed with the plan and demonstrated an understanding of the instructions.   The patient was advised to call back or seek an in-person evaluation if the symptoms worsen or if the condition fails to improve as anticipated.  I provided 20 minutes of non-face-to-face time during this encounter.  It was my pleasure to participate in Theresa [name]'s care today. Please feel free to contact me with any questions or concerns.   Sincerely,  Valentina Shaggy, MD

## 2021-06-22 ENCOUNTER — Ambulatory Visit (INDEPENDENT_AMBULATORY_CARE_PROVIDER_SITE_OTHER): Payer: No Typology Code available for payment source

## 2021-06-22 ENCOUNTER — Telehealth: Payer: No Typology Code available for payment source | Admitting: Internal Medicine

## 2021-06-22 DIAGNOSIS — J309 Allergic rhinitis, unspecified: Secondary | ICD-10-CM

## 2021-06-24 ENCOUNTER — Other Ambulatory Visit (HOSPITAL_COMMUNITY): Payer: Self-pay

## 2021-06-24 ENCOUNTER — Other Ambulatory Visit: Payer: Self-pay | Admitting: Internal Medicine

## 2021-06-24 MED ORDER — SENNOSIDES 8.6 MG PO TABS
1.0000 | ORAL_TABLET | Freq: Every day | ORAL | 0 refills | Status: DC
  Filled 2021-06-24: qty 30, 30d supply, fill #0

## 2021-06-27 ENCOUNTER — Telehealth: Payer: Self-pay

## 2021-06-27 ENCOUNTER — Encounter: Payer: Self-pay | Admitting: Allergy & Immunology

## 2021-06-27 NOTE — Telephone Encounter (Signed)
err

## 2021-07-01 ENCOUNTER — Ambulatory Visit (INDEPENDENT_AMBULATORY_CARE_PROVIDER_SITE_OTHER): Payer: No Typology Code available for payment source

## 2021-07-01 DIAGNOSIS — J309 Allergic rhinitis, unspecified: Secondary | ICD-10-CM

## 2021-07-14 ENCOUNTER — Other Ambulatory Visit: Payer: Self-pay

## 2021-07-14 ENCOUNTER — Other Ambulatory Visit: Payer: Self-pay | Admitting: Internal Medicine

## 2021-07-14 ENCOUNTER — Other Ambulatory Visit (HOSPITAL_COMMUNITY): Payer: Self-pay

## 2021-07-14 DIAGNOSIS — F32A Depression, unspecified: Secondary | ICD-10-CM

## 2021-07-14 DIAGNOSIS — F419 Anxiety disorder, unspecified: Secondary | ICD-10-CM

## 2021-07-14 MED ORDER — CITALOPRAM HYDROBROMIDE 40 MG PO TABS
40.0000 mg | ORAL_TABLET | Freq: Every day | ORAL | 0 refills | Status: DC
  Filled 2021-07-14: qty 30, 30d supply, fill #0

## 2021-07-15 ENCOUNTER — Ambulatory Visit (INDEPENDENT_AMBULATORY_CARE_PROVIDER_SITE_OTHER): Payer: No Typology Code available for payment source

## 2021-07-15 DIAGNOSIS — J309 Allergic rhinitis, unspecified: Secondary | ICD-10-CM

## 2021-07-19 ENCOUNTER — Encounter: Payer: Self-pay | Admitting: Internal Medicine

## 2021-07-29 ENCOUNTER — Other Ambulatory Visit: Payer: Self-pay

## 2021-07-29 ENCOUNTER — Ambulatory Visit (INDEPENDENT_AMBULATORY_CARE_PROVIDER_SITE_OTHER): Payer: No Typology Code available for payment source

## 2021-07-29 DIAGNOSIS — J309 Allergic rhinitis, unspecified: Secondary | ICD-10-CM | POA: Diagnosis not present

## 2021-07-29 MED ORDER — BEPOTASTINE BESILATE 1.5 % OP SOLN: 1.0000 [drp] | Freq: Two times a day (BID) | OPHTHALMIC | 5 refills | Status: AC | PRN

## 2021-07-29 NOTE — Telephone Encounter (Signed)
Patient came in to get her allergy injections, she stated that she is needing some allergy eye drops. She stated that she is on eye drops however they are not for allergies. Patient is wondering what if something can be called in to help her. She is not wearing her contacts due to itchy watery eyes and is in need of some relief. Please advise.

## 2021-07-29 NOTE — Telephone Encounter (Signed)
Lets try Bepreve 1 drop per eye twice a day as needed.  I pended the prescription, but I am not sure which pharmacy she wants.  Salvatore Marvel, MD [facility] [place] [facility] [place]

## 2021-07-29 NOTE — Telephone Encounter (Signed)
Left a message for patient to call the office, we will need to see what pharmacy she would like the [facility] sent to.

## 2021-08-01 ENCOUNTER — Encounter: Payer: Self-pay | Admitting: Family Medicine

## 2021-08-01 ENCOUNTER — Ambulatory Visit (INDEPENDENT_AMBULATORY_CARE_PROVIDER_SITE_OTHER): Payer: No Typology Code available for payment source | Admitting: Family Medicine

## 2021-08-01 ENCOUNTER — Other Ambulatory Visit: Payer: Self-pay

## 2021-08-01 ENCOUNTER — Encounter: Payer: Self-pay | Admitting: Allergy & Immunology

## 2021-08-01 DIAGNOSIS — K219 Gastro-esophageal reflux disease without esophagitis: Secondary | ICD-10-CM | POA: Diagnosis not present

## 2021-08-01 DIAGNOSIS — Z91018 Allergy to other foods: Secondary | ICD-10-CM | POA: Diagnosis not present

## 2021-08-01 DIAGNOSIS — T782XXD Anaphylactic shock, unspecified, subsequent encounter: Secondary | ICD-10-CM

## 2021-08-01 DIAGNOSIS — J302 Other seasonal allergic rhinitis: Secondary | ICD-10-CM

## 2021-08-01 DIAGNOSIS — H1010 Acute atopic conjunctivitis, unspecified eye: Secondary | ICD-10-CM | POA: Diagnosis not present

## 2021-08-01 DIAGNOSIS — J3089 Other allergic rhinitis: Secondary | ICD-10-CM | POA: Diagnosis not present

## 2021-08-01 MED ORDER — CROMOLYN SODIUM 4 % OP SOLN: 2.0000 [drp] | Freq: Four times a day (QID) | OPHTHALMIC | 2 refills | Status: DC

## 2021-08-01 MED ORDER — PREDNISONE 10 MG PO TABS: ORAL_TABLET | ORAL | 0 refills | Status: DC

## 2021-08-01 NOTE — Progress Notes (Signed)
RE: Theresa [name] MRN: [id number] DOB: 06-Jun-1971 Date of Telemedicine Visit: 08/01/2021  Referring provider: Lindell Spar, MD Primary care provider: Lindell Spar, MD  Chief Complaint: Sinusitis (Sinus pressure, symptoms started yesterday, using nettie pot )   Telemedicine Follow Up Visit via Telephone: I connected with Theresa [name] for a follow up on 08/01/21 by telephone and verified that I am speaking with the correct person using two identifiers.   I discussed the limitations, risks, security and privacy concerns of performing an evaluation and management service by telephone and the availability of in person appointments. I also discussed with the patient that there may be a patient responsible charge related to this service. The patient expressed understanding and agreed to proceed.  Patient is at home  Provider is at the office.  Visit start time: 34 Visit end time: [place] consent/check in by: [facility] consent and medical assistant/nurse: Ashleigh  History of Present Illness: She is a 50 y.o. female, who is being followed for allergic rhinitis, allergic conjunctivitis, alpha gal, and reflux. Her previous allergy office visit was on 06/21/2021 via televisit with Dr. Ernst Bowler.  At today's visit, she reports that on Friday she was exposed to wood stove and several environmental allergies.  At today's visit, she reports symptoms including frontal headache that began yesterday, sore throat in the morning for the last 2 days, clear thin rhinorrhea, nasal congestion, and postnasal drainage.  She reports most of the symptoms began yesterday.  She denies fever, chills, sweats, or sick contacts.  She continues Xyzal 5 mg once a day, Flonase 2 sprays in each nostril daily, and Nettie pot daily.  She uses Atrovent nasal spray as needed with relief of runny nose.  She continues allergen immunotherapy with no large or local reactions.  She reports a significant decrease in her symptoms of  allergic rhinitis while continuing on allergen immunotherapy.  Allergic conjunctivitis is reported as poorly controlled with red and itchy eyes for which a medication has recently been called to her pharmacy.  This particular medication was not covered by her insurance.  Her current medications are listed in the chart.  Assessment and Plan: Theresa [name] is a 50 y.o. female with: Patient Instructions  Allergic rhinitis Continue allergen avoidance measures directed toward pollens, pets, and molds as listed below Continue allergen immunotherapy once your symptoms of allergy flare have resolved.  Have access to an epinephrine autoinjector device while on allergen immunotherapy Begin prednisone 10 mg tablets. Take 2 tablets twice a day for 3 days, then take 2 tablets once a day for 1 day, then take 1 tablet on the 5th day, then stop  Continue Flonase 2 sprays in each nostril once a day for nasal congestion.  In the right nostril, point the applicator out toward the right ear. In the left nostril, point the applicator out toward the left ear Continue saline nasal rinses as needed for nasal symptoms. Use this before any medicated nasal sprays for best result Stop Xyzal for the next 5 days.  Then continue Xyzal 5 mg once a day as needed for runny nose or itch Stop Atrovent nasal spray for the next 5 days.  Then continue Atrovent nasal spray 2 sprays in each nostril twice a day as needed for runny nose Call the clinic if your symptoms worsen or if you develop a fever  Allergic conjunctivitis A prescription for cromolyn has been called into the pharmacy for you.  Use cromolyn 1 to 2 drops in each  eye up to 4 times a day as needed for red or itchy eyes.   Some over the counter eye drops include Pataday (olopatadine) one drop in each eye once a day as needed for red, itchy eyes OR Zaditor one drop in each eye twice a day as needed for red itchy eyes.  Alpha gal Continue to avoid mammalian meats.  In case of an  allergic reaction, take Benadryl 50 mg every 4 hours, and if life-threatening symptoms occur, inject with EpiPen 0.3 mg.  Reflux Continue dietary lifestyle modifications as listed below Continue lansoprazole as previously prescribed  Call the clinic if this treatment plan is not working well for you  Follow up in 2 months or sooner if needed.  Return in about 2 months (around 10/02/2021), or if symptoms worsen or fail to improve.  Meds ordered this encounter  Medications   predniSONE (DELTASONE) 10 MG tablet    Sig: Begin prednisone 10 mg tablets. Take 2 tablets twice a day for 3 days, then take 2 tablets once a day for 1 day, then take 1 tablet on the 5th day, then stop    Dispense:  15 tablet    Refill:  0   cromolyn (OPTICROM) 4 % ophthalmic solution    Sig: Place 2 drops into both eyes 4 (four) times daily. Place 1-2 drops in each eye up to 4 times a day as needed for red or itchy eyes    Dispense:  10 mL    Refill:  2   .  Medication List:  Current Outpatient Medications  Medication Sig Dispense Refill   ALPRAZolam (XANAX) 1 MG tablet Take 1 tablet (1 mg total) by mouth at bedtime as needed for anxiety. 30 tablet 1   Bepotastine Besilate 1.5 % SOLN Place 1 drop into the right eye 2 (two) times daily as needed. 10 mL 5   citalopram (CELEXA) 40 MG tablet Take 1 tablet (40 mg total) by mouth daily. 30 tablet 0   cromolyn (OPTICROM) 4 % ophthalmic solution Place 2 drops into both eyes 4 (four) times daily. Place 1-2 drops in each eye up to 4 times a day as needed for red or itchy eyes 10 mL 2   cycloSPORINE (RESTASIS) 0.05 % ophthalmic emulsion Place 1 drop into both eyes 2 (two) times daily. 180 mL 4   EPINEPHrine (EPIPEN 2-PAK) 0.3 mg/0.3 mL IJ SOAJ injection Inject 0.3 mLs (0.3 mg total) into the muscle as needed for anaphylaxis. 1 each 0   fluticasone (FLONASE) 50 MCG/ACT nasal spray Place 1 spray into both nostrils 2 (two) times daily as needed for allergies or rhinitis 16 g 11    ipratropium (ATROVENT) 0.06 % nasal spray Place 2 sprays into both nostrils 3 (three) times daily. 15 mL 11   lansoprazole (PREVACID) 30 MG capsule Take 1 capsule (30 mg total) by mouth daily at 12 noon. 90 capsule 3   levocetirizine (XYZAL) 5 MG tablet Take 1 tablet (5 mg total) by mouth every evening. 30 tablet 11   levonorgestrel (MIRENA) 20 MCG/24HR IUD 1 each by Intrauterine route once.     predniSONE (DELTASONE) 10 MG tablet Begin prednisone 10 mg tablets. Take 2 tablets twice a day for 3 days, then take 2 tablets once a day for 1 day, then take 1 tablet on the 5th day, then stop 15 tablet 0   senna (SENOKOT) 8.6 MG tablet Take 1 tablet (8.6 mg total) by mouth daily. 30 tablet 0  valACYclovir (VALTREX) 1000 MG tablet Take 1,000 mg by mouth daily.     No current facility-administered medications for this visit.   Allergies: Allergies  Allergen Reactions   Heparin    Heparin (Porcine) Other (See Comments)   Ativan [Lorazepam] Other (See Comments)    Nightmares and feelings of "withdrawal"    Phentermine Palpitations   I reviewed her past medical history, social history, family history, and environmental history and no significant changes have been reported from previous visit on 06/21/2021.  Review of Systems  Constitutional: Negative.  Negative for chills, diaphoresis and fever.  HENT:  Positive for congestion, sinus pressure and sore throat.   Eyes:  Positive for redness and itching.  Respiratory: Negative.    Cardiovascular: Negative.   Musculoskeletal: Negative.   Skin: Negative.   Allergic/Immunologic: Positive for environmental allergies.   Objective: Physical Exam Not obtained as encounter was done via telephone.   Previous notes and tests were reviewed.  I discussed the assessment and treatment plan with the patient. The patient was provided an opportunity to ask questions and all were answered. The patient agreed with the plan and demonstrated an understanding of  the instructions.   The patient was advised to call back or seek an in-person evaluation if the symptoms worsen or if the condition fails to improve as anticipated.  I provided 21 minutes of non-face-to-face time during this encounter.  It was my pleasure to participate in Theresa [name]'s care today. Please feel free to contact me with any questions or concerns.   Sincerely,  Gareth Morgan, FNP

## 2021-08-01 NOTE — Patient Instructions (Addendum)
Allergic rhinitis Continue allergen avoidance measures directed toward pollens, pets, and molds as listed below Continue allergen immunotherapy once your symptoms of allergy flare have resolved.  Have access to an epinephrine autoinjector device while on allergen immunotherapy Begin prednisone 10 mg tablets. Take 2 tablets twice a day for 3 days, then take 2 tablets once a day for 1 day, then take 1 tablet on the 5th day, then stop  Continue Flonase 2 sprays in each nostril once a day for nasal congestion.  In the right nostril, point the applicator out toward the right ear. In the left nostril, point the applicator out toward the left ear Continue saline nasal rinses as needed for nasal symptoms. Use this before any medicated nasal sprays for best result Begin Mucinex [id number] mg twice a day and increase fluid intake to thin out mucus for the next 5 days Stop Xyzal for the next 5 days.  Then continue Xyzal 5 mg once a day as needed for runny nose or itch Stop Atrovent nasal spray for the next 5 days.  Then continue Atrovent nasal spray 2 sprays in each nostril twice a day as needed for runny nose Call the clinic if your symptoms worsen or if you develop a fever  Allergic conjunctivitis A prescription for cromolyn has been called into the pharmacy for you.  Use cromolyn 1 to 2 drops in each eye up to 4 times a day as needed for red or itchy eyes.   Some over the counter eye drops include Pataday (olopatadine) one drop in each eye once a day as needed for red, itchy eyes OR Zaditor one drop in each eye twice a day as needed for red itchy eyes.  Alpha gal Continue to avoid mammalian meats.  In case of an allergic reaction, take Benadryl 50 mg every 4 hours, and if life-threatening symptoms occur, inject with EpiPen 0.3 mg.  Reflux Continue dietary lifestyle modifications as listed below Continue lansoprazole as previously prescribed  Call the clinic if this treatment plan is not working well for  you  Follow up in 2 months or sooner if needed.  Reducing Pollen Exposure The American Academy of Allergy, Asthma and Immunology suggests the following steps to reduce your exposure to pollen during allergy seasons. Do not hang sheets or clothing out to dry; pollen may collect on these items. Do not mow lawns or spend time around freshly cut grass; mowing stirs up pollen. Keep windows closed at night.  Keep car windows closed while driving. Minimize morning activities outdoors, a time when pollen counts are usually at their highest. Stay indoors as much as possible when pollen counts or humidity is high and on windy days when pollen tends to remain in the air longer. Use air conditioning when possible.  Many air conditioners have filters that trap the pollen spores. Use a HEPA room air filter to remove pollen form the indoor air you breathe.  Control of Dog or Cat Allergen Avoidance is the best way to manage a dog or cat allergy. If you have a dog or cat and are allergic to dog or cats, consider removing the dog or cat from the home. If you have a dog or cat but dont want to find it a new home, or if your family wants a pet even though someone in the household is allergic, here are some strategies that may help keep symptoms at bay:  Keep the pet out of your bedroom and restrict it to only a  few rooms. Be advised that keeping the dog or cat in only one room will not limit the allergens to that room. Dont pet, hug or kiss the dog or cat; if you do, wash your hands with soap and water. High-efficiency particulate air (HEPA) cleaners run continuously in a bedroom or living room can reduce allergen levels over time. Regular use of a high-efficiency vacuum cleaner or a central vacuum can reduce allergen levels. Giving your dog or cat a bath at least once a week can reduce airborne allergen.  Control of Mold Allergen Mold and fungi can grow on a variety of surfaces provided certain temperature  and moisture conditions exist.  Outdoor molds grow on plants, decaying vegetation and soil.  The major outdoor mold, Alternaria and Cladosporium, are found in very high numbers during hot and dry conditions.  Generally, a late Summer - Fall peak is seen for common outdoor fungal spores.  Rain will temporarily lower outdoor mold spore count, but counts rise rapidly when the rainy period ends.  The most important indoor molds are Aspergillus and Penicillium.  Dark, humid and poorly ventilated basements are ideal sites for mold growth.  The next most common sites of mold growth are the bathroom and the kitchen.  Outdoor [employer] Use air conditioning and keep windows closed Avoid exposure to decaying vegetation. Avoid leaf raking. Avoid grain handling. Consider wearing a face mask if working in moldy areas.  Indoor Mold Control Maintain humidity below 50%. Clean washable surfaces with 5% bleach solution. Remove sources e.g. Contaminated carpets.

## 2021-08-10 ENCOUNTER — Other Ambulatory Visit (HOSPITAL_COMMUNITY): Payer: Self-pay

## 2021-08-10 ENCOUNTER — Other Ambulatory Visit: Payer: Self-pay | Admitting: Internal Medicine

## 2021-08-10 ENCOUNTER — Other Ambulatory Visit: Payer: Self-pay

## 2021-08-10 ENCOUNTER — Ambulatory Visit (INDEPENDENT_AMBULATORY_CARE_PROVIDER_SITE_OTHER): Payer: No Typology Code available for payment source | Admitting: *Deleted

## 2021-08-10 DIAGNOSIS — J309 Allergic rhinitis, unspecified: Secondary | ICD-10-CM | POA: Diagnosis not present

## 2021-08-10 DIAGNOSIS — F411 Generalized anxiety disorder: Secondary | ICD-10-CM

## 2021-08-10 DIAGNOSIS — F32A Depression, unspecified: Secondary | ICD-10-CM

## 2021-08-10 MED ORDER — ALPRAZOLAM 1 MG PO TABS
1.0000 mg | ORAL_TABLET | Freq: Every evening | ORAL | 2 refills | Status: DC | PRN
  Filled 2021-08-10: qty 30, 30d supply, fill #0
  Filled 2021-09-06 – 2021-09-07 (×2): qty 30, 30d supply, fill #1
  Filled 2021-10-05: qty 30, 30d supply, fill #2

## 2021-08-10 MED ORDER — CITALOPRAM HYDROBROMIDE 40 MG PO TABS
40.0000 mg | ORAL_TABLET | Freq: Every day | ORAL | 1 refills | Status: DC
  Filled 2021-08-10: qty 90, 90d supply, fill #0
  Filled 2021-10-18 – 2021-10-21 (×2): qty 90, 90d supply, fill #1

## 2021-08-11 DIAGNOSIS — J3081 Allergic rhinitis due to animal (cat) (dog) hair and dander: Secondary | ICD-10-CM | POA: Diagnosis not present

## 2021-08-11 NOTE — Progress Notes (Signed)
VIALS MADE. EXP 08-11-22

## 2021-08-13 LAB — HM DIABETES EYE EXAM

## 2021-08-17 ENCOUNTER — Ambulatory Visit (INDEPENDENT_AMBULATORY_CARE_PROVIDER_SITE_OTHER): Payer: No Typology Code available for payment source

## 2021-08-17 DIAGNOSIS — J309 Allergic rhinitis, unspecified: Secondary | ICD-10-CM

## 2021-08-24 ENCOUNTER — Ambulatory Visit (INDEPENDENT_AMBULATORY_CARE_PROVIDER_SITE_OTHER): Payer: No Typology Code available for payment source

## 2021-08-24 DIAGNOSIS — J309 Allergic rhinitis, unspecified: Secondary | ICD-10-CM | POA: Diagnosis not present

## 2021-08-31 ENCOUNTER — Ambulatory Visit (INDEPENDENT_AMBULATORY_CARE_PROVIDER_SITE_OTHER): Payer: No Typology Code available for payment source

## 2021-08-31 DIAGNOSIS — J309 Allergic rhinitis, unspecified: Secondary | ICD-10-CM | POA: Diagnosis not present

## 2021-09-06 ENCOUNTER — Other Ambulatory Visit (HOSPITAL_COMMUNITY): Payer: Self-pay

## 2021-09-07 ENCOUNTER — Other Ambulatory Visit (HOSPITAL_COMMUNITY): Payer: Self-pay

## 2021-09-07 ENCOUNTER — Ambulatory Visit (INDEPENDENT_AMBULATORY_CARE_PROVIDER_SITE_OTHER): Payer: No Typology Code available for payment source

## 2021-09-07 DIAGNOSIS — J309 Allergic rhinitis, unspecified: Secondary | ICD-10-CM | POA: Diagnosis not present

## 2021-09-21 ENCOUNTER — Other Ambulatory Visit (HOSPITAL_COMMUNITY): Payer: Self-pay

## 2021-09-21 ENCOUNTER — Encounter: Payer: Self-pay | Admitting: Internal Medicine

## 2021-09-21 ENCOUNTER — Ambulatory Visit (INDEPENDENT_AMBULATORY_CARE_PROVIDER_SITE_OTHER): Payer: No Typology Code available for payment source | Admitting: Internal Medicine

## 2021-09-21 ENCOUNTER — Other Ambulatory Visit: Payer: Self-pay

## 2021-09-21 VITALS — BP 122/78 | HR 104 | Resp 18 | Ht 66.0 in | Wt 210.1 lb

## 2021-09-21 DIAGNOSIS — I10 Essential (primary) hypertension: Secondary | ICD-10-CM | POA: Diagnosis not present

## 2021-09-21 DIAGNOSIS — R7303 Prediabetes: Secondary | ICD-10-CM

## 2021-09-21 DIAGNOSIS — E669 Obesity, unspecified: Secondary | ICD-10-CM

## 2021-09-21 DIAGNOSIS — F411 Generalized anxiety disorder: Secondary | ICD-10-CM

## 2021-09-21 DIAGNOSIS — M7989 Other specified soft tissue disorders: Secondary | ICD-10-CM | POA: Diagnosis not present

## 2021-09-21 DIAGNOSIS — K219 Gastro-esophageal reflux disease without esophagitis: Secondary | ICD-10-CM | POA: Diagnosis not present

## 2021-09-21 DIAGNOSIS — M797 Fibromyalgia: Secondary | ICD-10-CM

## 2021-09-21 MED ORDER — HYDROCHLOROTHIAZIDE 12.5 MG PO CAPS
12.5000 mg | ORAL_CAPSULE | Freq: Every day | ORAL | 2 refills | Status: DC | PRN
  Filled 2021-09-21: qty 30, 30d supply, fill #0

## 2021-09-21 NOTE — Progress Notes (Signed)
Established Patient Office Visit  Subjective:  Patient ID: Theresa [name], female    DOB: 1971-04-24  Age: 51 y.o. MRN: [id number]  CC:  Chief Complaint  Patient presents with   Follow-up    4 month follow up pt never heard from rheumatology even tho allergist put in referral for diff reason to diff provider pt has stopped taking HCTZ and would like this removed from chart as she does not have HTN     HPI Theresa [name] is a 51 y.o. female with past medical history of GERD, chronic rhinitis, GAD, fibromyalgia and prediabetes who presents for f/u of her chronic medical conditions.  She has been taking Celexa and Xanax as needed for anxiety.  Her sleep has improved now.  She still complains of severe fatigue and moderate muscle aches. She was diagnosed with fibromyalgia in 2019, but was not started on any medication at that time.  She was referred to rheumatology clinic, but referral was denied as they do not manage fibromyalgia.  She states that her symptoms are manageable currently.  Her BP is well controlled without HCTZ now.  She agrees to take it as needed for leg swelling.  Denies any chest pain, dyspnea or palpitations.  She is worried about her weight.  She agrees to follow low-carb diet and perform regular exercises for now.   Past Medical History:  Diagnosis Date   Allergy    Allergy to alpha-gal    Anxiety    Colon adenoma DEC 2014   ONE(7 MM) SIMPLE([place])   Concussion with loss of consciousness 02/17/2019   Depression    Edema    GERD (gastroesophageal reflux disease)    History of abnormal cervical Pap smear 12/02/2013   History of nephrolithiasis    HPV (human papilloma virus) infection    Hyperlipidemia    IBS (irritable bowel syndrome)    constipation   Insomnia    Lymphadenopathy 07/12/2020   Obesity 01/14/2014   Positive test for Epstein-Barr virus (EBV) 08/21/2017   Pre-diabetes 09/2014   Superficial basal cell carcinoma (BCC) 12/26/2019   Right Breast- treatment  Imiquimoid   Yeast infection 09/04/2013    Past Surgical History:  Procedure Laterality Date   BIOPSY N/A 08/01/2013   Procedure: BIOPSY;  Surgeon: Danie Binder, MD;  [facility]: [facility];  Service: Endoscopy;  Laterality: N/A;  FOR MICROSCOPIC COLITIS   BIOPSY  08/09/2018   Procedure: BIOPSY;  Surgeon: Rogene Houston, MD;  Location: [facility];  Service: Endoscopy;;  gastric   BREAST REDUCTION SURGERY  2009   BREAST SURGERY  2007   breast reduction   CHOLECYSTECTOMY N/A 11/04/2014   Procedure: LAPAROSCOPIC CHOLECYSTECTOMY;  Surgeon: Aviva Signs Md, MD;  Location: [facility];  Service: General;  Laterality: N/A;   COLONOSCOPY  Oct 2009   Dr. Carlean Purl: int/ext hemorrhoids, ?mild proctitis but path benign, likely r/t irritation from bowel prep   COLONOSCOPY N/A 08/01/2013   Procedure: COLONOSCOPY;  Surgeon: Danie Binder, MD;  [facility]: [facility];  Service: Endoscopy;  Laterality: N/A;  12:00   COLONOSCOPY WITH PROPOFOL N/A 08/09/2018   Procedure: COLONOSCOPY WITH PROPOFOL;  Surgeon: Rogene Houston, MD;  [facility]: [facility];  Service: Endoscopy;  Laterality: N/A;  730   ESOPHAGOGASTRODUODENOSCOPY  Dec 2009   Dr. Carlean Purl: reflux esophagitis, proximal gastric polyps, benign   ESOPHAGOGASTRODUODENOSCOPY N/A 12/18/2014   Procedure: ESOPHAGOGASTRODUODENOSCOPY (EGD);  Surgeon: Rogene Houston, MD;  [facility];  Service: Endoscopy;  Laterality: N/A;  230   ESOPHAGOGASTRODUODENOSCOPY (EGD) WITH PROPOFOL N/A 08/09/2018   Procedure: ESOPHAGOGASTRODUODENOSCOPY (EGD) WITH PROPOFOL;  Surgeon: Rogene Houston, MD;  [facility];  Service: Endoscopy;  Laterality: N/A;   HEMORRHOID BANDING N/A 08/01/2013   Procedure: HEMORRHOID BANDING;  Surgeon: Danie Binder, MD;  [facility];  Service: Endoscopy;  Laterality: N/A;  internal hemorrhoid banding   LIVER BIOPSY N/A 11/04/2014   Procedure: LIVER BIOPSY;  Surgeon: Aviva Signs Md, MD;  Location: [facility];   Service: General;  Laterality: N/A;   POLYPECTOMY  2015   Dr.Fields   POLYPECTOMY  08/09/2018   Procedure: POLYPECTOMY;  Surgeon: Rogene Houston, MD;  [facility];  Service: Endoscopy;;  colon   REDUCTION MAMMAPLASTY Bilateral 2006   UPPER GASTROINTESTINAL ENDOSCOPY  2006   Dr. Nita Sickle in [place]    Family History  Problem Relation Age of Onset   Cancer Mother        head and neck   Hypertension Father    Hyperlipidemia Father    Pancreatic cancer Maternal Grandfather    Colon cancer Neg Hx     Social History   Socioeconomic History   Marital status: Married    Spouse name: Not on file   Number of children: 1   Years of education: Not on file   Highest education level: Not on file  Occupational History   Occupation: Therapist, sports    Employer: [employer]  Tobacco Use   Smoking status: Never   Smokeless tobacco: Never   Tobacco comments:    Never smoker  Vaping Use   Vaping Use: Never used  Substance and Sexual Activity   Alcohol use: Yes    Alcohol/week: 0.0 standard drinks    Comment: socially   Drug use: No   Sexual activity: Yes    Birth control/protection: I.U.D.  Other Topics Concern   Not on file  Social History Narrative   Not on file   Social Determinants of Health   Financial Resource Strain: Not on file  Food Insecurity: Not on file  Transportation Needs: Not on file  Physical Activity: Not on file  Stress: Not on file  Social Connections: Not on file  Intimate Partner Violence: Not on file    Outpatient Medications Prior to Visit  Medication Sig Dispense Refill   ALPRAZolam (XANAX) 1 MG tablet Take 1 tablet (1 mg total) by mouth at bedtime as needed for anxiety. 30 tablet 2   citalopram (CELEXA) 40 MG tablet Take 1 tablet (40 mg total) by mouth daily. 90 tablet 1   cycloSPORINE (RESTASIS) 0.05 % ophthalmic emulsion Place 1 drop into both eyes 2 (two) times daily. 180 mL 4   EPINEPHrine (EPIPEN 2-PAK) 0.3 mg/0.3 mL IJ SOAJ injection  Inject 0.3 mLs (0.3 mg total) into the muscle as needed for anaphylaxis. 1 each 0   fluticasone (FLONASE) 50 MCG/ACT nasal spray Place 1 spray into both nostrils 2 (two) times daily as needed for allergies or rhinitis 16 g 11   ipratropium (ATROVENT) 0.06 % nasal spray Place 2 sprays into both nostrils 3 (three) times daily. 15 mL 11   lansoprazole (PREVACID) 30 MG capsule Take 1 capsule (30 mg total) by mouth daily at 12 noon. 90 capsule 3   levocetirizine (XYZAL) 5 MG tablet Take 1 tablet (5 mg total) by mouth every evening. 30 tablet 11   levonorgestrel (MIRENA) 20 MCG/24HR IUD 1 each by Intrauterine route once.  senna (SENOKOT) 8.6 MG tablet Take 1 tablet (8.6 mg total) by mouth daily. 30 tablet 0   valACYclovir (VALTREX) 1000 MG tablet Take 1,000 mg by mouth daily.     cromolyn (OPTICROM) 4 % ophthalmic solution Place 2 drops into both eyes 4 (four) times daily. Place 1-2 drops in each eye up to 4 times a day as needed for red or itchy eyes 10 mL 2   predniSONE (DELTASONE) 10 MG tablet Begin prednisone 10 mg tablets. Take 2 tablets twice a day for 3 days, then take 2 tablets once a day for 1 day, then take 1 tablet on the 5th day, then stop 15 tablet 0   No facility-administered medications prior to visit.    Allergies  Allergen Reactions   Heparin    Heparin (Porcine) Other (See Comments)   Ativan [Lorazepam] Other (See Comments)    Nightmares and feelings of "withdrawal"    Phentermine Palpitations    ROS Review of Systems  Constitutional:  Positive for fatigue. Negative for chills and fever.  HENT:  Negative for congestion, sinus pressure, sinus pain and sore throat.   Eyes:  Negative for pain and discharge.  Respiratory:  Negative for cough and shortness of breath.   Cardiovascular:  Positive for leg swelling. Negative for chest pain and palpitations.  Gastrointestinal:  Positive for constipation. Negative for abdominal pain, nausea and vomiting.  Endocrine: Negative for  polydipsia and polyuria.  Genitourinary:  Negative for dysuria and hematuria.  Musculoskeletal:  Positive for arthralgias and myalgias. Negative for neck stiffness.  Skin:  Negative for rash.  Allergic/Immunologic: Positive for environmental allergies.  Neurological:  Negative for dizziness and weakness.  Psychiatric/Behavioral:  Positive for sleep disturbance. Negative for agitation and behavioral problems. The patient is nervous/anxious.      Objective:    Physical Exam Vitals reviewed.  Constitutional:      General: She is not in acute distress.    Appearance: She is not diaphoretic.  HENT:     Head: Normocephalic and atraumatic.     Nose: Nose normal.     Mouth/Throat:     Mouth: Mucous membranes are moist.  Eyes:     General: No scleral icterus.    Extraocular Movements: Extraocular movements intact.  Cardiovascular:     Rate and Rhythm: Normal rate and regular rhythm.     Pulses: Normal pulses.     Heart sounds: Normal heart sounds. No murmur heard. Pulmonary:     Breath sounds: Normal breath sounds. No wheezing or rales.  Musculoskeletal:     Cervical back: Neck supple. No tenderness.     Right lower leg: No edema.     Left lower leg: No edema.  Skin:    General: Skin is warm.     Findings: No rash.  Neurological:     General: No focal deficit present.     Mental Status: She is alert and oriented to person, place, and time.     Sensory: No sensory deficit.     Motor: No weakness.  Psychiatric:        Mood and Affect: Mood normal.        Behavior: Behavior normal.    BP 122/78 (BP Location: Left Arm, Patient Position: Sitting, Cuff Size: Normal)    Pulse (!) 104    Resp 18    Ht _0  (1.676 m)    Wt 210 lb 1.3 oz (95.3 kg)    SpO2 97%    BMI  33.91 kg/m  Wt Readings from Last 3 Encounters:  09/21/21 210 lb 1.3 oz (95.3 kg)  05/20/21 207 lb 1.9 oz (93.9 kg)  04/01/21 210 lb 9.6 oz (95.5 kg)    Lab Results  Component Value Date   TSH 2.320 07/12/2020    Lab Results  Component Value Date   WBC 9.4 09/30/2020   HGB 13.5 09/30/2020   HCT 39.6 09/30/2020   MCV 88 09/30/2020   PLT 312 07/12/2020   Lab Results  Component Value Date   NA 138 07/12/2020   K 4.1 07/12/2020   CO2 24 07/12/2020   GLUCOSE 90 07/12/2020   BUN 9 07/12/2020   CREATININE 0.92 07/12/2020   BILITOT 0.7 07/12/2020   ALKPHOS 88 07/12/2020   AST 14 07/12/2020   ALT 21 07/12/2020   PROT 7.1 07/12/2020   ALBUMIN 4.4 07/12/2020   CALCIUM 9.1 07/12/2020   ANIONGAP 9 08/02/2018   Lab Results  Component Value Date   CHOL 219 (H) 06/21/2017   Lab Results  Component Value Date   HDL 44 06/21/2017   Lab Results  Component Value Date   LDLCALC 151 (H) 06/21/2017   Lab Results  Component Value Date   TRIG 118 06/21/2017   Lab Results  Component Value Date   CHOLHDL 5.0 (H) 06/21/2017   Lab Results  Component Value Date   HGBA1C 5.8 (H) 06/21/2017      Assessment & Plan:   Problem List Items Addressed This Visit       Cardiovascular and Mediastinum   Essential hypertension - Primary    BP Readings from Last 1 Encounters:  09/21/21 122/78  Well controlled with diet modification Continue HCTZ 12.5 mg as needed for leg swelling Counseled for compliance with the medications Advised DASH diet and moderate exercise/walking, at least 150 mins/week       Relevant Medications   hydrochlorothiazide (MICROZIDE) 12.5 MG capsule   Other Relevant Orders   CBC with Differential/Platelet   CMP14+EGFR     Digestive   Gastroesophageal reflux disease    Well controlled with Prevacid        Other   Generalized anxiety disorder    Well-controlled with Xanax 1 mg daily and Celexa Had switched to Cymbalta for benefit with fibromyalgia, but did not tolerate it well      Obesity    Advised to follow low-carb diet and perform moderate exercise at least 150 minutes/week She is a good candidate for the COVID, will discuss in the next visit       Relevant Orders   Hemoglobin A1c   Lipid Profile   Prediabetes    Lab Results  Component Value Date   HGBA1C 5.8 (H) 06/21/2017  Continue to follow low carb diet      Relevant Orders   Hemoglobin A1c   Fibromyalgia    Had Rheumatology evaluation in 2019, was told of fibromyalgia, but did not start any treatment at that time Had started Cymbalta, but did not tolerate it If persistent symptoms or insomnia, may switch to Elavil Was referred to Rheumatology, but they do not manage fibromyalgia      Relevant Orders   TSH   Other Visit Diagnoses     Leg swelling       Relevant Medications   hydrochlorothiazide (MICROZIDE) 12.5 MG capsule       Meds ordered this encounter  Medications   hydrochlorothiazide (MICROZIDE) 12.5 MG capsule    Sig: Take 1 capsule (12.5  mg total) by mouth daily as needed.    Dispense:  30 capsule    Refill:  2    Follow-up: Return in about 3 months (around 12/19/2021) for GAD and weight management.    Lindell Spar, MD

## 2021-09-21 NOTE — Assessment & Plan Note (Signed)
Well-controlled with Xanax 1 mg daily and Celexa Had switched to Cymbalta for benefit with fibromyalgia, but did not tolerate it well 

## 2021-09-21 NOTE — Assessment & Plan Note (Signed)
Lab Results  Component Value Date   HGBA1C 5.8 (H) 06/21/2017   Continue to follow low carb diet

## 2021-09-21 NOTE — Assessment & Plan Note (Addendum)
Had Rheumatology evaluation in 2019, was told of fibromyalgia, but did not start any treatment at that time Had started Cymbalta, but did not tolerate it If persistent symptoms or insomnia, may switch to Elavil Was referred to Rheumatology, but they do not manage fibromyalgia 

## 2021-09-21 NOTE — Patient Instructions (Addendum)
Please continue taking medications as prescribed.  Please continue to follow low carb diet and perform moderate exercise/walking at least 150 mins/week.  Please get fasting blood tests done before the next visit. 

## 2021-09-21 NOTE — Assessment & Plan Note (Signed)
Advised to follow low-carb diet and perform moderate exercise at least 150 minutes/week She is a good candidate for the COVID, will discuss in the next visit

## 2021-09-21 NOTE — Assessment & Plan Note (Signed)
BP Readings from Last 1 Encounters:  09/21/21 122/78   Well controlled with diet modification Continue HCTZ 12.5 mg as needed for leg swelling Counseled for compliance with the medications Advised DASH diet and moderate exercise/walking, at least 150 mins/week

## 2021-09-21 NOTE — Assessment & Plan Note (Signed)
Well controlled with Prevacid 

## 2021-09-23 ENCOUNTER — Ambulatory Visit (INDEPENDENT_AMBULATORY_CARE_PROVIDER_SITE_OTHER): Payer: No Typology Code available for payment source | Admitting: *Deleted

## 2021-09-23 ENCOUNTER — Encounter: Payer: Self-pay | Admitting: *Deleted

## 2021-09-23 DIAGNOSIS — J309 Allergic rhinitis, unspecified: Secondary | ICD-10-CM | POA: Diagnosis not present

## 2021-09-26 ENCOUNTER — Encounter: Payer: Self-pay | Admitting: Internal Medicine

## 2021-09-26 ENCOUNTER — Emergency Department (HOSPITAL_COMMUNITY)
Admission: EM | Admit: 2021-09-26 | Discharge: 2021-09-26 | Disposition: A | Discharge status: Home / Self Care | Payer: No Typology Code available for payment source | Attending: Emergency Medicine | Admitting: Emergency Medicine

## 2021-09-26 ENCOUNTER — Institutional Professional Consult (permissible substitution): Payer: No Typology Code available for payment source | Admitting: Neurology

## 2021-09-26 ENCOUNTER — Other Ambulatory Visit: Payer: Self-pay

## 2021-09-26 ENCOUNTER — Encounter (HOSPITAL_COMMUNITY): Payer: Self-pay | Admitting: Pharmacy Technician

## 2021-09-26 ENCOUNTER — Emergency Department (HOSPITAL_COMMUNITY): Payer: No Typology Code available for payment source

## 2021-09-26 DIAGNOSIS — I10 Essential (primary) hypertension: Secondary | ICD-10-CM | POA: Insufficient documentation

## 2021-09-26 DIAGNOSIS — R1031 Right lower quadrant pain: Secondary | ICD-10-CM | POA: Diagnosis present

## 2021-09-26 LAB — URINALYSIS, ROUTINE W REFLEX MICROSCOPIC
Bilirubin Urine: NEGATIVE
Glucose, UA: NEGATIVE mg/dL
Ketones, ur: NEGATIVE mg/dL
Nitrite: NEGATIVE
Protein, ur: NEGATIVE mg/dL
Specific Gravity, Urine: 1.002 — ABNORMAL LOW (ref 1.005–1.030)
pH: 7 (ref 5.0–8.0)

## 2021-09-26 LAB — DIFFERENTIAL
Abs Immature Granulocytes: 0.03 10*3/uL (ref 0.00–0.07)
Basophils Absolute: 0.1 10*3/uL (ref 0.0–0.1)
Basophils Relative: 1 %
Eosinophils Absolute: 0.4 10*3/uL (ref 0.0–0.5)
Eosinophils Relative: 6 %
Immature Granulocytes: 0 %
Lymphocytes Relative: 21 %
Lymphs Abs: 1.6 10*3/uL (ref 0.7–4.0)
Monocytes Absolute: 0.4 10*3/uL (ref 0.1–1.0)
Monocytes Relative: 5 %
Neutro Abs: 5.1 10*3/uL (ref 1.7–7.7)
Neutrophils Relative %: 67 %

## 2021-09-26 LAB — CBC
HCT: 39.9 % (ref 36.0–46.0)
Hemoglobin: 13.3 g/dL (ref 12.0–15.0)
MCH: 30.6 pg (ref 26.0–34.0)
MCHC: 33.3 g/dL (ref 30.0–36.0)
MCV: 91.9 fL (ref 80.0–100.0)
Platelets: 297 10*3/uL (ref 150–400)
RBC: 4.34 MIL/uL (ref 3.87–5.11)
RDW: 12.7 % (ref 11.5–15.5)
WBC: 7.6 10*3/uL (ref 4.0–10.5)
nRBC: 0 % (ref 0.0–0.2)

## 2021-09-26 LAB — COMPREHENSIVE METABOLIC PANEL
ALT: 32 U/L (ref 0–44)
AST: 26 U/L (ref 15–41)
Albumin: 3.8 g/dL (ref 3.5–5.0)
Alkaline Phosphatase: 68 U/L (ref 38–126)
Anion gap: 11 (ref 5–15)
BUN: 12 mg/dL (ref 6–20)
CO2: 24 mmol/L (ref 22–32)
Calcium: 8.8 mg/dL — ABNORMAL LOW (ref 8.9–10.3)
Chloride: 101 mmol/L (ref 98–111)
Creatinine, Ser: 0.86 mg/dL (ref 0.44–1.00)
GFR, Estimated: >60 mL/min (ref >60)
Glucose, Bld: 123 mg/dL — ABNORMAL HIGH (ref 70–99)
Potassium: 3.8 mmol/L (ref 3.5–5.1)
Sodium: 136 mmol/L (ref 135–145)
Total Bilirubin: 0.5 mg/dL (ref 0.3–1.2)
Total Protein: 7.1 g/dL (ref 6.5–8.1)

## 2021-09-26 LAB — POC URINE PREG, ED: Preg Test, Ur: NEGATIVE

## 2021-09-26 LAB — LIPASE, BLOOD: Lipase: 29 U/L (ref 11–51)

## 2021-09-26 MED ORDER — DICYCLOMINE HCL 10 MG PO CAPS
10.0000 mg | ORAL_CAPSULE | Freq: Once | ORAL | Status: AC
Start: 2021-09-26 — End: 2021-09-26
  Administered 2021-09-26: 10 mg via ORAL
  Filled 2021-09-26: qty 1

## 2021-09-26 MED ORDER — SODIUM CHLORIDE 0.9 % IV BOLUS
1000.0000 mL | Freq: Once | INTRAVENOUS | Status: AC
  Administered 2021-09-26: 1000 mL via INTRAVENOUS

## 2021-09-26 MED ORDER — PHENAZOPYRIDINE HCL 200 MG PO TABS: 200.0000 mg | ORAL_TABLET | Freq: Three times a day (TID) | ORAL | 0 refills | Status: DC

## 2021-09-26 MED ORDER — CEPHALEXIN 500 MG PO CAPS: 500.0000 mg | ORAL_CAPSULE | Freq: Four times a day (QID) | ORAL | 0 refills | Status: DC

## 2021-09-26 MED ORDER — ONDANSETRON HCL 4 MG/2ML IJ SOLN
4.0000 mg | Freq: Once | INTRAMUSCULAR | Status: AC
  Administered 2021-09-26: 4 mg via INTRAVENOUS
  Filled 2021-09-26: qty 2

## 2021-09-26 MED ORDER — IOHEXOL 300 MG/ML  SOLN
100.0000 mL | Freq: Once | INTRAMUSCULAR | Status: AC | PRN
  Administered 2021-09-26: 100 mL via INTRAVENOUS

## 2021-09-26 NOTE — ED Triage Notes (Addendum)
Pt here with reports of RLQ abdominal pain intermittent X4 weeks. Seen by gynocologist last week to R/O cyst. Pt also complains of fatigue and nausea. Denies fevers, diarrhea or constipation. Reports having to urinate constantly.

## 2021-09-26 NOTE — Discharge Instructions (Signed)
Do the MiraLAX bowel prep at home as we discussed.  Continue taking your home medicine.  Your work-up today was reassuring, I would reach out to your gynecologist and your GI docotor to let them know this is still going on.  You may need to have more of an evaluation done by your GI physician given the extend of constipation seen on CT despite regular bowel movements.

## 2021-09-26 NOTE — ED Notes (Signed)
Patient Alert and oriented to baseline. Stable and ambulatory to baseline. Patient verbalized understanding of the discharge instructions.  Patient belongings were taken by the patient.   

## 2021-09-26 NOTE — ED Provider Notes (Signed)
[facility] EMERGENCY DEPARTMENT Provider Note   CSN: [id number] Arrival date & time: 09/26/21  9798     History  Chief Complaint  Patient presents with   Abdominal Pain    Theresa [name] is a 51 y.o. female.   Abdominal Pain  Patient presents with right lower quadrant abdominal pain.  It started 4 weeks ago, the pain itself is constant but the severity varies.  Unable to and if any provoking features, the pain is severe it feels sharp, otherwise just feels like a dull throbbing pain.  Yesterday it radiated to her back, before that it stayed localized.  Denies any vomiting but has felt nauseated and more fatigued than at baseline.  She saw her OBG 3 weeks ago and had a transvaginal ultrasound which was unrevealing.   I also endorses feeling of constant urinary urgency.  Status postcholecystectomy.  Patient history of chronic fatigue, constipation, GAD, fibromyalgia, hypertension  Home Medications Prior to Admission medications   Medication Sig Start Date End Date Taking? Authorizing Provider  cephALEXin (KEFLEX) 500 MG capsule Take 1 capsule (500 mg total) by mouth 4 (four) times daily. 09/26/21  Yes Sherrill Raring, PA-C  phenazopyridine (PYRIDIUM) 200 MG tablet Take 1 tablet (200 mg total) by mouth 3 (three) times daily. 09/26/21  Yes Sherrill Raring, PA-C  ALPRAZolam Duanne Moron) 1 MG tablet Take 1 tablet (1 mg total) by mouth at bedtime as needed for anxiety. 08/10/21   Lindell Spar, MD  citalopram (CELEXA) 40 MG tablet Take 1 tablet (40 mg total) by mouth daily. 08/10/21   Lindell Spar, MD  cycloSPORINE (RESTASIS) 0.05 % ophthalmic emulsion Place 1 drop into both eyes 2 (two) times daily. 11/16/20     EPINEPHrine (EPIPEN 2-PAK) 0.3 mg/0.3 mL IJ SOAJ injection Inject 0.3 mLs (0.3 mg total) into the muscle as needed for anaphylaxis. 09/10/19   Valentina Shaggy, MD  fluticasone Asencion Islam) 50 MCG/ACT nasal spray Place 1 spray into both nostrils 2 (two) times daily as needed for allergies or  rhinitis 04/03/21   Valentina Shaggy, MD  hydrochlorothiazide (MICROZIDE) 12.5 MG capsule Take 1 capsule (12.5 mg total) by mouth daily as needed. 09/21/21   Lindell Spar, MD  ipratropium (ATROVENT) 0.06 % nasal spray Place 2 sprays into both nostrils 3 (three) times daily. 04/03/21   Valentina Shaggy, MD  lansoprazole (PREVACID) 30 MG capsule Take 1 capsule (30 mg total) by mouth daily at 12 noon. 03/27/21   Rogene Houston, MD  levocetirizine (XYZAL) 5 MG tablet Take 1 tablet (5 mg total) by mouth every evening. 04/03/21   Valentina Shaggy, MD  levonorgestrel (MIRENA) 20 MCG/24HR IUD 1 each by Intrauterine route once.    [provider]  senna (SENOKOT) 8.6 MG tablet Take 1 tablet (8.6 mg total) by mouth daily. 06/24/21   Fayrene Helper, MD  valACYclovir (VALTREX) 1000 MG tablet Take 1,000 mg by mouth daily. 08/27/19   [provider]      Allergies    Heparin, Heparin (porcine), Ativan [lorazepam], and Phentermine    Review of Systems   Review of Systems  Gastrointestinal:  Positive for abdominal pain.   Physical Exam Updated Vital Signs BP 111/76    Pulse 69    Temp 98 F (36.7 C)    Resp 16    SpO2 100%  Physical Exam Vitals and nursing note reviewed. Exam conducted with a chaperone present.  Constitutional:      Appearance: Normal appearance.  Comments: BMI 33.91  HENT:     Head: Normocephalic and atraumatic.  Eyes:     General: No scleral icterus.       Right eye: No discharge.        Left eye: No discharge.     Extraocular Movements: Extraocular movements intact.     Pupils: Pupils are equal, round, and reactive to light.  Cardiovascular:     Rate and Rhythm: Normal rate and regular rhythm.     Pulses: Normal pulses.     Heart sounds: Normal heart sounds. No murmur heard.   No friction rub. No gallop.  Pulmonary:     Effort: Pulmonary effort is normal. No respiratory distress.     Breath sounds: Normal breath sounds.  Abdominal:      General: Abdomen is flat. Bowel sounds are normal. There is no distension.     Palpations: Abdomen is soft.     Tenderness: There is abdominal tenderness in the right lower quadrant. There is no guarding or rebound.  Skin:    General: Skin is warm and dry.     Coloration: Skin is not jaundiced.  Neurological:     Mental Status: She is alert. Mental status is at baseline.     Coordination: Coordination normal.    ED Results / Procedures / Treatments   Labs (all labs ordered are listed, but only abnormal results are displayed) Labs Reviewed  COMPREHENSIVE METABOLIC PANEL - Abnormal; Notable for the following components:      Result Value   Glucose, Bld 123 (*)    Calcium 8.8 (*)    All other components within normal limits  URINALYSIS, ROUTINE W REFLEX MICROSCOPIC - Abnormal; Notable for the following components:   APPearance HAZY (*)    Specific Gravity, Urine 1.002 (*)    Hgb urine dipstick SMALL (*)    Leukocytes,Ua LARGE (*)    Bacteria, UA RARE (*)    All other components within normal limits  URINE CULTURE  LIPASE, BLOOD  CBC  DIFFERENTIAL  POC URINE PREG, ED    EKG None  Radiology CT Abdomen Pelvis W Contrast  Result Date: 09/26/2021 CLINICAL DATA:  Four months of intermittent right lower quadrant abdominal pain and nausea EXAM: CT ABDOMEN AND PELVIS WITH CONTRAST TECHNIQUE: Multidetector CT imaging of the abdomen and pelvis was performed using the standard protocol following bolus administration of intravenous contrast. RADIATION DOSE REDUCTION: This exam was performed according to the departmental dose-optimization program which includes automated exposure control, adjustment of the mA and/or kV according to patient size and/or use of iterative reconstruction technique. CONTRAST:  184mL OMNIPAQUE IOHEXOL 300 MG/ML  SOLN COMPARISON:  PET-CT February 27, 2020 and CT abdomen and pelvis March 14, 2018 FINDINGS: Lower chest: No acute abnormality. Hepatobiliary: No  suspicious hepatic lesion. Gallbladder is surgically absent. No biliary ductal dilation. Pancreas: No pancreatic ductal dilation or evidence of acute inflammation. Spleen: Normal in size without focal abnormality. Adrenals/Urinary Tract: Bilateral adrenal glands appear normal. No hydronephrosis. Punctate nonobstructive bilateral renal stones. Kidneys demonstrate symmetric enhancement and excretion of contrast material. No solid enhancing renal mass. Urinary bladder is unremarkable for degree of distension. Stomach/Bowel: No radiopaque enteric contrast was administered. Stomach is unremarkable for degree of distension. No pathologic dilation of small or large bowel. The appendix and terminal ileum appear normal. Moderate volume of formed stool throughout the colon suggestive of constipation. No evidence of acute bowel inflammation. Vascular/Lymphatic: Normal caliber abdominal aorta. No pathologically enlarged abdominal or pelvic lymph  nodes. Reproductive: Intrauterine device in place. No suspicious adnexal mass. Other: No significant abdominopelvic free fluid. Musculoskeletal: No acute or significant osseous findings. IMPRESSION: 1. No acute abdominopelvic findings. 2. Moderate volume of formed stool throughout the colon suggestive of constipation. 3. Punctate nonobstructive bilateral renal stones. Electronically Signed   By: Dahlia Bailiff M.D.   On: 09/26/2021 11:40    Procedures Procedures    Medications Ordered in ED Medications  sodium chloride 0.9 % bolus 1,000 mL (0 mLs Intravenous Stopped 09/26/21 1217)  ondansetron (ZOFRAN) injection 4 mg (4 mg Intravenous Given 09/26/21 1142)  dicyclomine (BENTYL) capsule 10 mg (10 mg Oral Given 09/26/21 1142)  iohexol (OMNIPAQUE) 300 MG/ML solution 100 mL (100 mLs Intravenous Contrast Given 09/26/21 1134)    ED Course/ Medical Decision Making/ A&P                           Medical Decision Making Amount and/or Complexity of Data Reviewed Labs:  ordered. Radiology: ordered.  Risk Prescription drug management.   This patient presents to the ED for concern of right lower quadrant abdominal pain, this involves an extensive number of treatment options, and is a complaint that carries with it a high risk of complications and morbidity.  The differential diagnosis includes appendicitis, ovarian cyst, ovarian torsion, atypical colitis, muscle strain, other   Co morbidities that complicate the patient evaluation: Fibromyalgia, chronic fatigue, hypertension, obesity   Additional history obtained: -Additional history obtained from care everywhere.  Patient had an ultrasound at her OB/GYN visit on 09/15/2021.  I am unable to see the results from that visit.  Per the patient they are normal and there is no documentation of visualized masses. -External records from outside source obtained and reviewed including: Chart review including previous notes, labs, imaging, consultation notes   Lab Tests: -I ordered, reviewed, and interpreted labs.  The pertinent results include: No leukocytosis or anemia.  No gross electrolyte derangement.  There is trace amount of hemoglobin and large amount of leukocytes in the urine.  Patient is also not pregnant.   Imaging Studies ordered: -I ordered imaging studies including CT abdomen   -I independently visualized and interpreted imaging which showed heavy stool burden but no acute process -I agree with the radiologist interpretation   Medicines ordered and prescription drug management: -I ordered medication including zofran, bentyl, NS  for pain and nausea   -Reevaluation of the patient after these medicines showed that the patient improved -I have reviewed the patients home medicines and have made adjustments as needed   ED Course: Very pleasant 51 year old female presenting due to right lower quadrant abdominal pain.  She is hypertensive, otherwise vitals unremarkable.  There is tenderness to the right  lower quadrant but there is no rigidity or peritoneal signs.  Given the severity of her symptoms and the fact she has been experiencing for 4 weeks we will proceed with CT to better evaluate for signs of acute process such as appendicitis. Serial abdominal exams benign.  Patient's pain improved.  There is a heavy stool burden on CT but no acute processes and appendicitis.  I discussed with the patient ultrasound, given her stable vitals and my low suspicion for ovarian torsion, TOA oh a, ectopic pregnancy I do not think that would be beneficial or needed on an ED basis.  Patient verbalized agreement with this plan.  Discussed the CT results, advised bowel prep and following up with her GI doctor who she  follows with.  She states her bowel movements are somewhat regular with senna but does endorse a history of constipation and states she has tried plenty of laxatives without improvement.  Doubt a bowel obstruction, do not see reason for additional work-up at this time.  Given leukocytes in the setting of dysuria will discharge discharge with Pyridium as well as short course of Keflex.  Discharged in stable condition.   Cardiac Monitoring: The patient was maintained on a cardiac monitor.  I personally viewed and interpreted the cardiac monitored which showed an underlying rhythm of: NSR   Reevaluation: After the interventions noted above, I reevaluated the patient and found that they have :improved   Dispostion: D/C         Final Clinical Impression(s) / ED Diagnoses Final diagnoses:  Right lower quadrant abdominal pain    Rx / DC Orders ED Discharge Orders          Ordered    cephALEXin (KEFLEX) 500 MG capsule  4 times daily        09/26/21 1229    phenazopyridine (PYRIDIUM) 200 MG tablet  3 times daily        09/26/21 1229              Sherrill Raring, [place] 09/26/21 1910    Godfrey Pick, MD 09/30/21 1303

## 2021-09-28 ENCOUNTER — Other Ambulatory Visit (HOSPITAL_COMMUNITY): Payer: Self-pay

## 2021-09-28 ENCOUNTER — Telehealth (INDEPENDENT_AMBULATORY_CARE_PROVIDER_SITE_OTHER): Payer: Self-pay | Admitting: Internal Medicine

## 2021-09-28 MED ORDER — LINACLOTIDE 72 MCG PO CAPS
72.0000 ug | ORAL_CAPSULE | Freq: Every day | ORAL | 2 refills | Status: DC
  Filled 2021-09-28: qty 30, 30d supply, fill #0
  Filled 2021-10-26: qty 30, 30d supply, fill #1
  Filled 2021-12-08: qty 30, 30d supply, fill #2

## 2021-09-28 NOTE — Telephone Encounter (Signed)
Patient called 2 days ago to tell me she was in emergency room.  She thought she had appendicitis but she was diagnosed with constipation. She has a history of constipation but lately she has been having bowel movement daily and did not feel constipated.  We will start her on Linzess 72 mcg p.o. daily and arrange for office visit.

## 2021-09-29 ENCOUNTER — Encounter: Payer: Self-pay | Admitting: Allergy & Immunology

## 2021-09-29 LAB — URINE CULTURE: Culture: 70000 — AB

## 2021-09-30 ENCOUNTER — Telehealth: Payer: Self-pay

## 2021-09-30 NOTE — Telephone Encounter (Signed)
Post ED Visit - Positive Culture Follow-up  Culture report reviewed by antimicrobial stewardship pharmacist: [place] Team [x]  Bertis Ruddy, Pharm.D. []  Heide Guile, Pharm.D., BCPS AQ-ID []  Parks Neptune, Pharm.D., BCPS []  Alycia Rossetti, Pharm.D., BCPS []  [place], [place].D., BCPS, AAHIVP []  Legrand Como, Pharm.D., BCPS, AAHIVP []  Salome Arnt, PharmD, BCPS []  Johnnette Gourd, PharmD, BCPS []  Hughes Better, PharmD, BCPS []  Leeroy Cha, PharmD []  Laqueta Linden, PharmD, BCPS []  Albertina Parr, PharmD  [place] Team []  Leodis Sias, PharmD []  Lindell Spar, PharmD []  Royetta Asal, PharmD []  Graylin Shiver, Rph []  Rema Fendt) Glennon Mac, PharmD []  Arlyn Dunning, PharmD []  Netta Cedars, PharmD []  Dia Sitter, PharmD []  Leone Haven, PharmD []  Gretta Arab, PharmD []  Theodis Shove, PharmD []  Peggyann Juba, PharmD []  Reuel Boom, PharmD   Positive urine culture Treated with Cephalexin, organism sensitive to the same and no further patient follow-up is required at this time.  Glennon Hamilton 09/30/2021, 9:23 AM

## 2021-10-05 ENCOUNTER — Other Ambulatory Visit (HOSPITAL_COMMUNITY): Payer: Self-pay

## 2021-10-05 ENCOUNTER — Ambulatory Visit (INDEPENDENT_AMBULATORY_CARE_PROVIDER_SITE_OTHER): Payer: No Typology Code available for payment source

## 2021-10-05 DIAGNOSIS — J309 Allergic rhinitis, unspecified: Secondary | ICD-10-CM | POA: Diagnosis not present

## 2021-10-12 ENCOUNTER — Ambulatory Visit (INDEPENDENT_AMBULATORY_CARE_PROVIDER_SITE_OTHER): Payer: No Typology Code available for payment source

## 2021-10-12 DIAGNOSIS — J309 Allergic rhinitis, unspecified: Secondary | ICD-10-CM | POA: Diagnosis not present

## 2021-10-17 ENCOUNTER — Other Ambulatory Visit (HOSPITAL_COMMUNITY): Payer: Self-pay

## 2021-10-18 ENCOUNTER — Other Ambulatory Visit (HOSPITAL_COMMUNITY): Payer: Self-pay

## 2021-10-21 ENCOUNTER — Other Ambulatory Visit (HOSPITAL_COMMUNITY): Payer: Self-pay

## 2021-10-24 ENCOUNTER — Other Ambulatory Visit (HOSPITAL_COMMUNITY): Payer: Self-pay

## 2021-10-26 ENCOUNTER — Other Ambulatory Visit (HOSPITAL_COMMUNITY): Payer: Self-pay

## 2021-10-26 ENCOUNTER — Ambulatory Visit (INDEPENDENT_AMBULATORY_CARE_PROVIDER_SITE_OTHER): Payer: No Typology Code available for payment source | Admitting: *Deleted

## 2021-10-26 DIAGNOSIS — J309 Allergic rhinitis, unspecified: Secondary | ICD-10-CM | POA: Diagnosis not present

## 2021-11-02 ENCOUNTER — Other Ambulatory Visit (HOSPITAL_COMMUNITY): Payer: Self-pay

## 2021-11-02 ENCOUNTER — Ambulatory Visit (INDEPENDENT_AMBULATORY_CARE_PROVIDER_SITE_OTHER): Payer: No Typology Code available for payment source

## 2021-11-02 ENCOUNTER — Other Ambulatory Visit: Payer: Self-pay | Admitting: Internal Medicine

## 2021-11-02 DIAGNOSIS — J309 Allergic rhinitis, unspecified: Secondary | ICD-10-CM | POA: Diagnosis not present

## 2021-11-02 DIAGNOSIS — F411 Generalized anxiety disorder: Secondary | ICD-10-CM

## 2021-11-02 MED ORDER — ALPRAZOLAM 1 MG PO TABS
1.0000 mg | ORAL_TABLET | Freq: Every evening | ORAL | 2 refills | Status: DC | PRN
  Filled 2021-11-02: qty 30, 30d supply, fill #0
  Filled 2021-11-30: qty 30, 30d supply, fill #1
  Filled 2021-12-29: qty 30, 30d supply, fill #2

## 2021-11-09 ENCOUNTER — Ambulatory Visit (INDEPENDENT_AMBULATORY_CARE_PROVIDER_SITE_OTHER): Payer: No Typology Code available for payment source

## 2021-11-09 ENCOUNTER — Encounter: Payer: Self-pay | Admitting: Internal Medicine

## 2021-11-09 DIAGNOSIS — J309 Allergic rhinitis, unspecified: Secondary | ICD-10-CM | POA: Diagnosis not present

## 2021-11-13 LAB — CMP14+EGFR
ALT: 25 IU/L (ref 0–32)
AST: 19 IU/L (ref 0–40)
Albumin/Globulin Ratio: 1.6 (ref 1.2–2.2)
Albumin: 4.6 g/dL (ref 3.8–4.8)
Alkaline Phosphatase: 90 IU/L (ref 44–121)
BUN/Creatinine Ratio: 13 (ref 9–23)
BUN: 11 mg/dL (ref 6–24)
Bilirubin Total: 0.4 mg/dL (ref 0.0–1.2)
CO2: 19 mmol/L — ABNORMAL LOW (ref 20–29)
Calcium: 9.3 mg/dL (ref 8.7–10.2)
Chloride: 99 mmol/L (ref 96–106)
Creatinine, Ser: 0.85 mg/dL (ref 0.57–1.00)
Globulin, Total: 2.8 g/dL (ref 1.5–4.5)
Glucose: 175 mg/dL — ABNORMAL HIGH (ref 70–99)
Potassium: 4.1 mmol/L (ref 3.5–5.2)
Sodium: 135 mmol/L (ref 134–144)
Total Protein: 7.4 g/dL (ref 6.0–8.5)
eGFR: 84 mL/min/{1.73_m2} (ref >59)

## 2021-11-13 LAB — CBC WITH DIFFERENTIAL/PLATELET
Basophils Absolute: 0 10*3/uL (ref 0.0–0.2)
Basos: 1 %
EOS (ABSOLUTE): 0.2 10*3/uL (ref 0.0–0.4)
Eos: 3 %
Hematocrit: 41 % (ref 34.0–46.6)
Hemoglobin: 13.6 g/dL (ref 11.1–15.9)
Immature Grans (Abs): 0 10*3/uL (ref 0.0–0.1)
Immature Granulocytes: 0 %
Lymphocytes Absolute: 1.5 10*3/uL (ref 0.7–3.1)
Lymphs: 18 %
MCH: 29.6 pg (ref 26.6–33.0)
MCHC: 33.2 g/dL (ref 31.5–35.7)
MCV: 89 fL (ref 79–97)
Monocytes Absolute: 0.3 10*3/uL (ref 0.1–0.9)
Monocytes: 4 %
Neutrophils Absolute: 6.1 10*3/uL (ref 1.4–7.0)
Neutrophils: 74 %
Platelets: 299 10*3/uL (ref 150–450)
RBC: 4.59 x10E6/uL (ref 3.77–5.28)
RDW: 13 % (ref 11.7–15.4)
WBC: 8.2 10*3/uL (ref 3.4–10.8)

## 2021-11-13 LAB — LIPID PANEL
Chol/HDL Ratio: 5.6 ratio — ABNORMAL HIGH (ref 0.0–4.4)
Cholesterol, Total: 257 mg/dL — ABNORMAL HIGH (ref 100–199)
HDL: 46 mg/dL (ref >39)
LDL Chol Calc (NIH): 180 mg/dL — ABNORMAL HIGH (ref 0–99)
Triglycerides: 166 mg/dL — ABNORMAL HIGH (ref 0–149)
VLDL Cholesterol Cal: 31 mg/dL (ref 5–40)

## 2021-11-13 LAB — HEMOGLOBIN A1C
Est. average glucose Bld gHb Est-mCnc: 120 mg/dL
Hgb A1c MFr Bld: 5.8 % — ABNORMAL HIGH (ref 4.8–5.6)

## 2021-11-13 LAB — TSH: TSH: 1.82 u[IU]/mL (ref 0.450–4.500)

## 2021-11-13 LAB — SPECIMEN STATUS REPORT

## 2021-11-14 ENCOUNTER — Encounter: Payer: Self-pay | Admitting: Internal Medicine

## 2021-11-16 ENCOUNTER — Ambulatory Visit (INDEPENDENT_AMBULATORY_CARE_PROVIDER_SITE_OTHER): Payer: No Typology Code available for payment source

## 2021-11-16 DIAGNOSIS — J309 Allergic rhinitis, unspecified: Secondary | ICD-10-CM | POA: Diagnosis not present

## 2021-11-22 ENCOUNTER — Other Ambulatory Visit (HOSPITAL_COMMUNITY): Payer: Self-pay

## 2021-11-23 ENCOUNTER — Other Ambulatory Visit (HOSPITAL_COMMUNITY): Payer: Self-pay

## 2021-11-23 ENCOUNTER — Ambulatory Visit (INDEPENDENT_AMBULATORY_CARE_PROVIDER_SITE_OTHER): Payer: No Typology Code available for payment source

## 2021-11-23 ENCOUNTER — Telehealth: Payer: Self-pay | Admitting: Allergy & Immunology

## 2021-11-23 DIAGNOSIS — J309 Allergic rhinitis, unspecified: Secondary | ICD-10-CM | POA: Diagnosis not present

## 2021-11-23 MED ORDER — PREDNISONE 10 MG PO TABS
10.0000 mg | ORAL_TABLET | Freq: Every day | ORAL | 1 refills | Status: DC | PRN
  Filled 2021-11-23: qty 30, 30d supply, fill #0
  Filled 2022-04-19: qty 30, 30d supply, fill #1

## 2021-11-23 NOTE — Telephone Encounter (Signed)
Patient contacted me.  She reports that she is having 3 to 4 days of malaise despite taking Benadryl 50 mg before her injections.  She is thinking about cutting back or stopping entirely, but they have helped her symptoms. ? ?She is very close to maintenance at 0.15 mL of her Red Vials.  This is her first set of red vials.  I would hate to backtrack since we have made up so much ground.  I recommended that she do prednisone 10 mg in addition to her Benadryl before her allergy shots. ? ?We will also space to every 2 weeks once she reaches 0.5 mL of her Red Vials. ? ?Confirmed pharmacy and sent in prednisone.  ?

## 2021-11-24 ENCOUNTER — Ambulatory Visit (INDEPENDENT_AMBULATORY_CARE_PROVIDER_SITE_OTHER): Payer: No Typology Code available for payment source | Admitting: Internal Medicine

## 2021-11-24 ENCOUNTER — Other Ambulatory Visit (HOSPITAL_COMMUNITY): Payer: Self-pay

## 2021-11-24 ENCOUNTER — Encounter: Payer: Self-pay | Admitting: Internal Medicine

## 2021-11-24 VITALS — BP 124/80 | HR 90 | Ht 66.0 in | Wt 212.6 lb

## 2021-11-24 DIAGNOSIS — R7303 Prediabetes: Secondary | ICD-10-CM

## 2021-11-24 DIAGNOSIS — E6609 Other obesity due to excess calories: Secondary | ICD-10-CM | POA: Diagnosis not present

## 2021-11-24 DIAGNOSIS — K219 Gastro-esophageal reflux disease without esophagitis: Secondary | ICD-10-CM | POA: Diagnosis not present

## 2021-11-24 DIAGNOSIS — I10 Essential (primary) hypertension: Secondary | ICD-10-CM

## 2021-11-24 DIAGNOSIS — E782 Mixed hyperlipidemia: Secondary | ICD-10-CM | POA: Insufficient documentation

## 2021-11-24 MED ORDER — SEMAGLUTIDE-WEIGHT MANAGEMENT 0.25 MG/0.5ML ~~LOC~~ SOAJ
0.2500 mg | SUBCUTANEOUS | 0 refills | Status: AC
  Filled 2021-11-24 – 2021-11-29 (×3): qty 2, 28d supply, fill #0
  Filled 2021-11-30: qty 2, 30d supply, fill #0

## 2021-11-24 MED ORDER — SEMAGLUTIDE-WEIGHT MANAGEMENT 1 MG/0.5ML ~~LOC~~ SOAJ
1.0000 mg | SUBCUTANEOUS | 0 refills | Status: DC
  Filled 2021-11-24 – 2021-12-26 (×2): qty 2, 28d supply, fill #0

## 2021-11-24 MED ORDER — SEMAGLUTIDE-WEIGHT MANAGEMENT 0.5 MG/0.5ML ~~LOC~~ SOAJ
0.5000 mg | SUBCUTANEOUS | 0 refills | Status: DC
  Filled 2021-11-24 – 2021-12-23 (×2): qty 2, 28d supply, fill #0

## 2021-11-24 NOTE — Assessment & Plan Note (Signed)
Well controlled with Prevacid 

## 2021-11-24 NOTE — Assessment & Plan Note (Addendum)
BMI Readings from Last 3 Encounters:  ?11/24/21 34.31 kg/m?  ?09/21/21 33.91 kg/m?  ?05/20/21 33.43 kg/m?  ? ?Advised to follow low-carb diet and perform moderate exercise at least 150 minutes/week ?Has prediabetes, HLD, HTN and GERD as well ?She is a good candidate for the GLP-1 agonist - started OMQTTC - initial BMI - 34.31 ?Increased dose of Wegovy as tolerated. ?

## 2021-11-24 NOTE — Assessment & Plan Note (Signed)
BP Readings from Last 1 Encounters:  ?11/24/21 124/80  ? ?Well controlled with diet modification ?Continue HCTZ 12.5 mg as needed for leg swelling ?Counseled for compliance with the medications ?Advised DASH diet and moderate exercise/walking, at least 150 mins/week ?

## 2021-11-24 NOTE — Assessment & Plan Note (Signed)
Lab Results  ?Component Value Date  ? HGBA1C 5.8 (H) 11/11/2021  ? ?Continue to follow low carb diet ?

## 2021-11-24 NOTE — Progress Notes (Signed)
? ?Established Patient Office Visit ? ?Subjective:  ?Patient ID: Theresa [name], female    DOB: 1970-11-13  Age: 51 y.o. MRN: [id number] ? ?CC:  ?Chief Complaint  ?Patient presents with  ? Follow-up  ?  Discuss weight management  ? ? ?HPI ?Theresa [name] is a 51 y.o. female with past medical history of GERD, chronic rhinitis, GAD, fibromyalgia and prediabetes who presents for f/u of her chronic medical conditions. ? ?She has been trying to follow low-carb diet, but has not been able to lose weight.  She has gained 2 pounds since the last visit.  Her blood tests show prediabetes and HLD as well.  She has history of GERD and depression with anxiety as well. ? ?She has been taking Celexa and Xanax as needed for anxiety.  Her sleep has improved now.  She still complains of severe fatigue and moderate muscle aches. She was diagnosed with fibromyalgia in 2019, but was not started on any medication at that time. She states that her symptoms are manageable currently. ? ? ? ?Past Medical History:  ?Diagnosis Date  ? Allergy   ? Allergy to alpha-gal   ? Anxiety   ? Colon adenoma DEC 2014  ? ONE(7 MM) SIMPLE([place])  ? Concussion with loss of consciousness 02/17/2019  ? Depression   ? Edema   ? GERD (gastroesophageal reflux disease)   ? History of abnormal cervical Pap smear 12/02/2013  ? History of nephrolithiasis   ? HPV (human papilloma virus) infection   ? Hyperlipidemia   ? IBS (irritable bowel syndrome)   ? constipation  ? Insomnia   ? Lymphadenopathy 07/12/2020  ? Obesity 01/14/2014  ? Positive test for Epstein-Barr virus (EBV) 08/21/2017  ? Pre-diabetes 09/2014  ? Superficial basal cell carcinoma (BCC) 12/26/2019  ? Right Breast- treatment Imiquimoid  ? Yeast infection 09/04/2013  ? ? ?Past Surgical History:  ?Procedure Laterality Date  ? BIOPSY N/A 08/01/2013  ? Procedure: BIOPSY;  Surgeon: Danie Binder, MD;  [facility];  Service: Endoscopy;  Laterality: N/A;  FOR MICROSCOPIC COLITIS  ? BIOPSY  08/09/2018  ? Procedure:  BIOPSY;  Surgeon: Rogene Houston, MD;  Location: [facility];  Service: Endoscopy;;  gastric  ? BREAST REDUCTION SURGERY  2009  ? BREAST SURGERY  2007  ? breast reduction  ? CHOLECYSTECTOMY N/A 11/04/2014  ? Procedure: LAPAROSCOPIC CHOLECYSTECTOMY;  Surgeon: Aviva Signs Md, MD;  Location: [facility];  Service: General;  Laterality: N/A;  ? COLONOSCOPY  Oct 2009  ? Dr. Carlean Purl: int/ext hemorrhoids, ?mild proctitis but path benign, likely r/t irritation from bowel prep  ? COLONOSCOPY N/A 08/01/2013  ? Procedure: COLONOSCOPY;  Surgeon: Danie Binder, MD;  Location: [facility];  Service: Endoscopy;  Laterality: N/A;  12:00  ? COLONOSCOPY WITH PROPOFOL N/A 08/09/2018  ? Procedure: COLONOSCOPY WITH PROPOFOL;  Surgeon: Rogene Houston, MD;  [facility]: [facility];  Service: Endoscopy;  Laterality: N/A;  730  ? ESOPHAGOGASTRODUODENOSCOPY  Dec 2009  ? Dr. Carlean Purl: reflux esophagitis, proximal gastric polyps, benign  ? ESOPHAGOGASTRODUODENOSCOPY N/A 12/18/2014  ? Procedure: ESOPHAGOGASTRODUODENOSCOPY (EGD);  Surgeon: Rogene Houston, MD;  [facility];  Service: Endoscopy;  Laterality: N/A;  230  ? ESOPHAGOGASTRODUODENOSCOPY (EGD) WITH PROPOFOL N/A 08/09/2018  ? Procedure: ESOPHAGOGASTRODUODENOSCOPY (EGD) WITH PROPOFOL;  Surgeon: Rogene Houston, MD;  [facility];  Service: Endoscopy;  Laterality: N/A;  ? HEMORRHOID BANDING N/A 08/01/2013  ? Procedure: HEMORRHOID BANDING;  Surgeon: Danie Binder,  MD;  [facility];  Service: Endoscopy;  Laterality: N/A;  internal hemorrhoid banding  ? LIVER BIOPSY N/A 11/04/2014  ? Procedure: LIVER BIOPSY;  Surgeon: Aviva Signs Md, MD;  Location: [facility];  Service: General;  Laterality: N/A;  ? POLYPECTOMY  2015  ? Dr.Fields  ? POLYPECTOMY  08/09/2018  ? Procedure: POLYPECTOMY;  Surgeon: Rogene Houston, MD;  [facility];  Service: Endoscopy;;  colon  ? REDUCTION MAMMAPLASTY Bilateral 2006  ? UPPER GASTROINTESTINAL ENDOSCOPY  2006  ? Dr.  Nita Sickle in [place]  ? ? ?Family History  ?Problem Relation Age of Onset  ? Cancer Mother   ?     head and neck  ? Hypertension Father   ? Hyperlipidemia Father   ? Pancreatic cancer Maternal Grandfather   ? Colon cancer Neg Hx   ? ? ?Social History  ? ?Socioeconomic History  ? Marital status: Married  ?  Spouse name: Not on file  ? Number of children: 1  ? Years of education: Not on file  ? Highest education level: Not on file  ?Occupational History  ? Occupation: Therapist, sports  ?  Employer: [employer]  ?Tobacco Use  ? Smoking status: Never  ? Smokeless tobacco: Never  ? Tobacco comments:  ?  Never smoker  ?Vaping Use  ? Vaping Use: Never used  ?Substance and Sexual Activity  ? Alcohol use: Yes  ?  Alcohol/week: 0.0 standard drinks  ?  Comment: socially  ? Drug use: No  ? Sexual activity: Yes  ?  Birth control/protection: I.U.D.  ?Other Topics Concern  ? Not on file  ?Social History Narrative  ? Not on file  ? ?Social Determinants of Health  ? ?Financial Resource Strain: Not on file  ?Food Insecurity: Not on file  ?Transportation Needs: Not on file  ?Physical Activity: Not on file  ?Stress: Not on file  ?Social Connections: Not on file  ?Intimate Partner Violence: Not on file  ? ? ?Outpatient Medications Prior to Visit  ?Medication Sig Dispense Refill  ? ALPRAZolam (XANAX) 1 MG tablet Take 1 tablet (1 mg total) by mouth at bedtime as needed for anxiety. 30 tablet 2  ? citalopram (CELEXA) 40 MG tablet Take 1 tablet (40 mg total) by mouth daily. 90 tablet 1  ? cycloSPORINE (RESTASIS) 0.05 % ophthalmic emulsion Place 1 drop into both eyes 2 (two) times daily. 180 mL 4  ? EPINEPHrine (EPIPEN 2-PAK) 0.3 mg/0.3 mL IJ SOAJ injection Inject 0.3 mLs (0.3 mg total) into the muscle as needed for anaphylaxis. 1 each 0  ? fluticasone (FLONASE) 50 MCG/ACT nasal spray Place 1 spray into both nostrils 2 (two) times daily as needed for allergies or rhinitis 16 g 11  ? hydrochlorothiazide (MICROZIDE) 12.5 MG capsule Take 1 capsule (12.5 mg  total) by mouth daily as needed. 30 capsule 2  ? ipratropium (ATROVENT) 0.06 % nasal spray Place 2 sprays into both nostrils 3 (three) times daily. 15 mL 11  ? lansoprazole (PREVACID) 30 MG capsule Take 1 capsule (30 mg total) by mouth daily at 12 noon. 90 capsule 3  ? levocetirizine (XYZAL) 5 MG tablet Take 1 tablet (5 mg total) by mouth every evening. 30 tablet 11  ? levonorgestrel (MIRENA) 20 MCG/24HR IUD 1 each by Intrauterine route once.    ? linaclotide (LINZESS) 72 MCG capsule Take 1 capsule (72 mcg total) by mouth daily before breakfast. 30 capsule 2  ? predniSONE (DELTASONE) 10 MG tablet Take 1  tablet (10 mg total) by mouth daily as needed for up to 30 doses. Take one tablet one hour before allergy shots. 30 tablet 1  ? valACYclovir (VALTREX) 1000 MG tablet Take 1,000 mg by mouth daily.    ? cephALEXin (KEFLEX) 500 MG capsule Take 1 capsule (500 mg total) by mouth 4 (four) times daily. 20 capsule 0  ? phenazopyridine (PYRIDIUM) 200 MG tablet Take 1 tablet (200 mg total) by mouth 3 (three) times daily. 6 tablet 0  ? senna (SENOKOT) 8.6 MG tablet Take 1 tablet (8.6 mg total) by mouth daily. 30 tablet 0  ? ?No facility-administered medications prior to visit.  ? ? ?Allergies  ?Allergen Reactions  ? Heparin (Porcine) Other (See Comments)  ? ? ?ROS ?Review of Systems  ?Constitutional:  Positive for fatigue. Negative for chills and fever.  ?HENT:  Negative for congestion, sinus pressure, sinus pain and sore throat.   ?Eyes:  Negative for pain and discharge.  ?Respiratory:  Negative for cough and shortness of breath.   ?Cardiovascular:  Positive for leg swelling. Negative for chest pain and palpitations.  ?Gastrointestinal:  Positive for constipation. Negative for abdominal pain, nausea and vomiting.  ?Endocrine: Negative for polydipsia and polyuria.  ?Genitourinary:  Negative for dysuria and hematuria.  ?Musculoskeletal:  Positive for arthralgias and myalgias. Negative for neck stiffness.  ?Skin:  Negative for  rash.  ?Allergic/Immunologic: Positive for environmental allergies.  ?Neurological:  Negative for dizziness and weakness.  ?Psychiatric/Behavioral:  Positive for sleep disturbance. Negative for agitation and b

## 2021-11-24 NOTE — Assessment & Plan Note (Signed)
Lipid profile reviewed ?Advised to follow low-carb and low-cholesterol diet for now ?With weight loss from GLP-1 agonist, her lipid profile should improve as well ?

## 2021-11-24 NOTE — Patient Instructions (Signed)
Please start taking Wegovy as prescribed. ? ?Please follow small, frequent meals to avoid nausea. ? ?Please continue to follow low carb diet and perform moderate exercise/walking at least 150 mins/week. ?

## 2021-11-25 ENCOUNTER — Other Ambulatory Visit (HOSPITAL_COMMUNITY): Payer: Self-pay

## 2021-11-28 ENCOUNTER — Encounter: Payer: Self-pay | Admitting: Allergy & Immunology

## 2021-11-29 ENCOUNTER — Other Ambulatory Visit: Payer: Self-pay | Admitting: *Deleted

## 2021-11-29 ENCOUNTER — Other Ambulatory Visit (HOSPITAL_COMMUNITY): Payer: Self-pay

## 2021-11-29 ENCOUNTER — Telehealth: Payer: Self-pay | Admitting: *Deleted

## 2021-11-29 MED ORDER — RYVENT 6 MG PO TABS
6.0000 mg | ORAL_TABLET | Freq: Three times a day (TID) | ORAL | 5 refills | Status: DC | PRN
  Filled 2021-11-29 – 2021-11-30 (×2): qty 90, 30d supply, fill #0

## 2021-11-29 NOTE — Telephone Encounter (Signed)
I contacted the patient.  She reports that she never had these reactions until she reached the red vial.  I offered her the option of going back to the green vial 0.5 mL as a maintenance.  She would like to do this, at least for now.  We will plan to do this every 2 weeks.  She is open to increasing back to the red vial, but may be not until he gets colder later in 2023. ? ?Salvatore Marvel, MD ?[facility] ? ?

## 2021-11-29 NOTE — Telephone Encounter (Signed)
PA has been submitted through CoverMyMeds for Ryvent and is currently pending approval/denial.  ?

## 2021-11-30 ENCOUNTER — Encounter: Payer: Self-pay | Admitting: Internal Medicine

## 2021-11-30 ENCOUNTER — Other Ambulatory Visit (HOSPITAL_COMMUNITY): Payer: Self-pay

## 2021-11-30 ENCOUNTER — Encounter: Payer: Self-pay | Admitting: *Deleted

## 2021-11-30 NOTE — Telephone Encounter (Signed)
PA is still pending.  

## 2021-12-01 ENCOUNTER — Other Ambulatory Visit: Payer: Self-pay | Admitting: *Deleted

## 2021-12-01 NOTE — Telephone Encounter (Signed)
PA was denied stating that Carbinoxamine '4mg'$  is preferred, is it ok to switch to this medication?  ?

## 2021-12-02 ENCOUNTER — Other Ambulatory Visit (HOSPITAL_COMMUNITY): Payer: Self-pay

## 2021-12-02 ENCOUNTER — Other Ambulatory Visit: Payer: Self-pay | Admitting: *Deleted

## 2021-12-02 MED ORDER — CARBINOXAMINE MALEATE 4 MG PO TABS
4.0000 mg | ORAL_TABLET | Freq: Two times a day (BID) | ORAL | 5 refills | Status: DC | PRN
  Filled 2021-12-02: qty 46, 23d supply, fill #0
  Filled 2021-12-06: qty 14, 7d supply, fill #0
  Filled 2022-01-29: qty 60, 30d supply, fill #1
  Filled 2022-05-23: qty 16, 8d supply, fill #2
  Filled 2022-05-24: qty 44, 22d supply, fill #2
  Filled 2022-06-28: qty 60, 30d supply, fill #2
  Filled 2022-09-12: qty 60, 30d supply, fill #3
  Filled 2022-11-13: qty 60, 30d supply, fill #4

## 2021-12-02 NOTE — Telephone Encounter (Signed)
Prescription has been sent in. Sent MyChart message advising patient.  ?

## 2021-12-02 NOTE — Telephone Encounter (Signed)
Let's do BID PRN. ? ?Salvatore Marvel, MD ?[facility] ? ?

## 2021-12-02 NOTE — Telephone Encounter (Signed)
Sure that is fine.   Lupie Sawa, MD [facility]  

## 2021-12-02 NOTE — Telephone Encounter (Signed)
Would it be just once a day or more often?  ?

## 2021-12-06 ENCOUNTER — Other Ambulatory Visit (HOSPITAL_COMMUNITY): Payer: Self-pay

## 2021-12-06 NOTE — Telephone Encounter (Signed)
Left a message for Alyce on her voicemail telling her to take prednisone '10mg'$  along with her benadryl before her injections. ? ?Azaela [phone] ?

## 2021-12-08 ENCOUNTER — Other Ambulatory Visit (HOSPITAL_COMMUNITY): Payer: Self-pay

## 2021-12-12 ENCOUNTER — Encounter: Payer: Self-pay | Admitting: Internal Medicine

## 2021-12-12 ENCOUNTER — Ambulatory Visit (INDEPENDENT_AMBULATORY_CARE_PROVIDER_SITE_OTHER): Payer: No Typology Code available for payment source | Admitting: Internal Medicine

## 2021-12-12 DIAGNOSIS — N200 Calculus of kidney: Secondary | ICD-10-CM

## 2021-12-12 DIAGNOSIS — N3000 Acute cystitis without hematuria: Secondary | ICD-10-CM | POA: Diagnosis not present

## 2021-12-12 LAB — POCT URINALYSIS DIP (CLINITEK)
Bilirubin, UA: NEGATIVE
Glucose, UA: NEGATIVE mg/dL
Ketones, POC UA: NEGATIVE mg/dL
Leukocytes, UA: NEGATIVE
Nitrite, UA: NEGATIVE
POC PROTEIN,UA: NEGATIVE
Spec Grav, UA: 1.015 (ref 1.010–1.025)
Urobilinogen, UA: 0.2 E.U./dL
pH, UA: 5.5 (ref 5.0–8.0)

## 2021-12-12 MED ORDER — SULFAMETHOXAZOLE-TRIMETHOPRIM 800-160 MG PO TABS: 1.0000 | ORAL_TABLET | Freq: Two times a day (BID) | ORAL | 0 refills | Status: DC

## 2021-12-12 NOTE — Telephone Encounter (Signed)
Called patient will drop off urnine at 4:00 and Appt scheduled virtual for 4:40 ?

## 2021-12-12 NOTE — Progress Notes (Signed)
?  ? ?Virtual Visit via Telephone Note  ? ?This visit type was conducted due to national recommendations for restrictions regarding the COVID-19 Pandemic (e.g. social distancing) in an effort to limit this patient's exposure and mitigate transmission in our community.  Due to her co-morbid illnesses, this patient is at least at moderate risk for complications without adequate follow up.  This format is felt to be most appropriate for this patient at this time.  The patient did not have access to video technology/had technical difficulties with video requiring transitioning to audio format only (telephone).  All issues noted in this document were discussed and addressed.  No physical exam could be performed with this format. ? ?Evaluation Performed:  Follow-up visit ? ?Date:  12/12/2021  ? ?ID:  Theresa [name], DOB 03-22-1971, MRN [id number] ? ?Patient Location: Home ?Provider Location: Office/Clinic ? ?Participants: Patient ?Location of Patient: Home ?Location of Provider: Telehealth ?Consent was obtain for visit to be over via telehealth. ?I verified that I am speaking with the correct person using two identifiers. ? ?PCP:  Theresa Spar, MD  ? ?Chief Complaint: Urinary frequency and lower abdominal pain ? ?History of Present Illness:   ? ?Theresa [name] is a 51 y.o. female who has a televisit for complaint of urinary frequency and lower abdominal pressure for the last 5 days.  She denies any fever, chills, nausea or vomiting currently.  She reports history of kidney stones noted on CT abdomen in the past.  She currently denies any flank or pelvic pain.  Denies any vaginal discharge or abnormal bleeding. ? ?The patient does not have symptoms concerning for COVID-19 infection (fever, chills, cough, or new shortness of breath).  ? ?Past Medical, Surgical, Social History, Allergies, and Medications have been Reviewed. ? ?Past Medical History:  ?Diagnosis Date  ? Allergy   ? Allergy to alpha-gal   ? Anxiety   ? Colon adenoma DEC  2014  ? ONE(7 MM) SIMPLE([place])  ? Concussion with loss of consciousness 02/17/2019  ? Depression   ? Edema   ? GERD (gastroesophageal reflux disease)   ? History of abnormal cervical Pap smear 12/02/2013  ? History of nephrolithiasis   ? HPV (human papilloma virus) infection   ? Hyperlipidemia   ? IBS (irritable bowel syndrome)   ? constipation  ? Insomnia   ? Lymphadenopathy 07/12/2020  ? Obesity 01/14/2014  ? Positive test for Epstein-Barr virus (EBV) 08/21/2017  ? Pre-diabetes 09/2014  ? Superficial basal cell carcinoma (BCC) 12/26/2019  ? Right Breast- treatment Imiquimoid  ? Yeast infection 09/04/2013  ? ?Past Surgical History:  ?Procedure Laterality Date  ? BIOPSY N/A 08/01/2013  ? Procedure: BIOPSY;  Surgeon: Danie Binder, MD;  [facility];  Service: Endoscopy;  Laterality: N/A;  FOR MICROSCOPIC COLITIS  ? BIOPSY  08/09/2018  ? Procedure: BIOPSY;  Surgeon: Rogene Houston, MD;  [facility];  Service: Endoscopy;;  gastric  ? BREAST REDUCTION SURGERY  2009  ? BREAST SURGERY  2007  ? breast reduction  ? CHOLECYSTECTOMY N/A 11/04/2014  ? Procedure: LAPAROSCOPIC CHOLECYSTECTOMY;  Surgeon: Aviva Signs Md, MD;  Location: [facility];  Service: General;  Laterality: N/A;  ? COLONOSCOPY  Oct 2009  ? Dr. Carlean Purl: int/ext hemorrhoids, ?mild proctitis but path benign, likely r/t irritation from bowel prep  ? COLONOSCOPY N/A 08/01/2013  ? Procedure: COLONOSCOPY;  Surgeon: Danie Binder, MD;  [facility];  Service: Endoscopy;  Laterality: N/A;  12:00  ?  COLONOSCOPY WITH PROPOFOL N/A 08/09/2018  ? Procedure: COLONOSCOPY WITH PROPOFOL;  Surgeon: Rogene Houston, MD;  [facility];  Service: Endoscopy;  Laterality: N/A;  730  ? ESOPHAGOGASTRODUODENOSCOPY  Dec 2009  ? Dr. Carlean Purl: reflux esophagitis, proximal gastric polyps, benign  ? ESOPHAGOGASTRODUODENOSCOPY N/A 12/18/2014  ? Procedure: ESOPHAGOGASTRODUODENOSCOPY (EGD);  Surgeon: Rogene Houston, MD;  [facility];  Service:  Endoscopy;  Laterality: N/A;  230  ? ESOPHAGOGASTRODUODENOSCOPY (EGD) WITH PROPOFOL N/A 08/09/2018  ? Procedure: ESOPHAGOGASTRODUODENOSCOPY (EGD) WITH PROPOFOL;  Surgeon: Rogene Houston, MD;  [facility];  Service: Endoscopy;  Laterality: N/A;  ? HEMORRHOID BANDING N/A 08/01/2013  ? Procedure: HEMORRHOID BANDING;  Surgeon: Danie Binder, MD;  [facility];  Service: Endoscopy;  Laterality: N/A;  internal hemorrhoid banding  ? LIVER BIOPSY N/A 11/04/2014  ? Procedure: LIVER BIOPSY;  Surgeon: Aviva Signs Md, MD;  [facility]: [facility];  Service: General;  Laterality: N/A;  ? POLYPECTOMY  2015  ? Dr.Fields  ? POLYPECTOMY  08/09/2018  ? Procedure: POLYPECTOMY;  Surgeon: Rogene Houston, MD;  [facility];  Service: Endoscopy;;  colon  ? REDUCTION MAMMAPLASTY Bilateral 2006  ? UPPER GASTROINTESTINAL ENDOSCOPY  2006  ? Dr. Nita Sickle in [place]  ?  ? ?No outpatient medications have been marked as taking for the 12/12/21 encounter (Office Visit) with Theresa Spar, MD.  ?  ? ?Allergies:   Heparin (porcine)  ? ?ROS:   ?Please see the history of present illness.    ? ?All other systems reviewed and are negative. ? ? ?Labs/Other Tests and Data Reviewed:   ? ?Recent Labs: ?11/11/2021: ALT 25; BUN 11; Creatinine, Ser 0.85; Hemoglobin 13.6; Platelets 299; Potassium 4.1; Sodium 135; TSH 1.820  ? ?Recent Lipid Panel ?Lab Results  ?Component Value Date/Time  ? CHOL 257 (H) 11/11/2021 12:00 AM  ? TRIG 166 (H) 11/11/2021 12:00 AM  ? HDL 46 11/11/2021 12:00 AM  ? CHOLHDL 5.6 (H) 11/11/2021 12:00 AM  ? LDLCALC 180 (H) 11/11/2021 12:00 AM  ? ? ?Wt Readings from Last 3 Encounters:  ?11/24/21 212 lb 9.6 oz (96.4 kg)  ?09/21/21 210 lb 1.3 oz (95.3 kg)  ?05/20/21 207 lb 1.9 oz (93.9 kg)  ?  ? ? ?ASSESSMENT & PLAN:   ? ?UTI ?UA reviewed-trace RBCs ?Considering her symptoms, will start empiric Bactrim for now ?Advised to increase fluid intake ?She has history of nonobstructive nephrolithiasis, less likely to  be causing her current symptoms, but if she has persistent symptoms, will get US renal ? ?Time:   ?Today, I have spent 9 minutes reviewing the chart, including problem list, medications, and with the patient with telehealth technology discussing the above problems. ? ? ?Medication Adjustments/Labs and Tests Ordered: ?Current medicines are reviewed at length with the patient today.  Concerns regarding medicines are outlined above.  ? ?Tests Ordered: ?Orders Placed This Encounter  ?Procedures  ? POCT URINALYSIS DIP (CLINITEK)  ? ? ?Medication Changes: ?No orders of the defined types were placed in this encounter. ? ? ? ?Note: This dictation was prepared with Dragon dictation along with smaller phrase technology. Similar sounding words can be transcribed inadequately or may not be corrected upon review. Any transcriptional errors that result from this process are unintentional.  ?  ? ? ?Disposition:  Follow up  ?Signed, ?Theresa Spar, MD  ?12/12/2021 4:51 PM    ? ?[facility] ?[facility] ?

## 2021-12-14 ENCOUNTER — Encounter: Payer: Self-pay | Admitting: Internal Medicine

## 2021-12-14 ENCOUNTER — Other Ambulatory Visit: Payer: Self-pay | Admitting: Internal Medicine

## 2021-12-14 DIAGNOSIS — R310 Gross hematuria: Secondary | ICD-10-CM

## 2021-12-14 DIAGNOSIS — N200 Calculus of kidney: Secondary | ICD-10-CM

## 2021-12-15 ENCOUNTER — Encounter: Payer: Self-pay | Admitting: Internal Medicine

## 2021-12-15 NOTE — Telephone Encounter (Signed)
Spoke with patient got [place] set up for 12-16-21 ? ?Advised to go to ER for hematuria with verbal understanding  ?

## 2021-12-16 ENCOUNTER — Ambulatory Visit (HOSPITAL_COMMUNITY): Payer: No Typology Code available for payment source

## 2021-12-20 ENCOUNTER — Ambulatory Visit: Payer: No Typology Code available for payment source | Admitting: Internal Medicine

## 2021-12-22 ENCOUNTER — Ambulatory Visit (HOSPITAL_COMMUNITY): Payer: No Typology Code available for payment source

## 2021-12-23 ENCOUNTER — Other Ambulatory Visit (HOSPITAL_COMMUNITY): Payer: Self-pay

## 2021-12-23 ENCOUNTER — Encounter: Payer: Self-pay | Admitting: Internal Medicine

## 2021-12-26 ENCOUNTER — Other Ambulatory Visit (HOSPITAL_COMMUNITY): Payer: Self-pay

## 2021-12-29 ENCOUNTER — Other Ambulatory Visit (HOSPITAL_COMMUNITY): Payer: Self-pay

## 2022-01-02 ENCOUNTER — Other Ambulatory Visit (HOSPITAL_COMMUNITY): Payer: Self-pay

## 2022-01-11 ENCOUNTER — Other Ambulatory Visit (HOSPITAL_COMMUNITY): Payer: Self-pay

## 2022-01-11 ENCOUNTER — Other Ambulatory Visit: Payer: Self-pay | Admitting: Internal Medicine

## 2022-01-11 ENCOUNTER — Encounter: Payer: Self-pay | Admitting: Internal Medicine

## 2022-01-11 DIAGNOSIS — E6609 Other obesity due to excess calories: Secondary | ICD-10-CM

## 2022-01-11 MED ORDER — SEMAGLUTIDE-WEIGHT MANAGEMENT 2.4 MG/0.75ML ~~LOC~~ SOAJ
2.4000 mg | SUBCUTANEOUS | 3 refills | Status: DC
  Filled 2022-01-11: qty 3, fill #0
  Filled 2022-02-15: qty 3, 28d supply, fill #0
  Filled 2022-03-08 (×2): qty 3, 28d supply, fill #1
  Filled 2022-04-05: qty 3, 28d supply, fill #2
  Filled 2022-05-03: qty 3, 28d supply, fill #3

## 2022-01-11 MED ORDER — SEMAGLUTIDE-WEIGHT MANAGEMENT 1.7 MG/0.75ML ~~LOC~~ SOAJ
1.7000 mg | SUBCUTANEOUS | 0 refills | Status: AC
  Filled 2022-01-11: qty 3, 28d supply, fill #0

## 2022-01-23 ENCOUNTER — Other Ambulatory Visit (HOSPITAL_COMMUNITY): Payer: Self-pay

## 2022-01-23 ENCOUNTER — Other Ambulatory Visit: Payer: Self-pay | Admitting: Internal Medicine

## 2022-01-23 DIAGNOSIS — F419 Anxiety disorder, unspecified: Secondary | ICD-10-CM

## 2022-01-23 DIAGNOSIS — F411 Generalized anxiety disorder: Secondary | ICD-10-CM

## 2022-01-23 MED ORDER — ALPRAZOLAM 1 MG PO TABS
1.0000 mg | ORAL_TABLET | Freq: Every evening | ORAL | 2 refills | Status: DC | PRN
  Filled 2022-01-29: qty 30, 30d supply, fill #0

## 2022-01-23 MED ORDER — CITALOPRAM HYDROBROMIDE 40 MG PO TABS
40.0000 mg | ORAL_TABLET | Freq: Every day | ORAL | 1 refills | Status: DC
  Filled 2022-01-23: qty 90, 90d supply, fill #0
  Filled 2022-04-04 – 2022-04-19 (×2): qty 90, 90d supply, fill #1

## 2022-01-30 ENCOUNTER — Other Ambulatory Visit (HOSPITAL_COMMUNITY): Payer: Self-pay

## 2022-01-31 ENCOUNTER — Other Ambulatory Visit (HOSPITAL_COMMUNITY): Payer: Self-pay

## 2022-02-01 ENCOUNTER — Other Ambulatory Visit (HOSPITAL_COMMUNITY): Payer: Self-pay

## 2022-02-01 ENCOUNTER — Telehealth: Payer: Self-pay | Admitting: Internal Medicine

## 2022-02-01 NOTE — Telephone Encounter (Signed)
Theresa [name] with [name] called Needs verbal orders on  ALPRAZolam Theresa [name]) 1 MG tablet  No signature on escript   Call back [phone]

## 2022-02-02 ENCOUNTER — Other Ambulatory Visit (HOSPITAL_COMMUNITY): Payer: Self-pay

## 2022-02-05 ENCOUNTER — Encounter: Payer: Self-pay | Admitting: Internal Medicine

## 2022-02-06 NOTE — Telephone Encounter (Signed)
Called patient to schedule an appointment, on vacation, patient said she will wait til July next appt.

## 2022-02-15 ENCOUNTER — Other Ambulatory Visit (HOSPITAL_COMMUNITY): Payer: Self-pay

## 2022-02-23 ENCOUNTER — Ambulatory Visit: Payer: No Typology Code available for payment source | Admitting: Internal Medicine

## 2022-02-24 ENCOUNTER — Encounter: Payer: Self-pay | Admitting: Internal Medicine

## 2022-02-24 ENCOUNTER — Ambulatory Visit (INDEPENDENT_AMBULATORY_CARE_PROVIDER_SITE_OTHER): Payer: No Typology Code available for payment source | Admitting: Internal Medicine

## 2022-02-24 ENCOUNTER — Other Ambulatory Visit (HOSPITAL_COMMUNITY): Payer: Self-pay

## 2022-02-24 VITALS — BP 124/82 | HR 96 | Resp 18 | Ht 66.0 in | Wt 183.0 lb

## 2022-02-24 DIAGNOSIS — R1031 Right lower quadrant pain: Secondary | ICD-10-CM | POA: Diagnosis not present

## 2022-02-24 DIAGNOSIS — I10 Essential (primary) hypertension: Secondary | ICD-10-CM

## 2022-02-24 DIAGNOSIS — E782 Mixed hyperlipidemia: Secondary | ICD-10-CM

## 2022-02-24 DIAGNOSIS — K581 Irritable bowel syndrome with constipation: Secondary | ICD-10-CM

## 2022-02-24 DIAGNOSIS — E559 Vitamin D deficiency, unspecified: Secondary | ICD-10-CM

## 2022-02-24 DIAGNOSIS — M797 Fibromyalgia: Secondary | ICD-10-CM | POA: Diagnosis not present

## 2022-02-24 DIAGNOSIS — Z0000 Encounter for general adult medical examination without abnormal findings: Secondary | ICD-10-CM

## 2022-02-24 DIAGNOSIS — E6609 Other obesity due to excess calories: Secondary | ICD-10-CM | POA: Diagnosis not present

## 2022-02-24 DIAGNOSIS — E66811 Obesity, class 1: Secondary | ICD-10-CM

## 2022-02-24 MED ORDER — TRULANCE 3 MG PO TABS
1.0000 | ORAL_TABLET | Freq: Every day | ORAL | 0 refills | Status: DC
  Filled 2022-02-24: qty 30, 30d supply, fill #0

## 2022-02-24 NOTE — Assessment & Plan Note (Signed)
Check lipid profile Advised to follow low-carb and low-cholesterol diet for now With weight loss from GLP-1 agonist, her lipid profile should improve as well

## 2022-02-24 NOTE — Assessment & Plan Note (Signed)
Chronic RLQ abdominal pain, with intermittent flareups Check CT abdomen Previous CT abdomen was negative for any acute pathology

## 2022-02-24 NOTE — Progress Notes (Signed)
Established Patient Office Visit  Subjective:  Patient ID: Theresa [name], female    DOB: 1970-08-26  Age: 51 y.o. MRN: [id number]  CC:  Chief Complaint  Patient presents with   Follow-up    3 month follow up weight management also pt sent a mychart about pain she was having that comes and goes thinks it may be appendix     HPI Theresa [name] is a 51 y.o. female with past medical history of GERD, chronic rhinitis, GAD, fibromyalgia and prediabetes who presents for f/u of her chronic medical conditions.  She complains of chronic RLQ abdominal pain, which is intermittent.  She has had ER visit for acute RLQ abdominal pain, but her CT abdomen was negative for any acute pathology except showing constipation/stool burden.  She has had spells of severe RLQ abdominal pain, which is sharp, nonradiating.  She currently denies melena or hematochezia.  She uses to take Linzess for IBS-C, but did not work for her.  She has tried Colace, MiraLAX, senna and Amitiza in the past.  Obesity: She has lost about 29 pounds since the last visit.  She is tolerating Wegovy well except mild nausea. Her leg swelling and myalgias are also improving with weight loss.  She has been taking Celexa and Xanax as needed for anxiety.  Her sleep has improved now.   Past Medical History:  Diagnosis Date   Allergy    Allergy to alpha-gal    Anxiety    Colon adenoma DEC 2014   ONE(7 MM) SIMPLE([place])   Concussion with loss of consciousness 02/17/2019   Depression    Edema    GERD (gastroesophageal reflux disease)    History of abnormal cervical Pap smear 12/02/2013   History of nephrolithiasis    HPV (human papilloma virus) infection    Hyperlipidemia    IBS (irritable bowel syndrome)    constipation   Insomnia    Lymphadenopathy 07/12/2020   Obesity 01/14/2014   Positive test for Epstein-Barr virus (EBV) 08/21/2017   Pre-diabetes 09/2014   Superficial basal cell carcinoma (BCC) 12/26/2019   Right Breast- treatment Imiquimoid    Yeast infection 09/04/2013    Past Surgical History:  Procedure Laterality Date   BIOPSY N/A 08/01/2013   Procedure: BIOPSY;  Surgeon: Danie Binder, MD;  [facility];  Service: Endoscopy;  Laterality: N/A;  FOR MICROSCOPIC COLITIS   BIOPSY  08/09/2018   Procedure: BIOPSY;  Surgeon: Rogene Houston, MD;  [facility];  Service: Endoscopy;;  gastric   BREAST REDUCTION SURGERY  2009   BREAST SURGERY  2007   breast reduction   CHOLECYSTECTOMY N/A 11/04/2014   Procedure: LAPAROSCOPIC CHOLECYSTECTOMY;  Surgeon: Aviva Signs Md, MD;  Location: [facility];  Service: General;  Laterality: N/A;   COLONOSCOPY  Oct 2009   Dr. Carlean Purl: int/ext hemorrhoids, ?mild proctitis but path benign, likely r/t irritation from bowel prep   COLONOSCOPY N/A 08/01/2013   Procedure: COLONOSCOPY;  Surgeon: Danie Binder, MD;  [facility];  Service: Endoscopy;  Laterality: N/A;  12:00   COLONOSCOPY WITH PROPOFOL N/A 08/09/2018   Procedure: COLONOSCOPY WITH PROPOFOL;  Surgeon: Rogene Houston, MD;  [facility];  Service: Endoscopy;  Laterality: N/A;  730   ESOPHAGOGASTRODUODENOSCOPY  Dec 2009   Dr. Carlean Purl: reflux esophagitis, proximal gastric polyps, benign   ESOPHAGOGASTRODUODENOSCOPY N/A 12/18/2014   Procedure: ESOPHAGOGASTRODUODENOSCOPY (EGD);  Surgeon: Rogene Houston, MD;  [facility];  Service: Endoscopy;  Laterality: N/A;  230   ESOPHAGOGASTRODUODENOSCOPY (EGD) WITH PROPOFOL N/A 08/09/2018   Procedure: ESOPHAGOGASTRODUODENOSCOPY (EGD) WITH PROPOFOL;  Surgeon: Rogene Houston, MD;  [facility];  Service: Endoscopy;  Laterality: N/A;   HEMORRHOID BANDING N/A 08/01/2013   Procedure: HEMORRHOID BANDING;  Surgeon: Danie Binder, MD;  [facility];  Service: Endoscopy;  Laterality: N/A;  internal hemorrhoid banding   LIVER BIOPSY N/A 11/04/2014   Procedure: LIVER BIOPSY;  Surgeon: Aviva Signs Md, MD;  Location: [facility];  Service:  General;  Laterality: N/A;   POLYPECTOMY  2015   Dr.Fields   POLYPECTOMY  08/09/2018   Procedure: POLYPECTOMY;  Surgeon: Rogene Houston, MD;  [facility];  Service: Endoscopy;;  colon   REDUCTION MAMMAPLASTY Bilateral 2006   UPPER GASTROINTESTINAL ENDOSCOPY  2006   Dr. Nita Sickle in [place]    Family History  Problem Relation Age of Onset   Cancer Mother        head and neck   Hypertension Father    Hyperlipidemia Father    Pancreatic cancer Maternal Grandfather    Colon cancer Neg Hx     Social History   Socioeconomic History   Marital status: Married    Spouse name: Not on file   Number of children: 1   Years of education: Not on file   Highest education level: Not on file  Occupational History   Occupation: Therapist, sports    Employer: [employer]  Tobacco Use   Smoking status: Never   Smokeless tobacco: Never   Tobacco comments:    Never smoker  Vaping Use   Vaping Use: Never used  Substance and Sexual Activity   Alcohol use: Yes    Alcohol/week: 0.0 standard drinks of alcohol    Comment: socially   Drug use: No   Sexual activity: Yes    Birth control/protection: I.U.D.  Other Topics Concern   Not on file  Social History Narrative   Not on file   Social Determinants of Health   Financial Resource Strain: Not on file  Food Insecurity: Not on file  Transportation Needs: Not on file  Physical Activity: Not on file  Stress: Not on file  Social Connections: Not on file  Intimate Partner Violence: Not on file    Outpatient Medications Prior to Visit  Medication Sig Dispense Refill   ALPRAZolam (XANAX) 1 MG tablet Take 1 tablet by mouth at bedtime as needed for anxiety. 30 tablet 2   Carbinoxamine Maleate 4 MG TABS Take 1 tablet by mouth 2 times daily as needed. 60 tablet 5   citalopram (CELEXA) 40 MG tablet Take 1 tablet (40 mg total) by mouth daily. 90 tablet 1   cycloSPORINE (RESTASIS) 0.05 % ophthalmic emulsion Place 1 drop into both eyes 2 (two)  times daily. 180 mL 4   EPINEPHrine (EPIPEN 2-PAK) 0.3 mg/0.3 mL IJ SOAJ injection Inject 0.3 mLs (0.3 mg total) into the muscle as needed for anaphylaxis. 1 each 0   fluticasone (FLONASE) 50 MCG/ACT nasal spray Place 1 spray into both nostrils 2 (two) times daily as needed for allergies or rhinitis 16 g 11   hydrochlorothiazide (MICROZIDE) 12.5 MG capsule Take 1 capsule (12.5 mg total) by mouth daily as needed. 30 capsule 2   ipratropium (ATROVENT) 0.06 % nasal spray Place 2 sprays into both nostrils 3 (three) times daily. 15 mL 11   lansoprazole (PREVACID) 30 MG capsule Take 1 capsule (30 mg total) by mouth daily  at 12 noon. 90 capsule 3   levonorgestrel (MIRENA) 20 MCG/24HR IUD 1 each by Intrauterine route once.     predniSONE (DELTASONE) 10 MG tablet Take 1 tablet (10 mg total) by mouth daily as needed for up to 30 doses. Take one tablet one hour before allergy shots. 30 tablet 1   Semaglutide-Weight Management 2.4 MG/0.75ML SOAJ Inject 2.4 mg into the skin once a week. 3 mL 3   valACYclovir (VALTREX) 1000 MG tablet Take 1,000 mg by mouth daily.     linaclotide (LINZESS) 72 MCG capsule Take 1 capsule (72 mcg total) by mouth daily before breakfast. 30 capsule 2   levocetirizine (XYZAL) 5 MG tablet Take 1 tablet (5 mg total) by mouth every evening. 30 tablet 11   sulfamethoxazole-trimethoprim (BACTRIM DS) 800-160 MG tablet Take 1 tablet by mouth 2 (two) times daily. 6 tablet 0   No facility-administered medications prior to visit.    Allergies  Allergen Reactions   Heparin (Porcine) Other (See Comments)    ROS Review of Systems  Constitutional:  Positive for fatigue. Negative for chills and fever.  HENT:  Negative for congestion, sinus pressure, sinus pain and sore throat.   Eyes:  Negative for pain and discharge.  Respiratory:  Negative for cough and shortness of breath.   Cardiovascular:  Negative for chest pain and palpitations.  Gastrointestinal:  Positive for constipation.  Negative for abdominal pain, nausea and vomiting.  Endocrine: Negative for polydipsia and polyuria.  Genitourinary:  Negative for dysuria and hematuria.  Musculoskeletal:  Positive for arthralgias and myalgias. Negative for neck stiffness.  Skin:  Negative for rash.  Allergic/Immunologic: Positive for environmental allergies.  Neurological:  Negative for dizziness and weakness.  Psychiatric/Behavioral:  Positive for sleep disturbance. Negative for agitation and behavioral problems. The patient is nervous/anxious.       Objective:    Physical Exam Vitals reviewed.  Constitutional:      General: She is not in acute distress.    Appearance: She is not diaphoretic.  HENT:     Head: Normocephalic and atraumatic.     Nose: Nose normal.     Mouth/Throat:     Mouth: Mucous membranes are moist.  Eyes:     General: No scleral icterus.    Extraocular Movements: Extraocular movements intact.  Cardiovascular:     Rate and Rhythm: Normal rate and regular rhythm.     Pulses: Normal pulses.     Heart sounds: Normal heart sounds. No murmur heard. Pulmonary:     Breath sounds: Normal breath sounds. No wheezing or rales.  Abdominal:     Palpations: Abdomen is soft.     Tenderness: There is abdominal tenderness (Mild RLQ). There is no guarding or rebound.  Musculoskeletal:     Cervical back: Neck supple. No tenderness.     Right lower leg: No edema.     Left lower leg: No edema.  Skin:    General: Skin is warm.     Findings: No rash.  Neurological:     General: No focal deficit present.     Mental Status: She is alert and oriented to person, place, and time.     Sensory: No sensory deficit.     Motor: No weakness.  Psychiatric:        Mood and Affect: Mood normal.        Behavior: Behavior normal.     BP 124/82 (BP Location: Right Arm, Patient Position: Sitting, Cuff Size: Normal)   Pulse  96   Resp 18   Ht '5\' 6"'  (1.676 m)   Wt 183 lb (83 kg)   SpO2 99%   BMI 29.54 kg/m  Wt  Readings from Last 3 Encounters:  02/24/22 183 lb (83 kg)  11/24/21 212 lb 9.6 oz (96.4 kg)  09/21/21 210 lb 1.3 oz (95.3 kg)    Lab Results  Component Value Date   TSH 1.820 11/11/2021   Lab Results  Component Value Date   WBC 8.2 11/11/2021   HGB 13.6 11/11/2021   HCT 41.0 11/11/2021   MCV 89 11/11/2021   PLT 299 11/11/2021   Lab Results  Component Value Date   NA 135 11/11/2021   K 4.1 11/11/2021   CO2 19 (L) 11/11/2021   GLUCOSE 175 (H) 11/11/2021   BUN 11 11/11/2021   CREATININE 0.85 11/11/2021   BILITOT 0.4 11/11/2021   ALKPHOS 90 11/11/2021   AST 19 11/11/2021   ALT 25 11/11/2021   PROT 7.4 11/11/2021   ALBUMIN 4.6 11/11/2021   CALCIUM 9.3 11/11/2021   ANIONGAP 11 09/26/2021   EGFR 84 11/11/2021   Lab Results  Component Value Date   CHOL 257 (H) 11/11/2021   Lab Results  Component Value Date   HDL 46 11/11/2021   Lab Results  Component Value Date   LDLCALC 180 (H) 11/11/2021   Lab Results  Component Value Date   TRIG 166 (H) 11/11/2021   Lab Results  Component Value Date   CHOLHDL 5.6 (H) 11/11/2021   Lab Results  Component Value Date   HGBA1C 5.8 (H) 11/11/2021      Assessment & Plan:   Problem List Items Addressed This Visit       Cardiovascular and Mediastinum   Essential hypertension - Primary    BP Readings from Last 1 Encounters:  02/24/22 124/82  Well controlled with diet modification Continue HCTZ 12.5 mg as needed for leg swelling Counseled for compliance with the medications Advised DASH diet and moderate exercise/walking, at least 150 mins/week        Digestive   Irritable bowel syndrome with constipation    Uncontrolled with Linzess Has tried Colace, MiraLAX and Amitiza as well Takes senna as needed Start Trulance Advised to maintain adequate hydration      Relevant Medications   Plecanatide (TRULANCE) 3 MG TABS     Other   Obesity    BMI Readings from Last 3 Encounters:  02/24/22 29.54 kg/m  11/24/21  34.31 kg/m  09/21/21 33.91 kg/m  Advised to follow low-carb diet and perform moderate exercise at least 150 minutes/week Has prediabetes, HLD, HTN and GERD as well She is a good candidate for the GLP-1 agonist - on Wegovy - initial BMI - 34.31 Increased dose of Wegovy as tolerated.      Fibromyalgia    Had Rheumatology evaluation in 2019, was told of fibromyalgia, but did not start any treatment at that time Had started Cymbalta, but did not tolerate it If persistent symptoms or insomnia, may switch to Elavil Was referred to Rheumatology, but they do not manage fibromyalgia      Mixed hyperlipidemia    Check lipid profile Advised to follow low-carb and low-cholesterol diet for now With weight loss from GLP-1 agonist, her lipid profile should improve as well      Relevant Orders   Lipid panel   Right lower quadrant abdominal pain    Chronic RLQ abdominal pain, with intermittent flareups Check CT abdomen Previous CT abdomen was  negative for any acute pathology      Relevant Orders   CT Abdomen Pelvis Wo Contrast   Other Visit Diagnoses     Annual physical exam       Relevant Orders   TSH   Hemoglobin A1c   CMP14+EGFR   CBC with Differential/Platelet   Vitamin D deficiency       Relevant Orders   VITAMIN D 25 Hydroxy (Vit-D Deficiency, Fractures)       Meds ordered this encounter  Medications   Plecanatide (TRULANCE) 3 MG TABS    Sig: Take 1 tablet by mouth daily.    Dispense:  30 tablet    Refill:  0    Follow-up: Return in about 4 months (around 06/27/2022) for Annual physical.    Lindell Spar, MD

## 2022-02-24 NOTE — Assessment & Plan Note (Signed)
Uncontrolled with Linzess Has tried Colace, MiraLAX and Amitiza as well Takes senna as needed Start Trulance Advised to maintain adequate hydration

## 2022-02-24 NOTE — Assessment & Plan Note (Signed)
BP Readings from Last 1 Encounters:  02/24/22 124/82   Well controlled with diet modification Continue HCTZ 12.5 mg as needed for leg swelling Counseled for compliance with the medications Advised DASH diet and moderate exercise/walking, at least 150 mins/week

## 2022-02-24 NOTE — Patient Instructions (Signed)
Please continue to take medications as prescribed.  Please continue to follow low carb diet and perform moderate exercise/walking at least 150 mins/week.  Please start taking Trulance instead of Linzess.

## 2022-02-24 NOTE — Assessment & Plan Note (Signed)
Had Rheumatology evaluation in 2019, was told of fibromyalgia, but did not start any treatment at that time Had started Cymbalta, but did not tolerate it If persistent symptoms or insomnia, may switch to Elavil Was referred to Rheumatology, but they do not manage fibromyalgia

## 2022-02-24 NOTE — Assessment & Plan Note (Signed)
BMI Readings from Last 3 Encounters:  02/24/22 29.54 kg/m  11/24/21 34.31 kg/m  09/21/21 33.91 kg/m   Advised to follow low-carb diet and perform moderate exercise at least 150 minutes/week Has prediabetes, HLD, HTN and GERD as well She is a good candidate for the GLP-1 agonist - on Wegovy - initial BMI - 34.31 Increased dose of Wegovy as tolerated.

## 2022-02-27 ENCOUNTER — Other Ambulatory Visit (HOSPITAL_COMMUNITY): Payer: Self-pay

## 2022-02-28 ENCOUNTER — Other Ambulatory Visit: Payer: Self-pay | Admitting: Internal Medicine

## 2022-02-28 ENCOUNTER — Encounter: Payer: Self-pay | Admitting: Internal Medicine

## 2022-02-28 ENCOUNTER — Other Ambulatory Visit (HOSPITAL_COMMUNITY): Payer: Self-pay

## 2022-02-28 DIAGNOSIS — F411 Generalized anxiety disorder: Secondary | ICD-10-CM

## 2022-02-28 MED ORDER — ALPRAZOLAM 1 MG PO TABS
1.0000 mg | ORAL_TABLET | Freq: Every evening | ORAL | 2 refills | Status: DC | PRN
  Filled 2022-02-28: qty 30, 30d supply, fill #0
  Filled 2022-03-28: qty 30, 30d supply, fill #1
  Filled 2022-04-26: qty 30, 30d supply, fill #2

## 2022-03-03 ENCOUNTER — Other Ambulatory Visit (HOSPITAL_COMMUNITY): Payer: Self-pay

## 2022-03-08 ENCOUNTER — Other Ambulatory Visit (HOSPITAL_COMMUNITY): Payer: Self-pay

## 2022-03-09 ENCOUNTER — Other Ambulatory Visit (HOSPITAL_COMMUNITY): Payer: Self-pay

## 2022-03-16 ENCOUNTER — Other Ambulatory Visit (HOSPITAL_COMMUNITY): Payer: No Typology Code available for payment source

## 2022-03-29 ENCOUNTER — Encounter: Payer: Self-pay | Admitting: Internal Medicine

## 2022-03-29 ENCOUNTER — Other Ambulatory Visit: Payer: Self-pay | Admitting: *Deleted

## 2022-03-29 ENCOUNTER — Other Ambulatory Visit (HOSPITAL_COMMUNITY): Payer: Self-pay

## 2022-03-30 ENCOUNTER — Other Ambulatory Visit: Payer: Self-pay | Admitting: *Deleted

## 2022-03-30 DIAGNOSIS — K219 Gastro-esophageal reflux disease without esophagitis: Secondary | ICD-10-CM

## 2022-03-30 DIAGNOSIS — K581 Irritable bowel syndrome with constipation: Secondary | ICD-10-CM

## 2022-03-30 DIAGNOSIS — Z86010 Personal history of colonic polyps: Secondary | ICD-10-CM

## 2022-04-04 ENCOUNTER — Other Ambulatory Visit (INDEPENDENT_AMBULATORY_CARE_PROVIDER_SITE_OTHER): Payer: Self-pay | Admitting: Internal Medicine

## 2022-04-04 ENCOUNTER — Other Ambulatory Visit (HOSPITAL_COMMUNITY): Payer: Self-pay

## 2022-04-04 ENCOUNTER — Other Ambulatory Visit: Payer: Self-pay | Admitting: Allergy & Immunology

## 2022-04-04 MED ORDER — IPRATROPIUM BROMIDE 0.06 % NA SOLN
2.0000 | Freq: Three times a day (TID) | NASAL | 0 refills | Status: AC
  Filled 2022-04-04: qty 15, 13d supply, fill #0

## 2022-04-05 ENCOUNTER — Other Ambulatory Visit (HOSPITAL_COMMUNITY): Payer: Self-pay

## 2022-04-05 ENCOUNTER — Telehealth (INDEPENDENT_AMBULATORY_CARE_PROVIDER_SITE_OTHER): Payer: Self-pay

## 2022-04-05 ENCOUNTER — Other Ambulatory Visit (INDEPENDENT_AMBULATORY_CARE_PROVIDER_SITE_OTHER): Payer: Self-pay | Admitting: Internal Medicine

## 2022-04-05 MED ORDER — LANSOPRAZOLE 30 MG PO CPDR
30.0000 mg | DELAYED_RELEASE_CAPSULE | Freq: Every day | ORAL | 0 refills | Status: DC
  Filled 2022-04-05: qty 90, 90d supply, fill #0

## 2022-04-05 NOTE — Telephone Encounter (Signed)
Will refill medication for 3 months, needs to come to follow up appointment with to receive any refills.  Thanks,  Maylon Peppers, MD Gastroenterology and Hepatology [facility]

## 2022-04-05 NOTE — Telephone Encounter (Signed)
lansoprazole (PREVACID) 30 MG capsule       Sig: Take 1 capsule (30 mg total) by mouth daily at 12 noon.   Disp:  90 capsule    Refills:  0   Start: 04/05/2022   Class: Normal   Authorized by: Harvel Quale, MD      To be filled at: [place]       04/05/2022 - Rx Request: Mychart, Generic and others [facility]) View All Conversations on this Encounter April 05, 2022       04/05/22  3:26 PM Montez Morita, Quillian Quince, MD routed this conversation to Me        04/05/22  3:26 PM  Harvel Quale, MD signed an order  Montez Morita, Quillian Quince, MD to Me      04/05/22  3:26 PM Note Will refill medication for 3 months, needs to come to follow up appointment with to receive any refills.   Thanks,   Maylon Peppers, MD Gastroenterology and Hepatology [facility]

## 2022-04-05 NOTE — Telephone Encounter (Signed)
Last seen March 2021. Has upcoming appoint in November 2023 with [place]. Please advise to fill.

## 2022-04-06 ENCOUNTER — Other Ambulatory Visit (HOSPITAL_COMMUNITY): Payer: Self-pay

## 2022-04-13 ENCOUNTER — Other Ambulatory Visit (HOSPITAL_COMMUNITY): Payer: No Typology Code available for payment source

## 2022-04-19 ENCOUNTER — Other Ambulatory Visit (HOSPITAL_COMMUNITY): Payer: Self-pay

## 2022-04-20 ENCOUNTER — Other Ambulatory Visit (HOSPITAL_COMMUNITY): Payer: Self-pay

## 2022-04-21 ENCOUNTER — Other Ambulatory Visit (HOSPITAL_COMMUNITY): Payer: No Typology Code available for payment source

## 2022-04-26 ENCOUNTER — Other Ambulatory Visit (HOSPITAL_COMMUNITY): Payer: Self-pay

## 2022-05-02 ENCOUNTER — Other Ambulatory Visit (HOSPITAL_COMMUNITY): Payer: Self-pay

## 2022-05-03 ENCOUNTER — Other Ambulatory Visit (HOSPITAL_COMMUNITY): Payer: Self-pay

## 2022-05-04 ENCOUNTER — Other Ambulatory Visit (HOSPITAL_COMMUNITY): Payer: Self-pay

## 2022-05-08 ENCOUNTER — Other Ambulatory Visit (HOSPITAL_COMMUNITY): Payer: Self-pay

## 2022-05-08 ENCOUNTER — Other Ambulatory Visit: Payer: Self-pay | Admitting: Internal Medicine

## 2022-05-08 ENCOUNTER — Encounter: Payer: Self-pay | Admitting: Internal Medicine

## 2022-05-08 DIAGNOSIS — F411 Generalized anxiety disorder: Secondary | ICD-10-CM

## 2022-05-08 DIAGNOSIS — E6609 Other obesity due to excess calories: Secondary | ICD-10-CM

## 2022-05-08 MED ORDER — ALPRAZOLAM 1 MG PO TABS
1.0000 mg | ORAL_TABLET | Freq: Every evening | ORAL | 2 refills | Status: DC | PRN
  Filled 2022-05-08 – 2022-05-27 (×2): qty 30, 30d supply, fill #0
  Filled 2022-06-28: qty 30, 30d supply, fill #1
  Filled 2022-08-03: qty 30, 30d supply, fill #2

## 2022-05-08 MED ORDER — SEMAGLUTIDE-WEIGHT MANAGEMENT 2.4 MG/0.75ML ~~LOC~~ SOAJ
2.4000 mg | SUBCUTANEOUS | 5 refills | Status: DC
  Filled 2022-05-08: qty 3, 28d supply, fill #0
  Filled 2022-06-12: qty 3, 28d supply, fill #1
  Filled 2022-07-07: qty 3, 28d supply, fill #2

## 2022-05-23 ENCOUNTER — Other Ambulatory Visit (HOSPITAL_COMMUNITY): Payer: Self-pay

## 2022-05-24 ENCOUNTER — Other Ambulatory Visit (HOSPITAL_COMMUNITY): Payer: Self-pay

## 2022-05-25 ENCOUNTER — Other Ambulatory Visit (HOSPITAL_COMMUNITY): Payer: Self-pay

## 2022-05-27 ENCOUNTER — Other Ambulatory Visit (HOSPITAL_COMMUNITY): Payer: Self-pay

## 2022-05-30 ENCOUNTER — Encounter: Payer: Self-pay | Admitting: Internal Medicine

## 2022-05-30 ENCOUNTER — Ambulatory Visit (INDEPENDENT_AMBULATORY_CARE_PROVIDER_SITE_OTHER): Payer: No Typology Code available for payment source | Admitting: Internal Medicine

## 2022-05-30 DIAGNOSIS — J069 Acute upper respiratory infection, unspecified: Secondary | ICD-10-CM | POA: Diagnosis not present

## 2022-05-30 DIAGNOSIS — R42 Dizziness and giddiness: Secondary | ICD-10-CM | POA: Diagnosis not present

## 2022-05-30 LAB — POCT INFLUENZA A/B
Influenza A, POC: NEGATIVE
Influenza B, POC: NEGATIVE

## 2022-05-30 MED ORDER — MECLIZINE HCL 25 MG PO TABS: 25.0000 mg | ORAL_TABLET | Freq: Three times a day (TID) | ORAL | 0 refills | Status: DC | PRN

## 2022-05-30 NOTE — Progress Notes (Signed)
Virtual Visit via Telephone Note   This visit type was conducted via telephone. This format is felt to be most appropriate for this patient at this time.  The patient did not have access to video technology/had technical difficulties with video requiring transitioning to audio format only (telephone).  All issues noted in this document were discussed and addressed.  No physical exam could be performed with this format.  Evaluation Performed:  Follow-up visit  Date:  05/30/2022   ID:  Theresa [name], DOB 1970-12-12, MRN [id number]  Patient Location: Home Provider Location: Office/Clinic  Participants: Patient Location of Patient: Home Location of Provider: Telehealth Consent was obtain for visit to be over via telehealth. I verified that I am speaking with the correct person using two identifiers.  PCP:  Lindell Spar, MD   Chief Complaint: Sinus pressure and dizziness  History of Present Illness:    Theresa [name] is a 51 y.o. female who has a televisit for complaint of nasal congestion, sinus pressure and dizziness since yesterday.  She denies any fever or chills.  Her home COVID test was negative.  She has chronic nausea from [facility] and has had few episodes of vomiting as well.  She denies diarrhea, but takes MiraLAX for constipation. She reports that she feels more dizzy upon standing.  The patient does have symptoms concerning for COVID-19 infection (fever, chills, cough, or new shortness of breath).   Past Medical, Surgical, Social History, Allergies, and Medications have been Reviewed.  Past Medical History:  Diagnosis Date   Allergy    Allergy to alpha-gal    Anxiety    Colon adenoma DEC 2014   ONE(7 MM) SIMPLE([place])   Concussion with loss of consciousness 02/17/2019   Depression    Edema    GERD (gastroesophageal reflux disease)    History of abnormal cervical Pap smear 12/02/2013   History of nephrolithiasis    HPV (human papilloma virus) infection    Hyperlipidemia     IBS (irritable bowel syndrome)    constipation   Insomnia    Lymphadenopathy 07/12/2020   Obesity 01/14/2014   Positive test for Epstein-Barr virus (EBV) 08/21/2017   Pre-diabetes 09/2014   Superficial basal cell carcinoma (BCC) 12/26/2019   Right Breast- treatment Imiquimoid   Yeast infection 09/04/2013   Past Surgical History:  Procedure Laterality Date   BIOPSY N/A 08/01/2013   Procedure: BIOPSY;  Surgeon: Danie Binder, MD;  [facility];  Service: Endoscopy;  Laterality: N/A;  FOR MICROSCOPIC COLITIS   BIOPSY  08/09/2018   Procedure: BIOPSY;  Surgeon: Rogene Houston, MD;  [facility]: [facility];  Service: Endoscopy;;  gastric   BREAST REDUCTION SURGERY  2009   BREAST SURGERY  2007   breast reduction   CHOLECYSTECTOMY N/A 11/04/2014   Procedure: LAPAROSCOPIC CHOLECYSTECTOMY;  Surgeon: Aviva Signs Md, MD;  Location: [facility];  Service: General;  Laterality: N/A;   COLONOSCOPY  Oct 2009   Dr. Carlean Purl: int/ext hemorrhoids, ?mild proctitis but path benign, likely r/t irritation from bowel prep   COLONOSCOPY N/A 08/01/2013   Procedure: COLONOSCOPY;  Surgeon: Danie Binder, MD;  [facility]: [facility];  Service: Endoscopy;  Laterality: N/A;  12:00   COLONOSCOPY WITH PROPOFOL N/A 08/09/2018   Procedure: COLONOSCOPY WITH PROPOFOL;  Surgeon: Rogene Houston, MD;  [facility];  Service: Endoscopy;  Laterality: N/A;  75   ESOPHAGOGASTRODUODENOSCOPY  Dec 2009   Dr. Carlean Purl: reflux esophagitis, proximal gastric polyps,  benign   ESOPHAGOGASTRODUODENOSCOPY N/A 12/18/2014   Procedure: ESOPHAGOGASTRODUODENOSCOPY (EGD);  Surgeon: Rogene Houston, MD;  [facility];  Service: Endoscopy;  Laterality: N/A;  230   ESOPHAGOGASTRODUODENOSCOPY (EGD) WITH PROPOFOL N/A 08/09/2018   Procedure: ESOPHAGOGASTRODUODENOSCOPY (EGD) WITH PROPOFOL;  Surgeon: Rogene Houston, MD;  [facility];  Service: Endoscopy;  Laterality: N/A;   HEMORRHOID BANDING N/A 08/01/2013    Procedure: HEMORRHOID BANDING;  Surgeon: Danie Binder, MD;  [facility];  Service: Endoscopy;  Laterality: N/A;  internal hemorrhoid banding   LIVER BIOPSY N/A 11/04/2014   Procedure: LIVER BIOPSY;  Surgeon: Aviva Signs Md, MD;  Location: [facility];  Service: General;  Laterality: N/A;   POLYPECTOMY  2015   Dr.Fields   POLYPECTOMY  08/09/2018   Procedure: POLYPECTOMY;  Surgeon: Rogene Houston, MD;  [facility];  Service: Endoscopy;;  colon   REDUCTION MAMMAPLASTY Bilateral 2006   UPPER GASTROINTESTINAL ENDOSCOPY  2006   Dr. Nita Sickle in [place]     Current Meds  Medication Sig   ALPRAZolam (XANAX) 1 MG tablet Take 1 tablet by mouth at bedtime as needed for anxiety.   Carbinoxamine Maleate 4 MG TABS Take 1 tablet by mouth 2 times daily as needed.   citalopram (CELEXA) 40 MG tablet Take 1 tablet (40 mg total) by mouth daily.   cycloSPORINE (RESTASIS) 0.05 % ophthalmic emulsion Place 1 drop into both eyes 2 (two) times daily.   EPINEPHrine (EPIPEN 2-PAK) 0.3 mg/0.3 mL IJ SOAJ injection Inject 0.3 mLs (0.3 mg total) into the muscle as needed for anaphylaxis.   fluticasone (FLONASE) 50 MCG/ACT nasal spray Place 1 spray into both nostrils 2 (two) times daily as needed for allergies or rhinitis   hydrochlorothiazide (MICROZIDE) 12.5 MG capsule Take 1 capsule (12.5 mg total) by mouth daily as needed.   ipratropium (ATROVENT) 0.06 % nasal spray Place 2 sprays into both nostrils 3 (three) times daily.   lansoprazole (PREVACID) 30 MG capsule Take 1 capsule (30 mg total) by mouth daily at 12 noon.   levonorgestrel (MIRENA) 20 MCG/24HR IUD 1 each by Intrauterine route once.   predniSONE (DELTASONE) 10 MG tablet Take 1 tablet (10 mg total) by mouth daily as needed for up to 30 doses. Take one tablet one hour before allergy shots.   Semaglutide-Weight Management 2.4 MG/0.75ML SOAJ Inject 2.4 mg into the skin once a week.   valACYclovir (VALTREX) 1000 MG tablet Take 1,000 mg by  mouth daily.     Allergies:   Heparin (porcine)   ROS:   Please see the history of present illness.     All other systems reviewed and are negative.   Labs/Other Tests and Data Reviewed:    Recent Labs: 11/11/2021: ALT 25; BUN 11; Creatinine, Ser 0.85; Hemoglobin 13.6; Platelets 299; Potassium 4.1; Sodium 135; TSH 1.820   Recent Lipid Panel Lab Results  Component Value Date/Time   CHOL 257 (H) 11/11/2021 12:00 AM   TRIG 166 (H) 11/11/2021 12:00 AM   HDL 46 11/11/2021 12:00 AM   CHOLHDL 5.6 (H) 11/11/2021 12:00 AM   LDLCALC 180 (H) 11/11/2021 12:00 AM    Wt Readings from Last 3 Encounters:  02/24/22 183 lb (83 kg)  11/24/21 212 lb 9.6 oz (96.4 kg)  09/21/21 210 lb 1.3 oz (95.3 kg)     ASSESSMENT & PLAN:    URTI Complains of sinus pressure and pressure behind ears Also complains of dizziness Check rapid flu and COVID  RT-PCR Norel as needed for nasal congestion Advised to maintain adequate hydration  Dizziness Could be due to postural hypotension from vomiting and/or vertigo Maintain adequate hydration Meclizine as needed for dizziness  Time:   Today, I have spent 12 minutes reviewing the chart, including problem list, medications, and with the patient with telehealth technology discussing the above problems.   Medication Adjustments/Labs and Tests Ordered: Current medicines are reviewed at length with the patient today.  Concerns regarding medicines are outlined above.   Tests Ordered: No orders of the defined types were placed in this encounter.   Medication Changes: No orders of the defined types were placed in this encounter.    Note: This dictation was prepared with Dragon dictation along with smaller phrase technology. Similar sounding words can be transcribed inadequately or may not be corrected upon review. Any transcriptional errors that result from this process are unintentional.      Disposition:  Follow up  Signed, Lindell Spar, MD   05/30/2022 11:20 AM     [place]

## 2022-05-30 NOTE — Patient Instructions (Signed)
Please take Norel as needed for nasal congestion.  Maintain adequate hydration by taking at least 64 ounces of fluid in a day.  Please avoid sudden positional changes.  Please come to office for flu and COVID testing.

## 2022-05-30 NOTE — Addendum Note (Signed)
Addended by: Zacarias Pontes R on: 05/30/2022 01:39 PM   Modules accepted: Orders

## 2022-05-31 ENCOUNTER — Encounter (INDEPENDENT_AMBULATORY_CARE_PROVIDER_SITE_OTHER): Payer: Self-pay

## 2022-05-31 ENCOUNTER — Other Ambulatory Visit (HOSPITAL_COMMUNITY): Payer: Self-pay

## 2022-06-01 ENCOUNTER — Other Ambulatory Visit: Payer: Self-pay

## 2022-06-01 ENCOUNTER — Emergency Department (HOSPITAL_COMMUNITY)
Admission: EM | Admit: 2022-06-01 | Discharge: 2022-06-01 | Disposition: A | Discharge status: Home / Self Care | Payer: No Typology Code available for payment source | Attending: Emergency Medicine | Admitting: Emergency Medicine

## 2022-06-01 ENCOUNTER — Encounter: Payer: Self-pay | Admitting: Internal Medicine

## 2022-06-01 ENCOUNTER — Encounter (HOSPITAL_COMMUNITY): Payer: Self-pay | Admitting: *Deleted

## 2022-06-01 DIAGNOSIS — E860 Dehydration: Secondary | ICD-10-CM | POA: Diagnosis not present

## 2022-06-01 DIAGNOSIS — R072 Precordial pain: Secondary | ICD-10-CM | POA: Diagnosis present

## 2022-06-01 DIAGNOSIS — R0789 Other chest pain: Secondary | ICD-10-CM

## 2022-06-01 DIAGNOSIS — R531 Weakness: Secondary | ICD-10-CM

## 2022-06-01 LAB — CBC WITH DIFFERENTIAL/PLATELET
Abs Immature Granulocytes: 0.02 10*3/uL (ref 0.00–0.07)
Basophils Absolute: 0.1 10*3/uL (ref 0.0–0.1)
Basophils Relative: 1 %
Eosinophils Absolute: 0.1 10*3/uL (ref 0.0–0.5)
Eosinophils Relative: 2 %
HCT: 43.6 % (ref 36.0–46.0)
Hemoglobin: 14.4 g/dL (ref 12.0–15.0)
Immature Granulocytes: 0 %
Lymphocytes Relative: 23 %
Lymphs Abs: 1.4 10*3/uL (ref 0.7–4.0)
MCH: 30.2 pg (ref 26.0–34.0)
MCHC: 33 g/dL (ref 30.0–36.0)
MCV: 91.4 fL (ref 80.0–100.0)
Monocytes Absolute: 0.3 10*3/uL (ref 0.1–1.0)
Monocytes Relative: 5 %
Neutro Abs: 4.1 10*3/uL (ref 1.7–7.7)
Neutrophils Relative %: 69 %
Platelets: 314 10*3/uL (ref 150–400)
RBC: 4.77 MIL/uL (ref 3.87–5.11)
RDW: 12.8 % (ref 11.5–15.5)
WBC: 5.9 10*3/uL (ref 4.0–10.5)
nRBC: 0 % (ref 0.0–0.2)

## 2022-06-01 LAB — BASIC METABOLIC PANEL
Anion gap: 8 (ref 5–15)
BUN: 7 mg/dL (ref 6–20)
CO2: 24 mmol/L (ref 22–32)
Calcium: 8.7 mg/dL — ABNORMAL LOW (ref 8.9–10.3)
Chloride: 105 mmol/L (ref 98–111)
Creatinine, Ser: 0.94 mg/dL (ref 0.44–1.00)
GFR, Estimated: >60 mL/min (ref >60)
Glucose, Bld: 89 mg/dL (ref 70–99)
Potassium: 3.5 mmol/L (ref 3.5–5.1)
Sodium: 137 mmol/L (ref 135–145)

## 2022-06-01 LAB — TSH: TSH: 2.332 u[IU]/mL (ref 0.350–4.500)

## 2022-06-01 LAB — HEPATIC FUNCTION PANEL
ALT: 14 U/L (ref 0–44)
AST: 16 U/L (ref 15–41)
Albumin: 3.7 g/dL (ref 3.5–5.0)
Alkaline Phosphatase: 72 U/L (ref 38–126)
Bilirubin, Direct: 0.1 mg/dL (ref 0.0–0.2)
Indirect Bilirubin: 0.6 mg/dL (ref 0.3–0.9)
Total Bilirubin: 0.7 mg/dL (ref 0.3–1.2)
Total Protein: 7.2 g/dL (ref 6.5–8.1)

## 2022-06-01 LAB — TROPONIN I (HIGH SENSITIVITY): Troponin I (High Sensitivity): <2 ng/L (ref <18)

## 2022-06-01 LAB — CK: Total CK: 29 U/L — ABNORMAL LOW (ref 38–234)

## 2022-06-01 LAB — NOVEL CORONAVIRUS, NAA: SARS-CoV-2, NAA: NOT DETECTED

## 2022-06-01 MED ORDER — SUCRALFATE 1 G PO TABS: 1.0000 g | ORAL_TABLET | Freq: Three times a day (TID) | ORAL | 0 refills | Status: DC

## 2022-06-01 NOTE — ED Provider Notes (Signed)
[facility] EMERGENCY DEPARTMENT Provider Note   CSN: [id number] Arrival date & time: 06/01/22  0845     History  Chief Complaint  Patient presents with   Chest Pain    Theresa [name] is a 51 y.o. female.  HPI    51 yo female with a history of weygove use presents to the ED for chest pain and fatigue. states she woke up on Monday with chest pressure a headache and feeling dizzy, lightheaded and dehydrated. States she overall feels weakness as well. She describes the chest pain as sternal pressure that radiates to both the left and right sides of her chest as well as the left shoulder. Patient states she has been taking [facility] for weight loss since April 2023 and has been vomiting multiple times a week since starting the medication. She took Ibuprofen for her pain with no relief and drank some pedialyte as well with minimal relief. She met with her PCP on Tuesday and tested negative for COVID19 and flu. Patient is seemingly anxious about Weygove use and states she feels exhausted from months of repeated vomiting. Patient denies fever, shortness of breath, cough, recent illness, sick contacts or abdominal pain.  Home Medications Prior to Admission medications   Medication Sig Start Date End Date Taking? Authorizing Provider  ALPRAZolam Duanne Moron) 1 MG tablet Take 1 tablet by mouth at bedtime as needed for anxiety. 05/08/22  Yes Lindell Spar, MD  citalopram (CELEXA) 40 MG tablet Take 1 tablet (40 mg total) by mouth daily. 01/23/22  Yes Lindell Spar, MD  EPINEPHrine (EPIPEN 2-PAK) 0.3 mg/0.3 mL IJ SOAJ injection Inject 0.3 mLs (0.3 mg total) into the muscle as needed for anaphylaxis. 09/10/19  Yes Valentina Shaggy, MD  fluticasone [facility]) 50 MCG/ACT nasal spray Place 1 spray into both nostrils 2 (two) times daily as needed for allergies or rhinitis 04/03/21  Yes Valentina Shaggy, MD  lansoprazole (PREVACID) 30 MG capsule Take 1 capsule (30 mg total) by mouth daily at 12 noon.  04/05/22  Yes Harvel Quale, MD  magnesium oxide (MAG-OX) 400 (240 Mg) MG tablet Take 400 mg by mouth daily.   Yes [provider]  meclizine (ANTIVERT) 25 MG tablet Take 1 tablet (25 mg total) by mouth 3 (three) times daily as needed for dizziness. 05/30/22  Yes Lindell Spar, MD  Semaglutide-Weight Management 2.4 MG/0.75ML SOAJ Inject 2.4 mg into the skin once a week. 05/08/22  Yes Lindell Spar, MD  sucralfate (CARAFATE) 1 g tablet Take 1 tablet (1 g total) by mouth 4 (four) times daily -  with meals and at bedtime. 06/01/22  Yes Piper Hassebrock, Thelma Comp, MD  Carbinoxamine Maleate 4 MG TABS Take 1 tablet by mouth 2 times daily as needed. Patient not taking: Reported on 06/01/2022 12/02/21   Valentina Shaggy, MD  cycloSPORINE (RESTASIS) 0.05 % ophthalmic emulsion Place 1 drop into both eyes 2 (two) times daily. Patient not taking: Reported on 06/01/2022 11/16/20     hydrochlorothiazide (MICROZIDE) 12.5 MG capsule Take 1 capsule (12.5 mg total) by mouth daily as needed. Patient not taking: Reported on 06/01/2022 09/21/21   Lindell Spar, MD  ipratropium (ATROVENT) 0.06 % nasal spray Place 2 sprays into both nostrils 3 (three) times daily. Patient not taking: Reported on 06/01/2022 04/04/22   Valentina Shaggy, MD  levonorgestrel [facility]) 20 MCG/24HR IUD 1 each by Intrauterine route once.    [provider]  predniSONE (DELTASONE) 10 MG tablet Take 1 tablet (  10 mg total) by mouth daily as needed for up to 30 doses. Take one tablet one hour before allergy shots. Patient not taking: Reported on 06/01/2022 11/23/21   Valentina Shaggy, MD      Allergies    Heparin (porcine)    Review of Systems   Review of Systems  All other systems reviewed and are negative.   Physical Exam Updated Vital Signs BP 128/87   Pulse 68   Temp 98.3 F (36.8 C) (Oral)   Resp 10   Ht _0  (1.676 m)   Wt 72.1 kg   SpO2 100%   BMI 25.66 kg/m  Physical Exam Vitals and nursing  note reviewed.  Constitutional:      Appearance: She is well-developed.  HENT:     Head: Atraumatic.  Cardiovascular:     Rate and Rhythm: Normal rate.  Pulmonary:     Effort: Pulmonary effort is normal.  Musculoskeletal:     Cervical back: Normal range of motion and neck supple.  Skin:    General: Skin is warm and dry.  Neurological:     Mental Status: She is alert and oriented to person, place, and time.     ED Results / Procedures / Treatments   Labs (all labs ordered are listed, but only abnormal results are displayed) Labs Reviewed  BASIC METABOLIC PANEL - Abnormal; Notable for the following components:      Result Value   Calcium 8.7 (*)    All other components within normal limits  CK - Abnormal; Notable for the following components:   Total CK 29 (*)    All other components within normal limits  CBC WITH DIFFERENTIAL/PLATELET  HEPATIC FUNCTION PANEL  TSH  TROPONIN I (HIGH SENSITIVITY)    EKG EKG Interpretation  Date/Time:  Thursday June 01 2022 08:53:43 EDT Ventricular Rate:  79 PR Interval:  141 QRS Duration: 93 QT Interval:  504 QTC Calculation: 578 R Axis:   61 Text Interpretation: Sinus rhythm Borderline T wave abnormalities Prolonged QT interval No acute changes No significant change since last tracing Confirmed by Varney Biles [phone]) on 06/01/2022 9:31:13 AM  Radiology No results found.  Procedures Procedures    Medications Ordered in ED Medications - No data to display  ED Course/ Medical Decision Making/ A&P                           Medical Decision Making Problems Addressed: Chest pressure: acute illness or injury Generalized weakness: acute illness or injury with systemic symptoms  Amount and/or Complexity of Data Reviewed Labs: ordered.  Risk Prescription drug management.  This patient presents to the ED with chief complaint(s) of chest pressure, generalized weakness with pertinent past medical history of Wegovy use.The  complaint involves an extensive differential diagnosis and also carries with it a high risk of complications and morbidity.    The differential diagnosis includes acute coronary syndrome, esophageal spasm, gastroparesis, dehydration, side effects of Wegovy  The initial plan is to get basic labs including troponin.   Additional history obtained:\ Records reviewed Primary Care Documents, recent visit.  COVID-19 test was negative.  Independent labs interpretation:  The following labs were independently interpreted: CBC, BMP are reassuring.  No evidence of severe dehydration.  Orthostatic blood pressure was also within normal limit.  Treatment and Reassessment: Results of the ER work-up discussed with the patient.  Indicated to her that her troponin, EKG are reassuring.  Electrolytes are  within normal limits.  No evidence of profound dehydration based on CBC, BMP.  Shared with her that it is possible that she is having side effects from [facility] Generalized weakness, dizziness are listed as common side effects of above including GI issues like nausea and vomiting which she is already suffering.  Advised PCP follow-up in 1 week if the symptoms continue.    Final Clinical Impression(s) / ED Diagnoses Final diagnoses:  Generalized weakness  Chest pressure    Rx / DC Orders ED Discharge Orders          Ordered    sucralfate (CARAFATE) 1 g tablet  3 times daily with meals & bedtime        06/01/22 1306              Varney Biles, MD 06/01/22 1612

## 2022-06-01 NOTE — ED Triage Notes (Signed)
Pt c/o chest pressure that started on Monday; pt states she has pain that radiates to her left shoulder and has some nausea and weakness

## 2022-06-01 NOTE — Discharge Instructions (Addendum)
We signed the emergency room for chest pressure and weakness.  The work-up in the ER that was evaluating you for severe dehydration, cardiac injury is negative.  EKG shows no concerning findings either.  There is no evidence of anemia, renal failure, severe electrolyte abnormality and your thyroid hormone is also reassuring.  We did look up side effects for Wegovy, and fatigue and dizziness are listed as common side effects along with heartburn.  We suspect that it could be the causative agent for you.  You will need to discuss your symptoms with your primary care doctor to see if they to think it is medication related side effect or other cause then will need deeper work-up.  Please return to the ER if you have worsening chest pain, shortness of breath, pain radiating to your jaw, shoulder, or back, sweats or fainting. Otherwise see the Cardiologist or your primary care doctor as requested.

## 2022-06-05 ENCOUNTER — Ambulatory Visit (INDEPENDENT_AMBULATORY_CARE_PROVIDER_SITE_OTHER): Payer: No Typology Code available for payment source | Admitting: Gastroenterology

## 2022-06-08 ENCOUNTER — Encounter: Payer: Self-pay | Admitting: Internal Medicine

## 2022-06-08 ENCOUNTER — Ambulatory Visit (INDEPENDENT_AMBULATORY_CARE_PROVIDER_SITE_OTHER): Payer: No Typology Code available for payment source | Admitting: Internal Medicine

## 2022-06-08 DIAGNOSIS — B9689 Other specified bacterial agents as the cause of diseases classified elsewhere: Secondary | ICD-10-CM | POA: Diagnosis not present

## 2022-06-08 DIAGNOSIS — H109 Unspecified conjunctivitis: Secondary | ICD-10-CM | POA: Diagnosis not present

## 2022-06-08 MED ORDER — OFLOXACIN 0.3 % OP SOLN: 1.0000 [drp] | Freq: Four times a day (QID) | OPHTHALMIC | 0 refills | Status: DC

## 2022-06-08 NOTE — Progress Notes (Signed)
Virtual Visit via Telephone Note   This visit type was conducted via telephone. This format is felt to be most appropriate for this patient at this time.  The patient did not have access to video technology/had technical difficulties with video requiring transitioning to audio format only (telephone).  All issues noted in this document were discussed and addressed.  No physical exam could be performed with this format.  Evaluation Performed:  Follow-up visit  Date:  06/08/2022   ID:  Theresa [name], DOB 11/15/1970, MRN [id number]  Patient Location: Home Provider Location: Office/Clinic  Participants: Patient Location of Patient: Home Location of Provider: Telehealth Consent was obtain for visit to be over via telehealth. I verified that I am speaking with the correct person using two identifiers.  PCP:  Lindell Spar, MD   Chief Complaint: Eye redness and discharge  History of Present Illness:    Theresa [name] is a 51 y.o. female who has a televisit for c/o eye redness, yellowish crusting and difficulty opening eyes due to discharge for the last 3 days.  She initially had redness of the right eye, which has progressed to left eye as well.  She currently uses artificial tears for dry eyes.  The patient does not have symptoms concerning for COVID-19 infection (fever, chills, cough, or new shortness of breath).   Past Medical, Surgical, Social History, Allergies, and Medications have been Reviewed.  Past Medical History:  Diagnosis Date   Allergy    Allergy to alpha-gal    Anxiety    Colon adenoma DEC 2014   ONE(7 MM) SIMPLE([place])   Concussion with loss of consciousness 02/17/2019   Depression    Edema    GERD (gastroesophageal reflux disease)    History of abnormal cervical Pap smear 12/02/2013   History of nephrolithiasis    HPV (human papilloma virus) infection    Hyperlipidemia    IBS (irritable bowel syndrome)    constipation   Insomnia    Lymphadenopathy 07/12/2020    Obesity 01/14/2014   Positive test for Epstein-Barr virus (EBV) 08/21/2017   Pre-diabetes 09/2014   Superficial basal cell carcinoma (BCC) 12/26/2019   Right Breast- treatment Imiquimoid   Yeast infection 09/04/2013   Past Surgical History:  Procedure Laterality Date   BIOPSY N/A 08/01/2013   Procedure: BIOPSY;  Surgeon: Danie Binder, MD;  [facility];  Service: Endoscopy;  Laterality: N/A;  FOR MICROSCOPIC COLITIS   BIOPSY  08/09/2018   Procedure: BIOPSY;  Surgeon: Rogene Houston, MD;  [facility];  Service: Endoscopy;;  gastric   BREAST REDUCTION SURGERY  2009   BREAST SURGERY  2007   breast reduction   CHOLECYSTECTOMY N/A 11/04/2014   Procedure: LAPAROSCOPIC CHOLECYSTECTOMY;  Surgeon: Aviva Signs Md, MD;  Location: [facility];  Service: General;  Laterality: N/A;   COLONOSCOPY  Oct 2009   Dr. Carlean Purl: int/ext hemorrhoids, ?mild proctitis but path benign, likely r/t irritation from bowel prep   COLONOSCOPY N/A 08/01/2013   Procedure: COLONOSCOPY;  Surgeon: Danie Binder, MD;  [facility];  Service: Endoscopy;  Laterality: N/A;  12:00   COLONOSCOPY WITH PROPOFOL N/A 08/09/2018   Procedure: COLONOSCOPY WITH PROPOFOL;  Surgeon: Rogene Houston, MD;  [facility];  Service: Endoscopy;  Laterality: N/A;  730   ESOPHAGOGASTRODUODENOSCOPY  Dec 2009   Dr. Carlean Purl: reflux esophagitis, proximal gastric polyps, benign   ESOPHAGOGASTRODUODENOSCOPY N/A 12/18/2014   Procedure: ESOPHAGOGASTRODUODENOSCOPY (EGD);  Surgeon: Bernadene Person  Gloriann Loan, MD;  [facility];  Service: Endoscopy;  Laterality: N/A;  230   ESOPHAGOGASTRODUODENOSCOPY (EGD) WITH PROPOFOL N/A 08/09/2018   Procedure: ESOPHAGOGASTRODUODENOSCOPY (EGD) WITH PROPOFOL;  Surgeon: Rogene Houston, MD;  [facility];  Service: Endoscopy;  Laterality: N/A;   HEMORRHOID BANDING N/A 08/01/2013   Procedure: HEMORRHOID BANDING;  Surgeon: Danie Binder, MD;  [facility];   Service: Endoscopy;  Laterality: N/A;  internal hemorrhoid banding   LIVER BIOPSY N/A 11/04/2014   Procedure: LIVER BIOPSY;  Surgeon: Aviva Signs Md, MD;  Location: [facility];  Service: General;  Laterality: N/A;   POLYPECTOMY  2015   Dr.Fields   POLYPECTOMY  08/09/2018   Procedure: POLYPECTOMY;  Surgeon: Rogene Houston, MD;  [facility];  Service: Endoscopy;;  colon   REDUCTION MAMMAPLASTY Bilateral 2006   UPPER GASTROINTESTINAL ENDOSCOPY  2006   Dr. Nita Sickle in [place]     Current Meds  Medication Sig   ALPRAZolam (XANAX) 1 MG tablet Take 1 tablet by mouth at bedtime as needed for anxiety.   Carbinoxamine Maleate 4 MG TABS Take 1 tablet by mouth 2 times daily as needed.   citalopram (CELEXA) 40 MG tablet Take 1 tablet (40 mg total) by mouth daily.   cycloSPORINE (RESTASIS) 0.05 % ophthalmic emulsion Place 1 drop into both eyes 2 (two) times daily.   EPINEPHrine (EPIPEN 2-PAK) 0.3 mg/0.3 mL IJ SOAJ injection Inject 0.3 mLs (0.3 mg total) into the muscle as needed for anaphylaxis.   fluticasone (FLONASE) 50 MCG/ACT nasal spray Place 1 spray into both nostrils 2 (two) times daily as needed for allergies or rhinitis   ipratropium (ATROVENT) 0.06 % nasal spray Place 2 sprays into both nostrils 3 (three) times daily.   lansoprazole (PREVACID) 30 MG capsule Take 1 capsule (30 mg total) by mouth daily at 12 noon.   levonorgestrel (MIRENA) 20 MCG/24HR IUD 1 each by Intrauterine route once.   magnesium oxide (MAG-OX) 400 (240 Mg) MG tablet Take 400 mg by mouth daily.   ofloxacin (OCUFLOX) 0.3 % ophthalmic solution Place 1 drop into both eyes 4 (four) times daily.   predniSONE (DELTASONE) 10 MG tablet Take 1 tablet (10 mg total) by mouth daily as needed for up to 30 doses. Take one tablet one hour before allergy shots.   Semaglutide-Weight Management 2.4 MG/0.75ML SOAJ Inject 2.4 mg into the skin once a week.   sucralfate (CARAFATE) 1 g tablet Take 1 tablet (1 g total) by mouth 4  (four) times daily -  with meals and at bedtime.     Allergies:   Heparin (porcine)   ROS:   Please see the history of present illness.     All other systems reviewed and are negative.   Labs/Other Tests and Data Reviewed:    Recent Labs: 06/01/2022: ALT 14; BUN 7; Creatinine, Ser 0.94; Hemoglobin 14.4; Platelets 314; Potassium 3.5; Sodium 137; TSH 2.332   Recent Lipid Panel Lab Results  Component Value Date/Time   CHOL 257 (H) 11/11/2021 12:00 AM   TRIG 166 (H) 11/11/2021 12:00 AM   HDL 46 11/11/2021 12:00 AM   CHOLHDL 5.6 (H) 11/11/2021 12:00 AM   LDLCALC 180 (H) 11/11/2021 12:00 AM    Wt Readings from Last 3 Encounters:  06/01/22 159 lb (72.1 kg)  02/24/22 183 lb (83 kg)  11/24/21 212 lb 9.6 oz (96.4 kg)     ASSESSMENT & PLAN:    Bacterial conjunctivitis of both eyes  Image reviewed through MyChart Started on right eye, progressed to left eye Started ofloxacin eyedrops Cool, wet cloths placing on the eyes.   Time:   Today, I have spent 7 minutes reviewing the chart, including problem list, medications, and with the patient with telehealth technology discussing the above problems.   Medication Adjustments/Labs and Tests Ordered: Current medicines are reviewed at length with the patient today.  Concerns regarding medicines are outlined above.   Tests Ordered: No orders of the defined types were placed in this encounter.   Medication Changes: Meds ordered this encounter  Medications   ofloxacin (OCUFLOX) 0.3 % ophthalmic solution    Sig: Place 1 drop into both eyes 4 (four) times daily.    Dispense:  5 mL    Refill:  0     Note: This dictation was prepared with Dragon dictation along with smaller phrase technology. Similar sounding words can be transcribed inadequately or may not be corrected upon review. Any transcriptional errors that result from this process are unintentional.      Disposition:  Follow up  Signed, Lindell Spar, MD  06/08/2022  12:08 PM     [place]

## 2022-06-13 ENCOUNTER — Other Ambulatory Visit (HOSPITAL_COMMUNITY): Payer: Self-pay

## 2022-06-20 ENCOUNTER — Other Ambulatory Visit: Payer: Self-pay | Admitting: Internal Medicine

## 2022-06-20 DIAGNOSIS — J019 Acute sinusitis, unspecified: Secondary | ICD-10-CM

## 2022-06-20 MED ORDER — AZITHROMYCIN 250 MG PO TABS: ORAL_TABLET | ORAL | 0 refills | Status: AC

## 2022-06-22 ENCOUNTER — Ambulatory Visit (INDEPENDENT_AMBULATORY_CARE_PROVIDER_SITE_OTHER): Payer: No Typology Code available for payment source | Admitting: Gastroenterology

## 2022-06-29 ENCOUNTER — Other Ambulatory Visit (HOSPITAL_COMMUNITY): Payer: Self-pay

## 2022-06-30 ENCOUNTER — Encounter: Payer: No Typology Code available for payment source | Admitting: Internal Medicine

## 2022-07-03 ENCOUNTER — Other Ambulatory Visit (HOSPITAL_COMMUNITY): Payer: Self-pay

## 2022-07-03 ENCOUNTER — Encounter (HOSPITAL_COMMUNITY): Payer: Self-pay

## 2022-07-04 ENCOUNTER — Other Ambulatory Visit (HOSPITAL_COMMUNITY): Payer: Self-pay

## 2022-07-05 ENCOUNTER — Other Ambulatory Visit (HOSPITAL_COMMUNITY): Payer: Self-pay

## 2022-07-07 ENCOUNTER — Other Ambulatory Visit (HOSPITAL_COMMUNITY): Payer: Self-pay

## 2022-07-14 LAB — HM PAP SMEAR: HM Pap smear: NORMAL

## 2022-07-19 ENCOUNTER — Encounter: Payer: Self-pay | Admitting: Internal Medicine

## 2022-07-19 ENCOUNTER — Ambulatory Visit (INDEPENDENT_AMBULATORY_CARE_PROVIDER_SITE_OTHER): Payer: No Typology Code available for payment source | Admitting: Family Medicine

## 2022-07-19 ENCOUNTER — Other Ambulatory Visit (HOSPITAL_COMMUNITY): Payer: Self-pay

## 2022-07-19 ENCOUNTER — Other Ambulatory Visit (HOSPITAL_COMMUNITY)
Admission: RE | Admit: 2022-07-19 | Discharge: 2022-07-19 | Disposition: A | Discharge status: Home / Self Care | Payer: No Typology Code available for payment source | Source: Ambulatory Visit | Attending: Allergy & Immunology | Admitting: Allergy & Immunology

## 2022-07-19 VITALS — BP 100/60 | HR 94 | Temp 97.8°F | Resp 18 | Ht 66.0 in | Wt 155.6 lb

## 2022-07-19 DIAGNOSIS — Z91018 Allergy to other foods: Secondary | ICD-10-CM

## 2022-07-19 DIAGNOSIS — J3089 Other allergic rhinitis: Secondary | ICD-10-CM | POA: Diagnosis not present

## 2022-07-19 DIAGNOSIS — R5383 Other fatigue: Secondary | ICD-10-CM

## 2022-07-19 DIAGNOSIS — K219 Gastro-esophageal reflux disease without esophagitis: Secondary | ICD-10-CM

## 2022-07-19 DIAGNOSIS — J302 Other seasonal allergic rhinitis: Secondary | ICD-10-CM

## 2022-07-19 DIAGNOSIS — B999 Unspecified infectious disease: Secondary | ICD-10-CM

## 2022-07-19 LAB — FOLATE: Folate: 14.6 ng/mL (ref >5.9)

## 2022-07-19 LAB — IRON AND TIBC
Iron: 55 ug/dL (ref 28–170)
Saturation Ratios: 18 % (ref 10.4–31.8)
TIBC: 311 ug/dL (ref 250–450)
UIBC: 256 ug/dL

## 2022-07-19 LAB — VITAMIN B12: Vitamin B-12: 557 pg/mL (ref 180–914)

## 2022-07-19 MED ORDER — EPINEPHRINE 0.3 MG/0.3ML IJ SOAJ: 0.3000 mg | INTRAMUSCULAR | 1 refills | Status: AC | PRN

## 2022-07-19 MED ORDER — FLUTICASONE PROPIONATE 50 MCG/ACT NA SUSP
1.0000 | Freq: Two times a day (BID) | NASAL | 11 refills | Status: DC | PRN
  Filled 2022-07-19: qty 16, 60d supply, fill #0
  Filled 2022-11-13: qty 16, 60d supply, fill #1

## 2022-07-19 NOTE — Patient Instructions (Addendum)
Allergic rhinitis Continue allergen avoidance measures directed toward pollens, pets, and molds as listed below Continue carbinoxamine 4 mg up to 4 times a day as needed for nasal symptoms Continue Flonase 2 sprays in each nostril once a day for nasal congestion.  In the right nostril, point the applicator out toward the right ear. In the left nostril, point the applicator out toward the left ear Continue saline nasal rinses as needed for nasal symptoms. Use this before any medicated nasal sprays for best result Begin Mucinex [phone] mg twice a day and increase fluid intake to thin out mucus for the next 5 days Continue Atrovent nasal spray 2 sprays in each nostril twice a day as needed for runny nose Call the clinic if your symptoms worsen or if you develop a fever  Allergic conjunctivitis A prescription for cromolyn has been called into the pharmacy for you.  Use cromolyn 1 to 2 drops in each eye up to 4 times a day as needed for red or itchy eyes.   Some over the counter eye drops include Pataday (olopatadine) one drop in each eye once a day as needed for red, itchy eyes OR Zaditor one drop in each eye twice a day as needed for red itchy eyes.  Alpha gal An order has been placed to recheck your alpha gal levels. We will call you when the result becomes available.  In case of an allergic reaction, take Benadryl 50 mg every 4 hours, and if life-threatening symptoms occur, inject with EpiPen 0.3 mg.  Reflux Continue dietary lifestyle modifications as listed below Continue lansoprazole as previously prescribed  Lethargy We have ordered some lab studies to help [place] evaluate your lethargy. We will call you when the results become available Refer to endocrinology for evaluation of adrenal function  Call the clinic if this treatment plan is not working well for you  Follow up in 2 months or sooner if needed.  Reducing Pollen Exposure The American Academy of Allergy, Asthma and Immunology  suggests the following steps to reduce your exposure to pollen during allergy seasons. Do not hang sheets or clothing out to dry; pollen may collect on these items. Do not mow lawns or spend time around freshly cut grass; mowing stirs up pollen. Keep windows closed at night.  Keep car windows closed while driving. Minimize morning activities outdoors, a time when pollen counts are usually at their highest. Stay indoors as much as possible when pollen counts or humidity is high and on windy days when pollen tends to remain in the air longer. Use air conditioning when possible.  Many air conditioners have filters that trap the pollen spores. Use a HEPA room air filter to remove pollen form the indoor air you breathe.  Control of Dog or Cat Allergen Avoidance is the best way to manage a dog or cat allergy. If you have a dog or cat and are allergic to dog or cats, consider removing the dog or cat from the home. If you have a dog or cat but don't want to find it a new home, or if your family wants a pet even though someone in the household is allergic, here are some strategies that may help keep symptoms at bay:  Keep the pet out of your bedroom and restrict it to only a few rooms. Be advised that keeping the dog or cat in only one room will not limit the allergens to that room. Don't pet, hug or kiss the dog or cat;  if you do, wash your hands with soap and water. High-efficiency particulate air (HEPA) cleaners run continuously in a bedroom or living room can reduce allergen levels over time. Regular use of a high-efficiency vacuum cleaner or a central vacuum can reduce allergen levels. Giving your dog or cat a bath at least once a week can reduce airborne allergen.  Control of Mold Allergen Mold and fungi can grow on a variety of surfaces provided certain temperature and moisture conditions exist.  Outdoor molds grow on plants, decaying vegetation and soil.  The major outdoor mold, Alternaria and  Cladosporium, are found in very high numbers during hot and dry conditions.  Generally, a late Summer - Fall peak is seen for common outdoor fungal spores.  Rain will temporarily lower outdoor mold spore count, but counts rise rapidly when the rainy period ends.  The most important indoor molds are Aspergillus and Penicillium.  Dark, humid and poorly ventilated basements are ideal sites for mold growth.  The next most common sites of mold growth are the bathroom and the kitchen.  Outdoor [employer] Use air conditioning and keep windows closed Avoid exposure to decaying vegetation. Avoid leaf raking. Avoid grain handling. Consider wearing a face mask if working in moldy areas.  Indoor Mold Control Maintain humidity below 50%. Clean washable surfaces with 5% bleach solution. Remove sources e.g. Contaminated carpets.

## 2022-07-20 ENCOUNTER — Encounter: Payer: Self-pay | Admitting: Family Medicine

## 2022-07-20 NOTE — Progress Notes (Signed)
[place], [address] [place] [address] Dept: [phone]  FOLLOW UP NOTE  Patient ID: Theresa [name], female    DOB: 1971-04-18  Age: 51 y.o. MRN: [id number] Date of Office Visit: 07/19/2022  Assessment  Chief Complaint: Alpha Gal Relapse (Patient states she ate Pork Belly x Sunday Ignacia Bayley x last night  /Fatigue x years/Dizziness x 1 1/2 mth)  HPI Theresa [name] is a 51 year old female who presents to the clinic for follow-up visit.  She was last seen in this clinic on 08/01/2021 by Gareth Morgan, FNP, for evaluation of allergic rhinitis, allergic conjunctivitis, alpha gal, and reflux. She reports that she has been feeling extreme fatigue over the last lear with an increase in her symptoms over the last 3 months. She reports that she has started taking Wegovy for weight management with GI side effects including vomiting. Over the last year she has required several antibiotics for illness including acute bacterial sinusitis, acute bacterial conjunctivitis, UTI in the setting of history of on-obstructive nephrolithiasis, and upper respiratory infection. One common theme among her most recent illness is dizziness and lethargy. At today's visit, she reports allergic rhintiis has been moderately well controlled with symptoms including clear rhinorrhea, nasal congestion, and postnasal drainage for which she continues carbinoxamine 4 mg once a day and occasionally uses Flonase.  She does report that she is out of Flonase at this time.  Allergic conjunctivitis is reported as well controlled with occasional over-the-counter allergy eyedrops.  Her last alpha gal testing was on 01/23/2019 and was performed at [facility] and indicated beef IgE 2.35, lamb/Martin IgE 0.21, pork IgE 0.84, and alpha gal IgE 5.07.  She reports that she frequently eats pork and beef products.  She reports that on Sunday she ate pork and was unable to work on Monday due to lethargy.  Later in the week she ate steak with bacon and  experienced shortness of breath during the night.  She does report that she took a small amount of prednisone for 2 days in a row and felt completely recharged and not lethargic.  She denies recent tick bites.  Her last immune screening was on 09/30/2020 and was largely normal, however, she was only protective to 6 out of 23 types of Streptococcus pneumonia.  She did receive a Pneumovax to boost her immune system with a lab test 1 month later indicating robust response.  Her current medications are listed in the chart.   Drug Allergies:  Allergies  Allergen Reactions   Alpha-Gal Anaphylaxis, Nausea And Vomiting and Shortness Of Breath   Heparin (Porcine) Other (See Comments)    Physical Exam: BP 100/60   Pulse 94   Temp 97.8 F (36.6 C)   Resp 18   Ht '5\' 6"'$  (1.676 m)   Wt 155 lb 9.6 oz (70.6 kg)   SpO2 98%   BMI 25.11 kg/m    Physical Exam Vitals reviewed.  Constitutional:      Appearance: Normal appearance.  HENT:     Head: Normocephalic and atraumatic.     Right Ear: Tympanic membrane normal.     Left Ear: Tympanic membrane normal.     Nose:     Comments: Slightly erythematous with clear nasal drainage noted.  Pharynx normal.  Ears normal.  Eyes normal.    Mouth/Throat:     Pharynx: Oropharynx is clear.  Eyes:     Conjunctiva/sclera: Conjunctivae normal.  Cardiovascular:     Rate and Rhythm: Normal rate and regular rhythm.  Heart sounds: Normal heart sounds. No murmur heard. Pulmonary:     Effort: Pulmonary effort is normal.     Breath sounds: Normal breath sounds.     Comments: Lungs clear to auscultation Musculoskeletal:        General: Normal range of motion.     Cervical back: Normal range of motion and neck supple.  Skin:    General: Skin is warm and dry.  Neurological:     Mental Status: She is alert and oriented to person, place, and time.  Psychiatric:        Mood and Affect: Mood normal.        Behavior: Behavior normal.        Thought Content:  Thought content normal.        Judgment: Judgment normal.     Assessment and Plan: 1. Other fatigue   2. Allergy to alpha-gal   3. Seasonal and perennial allergic rhinitis   4. Gastroesophageal reflux disease, unspecified whether esophagitis present   5. Recurrent infections     Meds ordered this encounter  Medications   EPINEPHrine (EPIPEN 2-PAK) 0.3 mg/0.3 mL IJ SOAJ injection    Sig: Inject 0.3 mg into the muscle as needed for anaphylaxis.    Dispense:  1 each    Refill:  1   fluticasone (FLONASE) 50 MCG/ACT nasal spray    Sig: Place 1 spray into both nostrils 2 (two) times daily as needed for allergies or rhinitis    Dispense:  16 g    Refill:  11    Patient Instructions  Allergic rhinitis Continue allergen avoidance measures directed toward pollens, pets, and molds as listed below Continue carbinoxamine 4 mg up to 4 times a day as needed for nasal symptoms Continue Flonase 2 sprays in each nostril once a day for nasal congestion.  In the right nostril, point the applicator out toward the right ear. In the left nostril, point the applicator out toward the left ear Continue saline nasal rinses as needed for nasal symptoms. Use this before any medicated nasal sprays for best result Begin Mucinex [phone] mg twice a day and increase fluid intake to thin out mucus for the next 5 days Continue Atrovent nasal spray 2 sprays in each nostril twice a day as needed for runny nose Call the clinic if your symptoms worsen or if you develop a fever  Allergic conjunctivitis A prescription for cromolyn has been called into the pharmacy for you.  Use cromolyn 1 to 2 drops in each eye up to 4 times a day as needed for red or itchy eyes.   Some over the counter eye drops include Pataday (olopatadine) one drop in each eye once a day as needed for red, itchy eyes OR Zaditor one drop in each eye twice a day as needed for red itchy eyes.  Alpha gal An order has been placed to recheck your alpha  gal levels. We will call you when the result becomes available.  In case of an allergic reaction, take Benadryl 50 mg every 4 hours, and if life-threatening symptoms occur, inject with EpiPen 0.3 mg.  Reflux Continue dietary lifestyle modifications as listed below Continue lansoprazole as previously prescribed  Fatigue We have ordered some lab studies to help [place] evaluate your lethargy/fatigue. We will call you when the results become available Refer to endocrinology for evaluation of adrenal function  Recurrent infection Continue to keep track of infections, antibiotics, and prednisone use Call the clinic if this treatment  plan is not working well for you  Follow up in 2 months or sooner if needed.   Return in about 2 months (around 09/19/2022), or if symptoms worsen or fail to improve.    Thank you for the opportunity to care for this patient.  Please do not hesitate to contact me with questions.  Gareth Morgan, FNP [facility] [place]

## 2022-07-21 LAB — VITAMIN B6: Vitamin B6: 27.3 ug/L (ref 3.4–65.2)

## 2022-07-21 NOTE — Telephone Encounter (Signed)
Can you please let this patient know that the endocrinologist will most likely draw cortisol levels using different states of fasting or timing in the day. Thank you

## 2022-07-22 LAB — MISC LABCORP TEST (SEND OUT): Labcorp test code: 650003

## 2022-07-24 NOTE — Progress Notes (Signed)
Can you please let this patient know that her lab work was within normal limits. Thank you

## 2022-07-27 ENCOUNTER — Encounter: Payer: Self-pay | Admitting: Internal Medicine

## 2022-07-27 ENCOUNTER — Ambulatory Visit (INDEPENDENT_AMBULATORY_CARE_PROVIDER_SITE_OTHER): Payer: No Typology Code available for payment source | Admitting: Internal Medicine

## 2022-07-27 VITALS — BP 107/76 | HR 93 | Ht 66.0 in | Wt 153.2 lb

## 2022-07-27 DIAGNOSIS — Z79899 Other long term (current) drug therapy: Secondary | ICD-10-CM

## 2022-07-27 DIAGNOSIS — F411 Generalized anxiety disorder: Secondary | ICD-10-CM

## 2022-07-27 DIAGNOSIS — R5382 Chronic fatigue, unspecified: Secondary | ICD-10-CM

## 2022-07-27 DIAGNOSIS — M797 Fibromyalgia: Secondary | ICD-10-CM

## 2022-07-27 DIAGNOSIS — J0111 Acute recurrent frontal sinusitis: Secondary | ICD-10-CM | POA: Diagnosis not present

## 2022-07-27 MED ORDER — AMOXICILLIN-POT CLAVULANATE 875-125 MG PO TABS: 1.0000 | ORAL_TABLET | Freq: Two times a day (BID) | ORAL | 0 refills | Status: DC

## 2022-07-27 NOTE — Patient Instructions (Signed)
Please start taking Augmentin as prescribed. Please take Prednisone 10 mg for 5 days.  Please continue taking other medications as prescribed.

## 2022-07-27 NOTE — Assessment & Plan Note (Signed)
Chronic fatigue, likely due to fibromyalgia and/or dehydration Has stopped Wegovy as it was causing severe vomiting Had excellent response with oral steroid, concern for autoimmune etiology Check CBC, CMP, TSH, ANA, ESR, rheumatoid factor, antidsDNA

## 2022-07-27 NOTE — Assessment & Plan Note (Signed)
Has recent worsening of sinus symptoms for the last 1 week Started empiric Augmentin Has chronic rhinitis, followed by Allergy and Immunology Used to get allergy shots every week. Uses flonase Advised to take prednisone 10 mg QD X 5 days

## 2022-07-27 NOTE — Assessment & Plan Note (Signed)
Well-controlled with Xanax 1 mg daily and Celexa Had switched to Cymbalta for benefit with fibromyalgia, but did not tolerate it well

## 2022-07-27 NOTE — Assessment & Plan Note (Addendum)
Had Rheumatology evaluation in 2019, was told of fibromyalgia, but did not start any treatment at that time Had started Cymbalta, but did not tolerate it If persistent symptoms or insomnia, may switch to Elavil - she prefers to stay with Celexa Was referred to Rheumatology, but they do not manage fibromyalgia - her Ob.Gyn. has tried to send referral again

## 2022-07-27 NOTE — Progress Notes (Signed)
Established Patient Office Visit  Subjective:  Patient ID: Theresa [name], female    DOB: [birth year]  Age: 51 y.o. MRN: [id number]  CC:  Chief Complaint  Patient presents with   Annual Exam    Patient states over two months she has missed two weeks of work, ended up in the ER for possibly being dehydration, severe fatigued, dizziness.Patient states the fatigued and dizziness is getting to the point where she can not function  She also states she has had a sinus infection since Friday.    HPI Theresa [name] is a 51 y.o. female with past medical history of GERD, chronic rhinitis, GAD, fibromyalgia and prediabetes who presents for annual physical.  She complains of severe fatigue, dizziness and arthralgias, which has led to missing work multiple times in the last few weeks.  She denies any fever or chills.  She has stopped taking Wegovy as she was having persistent nausea, vomiting and epigastric pain with it.  Her nausea and vomiting have resolved now.  She has history of fibromyalgia, and was given trial of Cymbalta, but she did not tolerate it.  She has tried taking prednisone for 2 days with excellent response for her fatigue.  She also complains of recent worsening of her nasal congestion, sinus pressure and headache for the last 1 week.  She denies any fever or chills.  She has history of allergic sinusitis, and uses Flonase or Atrovent nasal spray for it.  She has been taking Celexa and Xanax as needed for anxiety.  Her sleep has improved now.    Past Medical History:  Diagnosis Date   Allergy    Allergy to alpha-gal    Anxiety    Colon adenoma DEC 2014   ONE(7 MM) SIMPLE([place])   Concussion with loss of consciousness 02/17/2019   Depression    Edema    GERD (gastroesophageal reflux disease)    History of abnormal cervical Pap smear 12/02/2013   History of nephrolithiasis    HPV (human papilloma virus) infection    Hyperlipidemia    IBS (irritable bowel syndrome)    constipation    Insomnia    Lymphadenopathy 07/12/2020   Obesity 01/14/2014   Positive test for Epstein-Barr virus (EBV) 08/21/2017   Pre-diabetes 09/2014   Superficial basal cell carcinoma (BCC) 12/26/2019   Right Breast- treatment Imiquimoid   Yeast infection 09/04/2013    Past Surgical History:  Procedure Laterality Date   BIOPSY N/A 08/01/2013   Procedure: BIOPSY;  Surgeon: Danie Binder, MD;  [facility];  Service: Endoscopy;  Laterality: N/A;  FOR MICROSCOPIC COLITIS   BIOPSY  08/09/2018   Procedure: BIOPSY;  Surgeon: Rogene Houston, MD;  [facility]: [facility];  Service: Endoscopy;;  gastric   BREAST REDUCTION SURGERY  2009   BREAST SURGERY  2007   breast reduction   CHOLECYSTECTOMY N/A 11/04/2014   Procedure: LAPAROSCOPIC CHOLECYSTECTOMY;  Surgeon: Aviva Signs Md, MD;  Location: [facility];  Service: General;  Laterality: N/A;   COLONOSCOPY  Oct 2009   Dr. Carlean Purl: int/ext hemorrhoids, ?mild proctitis but path benign, likely r/t irritation from bowel prep   COLONOSCOPY N/A 08/01/2013   Procedure: COLONOSCOPY;  Surgeon: Danie Binder, MD;  [facility]: [facility];  Service: Endoscopy;  Laterality: N/A;  12:00   COLONOSCOPY WITH PROPOFOL N/A 08/09/2018   Procedure: COLONOSCOPY WITH PROPOFOL;  Surgeon: Rogene Houston, MD;  [facility];  Service: Endoscopy;  Laterality: N/A;  730  ESOPHAGOGASTRODUODENOSCOPY  Dec 2009   Dr. Carlean Purl: reflux esophagitis, proximal gastric polyps, benign   ESOPHAGOGASTRODUODENOSCOPY N/A 12/18/2014   Procedure: ESOPHAGOGASTRODUODENOSCOPY (EGD);  Surgeon: Rogene Houston, MD;  [facility];  Service: Endoscopy;  Laterality: N/A;  230   ESOPHAGOGASTRODUODENOSCOPY (EGD) WITH PROPOFOL N/A 08/09/2018   Procedure: ESOPHAGOGASTRODUODENOSCOPY (EGD) WITH PROPOFOL;  Surgeon: Rogene Houston, MD;  [facility];  Service: Endoscopy;  Laterality: N/A;   HEMORRHOID BANDING N/A 08/01/2013   Procedure: HEMORRHOID BANDING;  Surgeon: Danie Binder, MD;  [facility];  Service: Endoscopy;  Laterality: N/A;  internal hemorrhoid banding   LIVER BIOPSY N/A 11/04/2014   Procedure: LIVER BIOPSY;  Surgeon: Aviva Signs Md, MD;  Location: [facility];  Service: General;  Laterality: N/A;   POLYPECTOMY  2015   Dr.Fields   POLYPECTOMY  08/09/2018   Procedure: POLYPECTOMY;  Surgeon: Rogene Houston, MD;  [facility];  Service: Endoscopy;;  colon   REDUCTION MAMMAPLASTY Bilateral 2006   UPPER GASTROINTESTINAL ENDOSCOPY  2006   Dr. Nita Sickle in [place]    Family History  Problem Relation Age of Onset   Cancer Mother        head and neck   Hypertension Father    Hyperlipidemia Father    Pancreatic cancer Maternal Grandfather    Colon cancer Neg Hx     Social History   Socioeconomic History   Marital status: Married    Spouse name: Not on file   Number of children: 1   Years of education: Not on file   Highest education level: Not on file  Occupational History   Occupation: Therapist, sports    Employer: [employer]  Tobacco Use   Smoking status: Never   Smokeless tobacco: Never   Tobacco comments:    Never smoker  Vaping Use   Vaping Use: Never used  Substance and Sexual Activity   Alcohol use: Yes    Alcohol/week: 0.0 standard drinks of alcohol    Comment: socially   Drug use: No   Sexual activity: Yes    Birth control/protection: I.U.D.  Other Topics Concern   Not on file  Social History Narrative   Not on file   Social Determinants of Health   Financial Resource Strain: Not on file  Food Insecurity: Not on file  Transportation Needs: Not on file  Physical Activity: Not on file  Stress: Not on file  Social Connections: Not on file  Intimate Partner Violence: Not on file    Outpatient Medications Prior to Visit  Medication Sig Dispense Refill   ALPRAZolam (XANAX) 1 MG tablet Take 1 tablet by mouth at bedtime as needed for anxiety. 30 tablet 2   Carbinoxamine Maleate 4 MG TABS Take 1 tablet by  mouth 2 times daily as needed. 60 tablet 5   citalopram (CELEXA) 40 MG tablet Take 1 tablet (40 mg total) by mouth daily. 90 tablet 1   cycloSPORINE (RESTASIS) 0.05 % ophthalmic emulsion Place 1 drop into both eyes 2 (two) times daily. 180 mL 4   EPINEPHrine (EPIPEN 2-PAK) 0.3 mg/0.3 mL IJ SOAJ injection Inject 0.3 mg into the muscle as needed for anaphylaxis. 1 each 1   fluticasone (FLONASE) 50 MCG/ACT nasal spray Place 1 spray into both nostrils 2 (two) times daily as needed for allergies or rhinitis 16 g 11   ipratropium (ATROVENT) 0.06 % nasal spray Place 2 sprays into both nostrils 3 (three) times daily. 15 mL 0  lansoprazole (PREVACID) 30 MG capsule Take 1 capsule (30 mg total) by mouth daily at 12 noon. 90 capsule 0   levonorgestrel (MIRENA) 20 MCG/24HR IUD 1 each by Intrauterine route once.     ofloxacin (OCUFLOX) 0.3 % ophthalmic solution Place 1 drop into both eyes 4 (four) times daily. 5 mL 0   predniSONE (DELTASONE) 10 MG tablet Take 1 tablet (10 mg total) by mouth daily as needed for up to 30 doses. Take one tablet one hour before allergy shots. 30 tablet 1   Semaglutide-Weight Management 2.4 MG/0.75ML SOAJ Inject 2.4 mg into the skin once a week. 3 mL 5   No facility-administered medications prior to visit.    Allergies  Allergen Reactions   Alpha-Gal Anaphylaxis, Nausea And Vomiting and Shortness Of Breath   Heparin (Porcine) Other (See Comments)    ROS Review of Systems  Constitutional:  Positive for fatigue. Negative for chills and fever.  HENT:  Negative for congestion, sinus pressure, sinus pain and sore throat.   Eyes:  Negative for pain and discharge.  Respiratory:  Negative for cough and shortness of breath.   Cardiovascular:  Negative for chest pain and palpitations.  Gastrointestinal:  Positive for constipation. Negative for abdominal pain, nausea and vomiting.  Endocrine: Negative for polydipsia and polyuria.  Genitourinary:  Negative for dysuria and hematuria.   Musculoskeletal:  Positive for arthralgias and myalgias. Negative for neck stiffness.  Skin:  Negative for rash.  Allergic/Immunologic: Positive for environmental allergies.  Neurological:  Negative for dizziness and weakness.  Psychiatric/Behavioral:  Negative for agitation and behavioral problems. The patient is nervous/anxious.       Objective:    Physical Exam Vitals reviewed.  Constitutional:      General: She is not in acute distress.    Appearance: She is not diaphoretic.  HENT:     Head: Normocephalic and atraumatic.     Nose: Nose normal.     Mouth/Throat:     Mouth: Mucous membranes are moist.  Eyes:     General: No scleral icterus.    Extraocular Movements: Extraocular movements intact.  Cardiovascular:     Rate and Rhythm: Normal rate and regular rhythm.     Pulses: Normal pulses.     Heart sounds: Normal heart sounds. No murmur heard. Pulmonary:     Breath sounds: Normal breath sounds. No wheezing or rales.  Abdominal:     Palpations: Abdomen is soft.     Tenderness: There is no abdominal tenderness.  Musculoskeletal:     Cervical back: Neck supple. No tenderness.     Right lower leg: No edema.     Left lower leg: No edema.  Skin:    General: Skin is warm.     Findings: No rash.  Neurological:     General: No focal deficit present.     Mental Status: She is alert and oriented to person, place, and time.     Sensory: No sensory deficit.     Motor: No weakness.  Psychiatric:        Mood and Affect: Mood normal.        Behavior: Behavior normal.     BP 107/76 (BP Location: Right Arm, Patient Position: Sitting, Cuff Size: Normal)   Pulse 93   Ht _0  (1.676 m)   Wt 153 lb 3.2 oz (69.5 kg)   SpO2 98%   BMI 24.73 kg/m  Wt Readings from Last 3 Encounters:  07/27/22 153 lb 3.2 oz (69.5 kg)  07/19/22 155 lb 9.6 oz (70.6 kg)  06/01/22 159 lb (72.1 kg)    Lab Results  Component Value Date   TSH 2.332 06/01/2022   Lab Results  Component Value  Date   WBC 5.9 06/01/2022   HGB 14.4 06/01/2022   HCT 43.6 06/01/2022   MCV 91.4 06/01/2022   PLT 314 06/01/2022   Lab Results  Component Value Date   NA 137 06/01/2022   K 3.5 06/01/2022   CO2 24 06/01/2022   GLUCOSE 89 06/01/2022   BUN 7 06/01/2022   CREATININE 0.94 06/01/2022   BILITOT 0.7 06/01/2022   ALKPHOS 72 06/01/2022   AST 16 06/01/2022   ALT 14 06/01/2022   PROT 7.2 06/01/2022   ALBUMIN 3.7 06/01/2022   CALCIUM 8.7 (L) 06/01/2022   ANIONGAP 8 06/01/2022   EGFR 84 11/11/2021   Lab Results  Component Value Date   CHOL 257 (H) 11/11/2021   Lab Results  Component Value Date   HDL 46 11/11/2021   Lab Results  Component Value Date   LDLCALC 180 (H) 11/11/2021   Lab Results  Component Value Date   TRIG 166 (H) 11/11/2021   Lab Results  Component Value Date   CHOLHDL 5.6 (H) 11/11/2021   Lab Results  Component Value Date   HGBA1C 5.8 (H) 11/11/2021      Assessment & Plan:   Problem List Items Addressed This Visit       Respiratory   Acute recurrent frontal sinusitis    Has recent worsening of sinus symptoms for the last 1 week Started empiric Augmentin Has chronic rhinitis, followed by Allergy and Immunology Used to get allergy shots every week. Uses flonase Advised to take prednisone 10 mg QD X 5 days      Relevant Medications   amoxicillin-clavulanate (AUGMENTIN) 875-125 MG tablet     Other   Generalized anxiety disorder    Well-controlled with Xanax 1 mg daily and Celexa Had switched to Cymbalta for benefit with fibromyalgia, but did not tolerate it well      Fatigue - Primary    Chronic fatigue, likely due to fibromyalgia and/or dehydration Has stopped Wegovy as it was causing severe vomiting Had excellent response with oral steroid, concern for autoimmune etiology Check CBC, CMP, TSH, ANA, ESR, rheumatoid factor, antidsDNA      Relevant Orders   ANA w/Reflex   Sed Rate (ESR)   Rheumatoid Factor   Anti-DNA antibody,  double-stranded   Fibromyalgia    Had Rheumatology evaluation in 2019, was told of fibromyalgia, but did not start any treatment at that time Had started Cymbalta, but did not tolerate it If persistent symptoms or insomnia, may switch to Elavil - she prefers to stay with Celexa Was referred to Rheumatology, but they do not manage fibromyalgia - her Ob.Gyn. has tried to send referral again      Relevant Orders   ANA w/Reflex   Sed Rate (ESR)   Rheumatoid Factor   Anti-DNA antibody, double-stranded   Other Visit Diagnoses     Chronic prescription benzodiazepine use       Relevant Orders   ToxASSURE Select 13 (MW), Urine       Meds ordered this encounter  Medications   amoxicillin-clavulanate (AUGMENTIN) 875-125 MG tablet    Sig: Take 1 tablet by mouth 2 (two) times daily.    Dispense:  14 tablet    Refill:  0    Follow-up: Return in about 4 months (around 11/26/2022) for  GAD.    Lindell Spar, MD

## 2022-07-28 LAB — CBC WITH DIFFERENTIAL/PLATELET
Basophils Absolute: 0 10*3/uL (ref 0.0–0.2)
Basos: 1 %
EOS (ABSOLUTE): 0.1 10*3/uL (ref 0.0–0.4)
Eos: 3 %
Hematocrit: 43.4 % (ref 34.0–46.6)
Hemoglobin: 14.8 g/dL (ref 11.1–15.9)
Immature Grans (Abs): 0 10*3/uL (ref 0.0–0.1)
Immature Granulocytes: 0 %
Lymphocytes Absolute: 1.7 10*3/uL (ref 0.7–3.1)
Lymphs: 42 %
MCH: 30.3 pg (ref 26.6–33.0)
MCHC: 34.1 g/dL (ref 31.5–35.7)
MCV: 89 fL (ref 79–97)
Monocytes Absolute: 0.3 10*3/uL (ref 0.1–0.9)
Monocytes: 7 %
Neutrophils Absolute: 1.9 10*3/uL (ref 1.4–7.0)
Neutrophils: 47 %
Platelets: 271 10*3/uL (ref 150–450)
RBC: 4.89 x10E6/uL (ref 3.77–5.28)
RDW: 12.3 % (ref 11.7–15.4)
WBC: 4.1 10*3/uL (ref 3.4–10.8)

## 2022-07-28 LAB — LIPID PANEL
Chol/HDL Ratio: 4.6 ratio — ABNORMAL HIGH (ref 0.0–4.4)
Cholesterol, Total: 161 mg/dL (ref 100–199)
HDL: 35 mg/dL — ABNORMAL LOW (ref >39)
LDL Chol Calc (NIH): 104 mg/dL — ABNORMAL HIGH (ref 0–99)
Triglycerides: 120 mg/dL (ref 0–149)
VLDL Cholesterol Cal: 22 mg/dL (ref 5–40)

## 2022-07-28 LAB — TSH: TSH: 1.68 u[IU]/mL (ref 0.450–4.500)

## 2022-07-28 LAB — CMP14+EGFR
ALT: 16 IU/L (ref 0–32)
AST: 14 IU/L (ref 0–40)
Albumin/Globulin Ratio: 1.7 (ref 1.2–2.2)
Albumin: 4.3 g/dL (ref 3.9–4.9)
Alkaline Phosphatase: 76 IU/L (ref 44–121)
BUN/Creatinine Ratio: 11 (ref 9–23)
BUN: 9 mg/dL (ref 6–24)
Bilirubin Total: 0.5 mg/dL (ref 0.0–1.2)
CO2: 24 mmol/L (ref 20–29)
Calcium: 9.2 mg/dL (ref 8.7–10.2)
Chloride: 103 mmol/L (ref 96–106)
Creatinine, Ser: 0.79 mg/dL (ref 0.57–1.00)
Globulin, Total: 2.5 g/dL (ref 1.5–4.5)
Glucose: 80 mg/dL (ref 70–99)
Potassium: 3.9 mmol/L (ref 3.5–5.2)
Sodium: 140 mmol/L (ref 134–144)
Total Protein: 6.8 g/dL (ref 6.0–8.5)
eGFR: 91 mL/min/{1.73_m2} (ref >59)

## 2022-07-28 LAB — HEMOGLOBIN A1C
Est. average glucose Bld gHb Est-mCnc: 108 mg/dL
Hgb A1c MFr Bld: 5.4 % (ref 4.8–5.6)

## 2022-07-28 LAB — VITAMIN D 25 HYDROXY (VIT D DEFICIENCY, FRACTURES): Vit D, 25-Hydroxy: 53.4 ng/mL (ref 30.0–100.0)

## 2022-07-29 LAB — ANTI-DNA ANTIBODY, DOUBLE-STRANDED: dsDNA Ab: 1 IU/mL (ref 0–9)

## 2022-07-29 LAB — ANA W/REFLEX: Anti Nuclear Antibody (ANA): NEGATIVE

## 2022-07-29 LAB — SEDIMENTATION RATE: Sed Rate: 2 mm/hr (ref 0–40)

## 2022-07-29 LAB — RHEUMATOID FACTOR: Rheumatoid fact SerPl-aCnc: <10 IU/mL (ref <14.0)

## 2022-07-31 ENCOUNTER — Encounter: Payer: Self-pay | Admitting: Internal Medicine

## 2022-07-31 ENCOUNTER — Other Ambulatory Visit: Payer: Self-pay | Admitting: Internal Medicine

## 2022-07-31 ENCOUNTER — Other Ambulatory Visit (HOSPITAL_COMMUNITY): Payer: Self-pay

## 2022-07-31 ENCOUNTER — Other Ambulatory Visit: Payer: Self-pay

## 2022-07-31 DIAGNOSIS — M797 Fibromyalgia: Secondary | ICD-10-CM

## 2022-07-31 MED ORDER — AMITRIPTYLINE HCL 10 MG PO TABS
10.0000 mg | ORAL_TABLET | Freq: Every day | ORAL | 0 refills | Status: DC
  Filled 2022-07-31: qty 30, 30d supply, fill #0

## 2022-08-02 LAB — TOXASSURE SELECT 13 (MW), URINE

## 2022-08-03 ENCOUNTER — Other Ambulatory Visit: Payer: Self-pay

## 2022-08-03 ENCOUNTER — Other Ambulatory Visit (HOSPITAL_COMMUNITY): Payer: Self-pay

## 2022-08-07 ENCOUNTER — Encounter: Payer: Self-pay | Admitting: Internal Medicine

## 2022-08-08 ENCOUNTER — Other Ambulatory Visit (HOSPITAL_COMMUNITY): Payer: Self-pay

## 2022-08-08 ENCOUNTER — Other Ambulatory Visit: Payer: Self-pay

## 2022-08-08 ENCOUNTER — Other Ambulatory Visit: Payer: Self-pay | Admitting: Internal Medicine

## 2022-08-08 DIAGNOSIS — F411 Generalized anxiety disorder: Secondary | ICD-10-CM

## 2022-08-08 MED ORDER — CITALOPRAM HYDROBROMIDE 40 MG PO TABS
40.0000 mg | ORAL_TABLET | Freq: Every day | ORAL | 1 refills | Status: DC
  Filled 2022-08-08: qty 90, 90d supply, fill #0
  Filled 2022-11-01: qty 90, 90d supply, fill #1

## 2022-08-08 MED ORDER — ALPRAZOLAM 1 MG PO TABS
1.0000 mg | ORAL_TABLET | Freq: Two times a day (BID) | ORAL | 2 refills | Status: DC | PRN
  Filled 2022-08-08 – 2022-08-18 (×3): qty 60, 30d supply, fill #0
  Filled 2022-09-20: qty 60, 30d supply, fill #1
  Filled 2022-10-19 – 2022-10-23 (×2): qty 60, 30d supply, fill #2

## 2022-08-18 ENCOUNTER — Other Ambulatory Visit (HOSPITAL_COMMUNITY): Payer: Self-pay

## 2022-08-18 ENCOUNTER — Other Ambulatory Visit (INDEPENDENT_AMBULATORY_CARE_PROVIDER_SITE_OTHER): Payer: Self-pay | Admitting: Gastroenterology

## 2022-08-18 ENCOUNTER — Other Ambulatory Visit: Payer: Self-pay

## 2022-08-18 MED ORDER — LANSOPRAZOLE 30 MG PO CPDR
30.0000 mg | DELAYED_RELEASE_CAPSULE | Freq: Every day | ORAL | 0 refills | Status: DC
  Filled 2022-08-18: qty 30, 30d supply, fill #0

## 2022-08-19 ENCOUNTER — Other Ambulatory Visit (HOSPITAL_COMMUNITY): Payer: Self-pay

## 2022-08-21 ENCOUNTER — Other Ambulatory Visit (HOSPITAL_COMMUNITY): Payer: Self-pay

## 2022-08-28 ENCOUNTER — Ambulatory Visit (INDEPENDENT_AMBULATORY_CARE_PROVIDER_SITE_OTHER): Payer: No Typology Code available for payment source | Admitting: Gastroenterology

## 2022-09-01 ENCOUNTER — Other Ambulatory Visit (HOSPITAL_COMMUNITY): Payer: Self-pay

## 2022-09-01 ENCOUNTER — Other Ambulatory Visit: Payer: Self-pay | Admitting: Internal Medicine

## 2022-09-01 ENCOUNTER — Encounter: Payer: Self-pay | Admitting: Internal Medicine

## 2022-09-01 ENCOUNTER — Other Ambulatory Visit: Payer: Self-pay

## 2022-09-01 DIAGNOSIS — E669 Obesity, unspecified: Secondary | ICD-10-CM

## 2022-09-01 MED ORDER — WEGOVY 1.7 MG/0.75ML ~~LOC~~ SOAJ
1.7000 mg | SUBCUTANEOUS | 3 refills | Status: DC
  Filled 2022-09-01: qty 3, 28d supply, fill #0
  Filled 2022-09-27: qty 3, 28d supply, fill #1
  Filled 2022-10-13 – 2022-10-20 (×3): qty 3, 28d supply, fill #2
  Filled 2022-11-20: qty 3, 28d supply, fill #3

## 2022-09-12 ENCOUNTER — Other Ambulatory Visit (INDEPENDENT_AMBULATORY_CARE_PROVIDER_SITE_OTHER): Payer: Self-pay | Admitting: Gastroenterology

## 2022-09-12 MED ORDER — LANSOPRAZOLE 30 MG PO CPDR
30.0000 mg | DELAYED_RELEASE_CAPSULE | Freq: Every day | ORAL | 0 refills | Status: DC
  Filled 2022-09-12: qty 30, 30d supply, fill #0

## 2022-09-13 ENCOUNTER — Other Ambulatory Visit: Payer: Self-pay

## 2022-09-20 ENCOUNTER — Other Ambulatory Visit (HOSPITAL_COMMUNITY): Payer: Self-pay

## 2022-09-20 ENCOUNTER — Other Ambulatory Visit: Payer: Self-pay

## 2022-09-27 ENCOUNTER — Other Ambulatory Visit (HOSPITAL_COMMUNITY): Payer: Self-pay

## 2022-09-27 DIAGNOSIS — S134XXA Sprain of ligaments of cervical spine, initial encounter: Secondary | ICD-10-CM | POA: Diagnosis not present

## 2022-09-27 DIAGNOSIS — S233XXA Sprain of ligaments of thoracic spine, initial encounter: Secondary | ICD-10-CM | POA: Diagnosis not present

## 2022-09-27 DIAGNOSIS — S338XXA Sprain of other parts of lumbar spine and pelvis, initial encounter: Secondary | ICD-10-CM | POA: Diagnosis not present

## 2022-10-02 DIAGNOSIS — S233XXA Sprain of ligaments of thoracic spine, initial encounter: Secondary | ICD-10-CM | POA: Diagnosis not present

## 2022-10-02 DIAGNOSIS — S338XXA Sprain of other parts of lumbar spine and pelvis, initial encounter: Secondary | ICD-10-CM | POA: Diagnosis not present

## 2022-10-02 DIAGNOSIS — S134XXA Sprain of ligaments of cervical spine, initial encounter: Secondary | ICD-10-CM | POA: Diagnosis not present

## 2022-10-09 DIAGNOSIS — S134XXA Sprain of ligaments of cervical spine, initial encounter: Secondary | ICD-10-CM | POA: Diagnosis not present

## 2022-10-09 DIAGNOSIS — S338XXA Sprain of other parts of lumbar spine and pelvis, initial encounter: Secondary | ICD-10-CM | POA: Diagnosis not present

## 2022-10-09 DIAGNOSIS — S233XXA Sprain of ligaments of thoracic spine, initial encounter: Secondary | ICD-10-CM | POA: Diagnosis not present

## 2022-10-10 ENCOUNTER — Other Ambulatory Visit (INDEPENDENT_AMBULATORY_CARE_PROVIDER_SITE_OTHER): Payer: Self-pay | Admitting: Gastroenterology

## 2022-10-11 ENCOUNTER — Other Ambulatory Visit (HOSPITAL_COMMUNITY): Payer: Self-pay

## 2022-10-13 ENCOUNTER — Other Ambulatory Visit (HOSPITAL_COMMUNITY): Payer: Self-pay

## 2022-10-13 ENCOUNTER — Encounter: Payer: Self-pay | Admitting: Internal Medicine

## 2022-10-19 ENCOUNTER — Other Ambulatory Visit (INDEPENDENT_AMBULATORY_CARE_PROVIDER_SITE_OTHER): Payer: Self-pay | Admitting: Gastroenterology

## 2022-10-19 ENCOUNTER — Other Ambulatory Visit: Payer: Self-pay

## 2022-10-19 ENCOUNTER — Other Ambulatory Visit (HOSPITAL_COMMUNITY): Payer: Self-pay

## 2022-10-21 ENCOUNTER — Other Ambulatory Visit (HOSPITAL_COMMUNITY): Payer: Self-pay

## 2022-10-23 ENCOUNTER — Other Ambulatory Visit (INDEPENDENT_AMBULATORY_CARE_PROVIDER_SITE_OTHER): Payer: Self-pay | Admitting: Gastroenterology

## 2022-10-23 ENCOUNTER — Other Ambulatory Visit (HOSPITAL_BASED_OUTPATIENT_CLINIC_OR_DEPARTMENT_OTHER): Payer: Self-pay

## 2022-10-23 ENCOUNTER — Other Ambulatory Visit (HOSPITAL_COMMUNITY): Payer: Self-pay

## 2022-10-23 ENCOUNTER — Other Ambulatory Visit: Payer: Self-pay

## 2022-10-24 ENCOUNTER — Other Ambulatory Visit: Payer: Self-pay

## 2022-10-24 ENCOUNTER — Other Ambulatory Visit (HOSPITAL_COMMUNITY): Payer: Self-pay

## 2022-10-25 ENCOUNTER — Other Ambulatory Visit (HOSPITAL_COMMUNITY): Payer: Self-pay

## 2022-11-01 ENCOUNTER — Other Ambulatory Visit: Payer: Self-pay

## 2022-11-01 ENCOUNTER — Encounter (INDEPENDENT_AMBULATORY_CARE_PROVIDER_SITE_OTHER): Payer: Self-pay

## 2022-11-01 ENCOUNTER — Other Ambulatory Visit (INDEPENDENT_AMBULATORY_CARE_PROVIDER_SITE_OTHER): Payer: Self-pay | Admitting: Gastroenterology

## 2022-11-01 ENCOUNTER — Encounter: Payer: Self-pay | Admitting: Internal Medicine

## 2022-11-01 ENCOUNTER — Other Ambulatory Visit (HOSPITAL_COMMUNITY): Payer: Self-pay

## 2022-11-01 MED ORDER — LANSOPRAZOLE 30 MG PO CPDR
30.0000 mg | DELAYED_RELEASE_CAPSULE | Freq: Every day | ORAL | 0 refills | Status: DC
  Filled 2022-11-01: qty 90, 90d supply, fill #0

## 2022-11-01 MED ORDER — LANSOPRAZOLE 30 MG PO CPDR: 30.0000 mg | DELAYED_RELEASE_CAPSULE | Freq: Every day | ORAL | 0 refills | Status: DC

## 2022-11-02 ENCOUNTER — Encounter (HOSPITAL_COMMUNITY): Payer: Self-pay

## 2022-11-02 ENCOUNTER — Other Ambulatory Visit (HOSPITAL_COMMUNITY): Payer: Self-pay

## 2022-11-14 ENCOUNTER — Other Ambulatory Visit: Payer: Self-pay

## 2022-11-21 ENCOUNTER — Other Ambulatory Visit: Payer: Self-pay

## 2022-11-28 ENCOUNTER — Other Ambulatory Visit: Payer: Self-pay | Admitting: Internal Medicine

## 2022-11-28 ENCOUNTER — Other Ambulatory Visit (HOSPITAL_COMMUNITY): Payer: Self-pay

## 2022-11-28 ENCOUNTER — Other Ambulatory Visit: Payer: Self-pay

## 2022-11-28 DIAGNOSIS — F411 Generalized anxiety disorder: Secondary | ICD-10-CM

## 2022-11-28 MED ORDER — ALPRAZOLAM 1 MG PO TABS
1.0000 mg | ORAL_TABLET | Freq: Two times a day (BID) | ORAL | 3 refills | Status: DC | PRN
  Filled 2022-11-28 – 2022-12-01 (×2): qty 60, 30d supply, fill #0

## 2022-11-30 ENCOUNTER — Other Ambulatory Visit: Payer: Self-pay

## 2022-11-30 ENCOUNTER — Encounter (HOSPITAL_COMMUNITY): Payer: Self-pay

## 2022-11-30 ENCOUNTER — Other Ambulatory Visit (HOSPITAL_COMMUNITY): Payer: Self-pay

## 2022-12-01 ENCOUNTER — Ambulatory Visit: Payer: 59 | Admitting: Internal Medicine

## 2022-12-01 ENCOUNTER — Other Ambulatory Visit (HOSPITAL_COMMUNITY): Payer: Self-pay

## 2022-12-08 ENCOUNTER — Ambulatory Visit (INDEPENDENT_AMBULATORY_CARE_PROVIDER_SITE_OTHER): Payer: 59 | Admitting: Internal Medicine

## 2022-12-08 ENCOUNTER — Other Ambulatory Visit (HOSPITAL_COMMUNITY): Payer: Self-pay

## 2022-12-08 ENCOUNTER — Other Ambulatory Visit: Payer: Self-pay

## 2022-12-08 VITALS — BP 137/89 | HR 78 | Ht 66.0 in | Wt 152.6 lb

## 2022-12-08 DIAGNOSIS — K219 Gastro-esophageal reflux disease without esophagitis: Secondary | ICD-10-CM | POA: Diagnosis not present

## 2022-12-08 DIAGNOSIS — R7303 Prediabetes: Secondary | ICD-10-CM

## 2022-12-08 DIAGNOSIS — Z23 Encounter for immunization: Secondary | ICD-10-CM

## 2022-12-08 DIAGNOSIS — E669 Obesity, unspecified: Secondary | ICD-10-CM | POA: Diagnosis not present

## 2022-12-08 DIAGNOSIS — M797 Fibromyalgia: Secondary | ICD-10-CM

## 2022-12-08 DIAGNOSIS — I10 Essential (primary) hypertension: Secondary | ICD-10-CM

## 2022-12-08 DIAGNOSIS — R739 Hyperglycemia, unspecified: Secondary | ICD-10-CM

## 2022-12-08 DIAGNOSIS — F411 Generalized anxiety disorder: Secondary | ICD-10-CM

## 2022-12-08 DIAGNOSIS — M5135 Other intervertebral disc degeneration, thoracolumbar region: Secondary | ICD-10-CM | POA: Diagnosis not present

## 2022-12-08 DIAGNOSIS — K581 Irritable bowel syndrome with constipation: Secondary | ICD-10-CM | POA: Diagnosis not present

## 2022-12-08 MED ORDER — LANSOPRAZOLE 30 MG PO CPDR
30.0000 mg | DELAYED_RELEASE_CAPSULE | Freq: Every day | ORAL | 3 refills | Status: DC
  Filled 2022-12-08 – 2023-02-19 (×2): qty 90, 90d supply, fill #0

## 2022-12-08 NOTE — Assessment & Plan Note (Signed)
Well-controlled with MiraLAX Has tried Colace and Amitiza as well Takes senna as needed Advised to maintain adequate hydration

## 2022-12-08 NOTE — Assessment & Plan Note (Signed)
BP Readings from Last 1 Encounters:  12/08/22 137/89   Well controlled with diet modification Continue HCTZ 12.5 mg as needed for leg swelling Counseled for compliance with the medications Advised DASH diet and moderate exercise/walking, at least 150 mins/week

## 2022-12-08 NOTE — Assessment & Plan Note (Signed)
Well controlled with Prevacid 

## 2022-12-08 NOTE — Progress Notes (Signed)
Established Patient Office Visit  Subjective:  Patient ID: Theresa [name], female    DOB: 03-11-1971  Age: 52 y.o. MRN: [id number]  CC:  Chief Complaint  Patient presents with   Anxiety    Four month follow up     HPI Theresa [name] is a 52 y.o. female with past medical history of GERD, chronic rhinitis, GAD, fibromyalgia and prediabetes who presents for f/u of her chronic medical conditions.  She complains of severe fatigue, dizziness and arthralgias, which has led to missing work multiple times in the last few weeks.  She denies any fever or chills.  She has history of fibromyalgia, and was given trial of Cymbalta, but she did not tolerate it.  She has tried massage therapy with mild relief. She has chronic low back pain, denies any numbness or tingling of LE.  Denies saddle anesthesia, urinary or stool incontinence.  She has started taking Wegovy. Her nausea and vomiting have resolved now.  She has been taking Celexa and Xanax as needed for anxiety. She had to take Xanax BID on some days for insomnia and GAD as well. Her sleep has improved now.    Past Medical History:  Diagnosis Date   Allergy    Allergy to alpha-gal    Anxiety    Colon adenoma DEC 2014   ONE(7 MM) SIMPLE([facility])   Concussion with loss of consciousness 02/17/2019   Depression    Edema    GERD (gastroesophageal reflux disease)    History of abnormal cervical Pap smear 12/02/2013   History of nephrolithiasis    HPV (human papilloma virus) infection    Hyperlipidemia    IBS (irritable bowel syndrome)    constipation   Insomnia    Lymphadenopathy 07/12/2020   Obesity 01/14/2014   Positive test for Epstein-Barr virus (EBV) 08/21/2017   Pre-diabetes 09/2014   Superficial basal cell carcinoma (BCC) 12/26/2019   Right Breast- treatment Imiquimoid   Yeast infection 09/04/2013    Past Surgical History:  Procedure Laterality Date   BIOPSY N/A 08/01/2013   Procedure: BIOPSY;  Surgeon: West Bali, MD;  [facility];  Service: Endoscopy;  Laterality: N/A;  FOR MICROSCOPIC COLITIS   BIOPSY  08/09/2018   Procedure: BIOPSY;  Surgeon: Malissa Hippo, MD;  [facility]: [facility];  Service: Endoscopy;;  gastric   BREAST REDUCTION SURGERY  2009   BREAST SURGERY  2007   breast reduction   CHOLECYSTECTOMY N/A 11/04/2014   Procedure: LAPAROSCOPIC CHOLECYSTECTOMY;  Surgeon: Franky Macho Md, MD;  Location: [facility];  Service: General;  Laterality: N/A;   COLONOSCOPY  Oct 2009   Dr. Leone Payor: int/ext hemorrhoids, ?mild proctitis but path benign, likely r/t irritation from bowel prep   COLONOSCOPY N/A 08/01/2013   Procedure: COLONOSCOPY;  Surgeon: West Bali, MD;  [facility]: [facility];  Service: Endoscopy;  Laterality: N/A;  12:00   COLONOSCOPY WITH PROPOFOL N/A 08/09/2018   Procedure: COLONOSCOPY WITH PROPOFOL;  Surgeon: Malissa Hippo, MD;  [facility]: [facility];  Service: Endoscopy;  Laterality: N/A;  730   ESOPHAGOGASTRODUODENOSCOPY  Dec 2009   Dr. Leone Payor: reflux esophagitis, proximal gastric polyps, benign   ESOPHAGOGASTRODUODENOSCOPY N/A 12/18/2014   Procedure: ESOPHAGOGASTRODUODENOSCOPY (EGD);  Surgeon: Malissa Hippo, MD;  [facility];  Service: Endoscopy;  Laterality: N/A;  230   ESOPHAGOGASTRODUODENOSCOPY (EGD) WITH PROPOFOL N/A 08/09/2018   Procedure: ESOPHAGOGASTRODUODENOSCOPY (EGD) WITH PROPOFOL;  Surgeon: Malissa Hippo, MD;  [facility];  Service: Endoscopy;  Laterality: N/A;   HEMORRHOID BANDING N/A 08/01/2013   Procedure: HEMORRHOID BANDING;  Surgeon: West Bali, MD;  [facility];  Service: Endoscopy;  Laterality: N/A;  internal hemorrhoid banding   LIVER BIOPSY N/A 11/04/2014   Procedure: LIVER BIOPSY;  Surgeon: Franky Macho Md, MD;  Location: [facility];  Service: General;  Laterality: N/A;   POLYPECTOMY  2015   Dr.Fields   POLYPECTOMY  08/09/2018   Procedure: POLYPECTOMY;  Surgeon: Malissa Hippo, MD;  [facility];  Service:  Endoscopy;;  colon   REDUCTION MAMMAPLASTY Bilateral 2006   UPPER GASTROINTESTINAL ENDOSCOPY  2006   Dr. Clydene Pugh in [place]    Family History  Problem Relation Age of Onset   Cancer Mother        head and neck   Hypertension Father    Hyperlipidemia Father    Pancreatic cancer Maternal Grandfather    Colon cancer Neg Hx     Social History   Socioeconomic History   Marital status: Married    Spouse name: Not on file   Number of children: 1   Years of education: Not on file   Highest education level: Associate degree: academic program  Occupational History   Occupation: Teacher, adult education: [employer]  Tobacco Use   Smoking status: Never   Smokeless tobacco: Never   Tobacco comments:    Never smoker  Vaping Use   Vaping Use: Never used  Substance and Sexual Activity   Alcohol use: Yes    Alcohol/week: 0.0 standard drinks of alcohol    Comment: socially   Drug use: No   Sexual activity: Yes    Birth control/protection: I.U.D.  Other Topics Concern   Not on file  Social History Narrative   Not on file   Social Determinants of Health   Financial Resource Strain: Low Risk  (12/08/2022)   Overall Financial Resource Strain (CARDIA)    Difficulty of Paying Living Expenses: Not hard at all  Food Insecurity: No Food Insecurity (12/08/2022)   Hunger Vital Sign    Worried About Running Out of Food in the Last Year: Never true    Ran Out of Food in the Last Year: Never true  Transportation Needs: No Transportation Needs (12/08/2022)   PRAPARE - Administrator, Civil Service (Medical): No    Lack of Transportation (Non-Medical): No  Physical Activity: Unknown (12/08/2022)   Exercise Vital Sign    Days of Exercise per Week: 0 days    Minutes of Exercise per Session: Not on file  Stress: No Stress Concern Present (12/08/2022)   [employer] of Occupational Health - Occupational Stress Questionnaire    Feeling of Stress : Not at all  Social Connections:  Moderately Integrated (12/08/2022)   Social Connection and Isolation Panel [NHANES]    Frequency of Communication with Friends and Family: More than three times a week    Frequency of Social Gatherings with Friends and Family: Twice a week    Attends Religious Services: 1 to 4 times per year    Active Member of [employer] or Organizations: No    Attends Engineer, structural: Not on file    Marital Status: Married  Catering manager Violence: Not on file    Outpatient Medications Prior to Visit  Medication Sig Dispense Refill   Semaglutide-Weight Management (WEGOVY) 1.7 MG/0.75ML SOAJ Inject 1.7 mg into the skin every 7 (seven) days. 3 mL 3  ALPRAZolam (XANAX) 1 MG tablet Take 1 tablet (1 mg) by mouth 2 times daily as needed for anxiety. 60 tablet 3   Carbinoxamine Maleate 4 MG TABS Take 1 tablet (4 mg total) by mouth 2 (two) times daily as needed. 60 tablet 5   citalopram (CELEXA) 40 MG tablet Take 1 tablet (40 mg total) by mouth daily. 90 tablet 1   EPINEPHrine (EPIPEN 2-PAK) 0.3 mg/0.3 mL IJ SOAJ injection Inject 0.3 mg into the muscle as needed for anaphylaxis. 1 each 1   fluticasone (FLONASE) 50 MCG/ACT nasal spray Place 1 spray into both nostrils 2 (two) times daily as needed for allergies or rhinitis 16 g 11   ipratropium (ATROVENT) 0.06 % nasal spray Place 2 sprays into both nostrils 3 (three) times daily. 15 mL 0   levonorgestrel (MIRENA) 20 MCG/24HR IUD 1 each by Intrauterine route once.     predniSONE (DELTASONE) 10 MG tablet Take 1 tablet (10 mg total) by mouth daily as needed for up to 30 doses. Take one tablet one hour before allergy shots. 30 tablet 1   cycloSPORINE (RESTASIS) 0.05 % ophthalmic emulsion Place 1 drop into both eyes 2 (two) times daily. 180 mL 4   lansoprazole (PREVACID) 30 MG capsule Take 1 capsule (30 mg total) by mouth daily at 12 noon. 90 capsule 0   No facility-administered medications prior to visit.    Allergies  Allergen Reactions   Alpha-Gal  Anaphylaxis, Nausea And Vomiting and Shortness Of Breath   Heparin (Porcine) Other (See Comments)    ROS Review of Systems  Constitutional:  Positive for fatigue. Negative for chills and fever.  HENT:  Negative for congestion, sinus pressure, sinus pain and sore throat.   Eyes:  Negative for pain and discharge.  Respiratory:  Negative for cough and shortness of breath.   Cardiovascular:  Negative for chest pain and palpitations.  Gastrointestinal:  Positive for constipation. Negative for abdominal pain, nausea and vomiting.  Endocrine: Negative for polydipsia and polyuria.  Genitourinary:  Negative for dysuria and hematuria.  Musculoskeletal:  Positive for arthralgias, back pain, myalgias and neck pain. Negative for neck stiffness.  Skin:  Negative for rash.  Allergic/Immunologic: Positive for environmental allergies.  Neurological:  Negative for dizziness and weakness.  Psychiatric/Behavioral:  Negative for agitation and behavioral problems. The patient is nervous/anxious.       Objective:    Physical Exam Vitals reviewed.  Constitutional:      General: She is not in acute distress.    Appearance: She is not diaphoretic.  HENT:     Head: Normocephalic and atraumatic.     Nose: Nose normal.     Mouth/Throat:     Mouth: Mucous membranes are moist.  Eyes:     General: No scleral icterus.    Extraocular Movements: Extraocular movements intact.  Cardiovascular:     Rate and Rhythm: Normal rate and regular rhythm.     Pulses: Normal pulses.     Heart sounds: Normal heart sounds. No murmur heard. Pulmonary:     Breath sounds: Normal breath sounds. No wheezing or rales.  Musculoskeletal:     Right shoulder: Tenderness present.     Left shoulder: Tenderness present.     Cervical back: Neck supple. No tenderness.     Lumbar back: Tenderness present. Decreased range of motion.     Right lower leg: No edema.     Left lower leg: No edema.  Skin:    General: Skin is warm.  Findings: No rash.  Neurological:     General: No focal deficit present.     Mental Status: She is alert and oriented to person, place, and time.     Sensory: No sensory deficit.     Motor: No weakness.  Psychiatric:        Mood and Affect: Mood normal.        Behavior: Behavior normal.     BP 137/89 (BP Location: Right Arm, Patient Position: Sitting, Cuff Size: Normal)   Pulse 78   Ht 5\' 6"  (1.676 m)   Wt 152 lb 9.6 oz (69.2 kg)   SpO2 99%   BMI 24.63 kg/m  Wt Readings from Last 3 Encounters:  12/08/22 152 lb 9.6 oz (69.2 kg)  07/27/22 153 lb 3.2 oz (69.5 kg)  07/19/22 155 lb 9.6 oz (70.6 kg)    Lab Results  Component Value Date   TSH 1.680 07/27/2022   Lab Results  Component Value Date   WBC 4.1 07/27/2022   HGB 14.8 07/27/2022   HCT 43.4 07/27/2022   MCV 89 07/27/2022   PLT 271 07/27/2022   Lab Results  Component Value Date   NA 140 07/27/2022   K 3.9 07/27/2022   CO2 24 07/27/2022   GLUCOSE 80 07/27/2022   BUN 9 07/27/2022   CREATININE 0.79 07/27/2022   BILITOT 0.5 07/27/2022   ALKPHOS 76 07/27/2022   AST 14 07/27/2022   ALT 16 07/27/2022   PROT 6.8 07/27/2022   ALBUMIN 4.3 07/27/2022   CALCIUM 9.2 07/27/2022   ANIONGAP 8 06/01/2022   EGFR 91 07/27/2022   Lab Results  Component Value Date   CHOL 161 07/27/2022   Lab Results  Component Value Date   HDL 35 (L) 07/27/2022   Lab Results  Component Value Date   LDLCALC 104 (H) 07/27/2022   Lab Results  Component Value Date   TRIG 120 07/27/2022   Lab Results  Component Value Date   CHOLHDL 4.6 (H) 07/27/2022   Lab Results  Component Value Date   HGBA1C 5.4 07/27/2022      Assessment & Plan:   Problem List Items Addressed This Visit       Cardiovascular and Mediastinum   Essential hypertension - Primary    BP Readings from Last 1 Encounters:  12/08/22 137/89  Well controlled with diet modification Continue HCTZ 12.5 mg as needed for leg swelling Counseled for compliance with  the medications Advised DASH diet and moderate exercise/walking, at least 150 mins/week      Relevant Orders   CMP14+EGFR     Digestive   Irritable bowel syndrome with constipation    Well-controlled with MiraLAX Has tried Colace and Amitiza as well Takes senna as needed Advised to maintain adequate hydration      Relevant Medications   lansoprazole (PREVACID) 30 MG capsule   Gastroesophageal reflux disease    Well controlled with Prevacid      Relevant Medications   lansoprazole (PREVACID) 30 MG capsule     Musculoskeletal and Integument   DDD (degenerative disc disease), thoracolumbar    Chronic low back pain History of sclerosis/DDD of thoracolumbar spine Has tried simple back exercises Undergoing massage therapy with chiropractor NexWave device prescribed        Other   Generalized anxiety disorder (Chronic)    Well-controlled with Xanax 1 mg BID PRN and Celexa Had switched to Cymbalta for benefit with fibromyalgia, but did not tolerate it well  Fibromyalgia (Chronic)    Had Rheumatology evaluation in 2019, was told of fibromyalgia, but did not start any treatment at that time Had started Cymbalta, but did not tolerate it If persistent symptoms or insomnia, may switch to Elavil - she prefers to stay with Celexa Was referred to Rheumatology, but they do not manage fibromyalgia - her Ob.Gyn. had tried to send referral again      Relevant Orders   CMP14+EGFR   Obesity    BMI Readings from Last 3 Encounters:  12/08/22 24.63 kg/m  07/27/22 24.73 kg/m  07/19/22 25.11 kg/m  Advised to follow low-carb diet and perform moderate exercise at least 150 minutes/week Has prediabetes, HLD, HTN and GERD as well She is a good candidate for the GLP-1 agonist - on Wegovy - initial BMI - 34.31 She is going to lose coverage of Wegovy, needs to continue low carb diet      Relevant Orders   CMP14+EGFR   Hemoglobin A1c   Prediabetes    Lab Results  Component Value  Date   HGBA1C 5.4 07/27/2022  Continue to follow low carb diet      Other Visit Diagnoses     Hyperglycemia       Relevant Orders   Hemoglobin A1c       Meds ordered this encounter  Medications   lansoprazole (PREVACID) 30 MG capsule    Sig: Take 1 capsule (30 mg total) by mouth daily at 12 noon.    Dispense:  90 capsule    Refill:  3    Follow-up: Return in about 4 months (around 04/09/2023) for GAD.    Anabel Halon, MD

## 2022-12-08 NOTE — Assessment & Plan Note (Signed)
BMI Readings from Last 3 Encounters:  12/08/22 24.63 kg/m  07/27/22 24.73 kg/m  07/19/22 25.11 kg/m   Advised to follow low-carb diet and perform moderate exercise at least 150 minutes/week Has prediabetes, HLD, HTN and GERD as well She is a good candidate for the GLP-1 agonist - on Wegovy - initial BMI - 34.31 She is going to lose coverage of Wegovy, needs to continue low carb diet

## 2022-12-08 NOTE — Assessment & Plan Note (Addendum)
Had Rheumatology evaluation in 2019, was told of fibromyalgia, but did not start any treatment at that time Had started Cymbalta, but did not tolerate it If persistent symptoms or insomnia, may switch to Elavil - she prefers to stay with Celexa Was referred to Rheumatology, but they do not manage fibromyalgia - her Ob.Gyn. had tried to send referral again

## 2022-12-08 NOTE — Assessment & Plan Note (Signed)
Chronic low back pain History of sclerosis/DDD of thoracolumbar spine Has tried simple back exercises Undergoing massage therapy with chiropractor NexWave device prescribed

## 2022-12-08 NOTE — Assessment & Plan Note (Addendum)
Well-controlled with Xanax 1 mg BID PRN and Celexa Had switched to Cymbalta for benefit with fibromyalgia, but did not tolerate it well

## 2022-12-08 NOTE — Assessment & Plan Note (Signed)
Lab Results  Component Value Date   HGBA1C 5.4 07/27/2022   Continue to follow low carb diet

## 2022-12-08 NOTE — Patient Instructions (Addendum)
Please continue to take medications as prescribed.  Please continue to follow low carb diet and perform moderate exercise/walking at least 150 mins/week.  Please get blood tests done before the next visit.

## 2022-12-31 ENCOUNTER — Encounter: Payer: Self-pay | Admitting: Internal Medicine

## 2022-12-31 DIAGNOSIS — F411 Generalized anxiety disorder: Secondary | ICD-10-CM

## 2023-01-01 ENCOUNTER — Other Ambulatory Visit: Payer: Self-pay | Admitting: Internal Medicine

## 2023-01-01 ENCOUNTER — Other Ambulatory Visit (HOSPITAL_COMMUNITY): Payer: Self-pay

## 2023-01-01 DIAGNOSIS — F411 Generalized anxiety disorder: Secondary | ICD-10-CM

## 2023-01-01 MED ORDER — ALPRAZOLAM 1 MG PO TABS
1.0000 mg | ORAL_TABLET | Freq: Two times a day (BID) | ORAL | 0 refills | Status: DC | PRN
  Filled 2023-01-01: qty 60, 30d supply, fill #0

## 2023-01-01 MED ORDER — ALPRAZOLAM 1 MG PO TABS
1.0000 mg | ORAL_TABLET | Freq: Two times a day (BID) | ORAL | 0 refills | Status: DC | PRN
Start: 2023-01-01 — End: 2023-01-30

## 2023-01-01 MED ORDER — ALPRAZOLAM 1 MG PO TABS
1.0000 mg | ORAL_TABLET | Freq: Two times a day (BID) | ORAL | 3 refills | Status: DC | PRN
Start: 2023-01-01 — End: 2023-01-01

## 2023-01-30 ENCOUNTER — Other Ambulatory Visit: Payer: Self-pay | Admitting: Internal Medicine

## 2023-01-30 DIAGNOSIS — F411 Generalized anxiety disorder: Secondary | ICD-10-CM

## 2023-01-30 MED ORDER — CITALOPRAM HYDROBROMIDE 40 MG PO TABS
40.0000 mg | ORAL_TABLET | Freq: Every day | ORAL | 1 refills | Status: DC
Start: 2023-01-30 — End: 2023-08-06

## 2023-01-30 MED ORDER — ALPRAZOLAM 1 MG PO TABS
1.0000 mg | ORAL_TABLET | Freq: Two times a day (BID) | ORAL | 3 refills | Status: DC | PRN
Start: 2023-01-30 — End: 2023-06-14

## 2023-02-19 ENCOUNTER — Other Ambulatory Visit: Payer: Self-pay

## 2023-02-19 ENCOUNTER — Encounter: Payer: Self-pay | Admitting: Internal Medicine

## 2023-02-23 DIAGNOSIS — R5382 Chronic fatigue, unspecified: Secondary | ICD-10-CM | POA: Diagnosis not present

## 2023-02-23 DIAGNOSIS — N926 Irregular menstruation, unspecified: Secondary | ICD-10-CM | POA: Diagnosis not present

## 2023-02-23 DIAGNOSIS — E663 Overweight: Secondary | ICD-10-CM | POA: Diagnosis not present

## 2023-02-23 DIAGNOSIS — L658 Other specified nonscarring hair loss: Secondary | ICD-10-CM | POA: Diagnosis not present

## 2023-02-23 DIAGNOSIS — E6609 Other obesity due to excess calories: Secondary | ICD-10-CM | POA: Diagnosis not present

## 2023-03-02 ENCOUNTER — Encounter: Payer: Self-pay | Admitting: Gastroenterology

## 2023-03-02 ENCOUNTER — Ambulatory Visit (INDEPENDENT_AMBULATORY_CARE_PROVIDER_SITE_OTHER): Payer: 59 | Admitting: Gastroenterology

## 2023-03-02 VITALS — BP 128/83 | HR 73 | Temp 97.9°F | Ht 66.0 in | Wt 153.6 lb

## 2023-03-02 DIAGNOSIS — R109 Unspecified abdominal pain: Secondary | ICD-10-CM

## 2023-03-02 DIAGNOSIS — Z86010 Personal history of colonic polyps: Secondary | ICD-10-CM

## 2023-03-02 DIAGNOSIS — K219 Gastro-esophageal reflux disease without esophagitis: Secondary | ICD-10-CM | POA: Diagnosis not present

## 2023-03-02 DIAGNOSIS — R1310 Dysphagia, unspecified: Secondary | ICD-10-CM | POA: Insufficient documentation

## 2023-03-02 DIAGNOSIS — K581 Irritable bowel syndrome with constipation: Secondary | ICD-10-CM | POA: Diagnosis not present

## 2023-03-02 DIAGNOSIS — R1319 Other dysphagia: Secondary | ICD-10-CM

## 2023-03-02 DIAGNOSIS — K644 Residual hemorrhoidal skin tags: Secondary | ICD-10-CM | POA: Diagnosis not present

## 2023-03-02 NOTE — H&P (View-Only) (Signed)
GI Office Note    Referring Provider: Anabel Halon, MD Primary Care Physician:  Anabel Halon, MD  Primary Gastroenterologist: previously Dr. Karilyn Cota  Chief Complaint   Chief Complaint  Patient presents with   rectal issues     History of Present Illness   Theresa [name] is a 52 y.o. female presenting today for further evaluation of rectal issues. She has history of chronic GERD, IBS-C, h/o colon polyps, external hemorrhoids.    Chronic constipation since teens. Has failed Linzess, Amitiza, Trulance. Miralax is the only thing that works. Takes it daily. Sometimes has had to disimpact herself but has been awhile. Two years ago she was diagnosed with rectocele by her GYN. Over the past two months she has noted that her external hemorrhoids are infringing on her vaginal opening. She has a spot on the right side that is tender at times. Occasional toilet tissue brbpr. She has trouble with hygiene due to the hemorrhoids. Notes stool seepage when she goes to urinate. Completed hemorrhoid banding in 2014 for internal hemorrhoids. Her heartburn is well controlled. She has had issues with solid food dysphagia and pill dysphagia. She desires upper endoscopy. She has had intermittent right mid abdominal pain for over a year. Can happen several times a week. At times severe, similar to kidney stone pain. No n/v. Pain can last an hour at a time. Has woken her up from sleep before. She had a CT A/P with contrast in 09/2021 to evaluate this pain. She had nonobstructive bilateral renal stones and moderate stool throughout the colon. Appendix appeared normal. Liver unremarkable.   Lab 02/2023: T bili .04, AP 67, AST 16, Cre 0.77, WBC 6700, Hgb 14.3, Platelets 292000  Liver biopsy in 2016 at time of cholecystectomy with focal hepatic steatosis.   Patient has intentional weight loss of 60+ pounds.    Colonosocpy 07/2018: -perianal skin tags found on perianal exam -one 5mm polyp at splenic flexure,  sessile serrated polyp -scar in distal rectum -external hemorrhoids -surveillance colonoscopy in 07/2023  EGD 07/2018: -normal esophagus -multiple gastric polyps, fundic gland Erythematous mucosa in gastric body (possibly due to heaving)  Medications   Current Outpatient Medications  Medication Sig Dispense Refill   ALPRAZolam (XANAX) 1 MG tablet Take 1 tablet (1 mg) by mouth 2 times daily as needed for anxiety. 60 tablet 3   citalopram (CELEXA) 40 MG tablet Take 1 tablet (40 mg total) by mouth daily. 90 tablet 1   EPINEPHrine (EPIPEN 2-PAK) 0.3 mg/0.3 mL IJ SOAJ injection Inject 0.3 mg into the muscle as needed for anaphylaxis. 1 each 1   fluticasone (FLONASE) 50 MCG/ACT nasal spray Place 1 spray into both nostrils 2 (two) times daily as needed for allergies or rhinitis 16 g 11   ipratropium (ATROVENT) 0.06 % nasal spray Place 2 sprays into both nostrils 3 (three) times daily. 15 mL 0   lansoprazole (PREVACID) 30 MG capsule Take 1 capsule (30 mg total) by mouth daily at 12 noon. 90 capsule 3   levonorgestrel (MIRENA) 20 MCG/24HR IUD 1 each by Intrauterine route once.     polyethylene glycol (MIRALAX / GLYCOLAX) 17 g packet Take 17 g by mouth daily.     Semaglutide-Weight Management 2.4 MG/0.75ML SOAJ Inject 2.4 mg into the skin every 14 (fourteen) days.     No current facility-administered medications for this visit.    Allergies   Allergies as of 03/02/2023 - Review Complete 12/08/2022  Allergen Reaction Noted  Alpha-gal Anaphylaxis, Nausea And Vomiting, and Shortness Of Breath 07/19/2022   Heparin (porcine) Other (See Comments) 01/27/2019    Past Medical History   Past Medical History:  Diagnosis Date   Allergy    Allergy to alpha-gal    Anxiety    Colon adenoma DEC 2014   ONE(7 MM) SIMPLE([facility])   Concussion with loss of consciousness 02/17/2019   Depression    Edema    GERD (gastroesophageal reflux disease)    History of abnormal cervical Pap smear 12/02/2013    History of nephrolithiasis    HPV (human papilloma virus) infection    Hyperlipidemia    IBS (irritable bowel syndrome)    constipation   Insomnia    Lymphadenopathy 07/12/2020   Obesity 01/14/2014   Positive test for Epstein-Barr virus (EBV) 08/21/2017   Pre-diabetes 09/2014   Superficial basal cell carcinoma (BCC) 12/26/2019   Right Breast- treatment Imiquimoid   Yeast infection 09/04/2013    Past Surgical History   Past Surgical History:  Procedure Laterality Date   BIOPSY N/A 08/01/2013   Procedure: BIOPSY;  Surgeon: West Bali, MD;  [facility];  Service: Endoscopy;  Laterality: N/A;  FOR MICROSCOPIC COLITIS   BIOPSY  08/09/2018   Procedure: BIOPSY;  Surgeon: Malissa Hippo, MD;  [facility];  Service: Endoscopy;;  gastric   BREAST REDUCTION SURGERY  2009   BREAST SURGERY  2007   breast reduction   CHOLECYSTECTOMY N/A 11/04/2014   Procedure: LAPAROSCOPIC CHOLECYSTECTOMY;  Surgeon: Franky Macho Md, MD;  Location: [facility];  Service: General;  Laterality: N/A;   COLONOSCOPY  Oct 2009   Dr. Leone Payor: int/ext hemorrhoids, ?mild proctitis but path benign, likely r/t irritation from bowel prep   COLONOSCOPY N/A 08/01/2013   Procedure: COLONOSCOPY;  Surgeon: West Bali, MD;  [facility];  Service: Endoscopy;  Laterality: N/A;  12:00   COLONOSCOPY WITH PROPOFOL N/A 08/09/2018   Procedure: COLONOSCOPY WITH PROPOFOL;  Surgeon: Malissa Hippo, MD;  [facility];  Service: Endoscopy;  Laterality: N/A;  730   ESOPHAGOGASTRODUODENOSCOPY  Dec 2009   Dr. Leone Payor: reflux esophagitis, proximal gastric polyps, benign   ESOPHAGOGASTRODUODENOSCOPY N/A 12/18/2014   Procedure: ESOPHAGOGASTRODUODENOSCOPY (EGD);  Surgeon: Malissa Hippo, MD;  [facility];  Service: Endoscopy;  Laterality: N/A;  230   ESOPHAGOGASTRODUODENOSCOPY (EGD) WITH PROPOFOL N/A 08/09/2018   Procedure: ESOPHAGOGASTRODUODENOSCOPY (EGD) WITH PROPOFOL;  Surgeon:  Malissa Hippo, MD;  [facility];  Service: Endoscopy;  Laterality: N/A;   HEMORRHOID BANDING N/A 08/01/2013   Procedure: HEMORRHOID BANDING;  Surgeon: West Bali, MD;  [facility];  Service: Endoscopy;  Laterality: N/A;  internal hemorrhoid banding   LIVER BIOPSY N/A 11/04/2014   Procedure: LIVER BIOPSY;  Surgeon: Franky Macho Md, MD;  Location: [facility];  Service: General;  Laterality: N/A;   POLYPECTOMY  2015   Dr.Fields   POLYPECTOMY  08/09/2018   Procedure: POLYPECTOMY;  Surgeon: Malissa Hippo, MD;  [facility];  Service: Endoscopy;;  colon   REDUCTION MAMMAPLASTY Bilateral 2006   UPPER GASTROINTESTINAL ENDOSCOPY  2006   Dr. Clydene Pugh in [place]    Past Family History   Family History  Problem Relation Age of Onset   Cancer Mother        head and neck   Hypertension Father    Hyperlipidemia Father    Pancreatic cancer Maternal Grandfather    Colon cancer Neg Hx  Past Social History   Social History   Socioeconomic History   Marital status: Married    Spouse name: Not on file   Number of children: 1   Years of education: Not on file   Highest education level: Associate degree: academic program  Occupational History   Occupation: Teacher, adult education: [employer]  Tobacco Use   Smoking status: Never   Smokeless tobacco: Never   Tobacco comments:    Never smoker  Vaping Use   Vaping status: Never Used  Substance and Sexual Activity   Alcohol use: Yes    Alcohol/week: 0.0 standard drinks of alcohol    Comment: socially   Drug use: No   Sexual activity: Yes    Birth control/protection: I.U.D.  Other Topics Concern   Not on file  Social History Narrative   Not on file   Social Determinants of Health   Financial Resource Strain: Low Risk  (12/08/2022)   Overall Financial Resource Strain (CARDIA)    Difficulty of Paying Living Expenses: Not hard at all  Food Insecurity: No Food Insecurity (12/08/2022)   Hunger Vital  Sign    Worried About Running Out of Food in the Last Year: Never true    Ran Out of Food in the Last Year: Never true  Transportation Needs: No Transportation Needs (12/08/2022)   PRAPARE - Administrator, Civil Service (Medical): No    Lack of Transportation (Non-Medical): No  Physical Activity: Unknown (12/08/2022)   Exercise Vital Sign    Days of Exercise per Week: 0 days    Minutes of Exercise per Session: Not on file  Stress: No Stress Concern Present (12/08/2022)   [employer] of Occupational Health - Occupational Stress Questionnaire    Feeling of Stress : Not at all  Social Connections: Moderately Integrated (12/08/2022)   Social Connection and Isolation Panel [NHANES]    Frequency of Communication with Friends and Family: More than three times a week    Frequency of Social Gatherings with Friends and Family: Twice a week    Attends Religious Services: 1 to 4 times per year    Active Member of [employer] or Organizations: No    Attends Engineer, structural: Not on file    Marital Status: Married  Catering manager Violence: Not on file    Review of Systems   General: Negative for anorexia, unintentional weight loss, fever, chills, fatigue, weakness. Eyes: Negative for vision changes.  ENT: Negative for hoarseness,nasal congestion. See hpi CV: Negative for chest pain, angina, palpitations, dyspnea on exertion, peripheral edema.  Respiratory: Negative for dyspnea at rest, dyspnea on exertion, cough, sputum, wheezing.  GI: See history of present illness. GU:  Negative for dysuria, hematuria, urinary incontinence, urinary frequency, nocturnal urination.  MS: Negative for joint pain, low back pain.  Derm: Negative for rash or itching.  Neuro: Negative for weakness, abnormal sensation, seizure, frequent headaches, memory loss,  confusion.  Psych: Negative for suicidal ideation, hallucinations.  Endo: Negative for unusual weight change.  Heme: Negative for  bruising or bleeding. Allergy: Negative for rash or hives.  Physical Exam   BP 128/83   Pulse 73   Temp 97.9 F (36.6 C)   Ht 5\' 6"  (1.676 m)   Wt 153 lb 9.6 oz (69.7 kg)   BMI 24.79 kg/m    General: Well-nourished, well-developed in no acute distress.  Head: Normocephalic, atraumatic.   Eyes: Conjunctiva pink, no icterus. Mouth: Oropharyngeal mucosa moist and  pink   Neck: Supple without thyromegaly, masses, or lymphadenopathy.  Lungs: Clear to auscultation bilaterally.  Heart: Regular rate and rhythm, no murmurs rubs or gallops.  Abdomen: Bowel sounds are normal, nondistended, no hepatosplenomegaly or masses,  no abdominal bruits or hernia, no rebound or guarding.  Mild right mid abd tenderness Rectal: large external hemorrhoids. Non-thrombosed or bleeding. One on the right side soft but more engorged, slightly tender to touch, no breakdown of mucosa but with several tiny white flat "dots". Possible internal hemorrhoids palpated as well. No obvious mass. No gross blood per rectum.  Extremities: No lower extremity edema. No clubbing or deformities.  Neuro: Alert and oriented x 4 , grossly normal neurologically.  Skin: Warm and dry, no rash or jaundice.   Psych: Alert and cooperative, normal mood and affect.  Labs   Lab Results  Component Value Date   NA 140 07/27/2022   CL 103 07/27/2022   K 3.9 07/27/2022   CO2 24 07/27/2022   BUN 9 07/27/2022   CREATININE 0.79 07/27/2022   EGFR 91 07/27/2022   CALCIUM 9.2 07/27/2022   ALBUMIN 4.3 07/27/2022   GLUCOSE 80 07/27/2022   Lab Results  Component Value Date   ALT 16 07/27/2022   AST 14 07/27/2022   ALKPHOS 76 07/27/2022   BILITOT 0.5 07/27/2022   Lab Results  Component Value Date   WBC 4.1 07/27/2022   HGB 14.8 07/27/2022   HCT 43.4 07/27/2022   MCV 89 07/27/2022   PLT 271 07/27/2022   Lab Results  Component Value Date   TSH 1.680 07/27/2022   Lab Results  Component Value Date   HGBA1C 5.4 07/27/2022   Lab  Results  Component Value Date   FOLATE 14.6 07/19/2022   Lab Results  Component Value Date   IRON 55 07/19/2022   TIBC 311 07/19/2022   Lab Results  Component Value Date   VITAMINB12 557 07/19/2022    Imaging Studies   No results found.  Assessment   *External hemorrhoids *IBS-C *Chronic GERD *Esophageal dysphagia *H/O adenomatous colon polyps *Right sided abdominal pain *Focal hepatic steatosis on liver biopsy  Baseline constipation overall well controlled with Miralax. Occasional need for disimpaction, but not recently. Diagnosed with rectocele which is likely contributing to her issues. She has large external hemorrhoids, one on the right more engorged but no thrombosis, overlying skin somewhat unusual as described above. Ultimately she will need external hemorrhoidectomy but would advise colonoscopy first. She is due this year for history of adenomatous colon polyps.   Chronic GERD with control of heartburn. She has some intermittent solid food dysphagia. Requesting EGD with possible dilation if appropriate.   Chronic intermittent right sided abdominal pain. CT 09/2021 with moderate stool load but liver, appendix, TI normal. Await EGD/colonoscopy findings.   History of focal steatosis on liver biopsy done at time of gb surgery. Her LFTs are normal. She has intentional weight loss of 60+ pounds. Would encourage ongoing healthy lifestyle with regular exercise.    PLAN   Colonoscopy/EGD+/-ED. Patient requesting procedure with first available (Dr. Lanier Prude. Marletta Lor). ASA 2.  I have discussed the risks, alternatives, benefits with regards to but not limited to the risk of reaction to medication, bleeding, infection, perforation and the patient is agreeable to proceed. Written consent to be obtained. Continue lansoprazole daily. Continue miralax daily. She will need hemorrhoidectomy after her colonoscopy has been updated.    Leanna Battles. Melvyn Neth, MHS, PA-C [facility]  I have reviewed the  note and agree with the APP's assessment as described in this progress note  May uptitrate Miralax to 2-3 capfuls per day.  Katrinka Blazing, MD Gastroenterology and Hepatology [facility]

## 2023-03-02 NOTE — Progress Notes (Addendum)
GI Office Note    Referring Provider: Anabel Halon, MD Primary Care Physician:  Anabel Halon, MD  Primary Gastroenterologist: previously Dr. Karilyn Cota  Chief Complaint   Chief Complaint  Patient presents with   rectal issues     History of Present Illness   Theresa [name] is a 52 y.o. female presenting today for further evaluation of rectal issues. She has history of chronic GERD, IBS-C, h/o colon polyps, external hemorrhoids.    Chronic constipation since teens. Has failed Linzess, Amitiza, Trulance. Miralax is the only thing that works. Takes it daily. Sometimes has had to disimpact herself but has been awhile. Two years ago she was diagnosed with rectocele by her GYN. Over the past two months she has noted that her external hemorrhoids are infringing on her vaginal opening. She has a spot on the right side that is tender at times. Occasional toilet tissue brbpr. She has trouble with hygiene due to the hemorrhoids. Notes stool seepage when she goes to urinate. Completed hemorrhoid banding in 2014 for internal hemorrhoids. Her heartburn is well controlled. She has had issues with solid food dysphagia and pill dysphagia. She desires upper endoscopy. She has had intermittent right mid abdominal pain for over a year. Can happen several times a week. At times severe, similar to kidney stone pain. No n/v. Pain can last an hour at a time. Has woken her up from sleep before. She had a CT A/P with contrast in 09/2021 to evaluate this pain. She had nonobstructive bilateral renal stones and moderate stool throughout the colon. Appendix appeared normal. Liver unremarkable.   Lab 02/2023: T bili .04, AP 67, AST 16, Cre 0.77, WBC 6700, Hgb 14.3, Platelets 292000  Liver biopsy in 2016 at time of cholecystectomy with focal hepatic steatosis.   Patient has intentional weight loss of 60+ pounds.    Colonosocpy 07/2018: -perianal skin tags found on perianal exam -one 5mm polyp at splenic flexure,  sessile serrated polyp -scar in distal rectum -external hemorrhoids -surveillance colonoscopy in 07/2023  EGD 07/2018: -normal esophagus -multiple gastric polyps, fundic gland Erythematous mucosa in gastric body (possibly due to heaving)  Medications   Current Outpatient Medications  Medication Sig Dispense Refill   ALPRAZolam (XANAX) 1 MG tablet Take 1 tablet (1 mg) by mouth 2 times daily as needed for anxiety. 60 tablet 3   citalopram (CELEXA) 40 MG tablet Take 1 tablet (40 mg total) by mouth daily. 90 tablet 1   EPINEPHrine (EPIPEN 2-PAK) 0.3 mg/0.3 mL IJ SOAJ injection Inject 0.3 mg into the muscle as needed for anaphylaxis. 1 each 1   fluticasone (FLONASE) 50 MCG/ACT nasal spray Place 1 spray into both nostrils 2 (two) times daily as needed for allergies or rhinitis 16 g 11   ipratropium (ATROVENT) 0.06 % nasal spray Place 2 sprays into both nostrils 3 (three) times daily. 15 mL 0   lansoprazole (PREVACID) 30 MG capsule Take 1 capsule (30 mg total) by mouth daily at 12 noon. 90 capsule 3   levonorgestrel (MIRENA) 20 MCG/24HR IUD 1 each by Intrauterine route once.     polyethylene glycol (MIRALAX / GLYCOLAX) 17 g packet Take 17 g by mouth daily.     Semaglutide-Weight Management 2.4 MG/0.75ML SOAJ Inject 2.4 mg into the skin every 14 (fourteen) days.     No current facility-administered medications for this visit.    Allergies   Allergies as of 03/02/2023 - Review Complete 12/08/2022  Allergen Reaction Noted  Alpha-gal Anaphylaxis, Nausea And Vomiting, and Shortness Of Breath 07/19/2022   Heparin (porcine) Other (See Comments) 01/27/2019    Past Medical History   Past Medical History:  Diagnosis Date   Allergy    Allergy to alpha-gal    Anxiety    Colon adenoma DEC 2014   ONE(7 MM) SIMPLE([place])   Concussion with loss of consciousness 02/17/2019   Depression    Edema    GERD (gastroesophageal reflux disease)    History of abnormal cervical Pap smear 12/02/2013    History of nephrolithiasis    HPV (human papilloma virus) infection    Hyperlipidemia    IBS (irritable bowel syndrome)    constipation   Insomnia    Lymphadenopathy 07/12/2020   Obesity 01/14/2014   Positive test for Epstein-Barr virus (EBV) 08/21/2017   Pre-diabetes 09/2014   Superficial basal cell carcinoma (BCC) 12/26/2019   Right Breast- treatment Imiquimoid   Yeast infection 09/04/2013    Past Surgical History   Past Surgical History:  Procedure Laterality Date   BIOPSY N/A 08/01/2013   Procedure: BIOPSY;  Surgeon: West Bali, MD;  [facility];  Service: Endoscopy;  Laterality: N/A;  FOR MICROSCOPIC COLITIS   BIOPSY  08/09/2018   Procedure: BIOPSY;  Surgeon: Malissa Hippo, MD;  [facility];  Service: Endoscopy;;  gastric   BREAST REDUCTION SURGERY  2009   BREAST SURGERY  2007   breast reduction   CHOLECYSTECTOMY N/A 11/04/2014   Procedure: LAPAROSCOPIC CHOLECYSTECTOMY;  Surgeon: Franky Macho Md, MD;  Location: [facility];  Service: General;  Laterality: N/A;   COLONOSCOPY  Oct 2009   Dr. Leone Payor: int/ext hemorrhoids, ?mild proctitis but path benign, likely r/t irritation from bowel prep   COLONOSCOPY N/A 08/01/2013   Procedure: COLONOSCOPY;  Surgeon: West Bali, MD;  [facility];  Service: Endoscopy;  Laterality: N/A;  12:00   COLONOSCOPY WITH PROPOFOL N/A 08/09/2018   Procedure: COLONOSCOPY WITH PROPOFOL;  Surgeon: Malissa Hippo, MD;  [facility];  Service: Endoscopy;  Laterality: N/A;  730   ESOPHAGOGASTRODUODENOSCOPY  Dec 2009   Dr. Leone Payor: reflux esophagitis, proximal gastric polyps, benign   ESOPHAGOGASTRODUODENOSCOPY N/A 12/18/2014   Procedure: ESOPHAGOGASTRODUODENOSCOPY (EGD);  Surgeon: Malissa Hippo, MD;  [facility];  Service: Endoscopy;  Laterality: N/A;  230   ESOPHAGOGASTRODUODENOSCOPY (EGD) WITH PROPOFOL N/A 08/09/2018   Procedure: ESOPHAGOGASTRODUODENOSCOPY (EGD) WITH PROPOFOL;  Surgeon:  Malissa Hippo, MD;  [facility];  Service: Endoscopy;  Laterality: N/A;   HEMORRHOID BANDING N/A 08/01/2013   Procedure: HEMORRHOID BANDING;  Surgeon: West Bali, MD;  [facility];  Service: Endoscopy;  Laterality: N/A;  internal hemorrhoid banding   LIVER BIOPSY N/A 11/04/2014   Procedure: LIVER BIOPSY;  Surgeon: Franky Macho Md, MD;  Location: [facility];  Service: General;  Laterality: N/A;   POLYPECTOMY  2015   Dr.Fields   POLYPECTOMY  08/09/2018   Procedure: POLYPECTOMY;  Surgeon: Malissa Hippo, MD;  [facility];  Service: Endoscopy;;  colon   REDUCTION MAMMAPLASTY Bilateral 2006   UPPER GASTROINTESTINAL ENDOSCOPY  2006   Dr. Clydene Pugh in [place]    Past Family History   Family History  Problem Relation Age of Onset   Cancer Mother        head and neck   Hypertension Father    Hyperlipidemia Father    Pancreatic cancer Maternal Grandfather    Colon cancer Neg Hx  Past Social History   Social History   Socioeconomic History   Marital status: Married    Spouse name: Not on file   Number of children: 1   Years of education: Not on file   Highest education level: Associate degree: academic program  Occupational History   Occupation: Teacher, adult education: [employer]  Tobacco Use   Smoking status: Never   Smokeless tobacco: Never   Tobacco comments:    Never smoker  Vaping Use   Vaping status: Never Used  Substance and Sexual Activity   Alcohol use: Yes    Alcohol/week: 0.0 standard drinks of alcohol    Comment: socially   Drug use: No   Sexual activity: Yes    Birth control/protection: I.U.D.  Other Topics Concern   Not on file  Social History Narrative   Not on file   Social Determinants of Health   Financial Resource Strain: Low Risk  (12/08/2022)   Overall Financial Resource Strain (CARDIA)    Difficulty of Paying Living Expenses: Not hard at all  Food Insecurity: No Food Insecurity (12/08/2022)   Hunger Vital  Sign    Worried About Running Out of Food in the Last Year: Never true    Ran Out of Food in the Last Year: Never true  Transportation Needs: No Transportation Needs (12/08/2022)   PRAPARE - Administrator, Civil Service (Medical): No    Lack of Transportation (Non-Medical): No  Physical Activity: Unknown (12/08/2022)   Exercise Vital Sign    Days of Exercise per Week: 0 days    Minutes of Exercise per Session: Not on file  Stress: No Stress Concern Present (12/08/2022)   [employer] of Occupational Health - Occupational Stress Questionnaire    Feeling of Stress : Not at all  Social Connections: Moderately Integrated (12/08/2022)   Social Connection and Isolation Panel [NHANES]    Frequency of Communication with Friends and Family: More than three times a week    Frequency of Social Gatherings with Friends and Family: Twice a week    Attends Religious Services: 1 to 4 times per year    Active Member of [employer] or Organizations: No    Attends Engineer, structural: Not on file    Marital Status: Married  Catering manager Violence: Not on file    Review of Systems   General: Negative for anorexia, unintentional weight loss, fever, chills, fatigue, weakness. Eyes: Negative for vision changes.  ENT: Negative for hoarseness,nasal congestion. See hpi CV: Negative for chest pain, angina, palpitations, dyspnea on exertion, peripheral edema.  Respiratory: Negative for dyspnea at rest, dyspnea on exertion, cough, sputum, wheezing.  GI: See history of present illness. GU:  Negative for dysuria, hematuria, urinary incontinence, urinary frequency, nocturnal urination.  MS: Negative for joint pain, low back pain.  Derm: Negative for rash or itching.  Neuro: Negative for weakness, abnormal sensation, seizure, frequent headaches, memory loss,  confusion.  Psych: Negative for suicidal ideation, hallucinations.  Endo: Negative for unusual weight change.  Heme: Negative for  bruising or bleeding. Allergy: Negative for rash or hives.  Physical Exam   BP 128/83   Pulse 73   Temp 97.9 F (36.6 C)   Ht 5\' 6"  (1.676 m)   Wt 153 lb 9.6 oz (69.7 kg)   BMI 24.79 kg/m    General: Well-nourished, well-developed in no acute distress.  Head: Normocephalic, atraumatic.   Eyes: Conjunctiva pink, no icterus. Mouth: Oropharyngeal mucosa moist and  pink   Neck: Supple without thyromegaly, masses, or lymphadenopathy.  Lungs: Clear to auscultation bilaterally.  Heart: Regular rate and rhythm, no murmurs rubs or gallops.  Abdomen: Bowel sounds are normal, nondistended, no hepatosplenomegaly or masses,  no abdominal bruits or hernia, no rebound or guarding.  Mild right mid abd tenderness Rectal: large external hemorrhoids. Non-thrombosed or bleeding. One on the right side soft but more engorged, slightly tender to touch, no breakdown of mucosa but with several tiny white flat "dots". Possible internal hemorrhoids palpated as well. No obvious mass. No gross blood per rectum.  Extremities: No lower extremity edema. No clubbing or deformities.  Neuro: Alert and oriented x 4 , grossly normal neurologically.  Skin: Warm and dry, no rash or jaundice.   Psych: Alert and cooperative, normal mood and affect.  Labs   Lab Results  Component Value Date   NA 140 07/27/2022   CL 103 07/27/2022   K 3.9 07/27/2022   CO2 24 07/27/2022   BUN 9 07/27/2022   CREATININE 0.79 07/27/2022   EGFR 91 07/27/2022   CALCIUM 9.2 07/27/2022   ALBUMIN 4.3 07/27/2022   GLUCOSE 80 07/27/2022   Lab Results  Component Value Date   ALT 16 07/27/2022   AST 14 07/27/2022   ALKPHOS 76 07/27/2022   BILITOT 0.5 07/27/2022   Lab Results  Component Value Date   WBC 4.1 07/27/2022   HGB 14.8 07/27/2022   HCT 43.4 07/27/2022   MCV 89 07/27/2022   PLT 271 07/27/2022   Lab Results  Component Value Date   TSH 1.680 07/27/2022   Lab Results  Component Value Date   HGBA1C 5.4 07/27/2022   Lab  Results  Component Value Date   FOLATE 14.6 07/19/2022   Lab Results  Component Value Date   IRON 55 07/19/2022   TIBC 311 07/19/2022   Lab Results  Component Value Date   VITAMINB12 557 07/19/2022    Imaging Studies   No results found.  Assessment   *External hemorrhoids *IBS-C *Chronic GERD *Esophageal dysphagia *H/O adenomatous colon polyps *Right sided abdominal pain *Focal hepatic steatosis on liver biopsy  Baseline constipation overall well controlled with Miralax. Occasional need for disimpaction, but not recently. Diagnosed with rectocele which is likely contributing to her issues. She has large external hemorrhoids, one on the right more engorged but no thrombosis, overlying skin somewhat unusual as described above. Ultimately she will need external hemorrhoidectomy but would advise colonoscopy first. She is due this year for history of adenomatous colon polyps.   Chronic GERD with control of heartburn. She has some intermittent solid food dysphagia. Requesting EGD with possible dilation if appropriate.   Chronic intermittent right sided abdominal pain. CT 09/2021 with moderate stool load but liver, appendix, TI normal. Await EGD/colonoscopy findings.   History of focal steatosis on liver biopsy done at time of gb surgery. Her LFTs are normal. She has intentional weight loss of 60+ pounds. Would encourage ongoing healthy lifestyle with regular exercise.    PLAN   Colonoscopy/EGD+/-ED. Patient requesting procedure with first available (Dr. Lanier Prude. Marletta Lor). ASA 2.  I have discussed the risks, alternatives, benefits with regards to but not limited to the risk of reaction to medication, bleeding, infection, perforation and the patient is agreeable to proceed. Written consent to be obtained. Continue lansoprazole daily. Continue miralax daily. She will need hemorrhoidectomy after her colonoscopy has been updated.    Leanna Battles. Melvyn Neth, MHS, PA-C [facility]  I have reviewed the  note and agree with the APP's assessment as described in this progress note  May uptitrate Miralax to 2-3 capfuls per day.  Katrinka Blazing, MD Gastroenterology and Hepatology [facility]

## 2023-03-02 NOTE — Patient Instructions (Addendum)
It was a pleasure to see you today!   We will be in touch to scheduled your colonoscopy and upper endoscopy.  Once your colonoscopy has been updated, I anticipate you will need referral to general surgery for hemorrhoidectomy.   Continue your lansoprazole once daily.  Continue your miralax once daily.    It was a pleasure to see you today. I want to create trusting relationships with patients and provide genuine, compassionate, and quality care. I truly value your feedback, so please be on the lookout for a survey regarding your visit with me today. I appreciate your time in completing this!

## 2023-03-05 ENCOUNTER — Other Ambulatory Visit: Payer: Self-pay | Admitting: *Deleted

## 2023-03-05 DIAGNOSIS — Z86010 Personal history of colonic polyps: Secondary | ICD-10-CM

## 2023-03-05 DIAGNOSIS — K219 Gastro-esophageal reflux disease without esophagitis: Secondary | ICD-10-CM

## 2023-03-05 DIAGNOSIS — R1319 Other dysphagia: Secondary | ICD-10-CM

## 2023-03-05 MED ORDER — PEG 3350-KCL-NA BICARB-NACL 420 G PO SOLR: 4000.0000 mL | Freq: Once | ORAL | 0 refills | Status: AC

## 2023-03-05 NOTE — Telephone Encounter (Signed)
Faxed order for UPT to Dr.Patel

## 2023-03-09 ENCOUNTER — Other Ambulatory Visit: Payer: Self-pay

## 2023-03-09 DIAGNOSIS — R109 Unspecified abdominal pain: Secondary | ICD-10-CM

## 2023-03-09 DIAGNOSIS — R1319 Other dysphagia: Secondary | ICD-10-CM

## 2023-03-09 NOTE — Telephone Encounter (Signed)
Tammy, please arrange for patient to have  Serum chromogranin A  24 urinary 5HIAA.

## 2023-03-13 ENCOUNTER — Other Ambulatory Visit (HOSPITAL_COMMUNITY): Payer: Self-pay

## 2023-03-13 ENCOUNTER — Encounter: Payer: Self-pay | Admitting: Internal Medicine

## 2023-03-13 ENCOUNTER — Other Ambulatory Visit: Payer: Self-pay | Admitting: Internal Medicine

## 2023-03-13 ENCOUNTER — Ambulatory Visit
Admission: EM | Admit: 2023-03-13 | Discharge: 2023-03-13 | Disposition: A | Discharge status: Home / Self Care | Payer: 59 | Attending: Nurse Practitioner | Admitting: Nurse Practitioner

## 2023-03-13 ENCOUNTER — Ambulatory Visit (INDEPENDENT_AMBULATORY_CARE_PROVIDER_SITE_OTHER): Payer: 59

## 2023-03-13 ENCOUNTER — Other Ambulatory Visit: Payer: Self-pay

## 2023-03-13 DIAGNOSIS — R8290 Unspecified abnormal findings in urine: Secondary | ICD-10-CM

## 2023-03-13 DIAGNOSIS — R072 Precordial pain: Secondary | ICD-10-CM | POA: Diagnosis not present

## 2023-03-13 DIAGNOSIS — R222 Localized swelling, mass and lump, trunk: Secondary | ICD-10-CM

## 2023-03-13 DIAGNOSIS — R1319 Other dysphagia: Secondary | ICD-10-CM

## 2023-03-13 DIAGNOSIS — R300 Dysuria: Secondary | ICD-10-CM

## 2023-03-13 DIAGNOSIS — Z86010 Personal history of colonic polyps: Secondary | ICD-10-CM

## 2023-03-13 DIAGNOSIS — K219 Gastro-esophageal reflux disease without esophagitis: Secondary | ICD-10-CM

## 2023-03-13 DIAGNOSIS — R0789 Other chest pain: Secondary | ICD-10-CM | POA: Insufficient documentation

## 2023-03-13 LAB — PREGNANCY, URINE: Preg Test, Ur: NEGATIVE

## 2023-03-13 NOTE — ED Triage Notes (Signed)
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

## 2023-03-13 NOTE — ED Provider Notes (Signed)
[facility]    CSN: [id number] Arrival date & time: 03/13/23  2951      History   Chief Complaint No chief complaint on file.   HPI Theresa [name] is a 52 y.o. female.   The history is provided by the patient.   Patient presents for complaints of a "lump" on her mid chest.  She states the area has been sore for the past 3 to 4 months, but over the last 3 to 4 days, the area has swollen and is tender to palpation.  She states that she was instructed by doctor that she works for her to get a chest x-ray.  Patient denies fever, chills, worsening fatigue, weight loss, difficulty breathing, shortness of breath, cough, or abdominal pain.  Patient reports she has taken Advil and used ice or heat for the symptoms, with minimal relief.  Past Medical History:  Diagnosis Date   Allergy    Allergy to alpha-gal    Anxiety    Colon adenoma DEC 2014   ONE(7 MM) SIMPLE([place])   Concussion with loss of consciousness 02/17/2019   Depression    Edema    GERD (gastroesophageal reflux disease)    History of abnormal cervical Pap smear 12/02/2013   History of nephrolithiasis    HPV (human papilloma virus) infection    Hyperlipidemia    IBS (irritable bowel syndrome)    constipation   Insomnia    Lymphadenopathy 07/12/2020   Obesity 01/14/2014   Positive test for Epstein-Barr virus (EBV) 08/21/2017   Pre-diabetes 09/2014   Superficial basal cell carcinoma (BCC) 12/26/2019   Right Breast- treatment Imiquimoid   Yeast infection 09/04/2013    Patient Active Problem List   Diagnosis Date Noted   Dysphagia 03/02/2023   External hemorrhoids 03/02/2023   Abdominal pain 03/02/2023   DDD (degenerative disc disease), thoracolumbar 12/08/2022   Acute recurrent frontal sinusitis 07/27/2022   Mixed hyperlipidemia 11/24/2021   Allergy to alpha-gal 08/01/2021   Seasonal allergic conjunctivitis 08/01/2021   Seasonal and perennial allergic rhinitis 08/01/2021   Fibromyalgia 05/20/2021   Essential  hypertension 05/20/2021   Prediabetes 08/25/2019   Separation of right acromioclavicular joint 02/17/2019   Idiopathic anaphylaxis 02/12/2019   Chronic rhinitis 02/12/2019   Chronic idiopathic urticaria 02/12/2019   Hx of colonic polyps 07/17/2018   Fatigue 07/12/2015   Arthralgia 07/12/2015   Obesity 01/14/2014   History of abnormal cervical Pap smear 12/02/2013   Internal hemorrhoids with other complication 08/21/2013   Generalized anxiety disorder 12/25/2012   Gastroesophageal reflux disease 10/31/2011   Irritable bowel syndrome with constipation 05/13/2008    Past Surgical History:  Procedure Laterality Date   BIOPSY N/A 08/01/2013   Procedure: BIOPSY;  Surgeon: West Bali, MD;  [facility];  Service: Endoscopy;  Laterality: N/A;  FOR MICROSCOPIC COLITIS   BIOPSY  08/09/2018   Procedure: BIOPSY;  Surgeon: Malissa Hippo, MD;  [facility]: [facility];  Service: Endoscopy;;  gastric   BREAST REDUCTION SURGERY  2009   BREAST SURGERY  2007   breast reduction   CHOLECYSTECTOMY N/A 11/04/2014   Procedure: LAPAROSCOPIC CHOLECYSTECTOMY;  Surgeon: Franky Macho Md, MD;  Location: [facility];  Service: General;  Laterality: N/A;   COLONOSCOPY  Oct 2009   Dr. Leone Payor: int/ext hemorrhoids, ?mild proctitis but path benign, likely r/t irritation from bowel prep   COLONOSCOPY N/A 08/01/2013   Procedure: COLONOSCOPY;  Surgeon: West Bali, MD;  [facility]: [facility];  Service: Endoscopy;  Laterality: N/A;  12:00   COLONOSCOPY WITH PROPOFOL N/A 08/09/2018   Procedure: COLONOSCOPY WITH PROPOFOL;  Surgeon: Malissa Hippo, MD;  [facility];  Service: Endoscopy;  Laterality: N/A;  730   ESOPHAGOGASTRODUODENOSCOPY  Dec 2009   Dr. Leone Payor: reflux esophagitis, proximal gastric polyps, benign   ESOPHAGOGASTRODUODENOSCOPY N/A 12/18/2014   Procedure: ESOPHAGOGASTRODUODENOSCOPY (EGD);  Surgeon: Malissa Hippo, MD;  [facility];  Service: Endoscopy;  Laterality:  N/A;  230   ESOPHAGOGASTRODUODENOSCOPY (EGD) WITH PROPOFOL N/A 08/09/2018   Procedure: ESOPHAGOGASTRODUODENOSCOPY (EGD) WITH PROPOFOL;  Surgeon: Malissa Hippo, MD;  [facility];  Service: Endoscopy;  Laterality: N/A;   HEMORRHOID BANDING N/A 08/01/2013   Procedure: HEMORRHOID BANDING;  Surgeon: West Bali, MD;  [facility];  Service: Endoscopy;  Laterality: N/A;  internal hemorrhoid banding   LIVER BIOPSY N/A 11/04/2014   Procedure: LIVER BIOPSY;  Surgeon: Franky Macho Md, MD;  Location: [facility];  Service: General;  Laterality: N/A;   POLYPECTOMY  2015   Dr.Fields   POLYPECTOMY  08/09/2018   Procedure: POLYPECTOMY;  Surgeon: Malissa Hippo, MD;  [facility];  Service: Endoscopy;;  colon   REDUCTION MAMMAPLASTY Bilateral 2006   UPPER GASTROINTESTINAL ENDOSCOPY  2006   Dr. Clydene Pugh in [place]    OB History     Gravida  1   Para  1   Term      Preterm      AB      Living  1      SAB      IAB      Ectopic      Multiple      Live Births  1            Home Medications    Prior to Admission medications   Medication Sig Start Date End Date Taking? Authorizing Provider  ALPRAZolam Prudy Feeler) 1 MG tablet Take 1 tablet (1 mg) by mouth 2 times daily as needed for anxiety. 01/30/23   Anabel Halon, MD  citalopram (CELEXA) 40 MG tablet Take 1 tablet (40 mg total) by mouth daily. 01/30/23   Anabel Halon, MD  EPINEPHrine (EPIPEN 2-PAK) 0.3 mg/0.3 mL IJ SOAJ injection Inject 0.3 mg into the muscle as needed for anaphylaxis. 07/19/22   Hetty Blend, FNP  fluticasone (FLONASE) 50 MCG/ACT nasal spray Place 1 spray into both nostrils 2 (two) times daily as needed for allergies or rhinitis 07/19/22   Ambs, Norvel Richards, FNP  ipratropium (ATROVENT) 0.06 % nasal spray Place 2 sprays into both nostrils 3 (three) times daily. 04/04/22   Alfonse Spruce, MD  lansoprazole (PREVACID) 30 MG capsule Take 1 capsule (30 mg total) by mouth daily at 12  noon. 12/08/22   Anabel Halon, MD  levonorgestrel (MIRENA) 20 MCG/24HR IUD 1 each by Intrauterine route once.    [provider]  polyethylene glycol (MIRALAX / GLYCOLAX) 17 g packet Take 17 g by mouth daily.    [provider]  Semaglutide-Weight Management 2.4 MG/0.75ML SOAJ Inject 2.4 mg into the skin every 14 (fourteen) days. 01/19/23   [provider]    Family History Family History  Problem Relation Age of Onset   Cancer Mother        head and neck   Hypertension Father    Hyperlipidemia Father    Pancreatic cancer Maternal Grandfather    Colon cancer Neg Hx  Social History Social History   Tobacco Use   Smoking status: Never   Smokeless tobacco: Never   Tobacco comments:    Never smoker  Vaping Use   Vaping status: Never Used  Substance Use Topics   Alcohol use: Yes    Alcohol/week: 0.0 standard drinks of alcohol    Comment: socially   Drug use: No     Allergies   Alpha-gal and Heparin (porcine)   Review of Systems Review of Systems Per HPI  Physical Exam Triage Vital Signs ED Triage Vitals  Encounter Vitals Group     BP 03/13/23 1023 124/79     Systolic BP Percentile --      Diastolic BP Percentile --      Pulse Rate 03/13/23 1023 70     Resp 03/13/23 1023 11     Temp 03/13/23 1023 97.7 F (36.5 C)     Temp Source 03/13/23 1023 Oral     SpO2 03/13/23 1023 100 %     Weight --      Height --      Head Circumference --      Peak Flow --      Pain Score 03/13/23 1025 4     Pain Loc --      Pain Education --      Exclude from Growth Chart --    No data found.  Updated Vital Signs BP 124/79 (BP Location: Right Arm)   Pulse 70   Temp 97.7 F (36.5 C) (Oral)   Resp 11   SpO2 100%   Visual Acuity Right Eye Distance:   Left Eye Distance:   Bilateral Distance:    Right Eye Near:   Left Eye Near:    Bilateral Near:     Physical Exam Vitals and nursing note reviewed.  Constitutional:      General: She is  not in acute distress.    Appearance: Normal appearance.  HENT:     Head: Normocephalic.  Eyes:     Extraocular Movements: Extraocular movements intact.     Pupils: Pupils are equal, round, and reactive to light.  Cardiovascular:     Rate and Rhythm: Normal rate and regular rhythm.     Pulses: Normal pulses.     Heart sounds: Normal heart sounds.  Pulmonary:     Effort: Pulmonary effort is normal. No respiratory distress.     Breath sounds: Normal breath sounds. No stridor. No wheezing, rhonchi or rales.  Chest:     Chest wall: Tenderness present.    Abdominal:     General: Bowel sounds are normal.     Palpations: Abdomen is soft.  Musculoskeletal:     Cervical back: Normal range of motion.  Lymphadenopathy:     Cervical: No cervical adenopathy.  Skin:    General: Skin is warm and dry.  Neurological:     General: No focal deficit present.     Mental Status: She is alert and oriented to person, place, and time.  Psychiatric:        Mood and Affect: Mood normal.        Behavior: Behavior normal.      UC Treatments / Results  Labs (all labs ordered are listed, but only abnormal results are displayed) Labs Reviewed - No data to display  EKG   Radiology DG Chest 2 View  Result Date: 03/13/2023 CLINICAL DATA:  sweling to midsternal area of chest with pain and tenderness EXAM: CHEST - 2  VIEW COMPARISON:  Chest x-ray September 16, 2014. FINDINGS: The heart size and mediastinal contours are within normal limits. Both lungs are clear. No visible pleural effusions or pneumothorax. No acute osseous abnormality. IMPRESSION: No active cardiopulmonary disease. Electronically Signed   By: Feliberto Harts M.D.   On: 03/13/2023 11:29    Procedures Procedures (including critical care time)  Medications Ordered in UC Medications - No data to display  Initial Impression / Assessment and Plan / UC Course  I have reviewed the triage vital signs and the nursing notes.  Pertinent  labs & imaging results that were available during my care of the patient were reviewed by me and considered in my medical decision making (see chart for details).  The patient is well-appearing, she is in no acute distress, vital signs are stable.  X-ray is negative for active cardiopulmonary disease or other abnormalities.  Supportive care recommendations were provided and discussed with the patient to include over-the-counter analgesics and the application of ice.  Patient most likely will need an MRI or CT scan if symptoms continue, will have patient follow-up with her primary care physician.  Patient is in agreement with this plan of care and verbalizes understanding.  All questions were answered.  Patient stable for discharge.  Final Clinical Impressions(s) / UC Diagnoses   Final diagnoses:  Chest wall tenderness  Soft tissue swelling of chest wall     Discharge Instructions      The x-ray does not show any abnormalities. Continue over-the-counter analgesics for pain or discomfort. Continue to apply ice as needed.  Apply for 20 minutes, remove for 1 hour, repeat as needed. Please follow-up with your primary care physician for further evaluation. Follow-up as needed.     ED Prescriptions   None    PDMP not reviewed this encounter.   Abran Cantor, NP 03/13/23 1148

## 2023-03-13 NOTE — Addendum Note (Signed)
Addended by: Geradine Girt B on: 03/13/2023 08:05 AM   Modules accepted: Orders

## 2023-03-13 NOTE — Discharge Instructions (Signed)
The x-ray does not show any abnormalities. Continue over-the-counter analgesics for pain or discomfort. Continue to apply ice as needed.  Apply for 20 minutes, remove for 1 hour, repeat as needed. Please follow-up with your primary care physician for further evaluation. Follow-up as needed.

## 2023-03-15 ENCOUNTER — Other Ambulatory Visit: Payer: Self-pay

## 2023-03-15 ENCOUNTER — Ambulatory Visit (HOSPITAL_BASED_OUTPATIENT_CLINIC_OR_DEPARTMENT_OTHER): Payer: 59 | Admitting: Certified Registered Nurse Anesthetist

## 2023-03-15 ENCOUNTER — Ambulatory Visit (HOSPITAL_COMMUNITY)
Admission: RE | Admit: 2023-03-15 | Discharge: 2023-03-15 | Disposition: A | Discharge status: Home / Self Care | Payer: 59 | Attending: Internal Medicine | Admitting: Internal Medicine

## 2023-03-15 ENCOUNTER — Ambulatory Visit (HOSPITAL_COMMUNITY): Payer: 59 | Admitting: Certified Registered Nurse Anesthetist

## 2023-03-15 ENCOUNTER — Telehealth: Payer: Self-pay | Admitting: Internal Medicine

## 2023-03-15 ENCOUNTER — Encounter (HOSPITAL_COMMUNITY): Payer: Self-pay

## 2023-03-15 ENCOUNTER — Encounter (HOSPITAL_COMMUNITY)
Admission: RE | Disposition: A | Discharge status: Home / Self Care | Payer: Self-pay | Source: Home / Self Care | Attending: Internal Medicine

## 2023-03-15 DIAGNOSIS — Z1211 Encounter for screening for malignant neoplasm of colon: Secondary | ICD-10-CM | POA: Diagnosis not present

## 2023-03-15 DIAGNOSIS — K219 Gastro-esophageal reflux disease without esophagitis: Secondary | ICD-10-CM | POA: Diagnosis not present

## 2023-03-15 DIAGNOSIS — K317 Polyp of stomach and duodenum: Secondary | ICD-10-CM | POA: Diagnosis not present

## 2023-03-15 DIAGNOSIS — K760 Fatty (change of) liver, not elsewhere classified: Secondary | ICD-10-CM | POA: Insufficient documentation

## 2023-03-15 DIAGNOSIS — R1310 Dysphagia, unspecified: Secondary | ICD-10-CM | POA: Insufficient documentation

## 2023-03-15 DIAGNOSIS — K581 Irritable bowel syndrome with constipation: Secondary | ICD-10-CM | POA: Insufficient documentation

## 2023-03-15 DIAGNOSIS — K635 Polyp of colon: Secondary | ICD-10-CM

## 2023-03-15 DIAGNOSIS — Z9049 Acquired absence of other specified parts of digestive tract: Secondary | ICD-10-CM | POA: Diagnosis not present

## 2023-03-15 DIAGNOSIS — F419 Anxiety disorder, unspecified: Secondary | ICD-10-CM | POA: Insufficient documentation

## 2023-03-15 DIAGNOSIS — F32A Depression, unspecified: Secondary | ICD-10-CM | POA: Diagnosis not present

## 2023-03-15 DIAGNOSIS — K644 Residual hemorrhoidal skin tags: Secondary | ICD-10-CM | POA: Insufficient documentation

## 2023-03-15 DIAGNOSIS — I10 Essential (primary) hypertension: Secondary | ICD-10-CM | POA: Diagnosis not present

## 2023-03-15 DIAGNOSIS — Z79899 Other long term (current) drug therapy: Secondary | ICD-10-CM | POA: Insufficient documentation

## 2023-03-15 DIAGNOSIS — Z86010 Personal history of colonic polyps: Secondary | ICD-10-CM | POA: Diagnosis not present

## 2023-03-15 DIAGNOSIS — D122 Benign neoplasm of ascending colon: Secondary | ICD-10-CM

## 2023-03-15 DIAGNOSIS — K648 Other hemorrhoids: Secondary | ICD-10-CM

## 2023-03-15 DIAGNOSIS — K649 Unspecified hemorrhoids: Secondary | ICD-10-CM

## 2023-03-15 HISTORY — PX: COLONOSCOPY WITH PROPOFOL: SHX5780

## 2023-03-15 HISTORY — PX: POLYPECTOMY: SHX5525

## 2023-03-15 HISTORY — PX: ESOPHAGOGASTRODUODENOSCOPY (EGD) WITH PROPOFOL: SHX5813

## 2023-03-15 SURGERY — COLONOSCOPY WITH PROPOFOL
Anesthesia: General

## 2023-03-15 MED ORDER — LACTATED RINGERS IV SOLN: INTRAVENOUS | Status: DC

## 2023-03-15 MED ORDER — PROPOFOL 500 MG/50ML IV EMUL
INTRAVENOUS | Status: DC | PRN
  Administered 2023-03-15: 200 ug/kg/min via INTRAVENOUS

## 2023-03-15 MED ORDER — LIDOCAINE HCL (CARDIAC) PF 100 MG/5ML IV SOSY
PREFILLED_SYRINGE | INTRAVENOUS | Status: DC | PRN
  Administered 2023-03-15: 50 mg via INTRAVENOUS

## 2023-03-15 MED ORDER — LACTATED RINGERS IV SOLN: INTRAVENOUS | Status: DC | PRN

## 2023-03-15 MED ORDER — PROPOFOL 10 MG/ML IV BOLUS
INTRAVENOUS | Status: DC | PRN
  Administered 2023-03-15: 100 mg via INTRAVENOUS

## 2023-03-15 NOTE — Telephone Encounter (Signed)
Please refer this patient to [facility] for hemorrhoids.  Patient is requesting Dr. Henreitta Leber.  Thank you

## 2023-03-15 NOTE — Op Note (Signed)
[facility] Patient Name: Theresa [name] Procedure Date: 03/15/2023 10:18 AM MRN: [id number] Date of Birth: August 25, 1970 Attending MD: Hennie Duos. Marletta Lor , [place], [id number] CSN: [id number] Age: 52 Admit Type: Outpatient Procedure:                Upper GI endoscopy Indications:              Dysphagia, Heartburn Providers:                Hennie Duos. Marletta Lor, DO, Buel Ream. Thomasena Edis RN, RN,                            Pandora Leiter, Technician Referring MD:              Medicines:                See the Anesthesia note for documentation of the                            administered medications Complications:            No immediate complications. Estimated Blood Loss:     Estimated blood loss was minimal. Procedure:                Pre-Anesthesia Assessment:                           - The anesthesia plan was to use monitored                            anesthesia care (MAC).                           After obtaining informed consent, the endoscope was                            passed under direct vision. Throughout the                            procedure, the patient's blood pressure, pulse, and                            oxygen saturations were monitored continuously. The                            [id number]-H190 ([id number]) scope was introduced through the                            mouth, and advanced to the second part of duodenum.                            The upper GI endoscopy was accomplished without                            difficulty. The patient tolerated the procedure                            well. Scope In:  10:22:49 AM Scope Out: 10:37:22 AM Total Procedure Duration: 0 hours 14 minutes 33 seconds  Findings:      The examined esophagus was normal.      Multiple 5 to 15 mm semi-pedunculated polyps with no bleeding and no       stigmata of recent bleeding were found in the stomach. 5 of the polyps       >1cm removed with a hot snare. Resection and retrieval were complete       with  Lucina Mellow net.      The duodenal bulb, first portion of the duodenum and second portion of       the duodenum were normal. Impression:               - Normal esophagus.                           - Multiple gastric polyps. Resected and retrieved.                           - Normal duodenal bulb, first portion of the                            duodenum and second portion of the duodenum. Moderate Sedation:      Per Anesthesia Care Recommendation:           - Patient has a contact number available for                            emergencies. The signs and symptoms of potential                            delayed complications were discussed with the                            patient. Return to normal activities tomorrow.                            Written discharge instructions were provided to the                            patient.                           - Resume previous diet.                           - Continue present medications.                           - Await pathology results.                           - Return to GI clinic in 3 months. Procedure Code(s):        --- Professional ---                           [phone], Esophagogastroduodenoscopy, flexible,  transoral; with removal of tumor(s), polyp(s), or                            other lesion(s) by snare technique Diagnosis Code(s):        --- Professional ---                           K31.7, Polyp of stomach and duodenum                           R13.10, Dysphagia, unspecified                           R12, Heartburn CPT copyright 2022 American [employer] Association. All rights reserved. The codes documented in this report are preliminary and upon coder review may  be revised to meet current compliance requirements. Hennie Duos. Marletta Lor, DO Hennie Duos. Marletta Lor, DO 03/15/2023 10:39:29 AM This report has been signed electronically. Number of Addenda: 0

## 2023-03-15 NOTE — Transfer of Care (Signed)
Immediate Anesthesia Transfer of Care Note  Patient: Theresa [name]  Procedure(s) Performed: COLONOSCOPY WITH PROPOFOL ESOPHAGOGASTRODUODENOSCOPY (EGD) WITH PROPOFOL POLYPECTOMY  Patient Location: Endoscopy Unit  Anesthesia Type:General  Level of Consciousness: awake  Airway & Oxygen Therapy: Patient Spontanous Breathing  Post-op Assessment: Report given to RN and Post -op Vital signs reviewed and stable  Post vital signs: Reviewed and stable  Last Vitals:  Vitals Value Taken Time  BP 91/58 03/15/23 1056  Temp 36.6 C 03/15/23 1056  Pulse 76 03/15/23 1056  Resp 14 03/15/23 1056  SpO2 100 % 03/15/23 1056    Last Pain:  Vitals:   03/15/23 1056  TempSrc: Axillary  PainSc:          Complications: No notable events documented.

## 2023-03-15 NOTE — Discharge Instructions (Addendum)
EGD Discharge instructions Please read the instructions outlined below and refer to this sheet in the next few weeks. These discharge instructions provide you with general information on caring for yourself after you leave the hospital. Your doctor may also give you specific instructions. While your treatment has been planned according to the most current medical practices available, unavoidable complications occasionally occur. If you have any problems or questions after discharge, please call your doctor. ACTIVITY You may resume your regular activity but move at a slower pace for the next 24 hours.  Take frequent rest periods for the next 24 hours.  Walking will help expel (get rid of) the air and reduce the bloated feeling in your abdomen.  No driving for 24 hours (because of the anesthesia (medicine) used during the test).  You may shower.  Do not sign any important legal documents or operate any machinery for 24 hours (because of the anesthesia used during the test).  NUTRITION Drink plenty of fluids.  You may resume your normal diet.  Begin with a light meal and progress to your normal diet.  Avoid alcoholic beverages for 24 hours or as instructed by your caregiver.  MEDICATIONS You may resume your normal medications unless your caregiver tells you otherwise.  WHAT YOU CAN EXPECT TODAY You may experience abdominal discomfort such as a feeling of fullness or "gas" pains.  FOLLOW-UP Your doctor will discuss the results of your test with you.  SEEK IMMEDIATE MEDICAL ATTENTION IF ANY OF THE FOLLOWING OCCUR: Excessive nausea (feeling sick to your stomach) and/or vomiting.  Severe abdominal pain and distention (swelling).  Trouble swallowing.  Temperature over 101 F (37.8 C).  Rectal bleeding or vomiting of blood.     Colonoscopy Discharge Instructions  Read the instructions outlined below and refer to this sheet in the next few weeks. These discharge instructions provide you with  general information on caring for yourself after you leave the hospital. Your doctor may also give you specific instructions. While your treatment has been planned according to the most current medical practices available, unavoidable complications occasionally occur.   ACTIVITY You may resume your regular activity, but move at a slower pace for the next 24 hours.  Take frequent rest periods for the next 24 hours.  Walking will help get rid of the air and reduce the bloated feeling in your belly (abdomen).  No driving for 24 hours (because of the medicine (anesthesia) used during the test).   Do not sign any important legal documents or operate any machinery for 24 hours (because of the anesthesia used during the test).  NUTRITION Drink plenty of fluids.  You may resume your normal diet as instructed by your doctor.  Begin with a light meal and progress to your normal diet. Heavy or fried foods are harder to digest and may make you feel sick to your stomach (nauseated).  Avoid alcoholic beverages for 24 hours or as instructed.  MEDICATIONS You may resume your normal medications unless your doctor tells you otherwise.  WHAT YOU CAN EXPECT TODAY Some feelings of bloating in the abdomen.  Passage of more gas than usual.  Spotting of blood in your stool or on the toilet paper.  IF YOU HAD POLYPS REMOVED DURING THE COLONOSCOPY: No aspirin products for 7 days or as instructed.  No alcohol for 7 days or as instructed.  Eat a soft diet for the next 24 hours.  FINDING OUT THE RESULTS OF YOUR TEST Not all test results are  available during your visit. If your test results are not back during the visit, make an appointment with your caregiver to find out the results. Do not assume everything is normal if you have not heard from your caregiver or the medical facility. It is important for you to follow up on all of your test results.  SEEK IMMEDIATE MEDICAL ATTENTION IF: You have more than a spotting of  blood in your stool.  Your belly is swollen (abdominal distention).  You are nauseated or vomiting.  You have a temperature over 101.  You have abdominal pain or discomfort that is severe or gets worse throughout the day.   Your upper endoscopy revealed multiple large polyps in your stomach.  These were previously sampled by Dr. Karilyn Cota found to be benign fundic gland.  However I did remove 5 of them that were all greater than 1 cm.  Esophagus and small bowel appeared normal.  Your colonoscopy revealed 1 polyp(s) which I removed successfully. Await pathology results, my office will contact you. I recommend repeating colonoscopy in 7 years for surveillance purposes.   You also have external and internal hemorrhoids. I would recommend increasing fiber in your diet or adding OTC Benefiber/Metamucil. Be sure to drink at least 4 to 6 glasses of water daily.   I would be happy to refer you to a general surgeon to discuss role of hemorrhoidectomy.   Otherwise follow up in GI office in 3 months OFFICE TO CALL WITH APPOINTMENT I hope you have a great rest of your week!  Hennie Duos. Marletta Lor, D.O. Gastroenterology and Hepatology [facility]

## 2023-03-15 NOTE — Interval H&P Note (Signed)
History and Physical Interval Note:  03/15/2023 10:12 AM  Theresa [name]  has presented today for surgery, with the diagnosis of history of colon polyps,GERD,dysphagia.  The various methods of treatment have been discussed with the patient and family. After consideration of risks, benefits and other options for treatment, the patient has consented to  Procedure(s) with comments: COLONOSCOPY WITH PROPOFOL (N/A) - 10:30 am, asa 2 ESOPHAGOGASTRODUODENOSCOPY (EGD) WITH PROPOFOL (N/A) BALLOON DILATION (N/A) as a surgical intervention.  The patient's history has been reviewed, patient examined, no change in status, stable for surgery.  I have reviewed the patient's chart and labs.  Questions were answered to the patient's satisfaction.     Lanelle Bal

## 2023-03-15 NOTE — Telephone Encounter (Signed)
Referral placed.

## 2023-03-15 NOTE — Op Note (Signed)
[facility] Patient Name: Theresa [name] Procedure Date: 03/15/2023 10:17 AM MRN: [id number] Date of Birth: 1971-06-19 Attending MD: Hennie Duos. Marletta Lor , [place], [id number] CSN: [id number] Age: 52 Admit Type: Outpatient Procedure:                Colonoscopy Indications:              Surveillance: Personal history of adenomatous                            polyps on last colonoscopy 5 years ago Providers:                Hennie Duos. Marletta Lor, DO, Buel Ream. Thomasena Edis RN, RN,                            Pandora Leiter, Technician Referring MD:              Medicines:                See the Anesthesia note for documentation of the                            administered medications Complications:            No immediate complications. Estimated Blood Loss:     Estimated blood loss was minimal. Procedure:                Pre-Anesthesia Assessment:                           - The anesthesia plan was to use monitored                            anesthesia care (MAC).                           After obtaining informed consent, the colonoscope                            was passed under direct vision. Throughout the                            procedure, the patient's blood pressure, pulse, and                            oxygen saturations were monitored continuously. The                            [id number]-HQ190L ([id number]) scope was introduced through                            the anus and advanced to the the cecum, identified                            by appendiceal orifice and ileocecal valve. The                            colonoscopy was  performed without difficulty. The                            patient tolerated the procedure well. The quality                            of the bowel preparation was evaluated using the                            BBPS [facility] Bowel Preparation Scale) with scores                            of: Right Colon = 3, Transverse Colon = 3 and Left                            Colon =  3 (entire mucosa seen well with no residual                            staining, small fragments of stool or opaque                            liquid). The total BBPS score equals 9. Scope In: 10:42:53 AM Scope Out: 10:53:54 AM Scope Withdrawal Time: 0 hours 8 minutes 19 seconds  Total Procedure Duration: 0 hours 11 minutes 1 second  Findings:      Hemorrhoids were found on perianal exam.      Non-bleeding internal hemorrhoids were found during retroflexion.      A 4 mm polyp was found in the ascending colon. The polyp was sessile.       The polyp was removed with a cold snare. Resection and retrieval were       complete.      The terminal ileum appeared normal.      The exam was otherwise without abnormality. Impression:               - Hemorrhoids found on perianal exam.                           - Non-bleeding internal hemorrhoids.                           - One 4 mm polyp in the ascending colon, removed                            with a cold snare. Resected and retrieved.                           - The examined portion of the ileum was normal.                           - The examination was otherwise normal. Moderate Sedation:      Per Anesthesia Care Recommendation:           - Patient has a contact number available for  emergencies. The signs and symptoms of potential                            delayed complications were discussed with the                            patient. Return to normal activities tomorrow.                            Written discharge instructions were provided to the                            patient.                           - Resume previous diet.                           - Continue present medications.                           - Await pathology results.                           - Repeat colonoscopy in 7 years for surveillance.                           - Return to GI clinic in 3 months. Procedure Code(s):        ---  Professional ---                           [phone], Colonoscopy, flexible; with removal of                            tumor(s), polyp(s), or other lesion(s) by snare                            technique Diagnosis Code(s):        --- Professional ---                           Z86.010, Personal history of colonic polyps                           D12.2, Benign neoplasm of ascending colon                           K64.8, Other hemorrhoids CPT copyright 2022 American Medical Association. All rights reserved. The codes documented in this report are preliminary and upon coder review may  be revised to meet current compliance requirements. Hennie Duos. Marletta Lor, DO Hennie Duos. Marletta Lor, DO 03/15/2023 10:56:44 AM This report has been signed electronically. Number of Addenda: 0

## 2023-03-15 NOTE — Anesthesia Preprocedure Evaluation (Signed)
Anesthesia Evaluation  Patient identified by MRN, date of birth, ID band Patient awake    Reviewed: Allergy & Precautions, H&P , NPO status , Patient's Chart, lab work & pertinent test results, reviewed documented beta blocker date and time   Airway Mallampati: II  TM Distance: >3 FB Neck ROM: full    Dental no notable dental hx.    Pulmonary neg pulmonary ROS   Pulmonary exam normal breath sounds clear to auscultation       Cardiovascular Exercise Tolerance: Good hypertension, negative cardio ROS  Rhythm:regular Rate:Normal     Neuro/Psych  PSYCHIATRIC DISORDERS Anxiety Depression     Neuromuscular disease negative neurological ROS  negative psych ROS   GI/Hepatic negative GI ROS, Neg liver ROS,GERD  ,,  Endo/Other  negative endocrine ROS    Renal/GU negative Renal ROS  negative genitourinary   Musculoskeletal   Abdominal   Peds  Hematology negative hematology ROS (+)   Anesthesia Other Findings   Reproductive/Obstetrics negative OB ROS                             Anesthesia Physical Anesthesia Plan  ASA: 3  Anesthesia Plan: General   Post-op Pain Management:    Induction:   PONV Risk Score and Plan:   Airway Management Planned:   Additional Equipment:   Intra-op Plan:   Post-operative Plan:   Informed Consent: I have reviewed the patients History and Physical, chart, labs and discussed the procedure including the risks, benefits and alternatives for the proposed anesthesia with the patient or authorized representative who has indicated his/her understanding and acceptance.     Dental Advisory Given  Plan Discussed with: CRNA  Anesthesia Plan Comments:        Anesthesia Quick Evaluation

## 2023-03-15 NOTE — Addendum Note (Signed)
Addended by: Armstead Peaks on: 03/15/2023 11:19 AM   Modules accepted: Orders

## 2023-03-16 NOTE — Anesthesia Postprocedure Evaluation (Signed)
Anesthesia Post Note  Patient: Haili Donofrio  Procedure(s) Performed: COLONOSCOPY WITH PROPOFOL ESOPHAGOGASTRODUODENOSCOPY (EGD) WITH PROPOFOL POLYPECTOMY  Patient location during evaluation: Phase II Anesthesia Type: General Level of consciousness: awake Pain management: pain level controlled Vital Signs Assessment: post-procedure vital signs reviewed and stable Respiratory status: spontaneous breathing and respiratory function stable Cardiovascular status: blood pressure returned to baseline and stable Postop Assessment: no headache and no apparent nausea or vomiting Anesthetic complications: no Comments: Late entry   No notable events documented.   Last Vitals:  Vitals:   03/15/23 0907 03/15/23 1056  BP: (!) 140/89 (!) 91/58  Pulse: 64 76  Resp: 12 14  Temp: 36.7 C 36.6 C  SpO2: 100% 100%    Last Pain:  Vitals:   03/15/23 1059  TempSrc:   PainSc: 0-No pain                 Windell Norfolk

## 2023-03-21 ENCOUNTER — Encounter (HOSPITAL_COMMUNITY): Payer: Self-pay | Admitting: Internal Medicine

## 2023-03-21 NOTE — Progress Notes (Signed)
This encounter was created in error - please disregard.

## 2023-03-30 DIAGNOSIS — R779 Abnormality of plasma protein, unspecified: Secondary | ICD-10-CM | POA: Diagnosis not present

## 2023-04-13 ENCOUNTER — Encounter (HOSPITAL_COMMUNITY): Payer: Self-pay

## 2023-04-13 ENCOUNTER — Other Ambulatory Visit (HOSPITAL_COMMUNITY): Payer: Self-pay

## 2023-04-13 ENCOUNTER — Encounter: Payer: Self-pay | Admitting: Internal Medicine

## 2023-04-13 ENCOUNTER — Ambulatory Visit (INDEPENDENT_AMBULATORY_CARE_PROVIDER_SITE_OTHER): Payer: 59 | Admitting: Internal Medicine

## 2023-04-13 VITALS — BP 121/81 | HR 84 | Ht 66.0 in | Wt 149.2 lb

## 2023-04-13 DIAGNOSIS — K219 Gastro-esophageal reflux disease without esophagitis: Secondary | ICD-10-CM | POA: Diagnosis not present

## 2023-04-13 DIAGNOSIS — E6609 Other obesity due to excess calories: Secondary | ICD-10-CM | POA: Diagnosis not present

## 2023-04-13 DIAGNOSIS — M797 Fibromyalgia: Secondary | ICD-10-CM

## 2023-04-13 DIAGNOSIS — F411 Generalized anxiety disorder: Secondary | ICD-10-CM

## 2023-04-13 DIAGNOSIS — Z23 Encounter for immunization: Secondary | ICD-10-CM | POA: Diagnosis not present

## 2023-04-13 MED ORDER — LANSOPRAZOLE 30 MG PO CPDR
30.0000 mg | DELAYED_RELEASE_CAPSULE | Freq: Every day | ORAL | 3 refills | Status: DC
Start: 2023-04-13 — End: 2023-10-12
  Filled 2023-04-13 – 2023-06-01 (×2): qty 90, 90d supply, fill #0
  Filled 2023-09-12: qty 90, 90d supply, fill #1

## 2023-04-13 NOTE — Assessment & Plan Note (Signed)
Well-controlled with Xanax 1 mg BID PRN and Celexa Had switched to Cymbalta for benefit with fibromyalgia, but did not tolerate it well

## 2023-04-13 NOTE — Assessment & Plan Note (Signed)
BMI Readings from Last 3 Encounters:  04/13/23 24.08 kg/m  03/15/23 24.21 kg/m  03/02/23 24.79 kg/m   Advised to follow low-carb diet and perform moderate exercise at least 150 minutes/week Has prediabetes, HLD, HTN and GERD as well She is a good candidate for the GLP-1 agonist - was on Wegovy - initial BMI - 34.31 She is on semaglutide formulation through [facility], needs to continue low carb diet

## 2023-04-13 NOTE — Progress Notes (Signed)
Established Patient Office Visit  Subjective:  Patient ID: Theresa [name], female    DOB: 03-Jan-1971  Age: 52 y.o. MRN: [id number]  CC:  Chief Complaint  Patient presents with   Follow-up    Follow up recently had colonoscopy getting weight loss injections     HPI Theresa [name] is a 52 y.o. female with past medical history of GERD, chronic rhinitis, GAD, fibromyalgia and prediabetes who presents for f/u of her chronic medical conditions.  She has history of fibromyalgia, and was given trial of Cymbalta, but she did not tolerate it.  She has tried massage therapy with mild relief. She has chronic low back pain, denies any numbness or tingling of LE.  Denies saddle anesthesia, urinary or stool incontinence.  She has started taking semaglutide formulation through [facility]. Her nausea and vomiting have resolved now.  Her weight is stable overall.  She has been taking Celexa and Xanax as needed for anxiety. She had to take Xanax BID on some days for insomnia and GAD as well. Her sleep has improved now.    Past Medical History:  Diagnosis Date   Allergy    Allergy to alpha-gal    Anxiety    Colon adenoma DEC 2014   ONE(7 MM) SIMPLE([place])   Concussion with loss of consciousness 02/17/2019   Depression    Edema    GERD (gastroesophageal reflux disease)    History of abnormal cervical Pap smear 12/02/2013   History of nephrolithiasis    HPV (human papilloma virus) infection    Hyperlipidemia    IBS (irritable bowel syndrome)    constipation   Insomnia    Lymphadenopathy 07/12/2020   Obesity 01/14/2014   Positive test for Epstein-Barr virus (EBV) 08/21/2017   Pre-diabetes 09/2014   Superficial basal cell carcinoma (BCC) 12/26/2019   Right Breast- treatment Imiquimoid   Yeast infection 09/04/2013    Past Surgical History:  Procedure Laterality Date   BIOPSY N/A 08/01/2013   Procedure: BIOPSY;  Surgeon: West Bali, MD;  [facility];  Service: Endoscopy;   Laterality: N/A;  FOR MICROSCOPIC COLITIS   BIOPSY  08/09/2018   Procedure: BIOPSY;  Surgeon: Malissa Hippo, MD;  [facility];  Service: Endoscopy;;  gastric   BREAST REDUCTION SURGERY  2009   BREAST SURGERY  2007   breast reduction   CHOLECYSTECTOMY N/A 11/04/2014   Procedure: LAPAROSCOPIC CHOLECYSTECTOMY;  Surgeon: Franky Macho Md, MD;  Location: [facility];  Service: General;  Laterality: N/A;   COLONOSCOPY  Oct 2009   Dr. Leone Payor: int/ext hemorrhoids, ?mild proctitis but path benign, likely r/t irritation from bowel prep   COLONOSCOPY N/A 08/01/2013   Procedure: COLONOSCOPY;  Surgeon: West Bali, MD;  [facility];  Service: Endoscopy;  Laterality: N/A;  12:00   COLONOSCOPY WITH PROPOFOL N/A 08/09/2018   Procedure: COLONOSCOPY WITH PROPOFOL;  Surgeon: Malissa Hippo, MD;  [facility];  Service: Endoscopy;  Laterality: N/A;  730   COLONOSCOPY WITH PROPOFOL N/A 03/15/2023   Procedure: COLONOSCOPY WITH PROPOFOL;  Surgeon: Lanelle Bal, DO;  [facility];  Service: Endoscopy;  Laterality: N/A;  10:30 am, asa 2   ESOPHAGOGASTRODUODENOSCOPY  Dec 2009   Dr. Leone Payor: reflux esophagitis, proximal gastric polyps, benign   ESOPHAGOGASTRODUODENOSCOPY N/A 12/18/2014   Procedure: ESOPHAGOGASTRODUODENOSCOPY (EGD);  Surgeon: Malissa Hippo, MD;  [facility];  Service: Endoscopy;  Laterality: N/A;  230   ESOPHAGOGASTRODUODENOSCOPY (EGD) WITH  PROPOFOL N/A 08/09/2018   Procedure: ESOPHAGOGASTRODUODENOSCOPY (EGD) WITH PROPOFOL;  Surgeon: Malissa Hippo, MD;  [facility];  Service: Endoscopy;  Laterality: N/A;   ESOPHAGOGASTRODUODENOSCOPY (EGD) WITH PROPOFOL N/A 03/15/2023   Procedure: ESOPHAGOGASTRODUODENOSCOPY (EGD) WITH PROPOFOL;  Surgeon: Lanelle Bal, DO;  [facility];  Service: Endoscopy;  Laterality: N/A;   HEMORRHOID BANDING N/A 08/01/2013   Procedure: HEMORRHOID BANDING;  Surgeon: West Bali, MD;   [facility];  Service: Endoscopy;  Laterality: N/A;  internal hemorrhoid banding   LIVER BIOPSY N/A 11/04/2014   Procedure: LIVER BIOPSY;  Surgeon: Franky Macho Md, MD;  Location: [facility];  Service: General;  Laterality: N/A;   POLYPECTOMY  2015   Dr.Fields   POLYPECTOMY  08/09/2018   Procedure: POLYPECTOMY;  Surgeon: Malissa Hippo, MD;  [facility];  Service: Endoscopy;;  colon   POLYPECTOMY  03/15/2023   Procedure: POLYPECTOMY;  Surgeon: Lanelle Bal, DO;  [facility];  Service: Endoscopy;;  gastric/ascending colon   REDUCTION MAMMAPLASTY Bilateral 2006   UPPER GASTROINTESTINAL ENDOSCOPY  2006   Dr. Clydene Pugh in [place]    Family History  Problem Relation Age of Onset   Cancer Mother        head and neck   Hypertension Father    Hyperlipidemia Father    Pancreatic cancer Maternal Grandfather    Colon cancer Neg Hx     Social History   Socioeconomic History   Marital status: Married    Spouse name: Not on file   Number of children: 1   Years of education: Not on file   Highest education level: Associate degree: academic program  Occupational History   Occupation: Teacher, adult education: [employer]  Tobacco Use   Smoking status: Never   Smokeless tobacco: Never   Tobacco comments:    Never smoker  Vaping Use   Vaping status: Never Used  Substance and Sexual Activity   Alcohol use: Yes    Alcohol/week: 0.0 standard drinks of alcohol    Comment: socially   Drug use: No   Sexual activity: Yes    Birth control/protection: I.U.D.  Other Topics Concern   Not on file  Social History Narrative   Not on file   Social Determinants of Health   Financial Resource Strain: Low Risk  (12/08/2022)   Overall Financial Resource Strain (CARDIA)    Difficulty of Paying Living Expenses: Not hard at all  Food Insecurity: No Food Insecurity (12/08/2022)   Hunger Vital Sign    Worried About Running Out of Food in the Last Year: Never true    Ran  Out of Food in the Last Year: Never true  Transportation Needs: No Transportation Needs (12/08/2022)   PRAPARE - Administrator, Civil Service (Medical): No    Lack of Transportation (Non-Medical): No  Physical Activity: Unknown (12/08/2022)   Exercise Vital Sign    Days of Exercise per Week: 0 days    Minutes of Exercise per Session: Not on file  Stress: No Stress Concern Present (12/08/2022)   [facility]resa [name] [employer] - Occupational Stress Questionnaire    Feeling of Stress : Not at all  Social Connections: Moderately Integrated (12/08/2022)   Social Connection and Isolation Panel [NHANES]    Frequency of Communication with Friends and Family: More than three times a week    Frequency of Social Gatherings with Friends and Family: Twice a week  Attends Religious Services: 1 to 4 times per year    Active Member of Clubs or Organizations: No    Attends Engineer, structural: Not on file    Marital Status: Married  Catering manager Violence: Not on file    Outpatient Medications Prior to Visit  Medication Sig Dispense Refill   ALPRAZolam (XANAX) 1 MG tablet Take 1 tablet (1 mg) by mouth 2 times daily as needed for anxiety. 60 tablet 3   citalopram (CELEXA) 40 MG tablet Take 1 tablet (40 mg total) by mouth daily. 90 tablet 1   EPINEPHrine (EPIPEN 2-PAK) 0.3 mg/0.3 mL IJ SOAJ injection Inject 0.3 mg into the muscle as needed for anaphylaxis. 1 each 1   fluticasone (FLONASE) 50 MCG/ACT nasal spray Place 1 spray into both nostrils 2 (two) times daily as needed for allergies or rhinitis 16 g 11   ipratropium (ATROVENT) 0.06 % nasal spray Place 2 sprays into both nostrils 3 (three) times daily. 15 mL 0   levonorgestrel (MIRENA) 20 MCG/24HR IUD 1 each by Intrauterine route once.     polyethylene glycol (MIRALAX / GLYCOLAX) 17 g packet Take 17 g by mouth daily.     Semaglutide-Weight Management 2.4 MG/0.75ML SOAJ Inject 2.4 mg into the skin every 14 (fourteen)  days.     lansoprazole (PREVACID) 30 MG capsule Take 1 capsule (30 mg total) by mouth daily at 12 noon. 90 capsule 3   No facility-administered medications prior to visit.    Allergies  Allergen Reactions   Alpha-Gal Anaphylaxis, Nausea And Vomiting and Shortness Of Breath    ROS Review of Systems  Constitutional:  Positive for fatigue. Negative for chills and fever.  HENT:  Negative for congestion, sinus pressure, sinus pain and sore throat.   Eyes:  Negative for pain and discharge.  Respiratory:  Negative for cough and shortness of breath.   Cardiovascular:  Negative for chest pain and palpitations.  Gastrointestinal:  Positive for constipation. Negative for abdominal pain, nausea and vomiting.  Endocrine: Negative for polydipsia and polyuria.  Genitourinary:  Negative for dysuria and hematuria.  Musculoskeletal:  Positive for back pain, myalgias and neck pain. Negative for neck stiffness.  Skin:  Negative for rash.  Allergic/Immunologic: Positive for environmental allergies.  Neurological:  Negative for dizziness and weakness.  Psychiatric/Behavioral:  Negative for agitation and behavioral problems. The patient is nervous/anxious.       Objective:    Physical Exam Vitals reviewed.  Constitutional:      General: She is not in acute distress.    Appearance: She is not diaphoretic.  HENT:     Head: Normocephalic and atraumatic.     Nose: Nose normal.     Mouth/Throat:     Mouth: Mucous membranes are moist.  Eyes:     General: No scleral icterus.    Extraocular Movements: Extraocular movements intact.  Cardiovascular:     Rate and Rhythm: Normal rate and regular rhythm.     Pulses: Normal pulses.     Heart sounds: Normal heart sounds. No murmur heard. Pulmonary:     Breath sounds: Normal breath sounds. No wheezing or rales.  Musculoskeletal:     Right shoulder: Tenderness present.     Left shoulder: Tenderness present.     Cervical back: Neck supple. No tenderness.      Lumbar back: Tenderness present. Decreased range of motion.     Right lower leg: No edema.     Left lower leg: No edema.  Skin:    General: Skin is warm.     Findings: No rash.  Neurological:     General: No focal deficit present.     Mental Status: She is alert and oriented to person, place, and time.     Sensory: No sensory deficit.     Motor: No weakness.  Psychiatric:        Mood and Affect: Mood normal.        Behavior: Behavior normal.     BP 121/81 (BP Location: Right Arm, Patient Position: Sitting, Cuff Size: Normal)   Pulse 84   Ht 5\' 6"  (1.676 m)   Wt 149 lb 3.2 oz (67.7 kg)   SpO2 98%   BMI 24.08 kg/m  Wt Readings from Last 3 Encounters:  04/13/23 149 lb 3.2 oz (67.7 kg)  03/15/23 150 lb (68 kg)  03/02/23 153 lb 9.6 oz (69.7 kg)    Lab Results  Component Value Date   TSH 1.680 07/27/2022   Lab Results  Component Value Date   WBC 4.1 07/27/2022   HGB 14.8 07/27/2022   HCT 43.4 07/27/2022   MCV 89 07/27/2022   PLT 271 07/27/2022   Lab Results  Component Value Date   NA 140 07/27/2022   K 3.9 07/27/2022   CO2 24 07/27/2022   GLUCOSE 80 07/27/2022   BUN 9 07/27/2022   CREATININE 0.79 07/27/2022   BILITOT 0.5 07/27/2022   ALKPHOS 76 07/27/2022   AST 14 07/27/2022   ALT 16 07/27/2022   PROT 6.8 07/27/2022   ALBUMIN 4.3 07/27/2022   CALCIUM 9.2 07/27/2022   ANIONGAP 8 06/01/2022   EGFR 91 07/27/2022   Lab Results  Component Value Date   CHOL 161 07/27/2022   Lab Results  Component Value Date   HDL 35 (L) 07/27/2022   Lab Results  Component Value Date   LDLCALC 104 (H) 07/27/2022   Lab Results  Component Value Date   TRIG 120 07/27/2022   Lab Results  Component Value Date   CHOLHDL 4.6 (H) 07/27/2022   Lab Results  Component Value Date   HGBA1C 5.4 07/27/2022      Assessment & Plan:   Problem List Items Addressed This Visit       Digestive   Gastroesophageal reflux disease    Well controlled with Prevacid       Relevant Medications   lansoprazole (PREVACID) 30 MG capsule     Other   Generalized anxiety disorder (Chronic)    Well-controlled with Xanax 1 mg BID PRN and Celexa Had switched to Cymbalta for benefit with fibromyalgia, but did not tolerate it well      Fibromyalgia - Primary (Chronic)    Had Rheumatology evaluation in 2019, was told of fibromyalgia, but did not start any treatment at that time Had started Cymbalta, but did not tolerate it If persistent symptoms or insomnia, may switch to Elavil - she prefers to stay with Celexa Was referred to Rheumatology, but they do not manage fibromyalgia      Obesity    BMI Readings from Last 3 Encounters:  04/13/23 24.08 kg/m  03/15/23 24.21 kg/m  03/02/23 24.79 kg/m   Advised to follow low-carb diet and perform moderate exercise at least 150 minutes/week Has prediabetes, HLD, HTN and GERD as well She is a good candidate for the GLP-1 agonist - was on Wegovy - initial BMI - 34.31 She is on semaglutide formulation through [facility], needs to continue low carb diet  Other Visit Diagnoses     Encounter for immunization       Relevant Orders   Varicella-zoster vaccine IM (Completed)        Meds ordered this encounter  Medications   lansoprazole (PREVACID) 30 MG capsule    Sig: Take 1 capsule (30 mg total) by mouth daily at 12 noon.    Dispense:  90 capsule    Refill:  3    Follow-up: Return in about 4 months (around 08/13/2023) for Annual physical.    Anabel Halon, MD

## 2023-04-13 NOTE — Patient Instructions (Signed)
Please continue to take medications as prescribed. ? ?Please continue to follow low carb diet and perform moderate exercise/walking at least 150 mins/week. ?

## 2023-04-13 NOTE — Assessment & Plan Note (Addendum)
Had Rheumatology evaluation in 2019, was told of fibromyalgia, but did not start any treatment at that time Had started Cymbalta, but did not tolerate it If persistent symptoms or insomnia, may switch to Elavil - she prefers to stay with Celexa Was referred to Rheumatology, but they do not manage fibromyalgia

## 2023-04-13 NOTE — Assessment & Plan Note (Signed)
Well controlled with Prevacid 

## 2023-04-15 DIAGNOSIS — M5135 Other intervertebral disc degeneration, thoracolumbar region: Secondary | ICD-10-CM | POA: Diagnosis not present

## 2023-04-19 ENCOUNTER — Encounter: Payer: Self-pay | Admitting: General Surgery

## 2023-04-19 ENCOUNTER — Ambulatory Visit: Payer: 59 | Admitting: General Surgery

## 2023-04-19 VITALS — BP 133/82 | HR 69 | Temp 97.8°F | Resp 12 | Ht 66.0 in | Wt 153.0 lb

## 2023-04-19 DIAGNOSIS — K641 Second degree hemorrhoids: Secondary | ICD-10-CM | POA: Diagnosis not present

## 2023-04-19 DIAGNOSIS — K644 Residual hemorrhoidal skin tags: Secondary | ICD-10-CM

## 2023-04-19 NOTE — Patient Instructions (Signed)
Hemorrhoids Hemorrhoids are swollen veins in and around the rectum or the opening of the butt (anus). There are two types of hemorrhoids: Internal. These occur in the veins just inside the rectum. They may poke through to the outside and become irritated and painful. External. These occur in the veins outside the anus. They can be felt as a painful swelling or hard lump near the anus. Most hemorrhoids do not cause severe problems. Often, they can be treated at home with diet and lifestyle changes. If home treatments do not help, you may need a procedure to shrink or remove the hemorrhoids. What are the causes? Hemorrhoids are caused by pressure near the anus. This pressure may be caused by: Constipation or diarrhea. Straining to poop. Pregnancy. Obesity. Sitting or riding a bike for a long time. Heavy lifting or other things that cause you to strain. Anal sex. What are the signs or symptoms? Symptoms of this condition include: Pain. Anal itching or irritation. Bleeding from the rectum. Leakage of poop (stool). Swelling of the anus. One or more lumps around the anus. How is this diagnosed? Hemorrhoids can often be diagnosed through a visual exam. Other exams or tests may also be done, such as: A digital rectal exam. This is when your health care provider feels inside your rectum with a gloved finger. Anoscope. This is an exam of the anus using a small tube. A blood test, if you have lost a lot of blood. A sigmoidoscopy or colonoscopy. These are tests to look inside the colon using a tube with a camera on the end. How is this treated? In most cases, hemorrhoids can be treated at home with diet and lifestyle changes. If these changes do not help, you may need to have a procedure done. These procedures can make the hemorrhoids smaller or fully remove them. Common procedures include: Rubber band ligation. Rubber bands are placed at the base of the hemorrhoids to cut off their blood  supply. Sclerotherapy. Medicine is put into the hemorrhoids to shrink them. Infrared coagulation. A type of light energy is used to get rid of the hemorrhoids. Hemorrhoidectomy surgery. The hemorrhoids are removed during surgery. Then, the veins that supply them are tied off. Stapled hemorrhoidopexy surgery. The base of the hemorrhoid is stapled to the wall of the rectum. Follow these instructions at home: Medicines Take over-the-counter and prescription medicines only as told by your provider. Use medicated creams or medicines that are put in the rectum (suppositories) as told by your provider. Eating and drinking  Eat foods that are high in fiber, such as beans, whole grains, and fresh fruits and vegetables. Ask your provider about taking products that have fiber added to them (fiber supplements). Reduce the amount of fat in your diet. You can do this by eating low-fat dairy products, eating less red meat, and avoiding processed foods. Drink enough fluid to keep your pee (urine) pale yellow. Managing pain and swelling  Take warm sitz baths for 20 minutes, 3-4 times a day. This can help ease pain and discomfort. You may do this in a bathtub or you can use a portable sitz bath that fits over the toilet. If told, put ice on the affected area. It may help to use ice packs between sitz baths. Put ice in a plastic bag. Place a towel between your skin and the bag. Leave the ice on for 20 minutes, 2-3 times a day. If your skin turns bright red, remove the ice right away to prevent   skin damage. The risk of damage is higher if you cannot feel pain, heat, or cold. General instructions Exercise. Ask your provider how much and what kind of exercise is best for you. In general, you should do moderate exercise for at least 30 minutes on most days of the week (150 minutes each week). You may want to try walking, biking, or yoga. Go to the bathroom when you have the urge to poop. Do not wait. Avoid  straining to poop. Keep the anus dry and clean. Use wet toilet paper or moist towelettes after you poop. Do not sit on the toilet for a long time. This can increase blood pooling and pain. Where to find more information National Institute of Diabetes and Digestive and Kidney Diseases: niddk.nih.gov Contact a health care provider if: You have more pain and swelling that do not get better with treatment. You have trouble pooping or you are not able to poop. You have pain or inflammation outside the area of the hemorrhoids. Get help right away if: You are bleeding from your rectum and you cannot get it to stop. This information is not intended to replace advice given to you by your health care provider. Make sure you discuss any questions you have with your health care provider. Document Revised: 04/12/2022 Document Reviewed: 04/12/2022 Elsevier Patient Education  2024 Elsevier Inc.  

## 2023-04-19 NOTE — Progress Notes (Signed)
[facility] History and Physical  Reason for Referral: Hemorrhoids  Referring Physician: Dr. Marletta Lor   Chief Complaint   New Patient (Initial Visit)     Theresa [name] is a 52 y.o. female.  HPI: Theresa [name] is coming in with external hemorrhoids 6 months and banding done 6 years ago. She has no pain with the hemorrhoids and no bleeding. She had a colonoscopy a few weeks ago which showed some small hemorrhoids. She is here to discuss her hemorrhoids and what her options are for the hemorrhoids.  She had a "knot" area in the left anterior region for a period of a week that caused pain and has since resolved.   Past Medical History:  Diagnosis Date   Allergy    Allergy to alpha-gal    Anxiety    Colon adenoma DEC 2014   ONE(7 MM) SIMPLE([place])   Concussion with loss of consciousness 02/17/2019   Depression    Edema    GERD (gastroesophageal reflux disease)    History of abnormal cervical Pap smear 12/02/2013   History of nephrolithiasis    HPV (human papilloma virus) infection    Hyperlipidemia    IBS (irritable bowel syndrome)    constipation   Insomnia    Lymphadenopathy 07/12/2020   Obesity 01/14/2014   Positive test for Epstein-Barr virus (EBV) 08/21/2017   Pre-diabetes 09/2014   Superficial basal cell carcinoma (BCC) 12/26/2019   Right Breast- treatment Imiquimoid   Yeast infection 09/04/2013    Past Surgical History:  Procedure Laterality Date   BIOPSY N/A 08/01/2013   Procedure: BIOPSY;  Surgeon: West Bali, MD;  [facility];  Service: Endoscopy;  Laterality: N/A;  FOR MICROSCOPIC COLITIS   BIOPSY  08/09/2018   Procedure: BIOPSY;  Surgeon: Malissa Hippo, MD;  [facility];  Service: Endoscopy;;  gastric   BREAST REDUCTION SURGERY  2009   BREAST SURGERY  2007   breast reduction   CHOLECYSTECTOMY N/A 11/04/2014   Procedure: LAPAROSCOPIC CHOLECYSTECTOMY;  Surgeon: Franky Macho Md, MD;  Location: [facility];  Service: General;  Laterality: N/A;    COLONOSCOPY  Oct 2009   Dr. Leone Payor: int/ext hemorrhoids, ?mild proctitis but path benign, likely r/t irritation from bowel prep   COLONOSCOPY N/A 08/01/2013   Procedure: COLONOSCOPY;  Surgeon: West Bali, MD;  [facility];  Service: Endoscopy;  Laterality: N/A;  12:00   COLONOSCOPY WITH PROPOFOL N/A 08/09/2018   Procedure: COLONOSCOPY WITH PROPOFOL;  Surgeon: Malissa Hippo, MD;  [facility];  Service: Endoscopy;  Laterality: N/A;  730   COLONOSCOPY WITH PROPOFOL N/A 03/15/2023   Procedure: COLONOSCOPY WITH PROPOFOL;  Surgeon: Lanelle Bal, DO;  [facility];  Service: Endoscopy;  Laterality: N/A;  10:30 am, asa 2   ESOPHAGOGASTRODUODENOSCOPY  Dec 2009   Dr. Leone Payor: reflux esophagitis, proximal gastric polyps, benign   ESOPHAGOGASTRODUODENOSCOPY N/A 12/18/2014   Procedure: ESOPHAGOGASTRODUODENOSCOPY (EGD);  Surgeon: Malissa Hippo, MD;  [facility];  Service: Endoscopy;  Laterality: N/A;  230   ESOPHAGOGASTRODUODENOSCOPY (EGD) WITH PROPOFOL N/A 08/09/2018   Procedure: ESOPHAGOGASTRODUODENOSCOPY (EGD) WITH PROPOFOL;  Surgeon: Malissa Hippo, MD;  [facility];  Service: Endoscopy;  Laterality: N/A;   ESOPHAGOGASTRODUODENOSCOPY (EGD) WITH PROPOFOL N/A 03/15/2023   Procedure: ESOPHAGOGASTRODUODENOSCOPY (EGD) WITH PROPOFOL;  Surgeon: Lanelle Bal, DO;  [facility];  Service: Endoscopy;  Laterality: N/A;   HEMORRHOID BANDING N/A 08/01/2013   Procedure: HEMORRHOID BANDING;  Surgeon: West Bali, MD;  [facility];  Service: Endoscopy;  Laterality: N/A;  internal hemorrhoid banding   LIVER BIOPSY N/A 11/04/2014   Procedure: LIVER BIOPSY;  Surgeon: Franky Macho Md, MD;  Location: [facility];  Service: General;  Laterality: N/A;   POLYPECTOMY  2015   Dr.Fields   POLYPECTOMY  08/09/2018   Procedure: POLYPECTOMY;  Surgeon: Malissa Hippo, MD;  [facility];  Service: Endoscopy;;  colon    POLYPECTOMY  03/15/2023   Procedure: POLYPECTOMY;  Surgeon: Lanelle Bal, DO;  [facility];  Service: Endoscopy;;  gastric/ascending colon   REDUCTION MAMMAPLASTY Bilateral 2006   UPPER GASTROINTESTINAL ENDOSCOPY  2006   Dr. Clydene Pugh in [place]    Family History  Problem Relation Age of Onset   Cancer Mother        head and neck   Hypertension Father    Hyperlipidemia Father    Pancreatic cancer Maternal Grandfather    Colon cancer Neg Hx     Social History   Tobacco Use   Smoking status: Never   Smokeless tobacco: Never   Tobacco comments:    Never smoker  Vaping Use   Vaping status: Never Used  Substance Use Topics   Alcohol use: Yes    Alcohol/week: 0.0 standard drinks of alcohol    Comment: socially   Drug use: No    Medications: I have reviewed the patient's current medications. Allergies as of 04/19/2023       Reactions   Alpha-gal Anaphylaxis, Nausea And Vomiting, Shortness Of Breath        Medication List        Accurate as of April 19, 2023  2:42 PM. If you have any questions, ask your nurse or doctor.          ALPRAZolam 1 MG tablet Commonly known as: XANAX Take 1 tablet (1 mg) by mouth 2 times daily as needed for anxiety.   citalopram 40 MG tablet Commonly known as: CELEXA Take 1 tablet (40 mg total) by mouth daily.   EPINEPHrine 0.3 mg/0.3 mL Soaj injection Commonly known as: EpiPen 2-Pak Inject 0.3 mg into the muscle as needed for anaphylaxis.   fluticasone 50 MCG/ACT nasal spray Commonly known as: FLONASE Place 1 spray into both nostrils 2 (two) times daily as needed for allergies or rhinitis   ipratropium 0.06 % nasal spray Commonly known as: ATROVENT Place 2 sprays into both nostrils 3 (three) times daily.   lansoprazole 30 MG capsule Commonly known as: PREVACID Take 1 capsule (30 mg total) by mouth daily at 12 noon.   levonorgestrel 20 MCG/24HR IUD Commonly known as: MIRENA 1 each by Intrauterine route  once.   polyethylene glycol 17 g packet Commonly known as: MIRALAX / GLYCOLAX Take 17 g by mouth daily.   Semaglutide-Weight Management 2.4 MG/0.75ML Soaj Inject 2.4 mg into the skin every 14 (fourteen) days.         ROS:  A comprehensive review of systems was negative except for: Gastrointestinal: positive for reflux symptoms and hemorrhoids  Blood pressure 133/82, pulse 69, temperature 97.8 F (36.6 C), temperature source Oral, resp. rate 12, height 5\' 6"  (1.676 m), weight 153 lb (69.4 kg), SpO2 99%. Physical Exam Vitals reviewed.  Constitutional:      Appearance: Normal appearance.  HENT:     Head: Normocephalic.     Nose: Nose normal.     Mouth/Throat:     Mouth: Mucous membranes are  moist.  Eyes:     Extraocular Movements: Extraocular movements intact.  Cardiovascular:     Rate and Rhythm: Normal rate and regular rhythm.  Pulmonary:     Effort: Pulmonary effort is normal.     Breath sounds: Normal breath sounds.  Abdominal:     General: There is no distension.     Palpations: Abdomen is soft.     Tenderness: There is no abdominal tenderness.  Genitourinary:    Rectum: External hemorrhoid and internal hemorrhoid present.     Comments: Small external tags, some internal bulging with valsalva that spontaneously reduces  Musculoskeletal:        General: No swelling.     Cervical back: Normal range of motion.  Skin:    General: Skin is warm.  Neurological:     General: No focal deficit present.     Mental Status: She is alert.  Psychiatric:        Mood and Affect: Mood normal.        Behavior: Behavior normal.     Results: None   Assessment & Plan:  Andilyn Roccia is a 52 y.o. female with grade II hemorrhoids and external tags with no obvious knot or sign of anything concerning. She likely had a thrombosed portion of that hemorrhoid column that caused knot and pain.   Hemorrhoid surgery for external hemorrhoids is very painful. The pain and discomfort that  the patient is having currently will be magnified after the surgery for at least 2-3 weeks.  The patient will have feelings of constant pressure and pain in the area from the swelling and removal of the anoderm (skin around the anus). The internal hemorrhoids are not painful to remove because the same nerves are not involved, and the sensation is different, but removal of any external hemorrhoids will cause significant discomfort. They will need at least 4-6 weeks to recover from the surgery, and should not expect to be able to feel back to "normal for 6-8 weeks."    The risk of hemorrhoid surgery include bleeding, risk of infection although rare, and the risk of narrowing the anal canal if too much tissue is removed. Given this risk, it is likely that only the 2 largest hemorrhoid columns would be removed during the initial surgery.  We have also discussed the risk of incontinence after surgery if the muscles were injured, and although this is rare that it can happen and is another reason to limit the amount of hemorrhoids removed.     All questions were answered to the satisfaction of the patient.   Lucretia Roers 04/19/2023, 2:42 PM

## 2023-04-26 ENCOUNTER — Other Ambulatory Visit: Payer: Self-pay | Admitting: Internal Medicine

## 2023-04-26 ENCOUNTER — Encounter: Payer: Self-pay | Admitting: Internal Medicine

## 2023-04-26 ENCOUNTER — Other Ambulatory Visit (HOSPITAL_COMMUNITY): Payer: Self-pay

## 2023-04-26 DIAGNOSIS — B351 Tinea unguium: Secondary | ICD-10-CM | POA: Insufficient documentation

## 2023-04-26 MED ORDER — TERBINAFINE HCL 250 MG PO TABS
250.0000 mg | ORAL_TABLET | Freq: Every day | ORAL | 0 refills | Status: DC
  Filled 2023-04-26: qty 84, 84d supply, fill #0

## 2023-04-26 MED ORDER — TERBINAFINE HCL 250 MG PO TABS
250.0000 mg | ORAL_TABLET | Freq: Every day | ORAL | 0 refills | Status: DC
Start: 2023-04-26 — End: 2023-06-22

## 2023-04-26 NOTE — Addendum Note (Signed)
Addended byTrena Platt on: 04/26/2023 05:20 PM   Modules accepted: Orders

## 2023-04-27 ENCOUNTER — Other Ambulatory Visit (HOSPITAL_COMMUNITY): Payer: Self-pay

## 2023-05-02 ENCOUNTER — Other Ambulatory Visit: Payer: Self-pay | Admitting: Internal Medicine

## 2023-05-02 ENCOUNTER — Encounter: Payer: Self-pay | Admitting: Internal Medicine

## 2023-05-02 DIAGNOSIS — J0111 Acute recurrent frontal sinusitis: Secondary | ICD-10-CM

## 2023-05-02 MED ORDER — AMOXICILLIN-POT CLAVULANATE 875-125 MG PO TABS: 1.0000 | ORAL_TABLET | Freq: Two times a day (BID) | ORAL | 0 refills | Status: DC

## 2023-05-28 ENCOUNTER — Ambulatory Visit (INDEPENDENT_AMBULATORY_CARE_PROVIDER_SITE_OTHER): Payer: No Typology Code available for payment source | Admitting: Gastroenterology

## 2023-06-01 ENCOUNTER — Other Ambulatory Visit: Payer: Self-pay

## 2023-06-01 ENCOUNTER — Other Ambulatory Visit (HOSPITAL_COMMUNITY): Payer: Self-pay

## 2023-06-04 ENCOUNTER — Other Ambulatory Visit: Payer: Self-pay

## 2023-06-04 ENCOUNTER — Encounter (HOSPITAL_COMMUNITY): Payer: Self-pay | Admitting: *Deleted

## 2023-06-04 ENCOUNTER — Emergency Department (HOSPITAL_COMMUNITY): Payer: 59

## 2023-06-04 ENCOUNTER — Emergency Department (HOSPITAL_COMMUNITY)
Admission: EM | Admit: 2023-06-04 | Discharge: 2023-06-04 | Disposition: A | Discharge status: Home / Self Care | Payer: 59 | Attending: Emergency Medicine | Admitting: Emergency Medicine

## 2023-06-04 DIAGNOSIS — S0990XA Unspecified injury of head, initial encounter: Secondary | ICD-10-CM

## 2023-06-04 DIAGNOSIS — S069X9A Unspecified intracranial injury with loss of consciousness of unspecified duration, initial encounter: Secondary | ICD-10-CM | POA: Diagnosis not present

## 2023-06-04 DIAGNOSIS — W01198A Fall on same level from slipping, tripping and stumbling with subsequent striking against other object, initial encounter: Secondary | ICD-10-CM | POA: Diagnosis not present

## 2023-06-04 DIAGNOSIS — S060XAA Concussion with loss of consciousness status unknown, initial encounter: Secondary | ICD-10-CM | POA: Insufficient documentation

## 2023-06-04 DIAGNOSIS — M542 Cervicalgia: Secondary | ICD-10-CM | POA: Diagnosis not present

## 2023-06-04 DIAGNOSIS — W19XXXA Unspecified fall, initial encounter: Secondary | ICD-10-CM

## 2023-06-04 DIAGNOSIS — S199XXA Unspecified injury of neck, initial encounter: Secondary | ICD-10-CM | POA: Diagnosis not present

## 2023-06-04 DIAGNOSIS — H547 Unspecified visual loss: Secondary | ICD-10-CM | POA: Diagnosis not present

## 2023-06-04 DIAGNOSIS — Z794 Long term (current) use of insulin: Secondary | ICD-10-CM | POA: Diagnosis not present

## 2023-06-04 DIAGNOSIS — S060X9A Concussion with loss of consciousness of unspecified duration, initial encounter: Secondary | ICD-10-CM | POA: Diagnosis not present

## 2023-06-04 LAB — POC URINE PREG, ED: Preg Test, Ur: NEGATIVE

## 2023-06-04 MED ORDER — KETOROLAC TROMETHAMINE 15 MG/ML IJ SOLN
15.0000 mg | Freq: Once | INTRAMUSCULAR | Status: AC
  Administered 2023-06-04: 15 mg via INTRAVENOUS

## 2023-06-04 MED ORDER — DEXAMETHASONE 4 MG PO TABS
10.0000 mg | ORAL_TABLET | ORAL | Status: AC
  Administered 2023-06-04: 10 mg via ORAL
  Filled 2023-06-04: qty 3

## 2023-06-04 MED ORDER — ACETAMINOPHEN 500 MG PO TABS
1000.0000 mg | ORAL_TABLET | Freq: Once | ORAL | Status: AC
  Administered 2023-06-04: 1000 mg via ORAL
  Filled 2023-06-04: qty 2

## 2023-06-04 MED ORDER — KETOROLAC TROMETHAMINE 15 MG/ML IJ SOLN
15.0000 mg | Freq: Once | INTRAMUSCULAR | Status: DC
  Filled 2023-06-04: qty 1

## 2023-06-04 MED ORDER — DIPHENHYDRAMINE HCL 50 MG/ML IJ SOLN
25.0000 mg | Freq: Once | INTRAMUSCULAR | Status: AC
  Administered 2023-06-04: 25 mg via INTRAVENOUS

## 2023-06-04 MED ORDER — METHOCARBAMOL 500 MG PO TABS: 500.0000 mg | ORAL_TABLET | Freq: Two times a day (BID) | ORAL | 0 refills | Status: DC

## 2023-06-04 MED ORDER — METOCLOPRAMIDE HCL 5 MG/ML IJ SOLN
10.0000 mg | Freq: Once | INTRAMUSCULAR | Status: DC
  Filled 2023-06-04: qty 2

## 2023-06-04 MED ORDER — METOCLOPRAMIDE HCL 5 MG/ML IJ SOLN
10.0000 mg | Freq: Once | INTRAMUSCULAR | Status: AC
  Administered 2023-06-04: 10 mg via INTRAVENOUS

## 2023-06-04 MED ORDER — DIPHENHYDRAMINE HCL 50 MG/ML IJ SOLN
25.0000 mg | Freq: Once | INTRAMUSCULAR | Status: DC
  Filled 2023-06-04: qty 1

## 2023-06-04 NOTE — Discharge Instructions (Addendum)
You were seen in the ER today for evaluation after your head injury. Your CT scans were unremarkable.  I am glad that your pain is resolving.  You likely have the symptoms for the next few days as your brain recovers.  I recommend taking 1000 mg of Tylenol and/or 600 mg of ibuprofen every 6 hours as needed for pain.  I will also send you in a muscle relaxer as well.  You can take the muscle laxer as needed.  Please do not drive or operate any heavy machinery while on this medication as it will make you sleepy.  Please make sure you follow-up with your primary care doctor within the next week.  I have included information for the [facility] as they have a concussion clinic that she can follow-up with.  Please make sure you call to schedule an appointment.  The best thing for this is a brain rest which means put yourself in a dark room with no lights such as TVs or phones.  Needs to be a doctor in private/dark room to allow for brain rest to allow brain healing. If you have any concerns, new or worsening symptoms, please return to the nearest ER for re-evaluation.   Contact a doctor if: These symptoms do not go away: Headaches. Dizziness. Double vision or vision changes. Trouble sleeping. Changes in mood. You have new symptoms. Get help right away if: You have sudden: Headache that is very bad. Vomiting that does not stop. Changes in the size of one of your pupils. Pupils are the black centers of your eyes. Changes in how you see (vision). More confusion or more grumpy moods. You have a seizure. Your symptoms get worse. You have a clear or bloody fluid coming from your nose or ears. These symptoms may be an emergency. Get help right away. Call 911. Do not wait to see if the symptoms will go away. Do not drive yourself to the hospital.

## 2023-06-04 NOTE — ED Provider Notes (Signed)
[place] EMERGENCY DEPARTMENT AT [facility] Provider Note   CSN: [id number] Arrival date & time: 06/04/23  5621     History Chief Complaint  Patient presents with   Marletta Lor    Theresa [name] is a 52 y.o. female with h/o FM presents to the ER for evaluation after head injury on Saturday. The patient reports that she slipped backwards on wet grass and hit her head on the concrete.  She reports that she did not lose any consciousness but had to be helped up.  She reports that she is ambulatory with some assistance and they sat her on the couch.  She reports that she realizes people were talking to her however she could not see them but reports that she did not know if her eyes were open or not.  She was consuming alcohol that day and reports she had around 6 beers.  She reports that she was given a blanket and she laid down to take a nap.  She reports that she woke up multiple times the night to pee and did not have any visual problems then but did have a headache.  She reports that mainly her headache is on the left side however she reports she hit the back of her head.  She is not on any blood thinners.  She has not tried any medications for pain.  She feels more lightheaded than she does room spinning sensation.  He denies any chest pain or shortness of breath.  She reports some paraspinal neck pain.  She reports that she has had a concussion before and was around 8 years ago.  She denies any trouble talking.  She reports her vision is at her baseline now.  She denies any other injury.  Reports her neck pain is mainly on the sides.  She does take Xanax, Celexa, and Prevacid.  No known drug allergies.  Denies any tobacco or drug use.   Fall Associated symptoms include headaches. Pertinent negatives include no chest pain, no abdominal pain and no shortness of breath.       Home Medications Prior to Admission medications   Medication Sig Start Date End Date Taking? Authorizing Provider   ALPRAZolam Prudy Feeler) 1 MG tablet Take 1 tablet (1 mg) by mouth 2 times daily as needed for anxiety. 01/30/23   Anabel Halon, MD  amoxicillin-clavulanate (AUGMENTIN) 875-125 MG tablet Take 1 tablet by mouth 2 (two) times daily. 05/02/23   Anabel Halon, MD  citalopram (CELEXA) 40 MG tablet Take 1 tablet (40 mg total) by mouth daily. 01/30/23   Anabel Halon, MD  EPINEPHrine (EPIPEN 2-PAK) 0.3 mg/0.3 mL IJ SOAJ injection Inject 0.3 mg into the muscle as needed for anaphylaxis. 07/19/22   Hetty Blend, FNP  fluticasone (FLONASE) 50 MCG/ACT nasal spray Place 1 spray into both nostrils 2 (two) times daily as needed for allergies or rhinitis 07/19/22   Ambs, Norvel Richards, FNP  ipratropium (ATROVENT) 0.06 % nasal spray Place 2 sprays into both nostrils 3 (three) times daily. 04/04/22   Alfonse Spruce, MD  lansoprazole (PREVACID) 30 MG capsule Take 1 capsule (30 mg total) by mouth daily at 12 noon. 04/13/23   Anabel Halon, MD  levonorgestrel (MIRENA) 20 MCG/24HR IUD 1 each by Intrauterine route once.    [provider]  polyethylene glycol (MIRALAX / GLYCOLAX) 17 g packet Take 17 g by mouth daily.    [provider]  Semaglutide-Weight Management 2.4 MG/0.75ML SOAJ Inject  2.4 mg into the skin every 14 (fourteen) days. 01/19/23   [provider]  terbinafine (LAMISIL) 250 MG tablet Take 1 tablet (250 mg total) by mouth daily. 04/26/23   Anabel Halon, MD      Allergies    Alpha-gal    Review of Systems   Review of Systems  Constitutional:  Negative for chills and fever.  Respiratory:  Negative for shortness of breath.   Cardiovascular:  Negative for chest pain.  Gastrointestinal:  Negative for abdominal pain.  Genitourinary:  Negative for dysuria and hematuria.  Musculoskeletal:  Negative for gait problem and neck stiffness.  Neurological:  Positive for light-headedness and headaches. Negative for dizziness, facial asymmetry, speech difficulty, weakness and numbness.     Physical Exam Updated Vital Signs BP 119/76 (BP Location: Right Arm)   Pulse 64   Temp 98 F (36.7 C)   Resp 18   Ht 5\' 6"  (1.676 m)   Wt 72.6 kg   SpO2 98%   BMI 25.82 kg/m  Physical Exam Vitals and nursing note reviewed.  Constitutional:      Appearance: She is not toxic-appearing.     Comments: Uncomfortable, but not toxic appearing  HENT:     Head: Normocephalic and atraumatic.     Comments: No raccoon eyes or battle signs.    Right Ear: Tympanic membrane, ear canal and external ear normal.     Left Ear: Tympanic membrane, ear canal and external ear normal.     Mouth/Throat:     Mouth: Mucous membranes are moist.  Eyes:     General: No scleral icterus.    Extraocular Movements: Extraocular movements intact.     Pupils: Pupils are equal, round, and reactive to light.  Neck:     Comments: No midline tenderness palpation.  Some bilateral paraspinal cervical tenderness palpation.  No step-offs or deformities.  No overlying skin changes noted.  No palpable masses.  She has full range of motion however does have some pain with doing so. Cardiovascular:     Pulses: Normal pulses.  Pulmonary:     Effort: Pulmonary effort is normal. No respiratory distress.  Musculoskeletal:     Cervical back: Normal range of motion.  Skin:    General: Skin is warm and dry.  Neurological:     General: No focal deficit present.     Mental Status: She is alert. Mental status is at baseline.     GCS: GCS eye subscore is 4. GCS verbal subscore is 5. GCS motor subscore is 6.     Cranial Nerves: No cranial nerve deficit, dysarthria or facial asymmetry.     Sensory: No sensory deficit.     Motor: No weakness or pronator drift.     Coordination: Finger-Nose-Finger Test and Heel to [place] Test normal.     Gait: Gait is intact.     ED Results / Procedures / Treatments   Labs (all labs ordered are listed, but only abnormal results are displayed) Labs Reviewed  POC URINE PREG, ED     EKG None  Radiology CT Head Wo Contrast  Result Date: 06/04/2023 CLINICAL DATA:  Slipped and fell 2 days ago, loss of consciousness and loss of vision EXAM: CT HEAD WITHOUT CONTRAST CT CERVICAL SPINE WITHOUT CONTRAST TECHNIQUE: Multidetector CT imaging of the head and cervical spine was performed following the standard protocol without intravenous contrast. Multiplanar CT image reconstructions of the cervical spine were also generated. RADIATION DOSE REDUCTION: This exam was performed  according to the departmental dose-optimization program which includes automated exposure control, adjustment of the mA and/or kV according to patient size and/or use of iterative reconstruction technique. COMPARISON:  Cervical spine radiographs 04/17/2017, CT head 02/26/2019 FINDINGS: CT HEAD FINDINGS Brain: There is no acute intracranial hemorrhage, extra-axial fluid collection, or acute infarct. Parenchymal volume is normal. The ventricles are normal in size. Gray-white differentiation is preserved The pituitary and suprasellar region are normal. There is no mass lesion. There is no mass effect or midline shift. Vascular: No hyperdense vessel or unexpected calcification. Skull: Normal. Negative for fracture or focal lesion. Sinuses/Orbits: The imaged paranasal sinuses are clear. The globes and orbits are unremarkable. Other: The mastoid air cells and middle ear cavities are clear. CT CERVICAL SPINE FINDINGS Alignment: Normal.  There is no evidence of traumatic malalignment. Skull base and vertebrae: Skull base alignment is maintained. Vertebral body heights are preserved. There is no evidence of acute fracture. There is no suspicious osseous lesion. Soft tissues and spinal canal: No prevertebral fluid or swelling. No visible canal hematoma. Disc levels: The disc heights are overall preserved with minimal degenerative endplate change at C3-C4 and C4-C5. There is no significant spinal canal or neural foraminal stenosis.  Upper chest: The imaged lung apices are clear. Other: None. IMPRESSION: 1. No acute intracranial pathology. 2. No acute fracture or traumatic malalignment of the cervical spine. Electronically Signed   By: Lesia Hausen M.D.   On: 06/04/2023 09:32   CT Cervical Spine Wo Contrast  Result Date: 06/04/2023 CLINICAL DATA:  Slipped and fell 2 days ago, loss of consciousness and loss of vision EXAM: CT HEAD WITHOUT CONTRAST CT CERVICAL SPINE WITHOUT CONTRAST TECHNIQUE: Multidetector CT imaging of the head and cervical spine was performed following the standard protocol without intravenous contrast. Multiplanar CT image reconstructions of the cervical spine were also generated. RADIATION DOSE REDUCTION: This exam was performed according to the departmental dose-optimization program which includes automated exposure control, adjustment of the mA and/or kV according to patient size and/or use of iterative reconstruction technique. COMPARISON:  Cervical spine radiographs 04/17/2017, CT head 02/26/2019 FINDINGS: CT HEAD FINDINGS Brain: There is no acute intracranial hemorrhage, extra-axial fluid collection, or acute infarct. Parenchymal volume is normal. The ventricles are normal in size. Gray-white differentiation is preserved The pituitary and suprasellar region are normal. There is no mass lesion. There is no mass effect or midline shift. Vascular: No hyperdense vessel or unexpected calcification. Skull: Normal. Negative for fracture or focal lesion. Sinuses/Orbits: The imaged paranasal sinuses are clear. The globes and orbits are unremarkable. Other: The mastoid air cells and middle ear cavities are clear. CT CERVICAL SPINE FINDINGS Alignment: Normal.  There is no evidence of traumatic malalignment. Skull base and vertebrae: Skull base alignment is maintained. Vertebral body heights are preserved. There is no evidence of acute fracture. There is no suspicious osseous lesion. Soft tissues and spinal canal: No  prevertebral fluid or swelling. No visible canal hematoma. Disc levels: The disc heights are overall preserved with minimal degenerative endplate change at C3-C4 and C4-C5. There is no significant spinal canal or neural foraminal stenosis. Upper chest: The imaged lung apices are clear. Other: None. IMPRESSION: 1. No acute intracranial pathology. 2. No acute fracture or traumatic malalignment of the cervical spine. Electronically Signed   By: Lesia Hausen M.D.   On: 06/04/2023 09:32    Procedures Procedures    Medications Ordered in ED Medications  dexamethasone (DECADRON) tablet 10 mg (10 mg Oral Given 06/04/23 0940)  acetaminophen (TYLENOL) tablet 1,000 mg (1,000 mg Oral Given 06/04/23 0941)  metoCLOPramide (REGLAN) injection 10 mg (10 mg Intravenous Given 06/04/23 0948)  ketorolac (TORADOL) 15 MG/ML injection 15 mg (15 mg Intravenous Given 06/04/23 0948)  diphenhydrAMINE (BENADRYL) injection 25 mg (25 mg Intravenous Given 06/04/23 8657)    ED Course/ Medical Decision Making/ A&P                                 Medical Decision Making Amount and/or Complexity of Data Reviewed Radiology: ordered.  Risk OTC drugs. Prescription drug management.    52 y.o. female presents to the ER for evaluation of headaches after head injury. Differential diagnosis includes but is not limited to trauma, concussion. Vital signs unremarkable. Physical exam as noted above.   Benign neurological exam.  No battle signs or raccoon eyes.  She is answering questions appropriately with appropriate speech.  Will obtain CT imaging and proceed with migraine cocktail.  I independently reviewed and interpreted the patient's labs. Pregnancy test is negative.  CT imaging of the head and neck per radiologists interpretation: 1. No acute intracranial pathology. 2. No acute fracture or traumatic malalignment of the cervical spine.   Patient was given Decadron, Tylenol, Reglan, Toradol, Benadryl.  Will  reassess.  On reevaluation, patient reports that her headache is improved greatly however she still feels a little dazed.  Could be from the medication as well as I think she also has concussion symptoms.  She has a benign neurological exam however.  She is no focal deficits.  Nursing reports she has been to the bathroom independently with a steady gait. CT imaging is unremarkable per radiologist read.  I will give her follow-up with [place] sports medicine clinic as well as strict return precautions.  We discussed brain rest with her and her husband in the room.  Discussed red flag symptoms as well.  They verbalized their understanding.  Will discharge her home with Tylenol ibuprofen and muscle relaxers.  We discussed the results of the labs/imaging. The plan is follow-up with our sports medicine clinic, brain rest, follow-up PCP. We discussed strict return precautions and red flag symptoms. The patient verbalized their understanding and agrees to the plan. The patient is stable and being discharged home in good condition.  Portions of this report may have been transcribed using voice recognition software. Every effort was made to ensure accuracy; however, inadvertent computerized transcription errors may be present.   Final Clinical Impression(s) / ED Diagnoses Final diagnoses:  Fall, initial encounter  Injury of head, initial encounter  Concussion with unknown loss of consciousness status, initial encounter    Rx / DC Orders ED Discharge Orders     None         Achille Rich, PA-C 06/04/23 1149    Pricilla Loveless, MD 06/07/23 [phone]

## 2023-06-04 NOTE — ED Notes (Signed)
PA-C at bedside 

## 2023-06-04 NOTE — ED Notes (Signed)
Patient transported to CT 

## 2023-06-04 NOTE — ED Notes (Addendum)
Pt ambulated to the BR without assistance, standby assistance followed closely behind for safety, given urine specimen cup for urine sample

## 2023-06-04 NOTE — ED Notes (Signed)
POC urine pregnancy was negative. 

## 2023-06-04 NOTE — ED Triage Notes (Signed)
Pt states she was outside Saturday and slipped on wet grass causing her to fall backwards and hit the back of her head on concrete; pt states she had loc for an unknown amount of time and when she woke up her friend had helped her to the couch but pt states she was unable to immediately see  Pt states since the fall she has had sharp pains to left side of head with nausea and dizziness

## 2023-06-12 ENCOUNTER — Encounter: Payer: Self-pay | Admitting: Family Medicine

## 2023-06-12 NOTE — Telephone Encounter (Signed)
Please inform patient,  Thank you for reaching out. I understand the discomfort you're experiencing. Before I can make any clinical decisions, I would need to conduct a proper evaluation and assessment. Given that your symptoms are worsening significantly, with severe headaches and muscle tightness, it would be best to go to the Emergency Department to ensure you receive immediate care or continue taking Robaxin for pain

## 2023-06-14 ENCOUNTER — Ambulatory Visit: Payer: 59 | Admitting: Sports Medicine

## 2023-06-14 ENCOUNTER — Other Ambulatory Visit: Payer: Self-pay | Admitting: Internal Medicine

## 2023-06-14 ENCOUNTER — Encounter: Payer: Self-pay | Admitting: Internal Medicine

## 2023-06-14 VITALS — BP 118/78 | HR 87 | Ht 66.0 in | Wt 160.0 lb

## 2023-06-14 DIAGNOSIS — S060XAA Concussion with loss of consciousness status unknown, initial encounter: Secondary | ICD-10-CM | POA: Diagnosis not present

## 2023-06-14 DIAGNOSIS — M542 Cervicalgia: Secondary | ICD-10-CM

## 2023-06-14 DIAGNOSIS — F411 Generalized anxiety disorder: Secondary | ICD-10-CM

## 2023-06-14 DIAGNOSIS — G4700 Insomnia, unspecified: Secondary | ICD-10-CM

## 2023-06-14 MED ORDER — CYCLOBENZAPRINE HCL 5 MG PO TABS: 5.0000 mg | ORAL_TABLET | Freq: Every day | ORAL | 0 refills | Status: DC

## 2023-06-14 NOTE — Progress Notes (Signed)
Aleen Sells D.[facility] [address] [place] [address] Phone: [phone]  Assessment and Plan:     1. Concussion with unknown loss of consciousness status, initial encounter 2. Neck pain 3. Insomnia, unspecified type -Acute, complicated, initial sports medicine visit - Consistent with concussion based on HPI, physical exam, symptom severity score, special testing, ER notes, MOI - I suspect patient's symptoms have flared and failed to improve likely due to quick return to work - Recommend out of work through 06/24/2023.  May return to work on 06/25/2023 working 4 hours maximum.  Work note provided.  FMLA paperwork completed - Reassuring that patient had unremarkable CT head and neck at ER visit - Start melatonin 5 mg nightly with goal of 7-8 continuous hours of sleep.  If possible, I would not recommend using Xanax for sleep aid as it could interrupt REM sleep which is significantly beneficial for concussion recovery - Start Flexeril 5 to 10 mg nightly as needed for muscle spasms - Start HEP for neck and trapezius  15 additional minutes spent for educating Therapeutic Home Exercise Program.  This included exercises focusing on stretching, strengthening, with focus on eccentric aspects.   Long term goals include an improvement in range of motion, strength, endurance as well as avoiding reinjury. Patient's frequency would include in 1-2 times a day, 3-5 times a week for a duration of 6-12 weeks. Proper technique shown and discussed handout in great detail with ATC.  All questions were discussed and answered.    Date of injury was 06/02/2023.  Original symptom severity scores were 21 and 113. The patient was counseled on the nature of the injury, typical course and potential options for further evaluation and treatment. Discussed the importance of compliance with recommendations. Patient stated understanding of this plan and willingness to  comply.  Recommendations:  -  Relative mental and physical rest for 48 hours after concussive event - Recommend light aerobic activity while keeping symptoms less than 3/10 - Stop mental or physical activities that cause symptoms to worsen greater than 3/10, and wait 24 hours before attempting them again - Eliminate screen time as much as possible for first 48 hours after concussive event, then continue limited screen time (recommend less than 2 hours per day)   - Encouraged to RTC in 2 weeks for reassessment or sooner for any concerns or acute changes   Pertinent previous records reviewed include ER note 06/04/2023, CT head 06/04/2023, CT neck 06/04/2023   Time of visit 45 minutes, which included chart review, physical exam, treatment plan, symptom severity score, VOMS, and tandem gait testing being performed, interpreted, and discussed with patient at today's visit.   Subjective:   I, [name], am serving as a Neurosurgeon for Doctor Richardean Sale  Chief Complaint: concussion symptoms   HPI:   06/14/23 Patient is a 52 year old female complaining of concussion symptoms. Patient states was seen in ED 06/04/2023 she slipped backwards on wet grass and hit her head on the concrete.  She reports that she did not lose any consciousness but had to be helped up.  She reports that she is ambulatory with some assistance and they sat her on the couch.  She reports that she realizes people were talking to her however she could not see them but reports that she did not know if her eyes were open or not.  She was consuming alcohol that day and reports she had around 6 beers.  She reports  that she was given a blanket and she laid down to take a nap.  She reports that she woke up multiple times the night to pee and did not have any visual problems then but did have a headache.  She reports that mainly her headache is on the left side however she reports she hit the back of her head. She feels more  lightheaded than she does room spinning sensation.    Concussion HPI:  - Injury date: 06/02/2023   - Mechanism of injury: fall   - LOC: no  - Initial evaluation: ED  - Previous head injuries/concussions: yes    - Previous imaging: no    - Social history:works at the cancer center  Hospitalization for head injury? No Diagnosed/treated for headache disorder, migraines, or seizures? No Diagnosed with learning disability Elnita Maxwell? No Diagnosed with ADD/ADHD? No Diagnose with Depression, anxiety, or other Psychiatric Disorder? Depression and anxiety    Current medications:  Current Outpatient Medications  Medication Sig Dispense Refill   ALPRAZolam (XANAX) 1 MG tablet Take 1 tablet by mouth twice daily as needed for anxiety 60 tablet 3   citalopram (CELEXA) 40 MG tablet Take 1 tablet (40 mg total) by mouth daily. 90 tablet 1   cyclobenzaprine (FLEXERIL) 5 MG tablet Take 1 tablet (5 mg total) by mouth at bedtime. 30 tablet 0   EPINEPHrine (EPIPEN 2-PAK) 0.3 mg/0.3 mL IJ SOAJ injection Inject 0.3 mg into the muscle as needed for anaphylaxis. 1 each 1   ipratropium (ATROVENT) 0.06 % nasal spray Place 2 sprays into both nostrils 3 (three) times daily. 15 mL 0   lansoprazole (PREVACID) 30 MG capsule Take 1 capsule (30 mg total) by mouth daily at 12 noon. 90 capsule 3   levonorgestrel (MIRENA) 20 MCG/24HR IUD 1 each by Intrauterine route once.     methocarbamol (ROBAXIN) 500 MG tablet Take 1 tablet (500 mg total) by mouth 2 (two) times daily. 10 tablet 0   polyethylene glycol (MIRALAX / GLYCOLAX) 17 g packet Take 17 g by mouth daily.     amoxicillin-clavulanate (AUGMENTIN) 875-125 MG tablet Take 1 tablet by mouth 2 (two) times daily. 14 tablet 0   fluticasone (FLONASE) 50 MCG/ACT nasal spray Place 1 spray into both nostrils 2 (two) times daily as needed for allergies or rhinitis 16 g 11   Semaglutide-Weight Management 2.4 MG/0.75ML SOAJ Inject 2.4 mg into the skin every 14 (fourteen) days.      terbinafine (LAMISIL) 250 MG tablet Take 1 tablet (250 mg total) by mouth daily. 84 tablet 0   No current facility-administered medications for this visit.      Objective:     Vitals:   06/14/23 1337  BP: 118/78  Pulse: 87  SpO2: 100%  Weight: 160 lb (72.6 kg)  Height: 5\' 6"  (1.676 m)      Body mass index is 25.82 kg/m.    Physical Exam:     General: Well-appearing, cooperative, sitting comfortably in no acute distress.  Psychiatric: Mood and affect are appropriate.   Neuro:sensation intact and strength 5/5 with no deficits, no atrophy, normal muscle tone   Today's Symptom Severity Score:  Scores: 0-6  Headache:6 "Pressure in head":6  Neck Pain:6 Nausea or vomiting:1 Dizziness:6 Blurred vision:5 Balance problems:5 Sensitivity to light:6 Sensitivity to noise:6 Feeling slowed down:6 Feeling like "in a fog":6 "Don't feel right":6 Difficulty concentrating:6 Difficulty remembering:6  Fatigue or low energy:6 Confusion:0  Drowsiness:4  More emotional:6 Irritability:2 Sadness:6  Nervous or Anxious:6 Trouble falling  or staying asleep:6  Total number of symptoms: 21/22  Symptom Severity index: 113/132  Worse with physical activity? Yes  Worse with mental activity? Yes  Percent improved since injury: 0%    Full pain-free cervical PROM: No, restricted sidebending bilaterally and tension with rotation   Cognitive:  - Months backwards: 0 mistakes.  19 seconds  mVOMS:   - Baseline symptoms: 0 - Horizontal Vestibular-Ocular Reflex: Blurred vision - Smooth pursuits: Upset stomach - Horizontal Saccades: Upset stomach - Visual Motion Sensitivity Test: Upset stomach - Convergence: 12, 12 cm despite using contacts (<5 cm normal)    Autonomic:  - Symptomatic with supine to standing: Yes, upset stomach  Complex Tandem Gait: - Forward, eyes open: 1 errors - Backward, eyes open: 2 errors - Forward, eyes closed: 5 errors - Backward, eyes closed: 4  errors  Electronically signed by:  Aleen Sells D.[facility] 2:21 PM 06/14/23

## 2023-06-14 NOTE — Patient Instructions (Addendum)
Recommendations:  -Relative mental and physical rest for 48 hours after concussive event -Recommend light aerobic activity while keeping symptoms less than 3/10 -Stop mental or physical activities that cause symptoms to worsen greater than 3/10, and wait 24 hours before attempting them again -Eliminate screen time as much as possible for first 48 hours after concussive event, then continue limited screen time (recommend less than 2 hours per day) Work note provided  Out of work until 06/24/2023 can return to work 06/25/2023 working 4 hours maximum  Melatonin 5 mg nightly with the goal of 7-8 hours  Flexeril 5-10 mg nightly as needed for muscle spasms Neck and trap HEP  2 week follow up

## 2023-06-15 ENCOUNTER — Ambulatory Visit: Payer: 59 | Admitting: Gastroenterology

## 2023-06-15 ENCOUNTER — Ambulatory Visit: Payer: 59 | Admitting: Family Medicine

## 2023-06-18 ENCOUNTER — Encounter: Payer: 59 | Admitting: Family Medicine

## 2023-06-19 ENCOUNTER — Encounter: Payer: Self-pay | Admitting: Sports Medicine

## 2023-06-21 ENCOUNTER — Ambulatory Visit: Payer: 59 | Admitting: Internal Medicine

## 2023-06-22 ENCOUNTER — Ambulatory Visit: Payer: 59 | Admitting: Gastroenterology

## 2023-06-22 ENCOUNTER — Encounter: Payer: Self-pay | Admitting: Gastroenterology

## 2023-06-22 VITALS — BP 123/80 | HR 79 | Temp 98.6°F | Ht 68.0 in | Wt 166.2 lb

## 2023-06-22 DIAGNOSIS — Z860101 Personal history of adenomatous and serrated colon polyps: Secondary | ICD-10-CM

## 2023-06-22 DIAGNOSIS — K219 Gastro-esophageal reflux disease without esophagitis: Secondary | ICD-10-CM | POA: Diagnosis not present

## 2023-06-22 DIAGNOSIS — K581 Irritable bowel syndrome with constipation: Secondary | ICD-10-CM

## 2023-06-22 DIAGNOSIS — K760 Fatty (change of) liver, not elsewhere classified: Secondary | ICD-10-CM | POA: Diagnosis not present

## 2023-06-22 DIAGNOSIS — Z860100 Personal history of colon polyps, unspecified: Secondary | ICD-10-CM

## 2023-06-22 NOTE — Progress Notes (Signed)
GI Office Note    Referring Provider: Anabel Halon, MD Primary Care Physician:  Anabel Halon, MD  Primary Gastroenterologist: Hennie Duos. Marletta Lor, DO   Chief Complaint   Chief Complaint  Patient presents with   Follow-up    Follow up on hemorrhoids which pt state they are better    History of Present Illness   Theresa [name] is a 52 y.o. female presenting today for follow-up.  She was seen in the office back in July.  She has a history of chronic GERD, IBS-C, history of colon polyps, external hemorrhoids.  He also has a history of elevated chromogranin a level without symptoms of carcinoid.  Patient did complete nuclear med dotatate PET scan July 2021 with no evidence of well-differentiated neuroendocrine tumor on dotatate PET scan.  She has a history of chronic constipation since she was a teenager.  She has failed Linzess, Amitiza, Trulance.  MiraLAX is the only thing that seems to work for her.  She takes it daily.  She was diagnosed with a rectocele a couple years ago by her gynecologist.  She has had problems with external hemorrhoids infringing on her vaginal opening.  Completed internal hemorrhoid banding in 2014. Was complaining of solid food dysphagia and pill dysphagia at last office visit.  She had a liver biopsy in 2016 at time of cholecystectomy due to possible fatty liver seen on u/s, path with focal hepatic steatosis.  Today: patient saw Dr. Henreitta Leber for hemorrhoids and elected not to pursue hemorrhoidectomy.  States she is not really having any problems anymore especially since she is controlling her constipation.  She takes MiraLAX as needed.  Denies any GI symptoms.  No blood in the stool.  No upper GI symptoms.  Currently suffering from a concussion.  Been out of work for couple of weeks.  We discussed focal hepatic steatosis seen on liver biopsy in 2016.  At that time she weighed over 205 pounds.  Currently weighing 166 pounds.  Most recent liver imaging in  February 2023, liver unremarkable.  LFTs have been normal.  Discussed potentially pursuing workup to rule out fibrosis but she is not interested at this time.   EGD August 2024: -Normal esophagus -Multiple gastric polyps, 5 of the polyps greater than 1 cm removed, benign fundic gland  Colonoscopy August 2024 -Hemorrhoids found on perianal exam -Nonbleeding internal hemorrhoids -one 4 mm polyp in the ascending colon removed, sessile serrated -Examined portion of ileum normal -Colonoscopy 7 years   Medications   Current Outpatient Medications  Medication Sig Dispense Refill   ALPRAZolam (XANAX) 1 MG tablet Take 1 tablet by mouth twice daily as needed for anxiety 60 tablet 3   citalopram (CELEXA) 40 MG tablet Take 1 tablet (40 mg total) by mouth daily. 90 tablet 1   cyclobenzaprine (FLEXERIL) 5 MG tablet Take 1 tablet (5 mg total) by mouth at bedtime. 30 tablet 0   EPINEPHrine (EPIPEN 2-PAK) 0.3 mg/0.3 mL IJ SOAJ injection Inject 0.3 mg into the muscle as needed for anaphylaxis. 1 each 1   ipratropium (ATROVENT) 0.06 % nasal spray Place 2 sprays into both nostrils 3 (three) times daily. 15 mL 0   lansoprazole (PREVACID) 30 MG capsule Take 1 capsule (30 mg total) by mouth daily at 12 noon. 90 capsule 3   levonorgestrel (MIRENA) 20 MCG/24HR IUD 1 each by Intrauterine route once.     polyethylene glycol (MIRALAX / GLYCOLAX) 17 g packet Take 17 g by mouth daily.  No current facility-administered medications for this visit.    Allergies   Allergies as of 06/22/2023 - Review Complete 06/22/2023  Allergen Reaction Noted   Alpha-gal Anaphylaxis, Nausea And Vomiting, and Shortness Of Breath 07/19/2022      Review of Systems   General: Negative for anorexia, weight loss, fever, chills, fatigue, weakness. Concussion symptoms ENT: Negative for hoarseness, difficulty swallowing , nasal congestion. CV: Negative for chest pain, angina, palpitations, dyspnea on exertion, peripheral edema.   Respiratory: Negative for dyspnea at rest, dyspnea on exertion, cough, sputum, wheezing.  GI: See history of present illness. GU:  Negative for dysuria, hematuria, urinary incontinence, urinary frequency, nocturnal urination.  Endo: Negative for unusual weight change.     Physical Exam   BP 123/80   Pulse 79   Temp 98.6 F (37 C)   Ht 5\' 8"  (1.727 m)   Wt 166 lb 3.2 oz (75.4 kg)   BMI 25.27 kg/m    General: Well-nourished, well-developed in no acute distress.  Eyes: No icterus. Mouth: Oropharyngeal mucosa moist and pink    Abdomen: Bowel sounds are normal, nontender, nondistended, no hepatosplenomegaly or masses,  no abdominal bruits or hernia , no rebound or guarding.  Rectal: not performed  Extremities: No lower extremity edema. No clubbing or deformities. Neuro: Alert and oriented x 4   Skin: Warm and dry, no jaundice.   Psych: Alert and cooperative, normal mood and affect.  Labs   Lab Results  Component Value Date   NA 140 07/27/2022   CL 103 07/27/2022   K 3.9 07/27/2022   CO2 24 07/27/2022   BUN 9 07/27/2022   CREATININE 0.79 07/27/2022   EGFR 91 07/27/2022   CALCIUM 9.2 07/27/2022   ALBUMIN 4.3 07/27/2022   GLUCOSE 80 07/27/2022   Lab Results  Component Value Date   ALT 16 07/27/2022   AST 14 07/27/2022   ALKPHOS 76 07/27/2022   BILITOT 0.5 07/27/2022   Lab Results  Component Value Date   WBC 4.1 07/27/2022   HGB 14.8 07/27/2022   HCT 43.4 07/27/2022   MCV 89 07/27/2022   PLT 271 07/27/2022    Imaging Studies   CT Head Wo Contrast  Result Date: 06/04/2023 CLINICAL DATA:  Slipped and fell 2 days ago, loss of consciousness and loss of vision EXAM: CT HEAD WITHOUT CONTRAST CT CERVICAL SPINE WITHOUT CONTRAST TECHNIQUE: Multidetector CT imaging of the head and cervical spine was performed following the standard protocol without intravenous contrast. Multiplanar CT image reconstructions of the cervical spine were also generated. RADIATION DOSE  REDUCTION: This exam was performed according to the departmental dose-optimization program which includes automated exposure control, adjustment of the mA and/or kV according to patient size and/or use of iterative reconstruction technique. COMPARISON:  Cervical spine radiographs 04/17/2017, CT head 02/26/2019 FINDINGS: CT HEAD FINDINGS Brain: There is no acute intracranial hemorrhage, extra-axial fluid collection, or acute infarct. Parenchymal volume is normal. The ventricles are normal in size. Gray-white differentiation is preserved The pituitary and suprasellar region are normal. There is no mass lesion. There is no mass effect or midline shift. Vascular: No hyperdense vessel or unexpected calcification. Skull: Normal. Negative for fracture or focal lesion. Sinuses/Orbits: The imaged paranasal sinuses are clear. The globes and orbits are unremarkable. Other: The mastoid air cells and middle ear cavities are clear. CT CERVICAL SPINE FINDINGS Alignment: Normal.  There is no evidence of traumatic malalignment. Skull base and vertebrae: Skull base alignment is maintained. Vertebral body heights are preserved. There  is no evidence of acute fracture. There is no suspicious osseous lesion. Soft tissues and spinal canal: No prevertebral fluid or swelling. No visible canal hematoma. Disc levels: The disc heights are overall preserved with minimal degenerative endplate change at C3-C4 and C4-C5. There is no significant spinal canal or neural foraminal stenosis. Upper chest: The imaged lung apices are clear. Other: None. IMPRESSION: 1. No acute intracranial pathology. 2. No acute fracture or traumatic malalignment of the cervical spine. Electronically Signed   By: Lesia Hausen M.D.   On: 06/04/2023 09:32   CT Cervical Spine Wo Contrast  Result Date: 06/04/2023 CLINICAL DATA:  Slipped and fell 2 days ago, loss of consciousness and loss of vision EXAM: CT HEAD WITHOUT CONTRAST CT CERVICAL SPINE WITHOUT CONTRAST  TECHNIQUE: Multidetector CT imaging of the head and cervical spine was performed following the standard protocol without intravenous contrast. Multiplanar CT image reconstructions of the cervical spine were also generated. RADIATION DOSE REDUCTION: This exam was performed according to the departmental dose-optimization program which includes automated exposure control, adjustment of the mA and/or kV according to patient size and/or use of iterative reconstruction technique. COMPARISON:  Cervical spine radiographs 04/17/2017, CT head 02/26/2019 FINDINGS: CT HEAD FINDINGS Brain: There is no acute intracranial hemorrhage, extra-axial fluid collection, or acute infarct. Parenchymal volume is normal. The ventricles are normal in size. Gray-white differentiation is preserved The pituitary and suprasellar region are normal. There is no mass lesion. There is no mass effect or midline shift. Vascular: No hyperdense vessel or unexpected calcification. Skull: Normal. Negative for fracture or focal lesion. Sinuses/Orbits: The imaged paranasal sinuses are clear. The globes and orbits are unremarkable. Other: The mastoid air cells and middle ear cavities are clear. CT CERVICAL SPINE FINDINGS Alignment: Normal.  There is no evidence of traumatic malalignment. Skull base and vertebrae: Skull base alignment is maintained. Vertebral body heights are preserved. There is no evidence of acute fracture. There is no suspicious osseous lesion. Soft tissues and spinal canal: No prevertebral fluid or swelling. No visible canal hematoma. Disc levels: The disc heights are overall preserved with minimal degenerative endplate change at C3-C4 and C4-C5. There is no significant spinal canal or neural foraminal stenosis. Upper chest: The imaged lung apices are clear. Other: None. IMPRESSION: 1. No acute intracranial pathology. 2. No acute fracture or traumatic malalignment of the cervical spine. Electronically Signed   By: Lesia Hausen M.D.   On:  06/04/2023 09:32    Assessment/Plan:   IBS-C -continue Miralax 17g daily  GERD -continue lansoprazole 30mg  daily -antireflux measures  H/O adenomatous colon polyps -next colonoscopy 7 years  Focal hepatic steatosis on liver biopsy in 2016 -LFTs normal -intentional weight loss of 60 pounds -continue regular exercise, low fat diet, limit etoh. -offered fibrosis work up but she is not interested given normal liver on CT last year and normal LFTs.     Leanna Battles. Melvyn Neth, MHS, PA-C [facility]

## 2023-06-22 NOTE — Patient Instructions (Signed)
Continue Miralax and lansoprazole as before.   If you decide you want to complete work up to rule out liver fibrosis, please let me know.

## 2023-06-28 NOTE — Progress Notes (Signed)
Theresa [name] D.[facility] [address] [place] [address] Phone: [phone]  Assessment and Plan:     1. Concussion with unknown loss of consciousness status, initial encounter - Acute, improved, subsequent visit - Significant improvement in concussion symptoms with work restrictions, relative rest, concussion treatment protocol - Patient has tolerated returning to work for 4 hours maximum.  At this point patient is cleared to return to work without restrictions.  Work note provided - Reassuring the patient had unremarkable CT head and neck at ER visit - May discontinue Flexeril 5 mg daily as needed   Date of injury was 06/02/2023. Symptom severity scores of 3 and 10 today. Original symptom severity scores were 21 and 113. The patient was counseled on the nature of the injury, typical course and potential options for further evaluation and treatment. Discussed the importance of compliance with recommendations. Patient stated understanding of this plan and willingness to comply.  Recommendations:  -  Relative mental and physical rest for 48 hours after concussive event - Recommend light aerobic activity while keeping symptoms less than 3/10 - Stop mental or physical activities that cause symptoms to worsen greater than 3/10, and wait 24 hours before attempting them again - Eliminate screen time as much as possible for first 48 hours after concussive event, then continue limited screen time (recommend less than 2 hours per day)  Pertinent previous records reviewed include none  - Encouraged to RTC as needed if no improvement or worsening of symptoms   Time of visit 32 minutes, which included chart review, physical exam, treatment plan, symptom severity score, VOMS, and tandem gait testing being performed, interpreted, and discussed with patient at today's visit.   Subjective:   I, Theresa [name], am serving as a Neurosurgeon for Doctor Richardean Sale   Chief  Complaint: concussion symptoms    HPI:    06/14/23 Patient is a 52 year old female complaining of concussion symptoms. Patient states was seen in ED 06/04/2023 she slipped backwards on wet grass and hit her head on the concrete.  She reports that she did not lose any consciousness but had to be helped up.  She reports that she is ambulatory with some assistance and they sat her on the couch.  She reports that she realizes people were talking to her however she could not see them but reports that she did not know if her eyes were open or not.  She was consuming alcohol that day and reports she had around 6 beers.  She reports that she was given a blanket and she laid down to take a nap.  She reports that she woke up multiple times the night to pee and did not have any visual problems then but did have a headache.  She reports that mainly her headache is on the left side however she reports she hit the back of her head. She feels more lightheaded than she does room spinning sensation.   06/29/2023 Patient states that she is good    Concussion HPI:  - Injury date: 06/02/2023   - Mechanism of injury: fall   - LOC: no  - Initial evaluation: ED  - Previous head injuries/concussions: yes    - Previous imaging: no    - Social history:works at the cancer center   Hospitalization for head injury? No Diagnosed/treated for headache disorder, migraines, or seizures? No Diagnosed with learning disability Elnita Maxwell? No Diagnosed with ADD/ADHD? No Diagnose with Depression, anxiety, or other  Psychiatric Disorder? Depression and anxiety    Current medications:  Current Outpatient Medications  Medication Sig Dispense Refill   ALPRAZolam (XANAX) 1 MG tablet Take 1 tablet by mouth twice daily as needed for anxiety 60 tablet 3   citalopram (CELEXA) 40 MG tablet Take 1 tablet (40 mg total) by mouth daily. 90 tablet 1   cyclobenzaprine (FLEXERIL) 5 MG tablet Take 1 tablet (5 mg total) by mouth at bedtime. 30  tablet 0   EPINEPHrine (EPIPEN 2-PAK) 0.3 mg/0.3 mL IJ SOAJ injection Inject 0.3 mg into the muscle as needed for anaphylaxis. 1 each 1   ipratropium (ATROVENT) 0.06 % nasal spray Place 2 sprays into both nostrils 3 (three) times daily. 15 mL 0   lansoprazole (PREVACID) 30 MG capsule Take 1 capsule (30 mg total) by mouth daily at 12 noon. 90 capsule 3   levonorgestrel (MIRENA) 20 MCG/24HR IUD 1 each by Intrauterine route once.     polyethylene glycol (MIRALAX / GLYCOLAX) 17 g packet Take 17 g by mouth daily.     No current facility-administered medications for this visit.      Objective:     Vitals:   06/29/23 1052  BP: 108/80  Weight: 171 lb (77.6 kg)  Height: 5\' 8"  (1.727 m)      Body mass index is 26 kg/m.    Physical Exam:     General: Well-appearing, cooperative, sitting comfortably in no acute distress.  Psychiatric: Mood and affect are appropriate.   Neuro:sensation intact and strength 5/5 with no deficits, no atrophy, normal muscle tone   Today's Symptom Severity Score:  Scores: 0-6  Headache:0 "Pressure in head":0  Neck Pain:0 Nausea or vomiting:0 Dizziness:0 Blurred vision:0 Balance problems:0 Sensitivity to light:1 Sensitivity to noise:0 Feeling slowed down:0 Feeling like "in a fog":0 "Don't feel right":0 Difficulty concentrating:0 Difficulty remembering:0  Fatigue or low energy:5 Confusion:0  Drowsiness:0  More emotional:0 Irritability:0 Sadness:0  Nervous or Anxious:0 Trouble falling or staying asleep:4  Total number of symptoms: 3/22  Symptom Severity index: 10/132  Worse with physical activity? No Worse with mental activity? No Percent improved since injury: 100%    Full pain-free cervical PROM: yes     Cognitive:  - Months backwards: 0 Mistakes. 18 seconds  mVOMS:   - Baseline symptoms: 0 - Horizontal Vestibular-Ocular Reflex: 0/10  - Smooth pursuits: 0/10  - Horizontal Saccades:  0/10  - Visual Motion Sensitivity Test:  0/10  -  Convergence: 3,3cm (<5 cm normal)    Autonomic:  - Symptomatic with supine to standing: No   Complex Tandem Gait: - Forward, eyes open: 0 errors - Backward, eyes open: 0 errors - Forward, eyes closed: 0 errors - Backward, eyes closed: 0 errors  Electronically signed by:  Theresa [name] D.[facility] 11:16 AM 06/29/23

## 2023-06-29 ENCOUNTER — Ambulatory Visit: Payer: 59 | Admitting: Sports Medicine

## 2023-06-29 VITALS — BP 108/80 | Ht 68.0 in | Wt 171.0 lb

## 2023-06-29 DIAGNOSIS — S060XAA Concussion with loss of consciousness status unknown, initial encounter: Secondary | ICD-10-CM | POA: Diagnosis not present

## 2023-06-29 NOTE — Patient Instructions (Signed)
Work note provided  As needed follow up

## 2023-08-05 ENCOUNTER — Other Ambulatory Visit: Payer: Self-pay | Admitting: Internal Medicine

## 2023-08-05 DIAGNOSIS — F411 Generalized anxiety disorder: Secondary | ICD-10-CM

## 2023-08-09 ENCOUNTER — Other Ambulatory Visit: Payer: Self-pay | Admitting: Internal Medicine

## 2023-08-09 ENCOUNTER — Encounter: Payer: Self-pay | Admitting: Internal Medicine

## 2023-08-09 DIAGNOSIS — F411 Generalized anxiety disorder: Secondary | ICD-10-CM

## 2023-08-14 ENCOUNTER — Encounter: Payer: Self-pay | Admitting: Internal Medicine

## 2023-08-14 ENCOUNTER — Ambulatory Visit (INDEPENDENT_AMBULATORY_CARE_PROVIDER_SITE_OTHER): Payer: 59 | Admitting: Internal Medicine

## 2023-08-14 VITALS — BP 122/84 | HR 106 | Ht 66.0 in | Wt 166.0 lb

## 2023-08-14 DIAGNOSIS — E559 Vitamin D deficiency, unspecified: Secondary | ICD-10-CM | POA: Diagnosis not present

## 2023-08-14 DIAGNOSIS — Z85828 Personal history of other malignant neoplasm of skin: Secondary | ICD-10-CM | POA: Diagnosis not present

## 2023-08-14 DIAGNOSIS — E782 Mixed hyperlipidemia: Secondary | ICD-10-CM | POA: Diagnosis not present

## 2023-08-14 DIAGNOSIS — M797 Fibromyalgia: Secondary | ICD-10-CM | POA: Diagnosis not present

## 2023-08-14 DIAGNOSIS — L989 Disorder of the skin and subcutaneous tissue, unspecified: Secondary | ICD-10-CM | POA: Diagnosis not present

## 2023-08-14 DIAGNOSIS — R739 Hyperglycemia, unspecified: Secondary | ICD-10-CM

## 2023-08-14 DIAGNOSIS — Z0001 Encounter for general adult medical examination with abnormal findings: Secondary | ICD-10-CM | POA: Diagnosis not present

## 2023-08-14 DIAGNOSIS — F411 Generalized anxiety disorder: Secondary | ICD-10-CM

## 2023-08-14 MED ORDER — CITALOPRAM HYDROBROMIDE 40 MG PO TABS: 40.0000 mg | ORAL_TABLET | Freq: Every day | ORAL | 3 refills | Status: DC

## 2023-08-14 NOTE — Assessment & Plan Note (Signed)
Well-controlled with Xanax 1 mg BID PRN and Celexa Had switched to Cymbalta for benefit with fibromyalgia, but did not tolerate it well

## 2023-08-14 NOTE — Assessment & Plan Note (Signed)
Check lipid profile Advised to follow low-carb and low-cholesterol diet for now With weight loss from GLP-1 agonist, her lipid profile had improved as well

## 2023-08-14 NOTE — Assessment & Plan Note (Signed)
Physical exam as documented. Fasting blood tests today. Mammography and [place] of breast at [facility]. Gyn. clinic.

## 2023-08-14 NOTE — Patient Instructions (Signed)
 Please continue to take medications as prescribed.  Please continue to follow low carb diet and perform moderate exercise/walking at least 150 mins/week.

## 2023-08-14 NOTE — Assessment & Plan Note (Signed)
 Had Rheumatology evaluation in 2019, was told of fibromyalgia, but did not start any treatment at that time Had started Cymbalta, but did not tolerate it If persistent symptoms or insomnia, may switch to Elavil - she prefers to stay with Celexa for now

## 2023-08-14 NOTE — Progress Notes (Signed)
 Established Patient Office Visit  Subjective:  Patient ID: Theresa [name], female    DOB: 1971-05-03  Age: 52 y.o. MRN: [id number]  CC:  Chief Complaint  Patient presents with   Annual Exam    HPI Theresa [name] is a 52 y.o. female with past medical history of GERD, chronic rhinitis, GAD, fibromyalgia and prediabetes who presents for annual physical.  She has history of fibromyalgia. Her myalgias have improved with weight loss. She was given trial of Cymbalta , but she did not tolerate it.  She has tried massage therapy with mild relief. She has chronic low back pain, denies any numbness or tingling of LE.  Denies saddle anesthesia, urinary or stool incontinence.  She has gained about 17 lbs since the last visit. She has restarted taking semaglutide  formulation since 12/24 through [facility]. She used to get Wegovy  in the past, but had severe nausea with it. Her nausea and vomiting have resolved now.  Her weight is stable overall.  She has been taking Celexa  and Xanax  as needed for anxiety. She had to take Xanax  BID on some days for insomnia and GAD as well. Her sleep has improved now.  She had a head injury from a mechanical fall in 10/24.  She had neck pain for 2 weeks, improved with Flexeril  and rest.  Denies any focal numbness or weakness currently.  Denies visual disturbance currently.  She has a skin lesion on right upper chest wall area, that she has noticed for the last 4-5 months.  She has noticed slight increase in size as well.  Denies any local itching, discharge or bleeding.  She has history of BCC of skin, s/p excision.   Past Medical History:  Diagnosis Date   Allergy    Allergy to alpha-gal    Anxiety    Colon adenoma DEC 2014   ONE(7 MM) SIMPLE([place])   Concussion with loss of consciousness 02/17/2019   Depression    Edema    GERD (gastroesophageal reflux disease)    History of abnormal cervical Pap smear 12/02/2013   History of nephrolithiasis    HPV (human  papilloma virus) infection    Hyperlipidemia    IBS (irritable bowel syndrome)    constipation   Insomnia    Lymphadenopathy 07/12/2020   Obesity 01/14/2014   Positive test for Epstein-Barr virus (EBV) 08/21/2017   Pre-diabetes 09/2014   Superficial basal cell carcinoma (BCC) 12/26/2019   Right Breast- treatment Imiquimoid   Yeast infection 09/04/2013    Past Surgical History:  Procedure Laterality Date   BIOPSY N/A 08/01/2013   Procedure: BIOPSY;  Surgeon: Margo LITTIE Haddock, MD;  [facility];  Service: Endoscopy;  Laterality: N/A;  FOR MICROSCOPIC COLITIS   BIOPSY  08/09/2018   Procedure: BIOPSY;  Surgeon: Golda Claudis PENNER, MD;  [facility];  Service: Endoscopy;;  gastric   BREAST REDUCTION SURGERY  2009   BREAST SURGERY  2007   breast reduction   CHOLECYSTECTOMY N/A 11/04/2014   Procedure: LAPAROSCOPIC CHOLECYSTECTOMY;  Surgeon: Oneil Budge Md, MD;  Location: [facility];  Service: General;  Laterality: N/A;   COLONOSCOPY  Oct 2009   Dr. Avram: int/ext hemorrhoids, ?mild proctitis but path benign, likely r/t irritation from bowel prep   COLONOSCOPY N/A 08/01/2013   Procedure: COLONOSCOPY;  Surgeon: Margo LITTIE Haddock, MD;  [facility];  Service: Endoscopy;  Laterality: N/A;  12:00   COLONOSCOPY WITH PROPOFOL  N/A 08/09/2018   Procedure: COLONOSCOPY WITH PROPOFOL ;  Surgeon: Golda Claudis PENNER, MD;  [facility];  Service: Endoscopy;  Laterality: N/A;  730   COLONOSCOPY WITH PROPOFOL  N/A 03/15/2023   Procedure: COLONOSCOPY WITH PROPOFOL ;  Surgeon: Cindie Carlin POUR, DO;  [facility];  Service: Endoscopy;  Laterality: N/A;  10:30 am, asa 2   ESOPHAGOGASTRODUODENOSCOPY  Dec 2009   Dr. Avram: reflux esophagitis, proximal gastric polyps, benign   ESOPHAGOGASTRODUODENOSCOPY N/A 12/18/2014   Procedure: ESOPHAGOGASTRODUODENOSCOPY (EGD);  Surgeon: Claudis PENNER Golda, MD;  [facility];  Service: Endoscopy;  Laterality: N/A;  230    ESOPHAGOGASTRODUODENOSCOPY (EGD) WITH PROPOFOL  N/A 08/09/2018   Procedure: ESOPHAGOGASTRODUODENOSCOPY (EGD) WITH PROPOFOL ;  Surgeon: Golda Claudis PENNER, MD;  [facility];  Service: Endoscopy;  Laterality: N/A;   ESOPHAGOGASTRODUODENOSCOPY (EGD) WITH PROPOFOL  N/A 03/15/2023   Procedure: ESOPHAGOGASTRODUODENOSCOPY (EGD) WITH PROPOFOL ;  Surgeon: Cindie Carlin POUR, DO;  [facility];  Service: Endoscopy;  Laterality: N/A;   HEMORRHOID BANDING N/A 08/01/2013   Procedure: HEMORRHOID BANDING;  Surgeon: Margo LITTIE Haddock, MD;  [facility];  Service: Endoscopy;  Laterality: N/A;  internal hemorrhoid banding   LIVER BIOPSY N/A 11/04/2014   Procedure: LIVER BIOPSY;  Surgeon: Oneil Budge Md, MD;  Location: [facility];  Service: General;  Laterality: N/A;   POLYPECTOMY  2015   Dr.Fields   POLYPECTOMY  08/09/2018   Procedure: POLYPECTOMY;  Surgeon: Golda Claudis PENNER, MD;  [facility];  Service: Endoscopy;;  colon   POLYPECTOMY  03/15/2023   Procedure: POLYPECTOMY;  Surgeon: Cindie Carlin POUR, DO;  [facility];  Service: Endoscopy;;  gastric/ascending colon   REDUCTION MAMMAPLASTY Bilateral 2006   UPPER GASTROINTESTINAL ENDOSCOPY  2006   Dr. Theodis in [place]    Family History  Problem Relation Age of Onset   Cancer Mother        head and neck   Hypertension Father    Hyperlipidemia Father    Pancreatic cancer Maternal Grandfather    Colon cancer Neg Hx     Social History   Socioeconomic History   Marital status: Married    Spouse name: Not on file   Number of children: 1   Years of education: Not on file   Highest education level: Associate degree: academic program  Occupational History   Occupation: Teacher, Adult Education: [employer]  Tobacco Use   Smoking status: Never   Smokeless tobacco: Never   Tobacco comments:    Never smoker  Vaping Use   Vaping status: Never Used  Substance and Sexual Activity   Alcohol use: Yes    Alcohol/week:  0.0 standard drinks of alcohol    Comment: socially   Drug use: No   Sexual activity: Yes    Birth control/protection: I.U.D.  Other Topics Concern   Not on file  Social History Narrative   Not on file   Social Drivers of Health   Financial Resource Strain: Low Risk  (08/13/2023)   Overall Financial Resource Strain (CARDIA)    Difficulty of Paying Living Expenses: Not hard at all  Food Insecurity: No Food Insecurity (08/13/2023)   Hunger Vital Sign    Worried About Running Out of Food in the Last Year: Never true    Ran Out of Food in the Last Year: Never true  Transportation Needs: No Transportation Needs (08/13/2023)   PRAPARE - Administrator, Civil Service (Medical): No    Lack of Transportation (Non-Medical): No  Physical Activity: Unknown (08/13/2023)   Exercise Vital Sign    Days of Exercise per Week: 0 days    Minutes of Exercise per Session: Not on file  Stress: Stress Concern Present (08/13/2023)   [employer] of Occupational Health - Occupational Stress Questionnaire    Feeling of Stress : Rather much  Social Connections: Moderately Integrated (08/13/2023)   Social Connection and Isolation Panel [NHANES]    Frequency of Communication with Friends and Family: More than three times a week    Frequency of Social Gatherings with Friends and Family: Once a week    Attends Religious Services: 1 to 4 times per year    Active Member of [employer] or Organizations: No    Attends Engineer, Structural: Not on file    Marital Status: Married  Catering Manager Violence: Not on file    Outpatient Medications Prior to Visit  Medication Sig Dispense Refill   ALPRAZolam  (XANAX ) 1 MG tablet Take 1 tablet by mouth twice daily as needed for anxiety 60 tablet 3   EPINEPHrine  (EPIPEN  2-PAK) 0.3 mg/0.3 mL IJ SOAJ injection Inject 0.3 mg into the muscle as needed for anaphylaxis. 1 each 1   ipratropium (ATROVENT ) 0.06 % nasal spray Place 2 sprays into both  nostrils 3 (three) times daily. 15 mL 0   lansoprazole  (PREVACID ) 30 MG capsule Take 1 capsule (30 mg total) by mouth daily at 12 noon. 90 capsule 3   levonorgestrel  (MIRENA ) 20 MCG/24HR IUD 1 each by Intrauterine route once.     polyethylene glycol (MIRALAX  / GLYCOLAX ) 17 g packet Take 17 g by mouth daily.     citalopram  (CELEXA ) 40 MG tablet Take 1 tablet by mouth once daily 90 tablet 0   cyclobenzaprine  (FLEXERIL ) 5 MG tablet Take 1 tablet (5 mg total) by mouth at bedtime. 30 tablet 0   No facility-administered medications prior to visit.    Allergies  Allergen Reactions   Alpha-Gal Anaphylaxis, Nausea And Vomiting and Shortness Of Breath    ROS Review of Systems  Constitutional:  Positive for fatigue. Negative for chills and fever.  HENT:  Negative for congestion, sinus pressure, sinus pain and sore throat.   Eyes:  Negative for pain and discharge.  Respiratory:  Negative for cough and shortness of breath.   Cardiovascular:  Negative for chest pain and palpitations.  Gastrointestinal:  Positive for constipation. Negative for abdominal pain, nausea and vomiting.  Endocrine: Negative for polydipsia and polyuria.  Genitourinary:  Negative for dysuria and hematuria.  Musculoskeletal:  Positive for back pain, myalgias and neck pain. Negative for neck stiffness.  Skin:  Negative for rash.       Lesion over right upper chest wall area  Allergic/Immunologic: Positive for environmental allergies.  Neurological:  Negative for dizziness and weakness.  Psychiatric/Behavioral:  Negative for agitation and behavioral problems. The patient is nervous/anxious.       Objective:    Physical Exam Vitals reviewed.  Constitutional:      General: She is not in acute distress.    Appearance: She is not diaphoretic.  HENT:     Head: Normocephalic and atraumatic.     Nose: Nose normal.     Mouth/Throat:     Mouth: Mucous membranes are moist.  Eyes:     General: No scleral icterus.     Extraocular Movements: Extraocular movements intact.  Cardiovascular:     Rate and Rhythm: Normal rate and regular rhythm.     Pulses: Normal pulses.  Heart sounds: Normal heart sounds. No murmur heard. Pulmonary:     Breath sounds: Normal breath sounds. No wheezing or rales.  Musculoskeletal:     Right shoulder: Tenderness present.     Left shoulder: Tenderness present.     Cervical back: Neck supple. No tenderness.     Lumbar back: Tenderness present. Decreased range of motion.     Right lower leg: No edema.     Left lower leg: No edema.  Skin:    General: Skin is warm.     Findings: Lesion (Erythematous plaque over right upper chest wall area) present. No rash.  Neurological:     General: No focal deficit present.     Mental Status: She is alert and oriented to person, place, and time.     Sensory: No sensory deficit.     Motor: No weakness.  Psychiatric:        Mood and Affect: Mood normal.        Behavior: Behavior normal.     BP 122/84 (BP Location: Left Arm, Patient Position: Sitting, Cuff Size: Normal)   Pulse (!) 106   Ht 5' 6 (1.676 m)   Wt 166 lb (75.3 kg)   SpO2 98%   BMI 26.79 kg/m  Wt Readings from Last 3 Encounters:  08/14/23 166 lb (75.3 kg)  06/29/23 171 lb (77.6 kg)  06/22/23 166 lb 3.2 oz (75.4 kg)    Lab Results  Component Value Date   TSH 1.680 07/27/2022   Lab Results  Component Value Date   WBC 4.1 07/27/2022   HGB 14.8 07/27/2022   HCT 43.4 07/27/2022   MCV 89 07/27/2022   PLT 271 07/27/2022   Lab Results  Component Value Date   NA 140 07/27/2022   K 3.9 07/27/2022   CO2 24 07/27/2022   GLUCOSE 80 07/27/2022   BUN 9 07/27/2022   CREATININE 0.79 07/27/2022   BILITOT 0.5 07/27/2022   ALKPHOS 76 07/27/2022   AST 14 07/27/2022   ALT 16 07/27/2022   PROT 6.8 07/27/2022   ALBUMIN 4.3 07/27/2022   CALCIUM 9.2 07/27/2022   ANIONGAP 8 06/01/2022   EGFR 91 07/27/2022   Lab Results  Component Value Date   CHOL 161 07/27/2022    Lab Results  Component Value Date   HDL 35 (L) 07/27/2022   Lab Results  Component Value Date   LDLCALC 104 (H) 07/27/2022   Lab Results  Component Value Date   TRIG 120 07/27/2022   Lab Results  Component Value Date   CHOLHDL 4.6 (H) 07/27/2022   Lab Results  Component Value Date   HGBA1C 5.4 07/27/2022      Assessment & Plan:   Problem List Items Addressed This Visit       Musculoskeletal and Integument   Skin lesion of chest wall   Image in media section Considering her history of BCC of skin, referred to Dermatology      Relevant Orders   Ambulatory referral to Dermatology     Other   Generalized anxiety disorder (Chronic)   Well-controlled with Xanax  1 mg BID PRN and Celexa  Had switched to Cymbalta  for benefit with fibromyalgia, but did not tolerate it well      Relevant Medications   citalopram  (CELEXA ) 40 MG tablet   Other Relevant Orders   TSH   Fibromyalgia (Chronic)   Had Rheumatology evaluation in 2019, was told of fibromyalgia, but did not start any treatment at that time Had started  Cymbalta , but did not tolerate it If persistent symptoms or insomnia, may switch to Elavil  - she prefers to stay with Celexa  for now      Relevant Medications   citalopram  (CELEXA ) 40 MG tablet   Other Relevant Orders   TSH   CMP14+EGFR   CBC with Differential/Platelet   Mixed hyperlipidemia   Check lipid profile Advised to follow low-carb and low-cholesterol diet for now With weight loss from GLP-1 agonist, her lipid profile had improved as well      Relevant Orders   Lipid panel   Encounter for general adult medical examination with abnormal findings - Primary   Physical exam as documented. Fasting blood tests today. Mammography and US  [facility] breast at [facility]. Gyn. clinic.      Other Visit Diagnoses       Hyperglycemia       Relevant Orders   Hemoglobin A1c     Vitamin D  deficiency       Relevant Orders   VITAMIN D  25 Hydroxy (Vit-D Deficiency,  Fractures)     History of basal cell carcinoma       Relevant Orders   Ambulatory referral to Dermatology        Meds ordered this encounter  Medications   citalopram  (CELEXA ) 40 MG tablet    Sig: Take 1 tablet (40 mg total) by mouth daily.    Dispense:  90 tablet    Refill:  3    Follow-up: Return in about 4 months (around 12/12/2023).    Suzzane MARLA Blanch, MD

## 2023-08-14 NOTE — Assessment & Plan Note (Signed)
Image in media section Considering her history of BCC of skin, referred to Dermatology

## 2023-08-15 LAB — HEMOGLOBIN A1C
Est. average glucose Bld gHb Est-mCnc: 94 mg/dL
Hgb A1c MFr Bld: 4.9 % (ref 4.8–5.6)

## 2023-08-15 LAB — CMP14+EGFR
ALT: 9 [IU]/L (ref 0–32)
AST: 15 [IU]/L (ref 0–40)
Albumin: 4.5 g/dL (ref 3.8–4.9)
Alkaline Phosphatase: 65 [IU]/L (ref 44–121)
BUN/Creatinine Ratio: 18 (ref 9–23)
BUN: 13 mg/dL (ref 6–24)
Bilirubin Total: 0.4 mg/dL (ref 0.0–1.2)
CO2: 23 mmol/L (ref 20–29)
Calcium: 9 mg/dL (ref 8.7–10.2)
Chloride: 101 mmol/L (ref 96–106)
Creatinine, Ser: 0.71 mg/dL (ref 0.57–1.00)
Globulin, Total: 2.4 g/dL (ref 1.5–4.5)
Glucose: 79 mg/dL (ref 70–99)
Potassium: 4.4 mmol/L (ref 3.5–5.2)
Sodium: 138 mmol/L (ref 134–144)
Total Protein: 6.9 g/dL (ref 6.0–8.5)
eGFR: 103 mL/min/{1.73_m2} (ref >59)

## 2023-08-15 LAB — VITAMIN D 25 HYDROXY (VIT D DEFICIENCY, FRACTURES): Vit D, 25-Hydroxy: 35.3 ng/mL (ref 30.0–100.0)

## 2023-08-15 LAB — CBC WITH DIFFERENTIAL/PLATELET
Basophils Absolute: 0.1 10*3/uL (ref 0.0–0.2)
Basos: 1 %
EOS (ABSOLUTE): 0.2 10*3/uL (ref 0.0–0.4)
Eos: 3 %
Hematocrit: 41.1 % (ref 34.0–46.6)
Hemoglobin: 13.5 g/dL (ref 11.1–15.9)
Immature Grans (Abs): 0 10*3/uL (ref 0.0–0.1)
Immature Granulocytes: 0 %
Lymphocytes Absolute: 1.4 10*3/uL (ref 0.7–3.1)
Lymphs: 19 %
MCH: 31.1 pg (ref 26.6–33.0)
MCHC: 32.8 g/dL (ref 31.5–35.7)
MCV: 95 fL (ref 79–97)
Monocytes Absolute: 0.4 10*3/uL (ref 0.1–0.9)
Monocytes: 5 %
Neutrophils Absolute: 5.4 10*3/uL (ref 1.4–7.0)
Neutrophils: 72 %
Platelets: 287 10*3/uL (ref 150–450)
RBC: 4.34 x10E6/uL (ref 3.77–5.28)
RDW: 12.1 % (ref 11.7–15.4)
WBC: 7.5 10*3/uL (ref 3.4–10.8)

## 2023-08-15 LAB — LIPID PANEL
Chol/HDL Ratio: 3.2 {ratio} (ref 0.0–4.4)
Cholesterol, Total: 200 mg/dL — ABNORMAL HIGH (ref 100–199)
HDL: 63 mg/dL (ref >39)
LDL Chol Calc (NIH): 118 mg/dL — ABNORMAL HIGH (ref 0–99)
Triglycerides: 105 mg/dL (ref 0–149)
VLDL Cholesterol Cal: 19 mg/dL (ref 5–40)

## 2023-08-15 LAB — TSH: TSH: 1.61 u[IU]/mL (ref 0.450–4.500)

## 2023-09-04 ENCOUNTER — Other Ambulatory Visit (HOSPITAL_COMMUNITY): Payer: Self-pay

## 2023-09-12 ENCOUNTER — Other Ambulatory Visit: Payer: Self-pay

## 2023-09-13 ENCOUNTER — Other Ambulatory Visit (HOSPITAL_COMMUNITY): Payer: Self-pay

## 2023-09-13 ENCOUNTER — Encounter: Payer: Self-pay | Admitting: Internal Medicine

## 2023-09-13 ENCOUNTER — Other Ambulatory Visit: Payer: Self-pay | Admitting: Internal Medicine

## 2023-09-13 DIAGNOSIS — U071 COVID-19: Secondary | ICD-10-CM

## 2023-09-13 MED ORDER — NIRMATRELVIR/RITONAVIR (PAXLOVID)TABLET: 3.0000 | ORAL_TABLET | Freq: Two times a day (BID) | ORAL | 0 refills | Status: AC

## 2023-09-14 ENCOUNTER — Telehealth: Payer: Self-pay | Admitting: Internal Medicine

## 2023-09-14 NOTE — Telephone Encounter (Unsigned)
Copied from CRM [id number]. Topic: Clinical - Prescription Issue >> Sep 13, 2023  2:31 PM [facility] R wrote: Reason for CRM: Patient called about medication listed below. Stated the pharmacy said it was $1400 out of pocket, patient wondering why when have insurance. If not can Dr. Allena Katz send in a different medication that will do the same thing just covered in th insurance.

## 2023-09-19 ENCOUNTER — Encounter: Payer: Self-pay | Admitting: Internal Medicine

## 2023-10-06 ENCOUNTER — Other Ambulatory Visit: Payer: Self-pay | Admitting: Internal Medicine

## 2023-10-06 DIAGNOSIS — F411 Generalized anxiety disorder: Secondary | ICD-10-CM

## 2023-10-08 ENCOUNTER — Encounter: Payer: Self-pay | Admitting: Internal Medicine

## 2023-10-09 ENCOUNTER — Ambulatory Visit: Payer: 59 | Admitting: Dermatology

## 2023-10-12 ENCOUNTER — Other Ambulatory Visit (HOSPITAL_COMMUNITY): Payer: Self-pay

## 2023-10-12 ENCOUNTER — Other Ambulatory Visit: Payer: Self-pay

## 2023-10-12 MED ORDER — PANTOPRAZOLE SODIUM 40 MG PO TBEC
40.0000 mg | DELAYED_RELEASE_TABLET | Freq: Every day | ORAL | 3 refills | Status: AC
  Filled 2023-10-12: qty 90, 90d supply, fill #0
  Filled 2024-01-12: qty 90, 90d supply, fill #1
  Filled 2024-04-17: qty 90, 90d supply, fill #2
  Filled 2024-07-17: qty 90, 90d supply, fill #3

## 2023-10-24 ENCOUNTER — Other Ambulatory Visit: Payer: Self-pay | Admitting: Internal Medicine

## 2023-10-24 ENCOUNTER — Encounter: Payer: Self-pay | Admitting: Internal Medicine

## 2023-10-24 ENCOUNTER — Other Ambulatory Visit: Payer: Self-pay

## 2023-10-24 ENCOUNTER — Other Ambulatory Visit (HOSPITAL_COMMUNITY): Payer: Self-pay

## 2023-10-24 DIAGNOSIS — J302 Other seasonal allergic rhinitis: Secondary | ICD-10-CM

## 2023-10-24 MED ORDER — FLUTICASONE PROPIONATE 50 MCG/ACT NA SUSP
2.0000 | Freq: Every day | NASAL | 3 refills | Status: AC
  Filled 2023-10-24: qty 16, 30d supply, fill #0
  Filled 2023-11-19: qty 16, 30d supply, fill #1

## 2023-11-01 ENCOUNTER — Other Ambulatory Visit: Payer: Self-pay | Admitting: Oncology

## 2023-11-01 MED ORDER — DOXYCYCLINE HYCLATE 100 MG PO TABS: 100.0000 mg | ORAL_TABLET | Freq: Two times a day (BID) | ORAL | 0 refills | Status: DC

## 2023-11-01 MED ORDER — PREDNISONE 10 MG (21) PO TBPK: ORAL_TABLET | ORAL | 0 refills | Status: DC

## 2023-11-02 ENCOUNTER — Encounter: Payer: Self-pay | Admitting: Internal Medicine

## 2023-11-02 ENCOUNTER — Other Ambulatory Visit: Payer: Self-pay | Admitting: Internal Medicine

## 2023-11-02 DIAGNOSIS — R002 Palpitations: Secondary | ICD-10-CM

## 2023-11-20 ENCOUNTER — Other Ambulatory Visit (HOSPITAL_COMMUNITY): Payer: Self-pay

## 2023-11-29 ENCOUNTER — Ambulatory Visit: Payer: 59 | Admitting: Dermatology

## 2023-12-14 ENCOUNTER — Ambulatory Visit: Payer: 59 | Admitting: Internal Medicine

## 2023-12-21 ENCOUNTER — Encounter (HOSPITAL_COMMUNITY): Payer: Self-pay

## 2023-12-28 ENCOUNTER — Ambulatory Visit (INDEPENDENT_AMBULATORY_CARE_PROVIDER_SITE_OTHER): Admitting: Internal Medicine

## 2023-12-28 ENCOUNTER — Encounter: Payer: Self-pay | Admitting: Internal Medicine

## 2023-12-28 ENCOUNTER — Ambulatory Visit: Attending: Internal Medicine

## 2023-12-28 VITALS — BP 127/85 | HR 90 | Ht 66.0 in | Wt 162.0 lb

## 2023-12-28 DIAGNOSIS — M797 Fibromyalgia: Secondary | ICD-10-CM

## 2023-12-28 DIAGNOSIS — E782 Mixed hyperlipidemia: Secondary | ICD-10-CM | POA: Diagnosis not present

## 2023-12-28 DIAGNOSIS — R002 Palpitations: Secondary | ICD-10-CM | POA: Diagnosis not present

## 2023-12-28 DIAGNOSIS — F411 Generalized anxiety disorder: Secondary | ICD-10-CM | POA: Diagnosis not present

## 2023-12-28 DIAGNOSIS — Z91018 Allergy to other foods: Secondary | ICD-10-CM

## 2023-12-28 NOTE — Assessment & Plan Note (Signed)
 [place] Also reports recent history of gluten insensitivity, getting desensitization treatment at allergy clinic

## 2023-12-28 NOTE — Assessment & Plan Note (Signed)
 Due to her report of irregular heartbeat, concerning for A-fib Her father has A-fib as well Awaiting cardiology evaluation Ordered Zio patch monitor

## 2023-12-28 NOTE — Assessment & Plan Note (Signed)
 Checked lipid profile Advised to follow heart healthy diet for now With weight loss from GLP-1 agonist, her lipid profile had improved as well

## 2023-12-28 NOTE — Progress Notes (Signed)
 Established Patient Office Visit  Subjective:  Patient ID: Theresa [name], female    DOB: 1971-06-07  Age: 53 y.o. MRN: [id number]  CC:  Chief Complaint  Patient presents with   Medical Management of Chronic Issues    4 month f/u    HPI Theresa [name] is a 53 y.o. female with past medical history of GERD, chronic rhinitis, GAD, fibromyalgia and prediabetes who presents for f/u of her chronic medical conditions.  She has history of fibromyalgia. Her myalgias have improved with weight loss. She also started using Bonsai supplement, which has improved her fatigue as well. She was given trial of Cymbalta , but she did not tolerate it.  She has tried massage therapy with mild relief. She has chronic low back pain, denies any numbness or tingling of LE.  Denies saddle anesthesia, urinary or stool incontinence.  She has lost about 4 lbs since the last visit. She has stopped taking semaglutide  formulation since 03/25, was getting it through [facility] clinic. Her nausea and vomiting have resolved now.  She has been taking Celexa  and Xanax  as needed for anxiety. She had to take Xanax  BID on some days for insomnia and GAD as well. Her sleep has improved now.  Palpitations: She had an episode of palpitations about a month ago, which lasted for about 4 hours midnight.  She reports episodes of fatigue and uneasiness a week prior to that as well.  She has had intermittent spells of palpitations since then.  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 She has been referred to Cardiology for further evaluation.   Past Medical History:  Diagnosis Date   Allergy    Allergy to alpha-gal    Anxiety    Colon adenoma DEC 2014   ONE(7 MM) SIMPLE([place])   Concussion with loss of consciousness 02/17/2019   Depression    Edema    GERD (gastroesophageal reflux disease)    History of abnormal cervical Pap smear 12/02/2013   History of  nephrolithiasis    HPV (human papilloma virus) infection    Hyperlipidemia    IBS (irritable bowel syndrome)    constipation   Insomnia    Lymphadenopathy 07/12/2020   Obesity 01/14/2014   Positive test for Epstein-Barr virus (EBV) 08/21/2017   Pre-diabetes 09/2014   Superficial basal cell carcinoma (BCC) 12/26/2019   Right Breast- treatment Imiquimoid   Yeast infection 09/04/2013    Past Surgical History:  Procedure Laterality Date   BIOPSY N/A 08/01/2013   Procedure: BIOPSY;  Surgeon: Alyce Jubilee, MD;  [facility];  Service: Endoscopy;  Laterality: N/A;  FOR MICROSCOPIC COLITIS   BIOPSY  08/09/2018   Procedure: BIOPSY;  Surgeon: Ruby Corporal, MD;  [facility];  Service: Endoscopy;;  gastric   BREAST REDUCTION SURGERY  2009   BREAST SURGERY  2007   breast reduction   CHOLECYSTECTOMY N/A 11/04/2014   Procedure: LAPAROSCOPIC CHOLECYSTECTOMY;  Surgeon: Alanda Allegra Md, MD;  Location: [facility];  Service: General;  Laterality: N/A;   COLONOSCOPY  Oct 2009   Dr. Willy Harvest: int/ext hemorrhoids, ?mild proctitis but path benign, likely r/t irritation from bowel prep   COLONOSCOPY N/A 08/01/2013   Procedure: COLONOSCOPY;  Surgeon: Alyce Jubilee, MD;  [facility];  Service: Endoscopy;  Laterality: N/A;  12:00   COLONOSCOPY WITH PROPOFOL  N/A 08/09/2018   Procedure: COLONOSCOPY WITH PROPOFOL ;  Surgeon: Homero Luster,  Mathews Solomons, MD;  [facility];  Service: Endoscopy;  Laterality: N/A;  730   COLONOSCOPY WITH PROPOFOL  N/A 03/15/2023   Procedure: COLONOSCOPY WITH PROPOFOL ;  Surgeon: Vinetta Greening, DO;  [facility];  Service: Endoscopy;  Laterality: N/A;  10:30 am, asa 2   ESOPHAGOGASTRODUODENOSCOPY  Dec 2009   Dr. Willy Harvest: reflux esophagitis, proximal gastric polyps, benign   ESOPHAGOGASTRODUODENOSCOPY N/A 12/18/2014   Procedure: ESOPHAGOGASTRODUODENOSCOPY (EGD);  Surgeon: Ruby Corporal, MD;  [facility];  Service: Endoscopy;   Laterality: N/A;  230   ESOPHAGOGASTRODUODENOSCOPY (EGD) WITH PROPOFOL  N/A 08/09/2018   Procedure: ESOPHAGOGASTRODUODENOSCOPY (EGD) WITH PROPOFOL ;  Surgeon: Ruby Corporal, MD;  [facility];  Service: Endoscopy;  Laterality: N/A;   ESOPHAGOGASTRODUODENOSCOPY (EGD) WITH PROPOFOL  N/A 03/15/2023   Procedure: ESOPHAGOGASTRODUODENOSCOPY (EGD) WITH PROPOFOL ;  Surgeon: Vinetta Greening, DO;  [facility];  Service: Endoscopy;  Laterality: N/A;   HEMORRHOID BANDING N/A 08/01/2013   Procedure: HEMORRHOID BANDING;  Surgeon: Alyce Jubilee, MD;  [facility];  Service: Endoscopy;  Laterality: N/A;  internal hemorrhoid banding   LIVER BIOPSY N/A 11/04/2014   Procedure: LIVER BIOPSY;  Surgeon: Alanda Allegra Md, MD;  [facility];  Service: General;  Laterality: N/A;   POLYPECTOMY  2015   Dr.Fields   POLYPECTOMY  08/09/2018   Procedure: POLYPECTOMY;  Surgeon: Ruby Corporal, MD;  [facility];  Service: Endoscopy;;  colon   POLYPECTOMY  03/15/2023   Procedure: POLYPECTOMY;  Surgeon: Vinetta Greening, DO;  [facility];  Service: Endoscopy;;  gastric/ascending colon   REDUCTION MAMMAPLASTY Bilateral 2006   UPPER GASTROINTESTINAL ENDOSCOPY  2006   Dr. Rosalea Collin in [place]    Family History  Problem Relation Age of Onset   Cancer Mother        head and neck   Hypertension Father    Hyperlipidemia Father    Pancreatic cancer Maternal Grandfather    Colon cancer Neg Hx     Social History   Socioeconomic History   Marital status: Married    Spouse name: Not on file   Number of children: 1   Years of education: Not on file   Highest education level: Associate degree: academic program  Occupational History   Occupation: Teacher, adult education: [employer]  Tobacco Use   Smoking status: Never   Smokeless tobacco: Never   Tobacco comments:    Never smoker  Vaping Use   Vaping status: Never Used  Substance and Sexual Activity   Alcohol  use: Yes    Alcohol/week: 0.0 standard drinks of alcohol    Comment: socially   Drug use: No   Sexual activity: Yes    Birth control/protection: I.U.D.  Other Topics Concern   Not on file  Social History Narrative   Not on file   Social Drivers of Health   Financial Resource Strain: Low Risk  (08/13/2023)   Overall Financial Resource Strain (CARDIA)    Difficulty of Paying Living Expenses: Not hard at all  Food Insecurity: No Food Insecurity (08/13/2023)   Hunger Vital Sign    Worried About Running Out of Food in the Last Year: Never true    Ran Out of Food in the Last Year: Never true  Transportation Needs: No Transportation Needs (08/13/2023)   PRAPARE - Administrator, Civil Service (Medical): No    Lack of Transportation (Non-Medical): No  Physical Activity:  Unknown (08/13/2023)   Exercise Vital Sign    Days of Exercise per Week: 0 days    Minutes of Exercise per Session: Not on file  Stress: Stress Concern Present (08/13/2023)   [employer] of Occupational Health - Occupational Stress Questionnaire    Feeling of Stress : Rather much  Social Connections: Moderately Integrated (08/13/2023)   Social Connection and Isolation Panel [NHANES]    Frequency of Communication with Friends and Family: More than three times a week    Frequency of Social Gatherings with Friends and Family: Once a week    Attends Religious Services: 1 to 4 times per year    Active Member of [employer] or Organizations: No    Attends Engineer, structural: Not on file    Marital Status: Married  Catering manager Violence: Not on file    Outpatient Medications Prior to Visit  Medication Sig Dispense Refill   ALPRAZolam  (XANAX ) 1 MG tablet Take 1 tablet by mouth twice daily as needed for anxiety 60 tablet 3   citalopram  (CELEXA ) 40 MG tablet Take 1 tablet (40 mg total) by mouth daily. 90 tablet 3   EPINEPHrine  (EPIPEN  2-PAK) 0.3 mg/0.3 mL IJ SOAJ injection Inject 0.3 mg into the  muscle as needed for anaphylaxis. 1 each 1   fluticasone  (FLONASE ) 50 MCG/ACT nasal spray Place 2 sprays into both nostrils daily. 16 g 3   ipratropium (ATROVENT ) 0.06 % nasal spray Place 2 sprays into both nostrils 3 (three) times daily. 15 mL 0   levonorgestrel  (MIRENA ) 20 MCG/24HR IUD 1 each by Intrauterine route once.     pantoprazole  (PROTONIX ) 40 MG tablet Take 1 tablet (40 mg total) by mouth daily before breakfast. 90 tablet 3   polyethylene glycol (MIRALAX  / GLYCOLAX ) 17 g packet Take 17 g by mouth daily.     doxycycline  (VIBRA -TABS) 100 MG tablet Take 1 tablet (100 mg total) by mouth 2 (two) times daily. 20 tablet 0   predniSONE  (STERAPRED UNI-PAK 21 TAB) 10 MG (21) TBPK tablet Take as directed. 21 tablet 0   No facility-administered medications prior to visit.    Allergies  Allergen Reactions   Alpha-Gal Anaphylaxis, Nausea And Vomiting and Shortness Of Breath   Gluten Meal     Constipation/fatigue.    ROS Review of Systems  Constitutional:  Positive for fatigue. Negative for chills and fever.  HENT:  Negative for congestion, sinus pressure, sinus pain and sore throat.   Eyes:  Negative for pain and discharge.  Respiratory:  Negative for cough and shortness of breath.   Cardiovascular:  Positive for palpitations. Negative for chest pain.  Gastrointestinal:  Positive for constipation. Negative for abdominal pain, nausea and vomiting.  Endocrine: Negative for polydipsia and polyuria.  Genitourinary:  Negative for dysuria and hematuria.  Musculoskeletal:  Positive for back pain, myalgias and neck pain. Negative for neck stiffness.  Skin:  Negative for rash.       Lesion over right upper chest wall area  Allergic/Immunologic: Positive for environmental allergies.  Neurological:  Negative for dizziness and weakness.  Psychiatric/Behavioral:  Negative for agitation and behavioral problems. The patient is nervous/anxious.       Objective:    Physical Exam Vitals reviewed.   Constitutional:      General: She is not in acute distress.    Appearance: She is not diaphoretic.  HENT:     Head: Normocephalic and atraumatic.     Nose: Nose normal.     Mouth/Throat:  Mouth: Mucous membranes are moist.  Eyes:     General: No scleral icterus.    Extraocular Movements: Extraocular movements intact.  Cardiovascular:     Rate and Rhythm: Normal rate and regular rhythm.     Heart sounds: Normal heart sounds. No murmur heard. Pulmonary:     Breath sounds: Normal breath sounds. No wheezing or rales.  Musculoskeletal:     Cervical back: Neck supple. No tenderness.     Right lower leg: No edema.     Left lower leg: No edema.  Skin:    General: Skin is warm.     Findings: No rash.  Neurological:     General: No focal deficit present.     Mental Status: She is alert and oriented to person, place, and time.     Sensory: No sensory deficit.     Motor: No weakness.  Psychiatric:        Mood and Affect: Mood normal.        Behavior: Behavior normal.     BP 127/85   Pulse 90   Ht 5\' 6"  (1.676 m)   Wt 162 lb (73.5 kg)   SpO2 97%   BMI 26.15 kg/m  Wt Readings from Last 3 Encounters:  12/28/23 162 lb (73.5 kg)  08/14/23 166 lb (75.3 kg)  06/29/23 171 lb (77.6 kg)    Lab Results  Component Value Date   TSH 1.610 08/14/2023   Lab Results  Component Value Date   WBC 7.5 08/14/2023   HGB 13.5 08/14/2023   HCT 41.1 08/14/2023   MCV 95 08/14/2023   PLT 287 08/14/2023   Lab Results  Component Value Date   NA 138 08/14/2023   K 4.4 08/14/2023   CO2 23 08/14/2023   GLUCOSE 79 08/14/2023   BUN 13 08/14/2023   CREATININE 0.71 08/14/2023   BILITOT 0.4 08/14/2023   ALKPHOS 65 08/14/2023   AST 15 08/14/2023   ALT 9 08/14/2023   PROT 6.9 08/14/2023   ALBUMIN 4.5 08/14/2023   CALCIUM 9.0 08/14/2023   ANIONGAP 8 06/01/2022   EGFR 103 08/14/2023   Lab Results  Component Value Date   CHOL 200 (H) 08/14/2023   Lab Results  Component Value Date    HDL 63 08/14/2023   Lab Results  Component Value Date   LDLCALC 118 (H) 08/14/2023   Lab Results  Component Value Date   TRIG 105 08/14/2023   Lab Results  Component Value Date   CHOLHDL 3.2 08/14/2023   Lab Results  Component Value Date   HGBA1C 4.9 08/14/2023      Assessment & Plan:   Problem List Items Addressed This Visit       Other   Generalized anxiety disorder - Primary (Chronic)   Well-controlled with Xanax  1 mg BID PRN and Celexa  Had switched to Cymbalta  for benefit with fibromyalgia, but did not tolerate it well      Fibromyalgia (Chronic)   Had Rheumatology evaluation in 2019, was told of fibromyalgia, but did not start any treatment at that time Had started Cymbalta , but did not tolerate it If persistent symptoms or insomnia, may switch to Elavil  - she prefers to stay with Celexa  for now      Allergy to alpha-gal   [place] allergy clinic [place] Also reports recent history of gluten insensitivity, getting desensitization treatment at allergy clinic      Mixed hyperlipidemia   Checked lipid profile Advised to follow heart healthy  diet for now With weight loss from GLP-1 agonist, her lipid profile had improved as well      Heart palpitations   Due to her report of irregular heartbeat, concerning for A-fib Her father has A-fib as well Awaiting cardiology evaluation Ordered Zio patch monitor      Relevant Orders   LONG TERM MONITOR (3-14 DAYS)      No orders of the defined types were placed in this encounter.   Follow-up: Return in about 6 months (around 06/29/2024).    Meldon Sport, MD

## 2023-12-28 NOTE — Progress Notes (Unsigned)
 EP to read.

## 2023-12-28 NOTE — Assessment & Plan Note (Signed)
 Had Rheumatology evaluation in 2019, was told of fibromyalgia, but did not start any treatment at that time Had started Cymbalta, but did not tolerate it If persistent symptoms or insomnia, may switch to Elavil - she prefers to stay with Celexa for now

## 2023-12-28 NOTE — Patient Instructions (Addendum)
 Please continue to take medications as prescribed.  Please continue to follow heart healthy diet and perform moderate exercise/walking at least 150 mins/week.

## 2023-12-28 NOTE — Assessment & Plan Note (Signed)
Well-controlled with Xanax 1 mg BID PRN and Celexa Had switched to Cymbalta for benefit with fibromyalgia, but did not tolerate it well

## 2024-01-07 ENCOUNTER — Encounter: Payer: Self-pay | Admitting: Internal Medicine

## 2024-01-12 ENCOUNTER — Other Ambulatory Visit (HOSPITAL_COMMUNITY): Payer: Self-pay

## 2024-01-12 ENCOUNTER — Encounter: Payer: Self-pay | Admitting: Internal Medicine

## 2024-01-21 ENCOUNTER — Other Ambulatory Visit: Payer: Self-pay | Admitting: Internal Medicine

## 2024-01-21 ENCOUNTER — Other Ambulatory Visit: Payer: Self-pay

## 2024-01-21 DIAGNOSIS — E6609 Other obesity due to excess calories: Secondary | ICD-10-CM

## 2024-01-21 MED ORDER — WEGOVY 0.25 MG/0.5ML ~~LOC~~ SOAJ
0.2500 mg | SUBCUTANEOUS | 0 refills | Status: DC
  Filled 2024-01-21: qty 2, 28d supply, fill #0

## 2024-01-21 NOTE — Addendum Note (Signed)
 Addended byCleola Dach on: 01/21/2024 12:15 PM   Modules accepted: Orders

## 2024-01-25 ENCOUNTER — Other Ambulatory Visit: Payer: Self-pay

## 2024-01-25 ENCOUNTER — Other Ambulatory Visit (HOSPITAL_COMMUNITY): Payer: Self-pay

## 2024-01-25 DIAGNOSIS — Z1231 Encounter for screening mammogram for malignant neoplasm of breast: Secondary | ICD-10-CM | POA: Diagnosis not present

## 2024-01-25 DIAGNOSIS — Z01419 Encounter for gynecological examination (general) (routine) without abnormal findings: Secondary | ICD-10-CM | POA: Diagnosis not present

## 2024-01-25 DIAGNOSIS — Z6826 Body mass index (BMI) 26.0-26.9, adult: Secondary | ICD-10-CM | POA: Diagnosis not present

## 2024-01-25 DIAGNOSIS — Z124 Encounter for screening for malignant neoplasm of cervix: Secondary | ICD-10-CM | POA: Diagnosis not present

## 2024-01-25 LAB — HM MAMMOGRAPHY

## 2024-01-25 MED ORDER — PODOFILOX 0.5 % EX SOLN
CUTANEOUS | 2 refills | Status: DC
  Filled 2024-01-25: qty 3.5, 7d supply, fill #0

## 2024-01-26 ENCOUNTER — Other Ambulatory Visit: Payer: Self-pay

## 2024-01-28 ENCOUNTER — Other Ambulatory Visit: Payer: Self-pay

## 2024-01-28 ENCOUNTER — Encounter: Payer: Self-pay | Admitting: Pharmacist

## 2024-01-29 DIAGNOSIS — R002 Palpitations: Secondary | ICD-10-CM | POA: Diagnosis not present

## 2024-01-31 ENCOUNTER — Other Ambulatory Visit: Payer: Self-pay

## 2024-02-03 DIAGNOSIS — R002 Palpitations: Secondary | ICD-10-CM | POA: Diagnosis not present

## 2024-02-04 ENCOUNTER — Ambulatory Visit: Payer: Self-pay | Admitting: Internal Medicine

## 2024-02-10 ENCOUNTER — Other Ambulatory Visit: Payer: Self-pay | Admitting: Internal Medicine

## 2024-02-10 DIAGNOSIS — F411 Generalized anxiety disorder: Secondary | ICD-10-CM

## 2024-03-03 ENCOUNTER — Ambulatory Visit: Admitting: Dermatology

## 2024-03-06 ENCOUNTER — Other Ambulatory Visit (HOSPITAL_COMMUNITY): Payer: Self-pay

## 2024-03-26 ENCOUNTER — Ambulatory Visit: Attending: Cardiology | Admitting: Cardiology

## 2024-03-26 ENCOUNTER — Encounter: Payer: Self-pay | Admitting: Cardiology

## 2024-03-26 VITALS — BP 120/90 | HR 80 | Ht 66.0 in | Wt 161.0 lb

## 2024-03-26 DIAGNOSIS — R002 Palpitations: Secondary | ICD-10-CM | POA: Diagnosis not present

## 2024-03-26 NOTE — Progress Notes (Signed)
 Clinical Summary Theresa [name] is a 53 y.o.female seen as a new patient for the following medical problems.  1.Palpitations - prevsiously seen in 2018 by Dr Debera, monitor at that time was benign 01/2024 monitor: 2 episodes NSVT longest 6 beats, 9 runs SVT longest 7 beats. Symptoms correlated with NSR - father with history of afib  - episode of palpitations a few months ago. Checked heart rates, up to 150s. On and off symptoms for about 5 hours, eventually self resolved. Since that time denies any significant episodes. - limited caffeine.      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[employer]   Past Medical History:  Diagnosis Date   Allergy    Allergy to alpha-gal    Anxiety    Colon adenoma DEC 2014   ONE(7 MM) SIMPLE([place])   Concussion with loss of consciousness 02/17/2019   Depression    Edema    GERD (gastroesophageal reflux disease)    History of abnormal cervical Pap smear 12/02/2013   History of nephrolithiasis    HPV (human papilloma virus) infection    Hyperlipidemia    IBS (irritable bowel syndrome)    constipation   Insomnia    Lymphadenopathy 07/12/2020   Obesity 01/14/2014   Positive test for Epstein-Barr virus (EBV) 08/21/2017   Pre-diabetes 09/2014   Superficial basal cell carcinoma (BCC) 12/26/2019   Right Breast- treatment Imiquimoid   Yeast infection 09/04/2013     Allergies  Allergen Reactions   Alpha-Gal Anaphylaxis, Nausea And Vomiting and Shortness Of Breath   Gluten Meal     Constipation/fatigue.     Current Outpatient Medications  Medication Sig Dispense Refill   ALPRAZolam  (XANAX ) 1 MG tablet Take 1 tablet by mouth twice daily as needed for anxiety 60 tablet 4   citalopram  (CELEXA ) 40 MG tablet Take 1 tablet (40 mg total) by mouth daily. 90 tablet 3   EPINEPHrine  (EPIPEN  2-PAK) 0.3 mg/0.3 mL IJ SOAJ injection Inject 0.3 mg into the muscle as needed for anaphylaxis. 1 each 1   fluticasone  (FLONASE ) 50 MCG/ACT nasal spray Place 2 sprays into both  nostrils daily. 16 g 3   ipratropium (ATROVENT ) 0.06 % nasal spray Place 2 sprays into both nostrils 3 (three) times daily. 15 mL 0   levonorgestrel  (MIRENA ) 20 MCG/24HR IUD 1 each by Intrauterine route once.     pantoprazole  (PROTONIX ) 40 MG tablet Take 1 tablet (40 mg total) by mouth daily before breakfast. 90 tablet 3   podofilox  (CONDYLOX ) 0.5 % external solution APPLY BY TOPICAL ROUTE 2 TIMES PER DAY FOR 3 DAYS THEN STOP FOR 4 DAYS. (REPEAT 7DAY CYCLE UNTIL NO VISIBLE WART TISSUE/MAX OF FOUR CYCLES) 3.5 mL 2   polyethylene glycol (MIRALAX  / GLYCOLAX ) 17 g packet Take 17 g by mouth daily.     No current facility-administered medications for this visit.     Past Surgical History:  Procedure Laterality Date   BIOPSY N/A 08/01/2013   Procedure: BIOPSY;  Surgeon: Margo LITTIE Haddock, MD;  [facility];  Service: Endoscopy;  Laterality: N/A;  FOR MICROSCOPIC COLITIS   BIOPSY  08/09/2018   Procedure: BIOPSY;  Surgeon: Golda Claudis PENNER, MD;  [facility];  Service: Endoscopy;;  gastric   BREAST REDUCTION SURGERY  2009   BREAST SURGERY  2007   breast reduction   CHOLECYSTECTOMY N/A 11/04/2014   Procedure: LAPAROSCOPIC CHOLECYSTECTOMY;  Surgeon: Oneil Budge Md, MD;  [facility];  Service: General;  Laterality: N/A;  COLONOSCOPY  Oct 2009   Dr. Avram: int/ext hemorrhoids, ?mild proctitis but path benign, likely r/t irritation from bowel prep   COLONOSCOPY N/A 08/01/2013   Procedure: COLONOSCOPY;  Surgeon: Margo LITTIE Haddock, MD;  [facility];  Service: Endoscopy;  Laterality: N/A;  12:00   COLONOSCOPY WITH PROPOFOL  N/A 08/09/2018   Procedure: COLONOSCOPY WITH PROPOFOL ;  Surgeon: Golda Claudis PENNER, MD;  [facility];  Service: Endoscopy;  Laterality: N/A;  730   COLONOSCOPY WITH PROPOFOL  N/A 03/15/2023   Procedure: COLONOSCOPY WITH PROPOFOL ;  Surgeon: Cindie Carlin POUR, DO;  [facility];  Service: Endoscopy;  Laterality: N/A;  10:30 am, asa 2    ESOPHAGOGASTRODUODENOSCOPY  Dec 2009   Dr. Avram: reflux esophagitis, proximal gastric polyps, benign   ESOPHAGOGASTRODUODENOSCOPY N/A 12/18/2014   Procedure: ESOPHAGOGASTRODUODENOSCOPY (EGD);  Surgeon: Claudis PENNER Golda, MD;  [facility];  Service: Endoscopy;  Laterality: N/A;  230   ESOPHAGOGASTRODUODENOSCOPY (EGD) WITH PROPOFOL  N/A 08/09/2018   Procedure: ESOPHAGOGASTRODUODENOSCOPY (EGD) WITH PROPOFOL ;  Surgeon: Golda Claudis PENNER, MD;  [facility];  Service: Endoscopy;  Laterality: N/A;   ESOPHAGOGASTRODUODENOSCOPY (EGD) WITH PROPOFOL  N/A 03/15/2023   Procedure: ESOPHAGOGASTRODUODENOSCOPY (EGD) WITH PROPOFOL ;  Surgeon: Cindie Carlin POUR, DO;  [facility];  Service: Endoscopy;  Laterality: N/A;   HEMORRHOID BANDING N/A 08/01/2013   Procedure: HEMORRHOID BANDING;  Surgeon: Margo LITTIE Haddock, MD;  [facility];  Service: Endoscopy;  Laterality: N/A;  internal hemorrhoid banding   LIVER BIOPSY N/A 11/04/2014   Procedure: LIVER BIOPSY;  Surgeon: Oneil Budge Md, MD;  [facility];  Service: General;  Laterality: N/A;   POLYPECTOMY  2015   Dr.Fields   POLYPECTOMY  08/09/2018   Procedure: POLYPECTOMY;  Surgeon: Golda Claudis PENNER, MD;  [facility];  Service: Endoscopy;;  colon   POLYPECTOMY  03/15/2023   Procedure: POLYPECTOMY;  Surgeon: Cindie Carlin POUR, DO;  [facility];  Service: Endoscopy;;  gastric/ascending colon   REDUCTION MAMMAPLASTY Bilateral 2006   UPPER GASTROINTESTINAL ENDOSCOPY  2006   Dr. Theodis in [place]     Allergies  Allergen Reactions   Alpha-Gal Anaphylaxis, Nausea And Vomiting and Shortness Of Breath   Gluten Meal     Constipation/fatigue.      Family History  Problem Relation Age of Onset   Cancer Mother        head and neck   Hypertension Father    Hyperlipidemia Father    Pancreatic cancer Maternal Grandfather    Colon cancer Neg Hx      Social History Theresa [name] reports that she  has never smoked. She has never used smokeless tobacco. Theresa [name] reports current alcohol use.   Physical Examination Today's Vitals   03/26/24 1033  BP: (!) 120/90  Pulse: 80  SpO2: 98%  Weight: 161 lb (73 kg)  Height: 5' 6 (1.676 m)   Body mass index is 25.99 kg/m.  Gen: resting comfortably, no acute distress HEENT: no scleral icterus, pupils equal round and reactive, no palptable cervical adenopathy,  CV: RRR, no mrg, no jvd Resp: Clear to auscultation bilaterally GI: abdomen is soft, non-tender, non-distended, normal bowel sounds, no hepatosplenomegaly MSK: extremities are warm, no edema.  Skin: warm, no rash Neuro:  no focal deficits Psych: appropriate affect   Diagnostic Studies  14 day event recorder 03/15/2017: Representative strips from 14 day cardiac monitor reviewed. Baseline rhythm is sinus. Sinus tachycardia noted on July 31 and  August 3, heart rate range 105 bpm to 126 bpm.   Echocardiogram 07/22/2014: Study Conclusions  - Left ventricle: The cavity size was normal. Wall thickness was   increased in a pattern of mild LVH. Systolic function was normal.   The estimated ejection fraction was in the range of 60% to 65%.   Wall motion was normal; there were no regional wall motion   abnormalities. Left ventricular diastolic function parameters   were normal.   Assessment and Plan  1.Palpitations - intermittent symptoms over the years - recent monitor with PACs, PVCs, rare short runs of NSVT and SVT.  - significant episode of palpitations few months ago, none since. We will monitor at this time, if progressing could consider beta blocker of CCB - family history of afib but has not been indentified for her. Even if transient afib her CHADS2Vasc score is 1 for gender and would not warrant anticoag, at most would require av nodal agent if symptoms were to progress - EKG today shows NSR  -f/u 1 year      Dorn PHEBE Ross, M.D.

## 2024-03-26 NOTE — Patient Instructions (Signed)
 Medication Instructions:  Your physician recommends that you continue on your current medications as directed. Please refer to the Current Medication list given to you today.   Labwork: None today  Testing/Procedures: None today  Follow-Up: 1 year with Dr.Branch  Any Other Special Instructions Will Be Listed Below (If Applicable).  If you need a refill on your cardiac medications before your next appointment, please call your pharmacy.    Thank you for choosing [facility] !

## 2024-04-17 ENCOUNTER — Other Ambulatory Visit (HOSPITAL_COMMUNITY): Payer: Self-pay

## 2024-04-18 ENCOUNTER — Telehealth

## 2024-04-18 DIAGNOSIS — B9689 Other specified bacterial agents as the cause of diseases classified elsewhere: Secondary | ICD-10-CM

## 2024-04-18 DIAGNOSIS — J019 Acute sinusitis, unspecified: Secondary | ICD-10-CM

## 2024-04-18 MED ORDER — AMOXICILLIN-POT CLAVULANATE 875-125 MG PO TABS: 1.0000 | ORAL_TABLET | Freq: Two times a day (BID) | ORAL | 0 refills | Status: AC

## 2024-04-18 MED ORDER — PREDNISONE 20 MG PO TABS
40.0000 mg | ORAL_TABLET | Freq: Every day | ORAL | 0 refills | Status: DC
Start: 2024-04-18 — End: 2024-07-04

## 2024-04-18 NOTE — Patient Instructions (Signed)
 Wheeler Senters, thank you for joining Delon CHRISTELLA Dickinson, PA-C for today's virtual visit.  While this provider is not your primary care provider (PCP), if your PCP is located in our provider database this encounter information will be shared with them immediately following your visit.   A [place] MyChart account gives you access to today's visit and all your visits, tests, and labs performed at [facility]  click here if you don't have a [place] MyChart account or go to [email or web address]  Consent: (Patient) Sharlyne Koeneman provided verbal consent for this virtual visit at the beginning of the encounter.  Current Medications:  Current Outpatient Medications:    amoxicillin -clavulanate (AUGMENTIN ) 875-125 MG tablet, Take 1 tablet by mouth 2 (two) times daily for 10 days., Disp: 20 tablet, Rfl: 0   predniSONE  (DELTASONE ) 20 MG tablet, Take 2 tablets (40 mg total) by mouth daily with breakfast., Disp: 10 tablet, Rfl: 0   ALPRAZolam  (XANAX ) 1 MG tablet, Take 1 tablet by mouth twice daily as needed for anxiety, Disp: 60 tablet, Rfl: 4   citalopram  (CELEXA ) 40 MG tablet, Take 1 tablet (40 mg total) by mouth daily., Disp: 90 tablet, Rfl: 3   EPINEPHrine  (EPIPEN  2-PAK) 0.3 mg/0.3 mL IJ SOAJ injection, Inject 0.3 mg into the muscle as needed for anaphylaxis., Disp: 1 each, Rfl: 1   fluticasone  (FLONASE ) 50 MCG/ACT nasal spray, Place 2 sprays into both nostrils daily., Disp: 16 g, Rfl: 3   ipratropium (ATROVENT ) 0.06 % nasal spray, Place 2 sprays into both nostrils 3 (three) times daily., Disp: 15 mL, Rfl: 0   levonorgestrel  (MIRENA ) 20 MCG/24HR IUD, 1 each by Intrauterine route once., Disp: , Rfl:    pantoprazole  (PROTONIX ) 40 MG tablet, Take 1 tablet (40 mg total) by mouth daily before breakfast., Disp: 90 tablet, Rfl: 3   podofilox  (CONDYLOX ) 0.5 % external solution, APPLY BY TOPICAL ROUTE 2 TIMES PER DAY FOR 3 DAYS THEN STOP FOR 4 DAYS. (REPEAT 7DAY CYCLE UNTIL NO VISIBLE WART  TISSUE/MAX OF FOUR CYCLES), Disp: 3.5 mL, Rfl: 2   polyethylene glycol (MIRALAX  / GLYCOLAX ) 17 g packet, Take 17 g by mouth daily., Disp: , Rfl:    Medications ordered in this encounter:  Meds ordered this encounter  Medications   amoxicillin -clavulanate (AUGMENTIN ) 875-125 MG tablet    Sig: Take 1 tablet by mouth 2 (two) times daily for 10 days.    Dispense:  20 tablet    Refill:  0    Supervising Provider:   LAMPTEY, PHILIP O [[id number]]   predniSONE  (DELTASONE ) 20 MG tablet    Sig: Take 2 tablets (40 mg total) by mouth daily with breakfast.    Dispense:  10 tablet    Refill:  0    Supervising Provider:   BLAISE ALEENE KIDD [[id number]]     *If you need refills on other medications prior to your next appointment, please contact your pharmacy*  Follow-Up: Call back or seek an in-person evaluation if the symptoms worsen or if the condition fails to improve as anticipated.  [facility] [place] [facility] [phone]  Other Instructions Sinus Infection, Adult A sinus infection, also called sinusitis, is inflammation of your sinuses. Sinuses are hollow spaces in the bones around your face. Your sinuses are located: Around your eyes. In the middle of your forehead. Behind your nose. In your cheekbones. Mucus normally drains out of your sinuses. When your nasal tissues become inflamed or swollen, mucus can become trapped or blocked. This allows bacteria, viruses,  and fungi to grow, which leads to infection. Most infections of the sinuses are caused by a virus. A sinus infection can develop quickly. It can last for up to 4 weeks (acute) or for more than 12 weeks (chronic). A sinus infection often develops after a cold. What are the causes? This condition is caused by anything that creates swelling in the sinuses or stops mucus from draining. This includes: Allergies. Asthma. Infection from bacteria or viruses. Deformities or blockages in your nose or sinuses. Abnormal growths in the nose  (nasal polyps). Pollutants, such as chemicals or irritants in the air. Infection from fungi. This is rare. What increases the risk? You are more likely to develop this condition if you: Have a weak body defense system (immune system). Do a lot of swimming or diving. Overuse nasal sprays. Smoke. What are the signs or symptoms? The main symptoms of this condition are pain and a feeling of pressure around the affected sinuses. Other symptoms include: Stuffy nose or congestion that makes it difficult to breathe through your nose. Thick yellow or greenish drainage from your nose. Tenderness, swelling, and warmth over the affected sinuses. A cough that may get worse at night. Decreased sense of smell and taste. Extra mucus that collects in the throat or the back of the nose (postnasal drip) causing a sore throat or bad breath. Tiredness (fatigue). Fever. How is this diagnosed? This condition is diagnosed based on: Your symptoms. Your medical history. A physical exam. Tests to find out if your condition is acute or chronic. This may include: Checking your nose for nasal polyps. Viewing your sinuses using a device that has a light (endoscope). Testing for allergies or bacteria. Imaging tests, such as an MRI or CT scan. In rare cases, a bone biopsy may be done to rule out more serious types of fungal sinus disease. How is this treated? Treatment for a sinus infection depends on the cause and whether your condition is chronic or acute. If caused by a virus, your symptoms should go away on their own within 10 days. You may be given medicines to relieve symptoms. They include: Medicines that shrink swollen nasal passages (decongestants). A spray that eases inflammation of the nostrils (topical intranasal corticosteroids). Rinses that help get rid of thick mucus in your nose (nasal saline washes). Medicines that treat allergies (antihistamines). Over-the-counter pain relievers. If caused by  bacteria, your health care provider may recommend waiting to see if your symptoms improve. Most bacterial infections will get better without antibiotic medicine. You may be given antibiotics if you have: A severe infection. A weak immune system. If caused by narrow nasal passages or nasal polyps, surgery may be needed. Follow these instructions at home: Medicines Take, use, or apply over-the-counter and prescription medicines only as told by your health care provider. These may include nasal sprays. If you were prescribed an antibiotic medicine, take it as told by your health care provider. Do not stop taking the antibiotic even if you start to feel better. Hydrate and humidify  Drink enough fluid to keep your urine pale yellow. Staying hydrated will help to thin your mucus. Use a cool mist humidifier to keep the humidity level in your home above 50%. Inhale steam for 10-15 minutes, 3-4 times a day, or as told by your health care provider. You can do this in the bathroom while a hot shower is running. Limit your exposure to cool or dry air. Rest Rest as much as possible. Sleep with your head  raised (elevated). Make sure you get enough sleep each night. General instructions  Apply a warm, moist washcloth to your face 3-4 times a day or as told by your health care provider. This will help with discomfort. Use nasal saline washes as often as told by your health care provider. Wash your hands often with soap and water  to reduce your exposure to germs. If soap and water  are not available, use hand sanitizer. Do not smoke. Avoid being around people who are smoking (secondhand smoke). Keep all follow-up visits. This is important. Contact a health care provider if: You have a fever. Your symptoms get worse. Your symptoms do not improve within 10 days. Get help right away if: You have a severe headache. You have persistent vomiting. You have severe pain or swelling around your face or  eyes. You have vision problems. You develop confusion. Your neck is stiff. You have trouble breathing. These symptoms may be an emergency. Get help right away. Call 911. Do not wait to see if the symptoms will go away. Do not drive yourself to the hospital. Summary A sinus infection is soreness and inflammation of your sinuses. Sinuses are hollow spaces in the bones around your face. This condition is caused by nasal tissues that become inflamed or swollen. The swelling traps or blocks the flow of mucus. This allows bacteria, viruses, and fungi to grow, which leads to infection. If you were prescribed an antibiotic medicine, take it as told by your health care provider. Do not stop taking the antibiotic even if you start to feel better. Keep all follow-up visits. This is important. This information is not intended to replace advice given to you by your health care provider. Make sure you discuss any questions you have with your health care provider. Document Revised: 07/05/2021 Document Reviewed: 07/05/2021 Elsevier Patient Education  2024 Elsevier Inc.   If you have been instructed to have an in-person evaluation today at a local Urgent Care facility, please use the link below. It will take you to a list of all of our available [facility], including address, phone number and hours of operation. Please do not delay care.  [facility]  If you or a family member do not have a primary care provider, use the link below to schedule a visit and establish care. When you choose a [facility] primary care physician or advanced practice provider, you gain a long-term partner in health. Find a Primary Care Provider  Learn more about [facility]'s in-office and virtual care options: [place] - Get Care Now

## 2024-04-18 NOTE — Progress Notes (Signed)
 Virtual Visit Consent   Theresa [name], you are scheduled for a virtual visit with a [facility] provider today. Just as with appointments in the office, your consent must be obtained to participate. Your consent will be active for this visit and any virtual visit you may have with one of our providers in the next 365 days. If you have a MyChart account, a copy of this consent can be sent to you electronically.  As this is a virtual visit, video technology does not allow for your provider to perform a traditional examination. This may limit your provider's ability to fully assess your condition. If your provider identifies any concerns that need to be evaluated in person or the need to arrange testing (such as labs, EKG, etc.), we will make arrangements to do so. Although advances in technology are sophisticated, we cannot ensure that it will always work on either your end or our end. If the connection with a video visit is poor, the visit may have to be switched to a telephone visit. With either a video or telephone visit, we are not always able to ensure that we have a secure connection.  By engaging in this virtual visit, you consent to the provision of healthcare and authorize for your insurance to be billed (if applicable) for the services provided during this visit. Depending on your insurance coverage, you may receive a charge related to this service.  I need to obtain your verbal consent now. Are you willing to proceed with your visit today? Theresa [name] has provided verbal consent on 04/18/2024 for a virtual visit (video or telephone). Delon CHRISTELLA Dickinson, PA-C  Date: 04/18/2024 8:40 AM   Virtual Visit via Video Note   I, Delon CHRISTELLA Dickinson, connected with  Theresa [name]  ([id number], Jun 29, 1971) on 04/18/24 at  8:30 AM EDT by a video-enabled telemedicine application and verified that I am speaking with the correct person using two identifiers.  Location: Patient: Virtual Visit Location Patient:  Home Provider: Virtual Visit Location Provider: Home Office   I discussed the limitations of evaluation and management by telemedicine and the availability of in person appointments. The patient expressed understanding and agreed to proceed.    History of Present Illness: Theresa [name] is a 52 y.o. who identifies as a female who was assigned female at birth, and is being seen today for sinus congestion.  HPI: Sinusitis This is a new problem. Episode onset: [place] over [place] and had a lot of water  get pushed in the nose; then started having sinus congestion and pain and overall feeling unwell by Monday, 04/14/24. The problem has been gradually worsening since onset. Maximum temperature: low grade fever. The pain is moderate. Associated symptoms include congestion, ear pain, headaches and sinus pressure. Pertinent negatives include no chills, coughing, hoarse voice or sore throat. Treatments tried: ibuprofen . The treatment provided no relief.     Problems:  Patient Active Problem List   Diagnosis Date Noted   Heart palpitations 12/28/2023   Encounter for general adult medical examination with abnormal findings 08/14/2023   Skin lesion of chest wall 08/14/2023   Hepatic steatosis determined by biopsy of liver 06/22/2023   Onychomycosis 04/26/2023   Grade II hemorrhoids 04/19/2023   Dysphagia 03/02/2023   Hemorrhoidal skin tags 03/02/2023   DDD (degenerative disc disease), thoracolumbar 12/08/2022   Acute recurrent frontal sinusitis 07/27/2022   Mixed hyperlipidemia 11/24/2021   Allergy to alpha-gal 08/01/2021   Seasonal allergic conjunctivitis 08/01/2021   Seasonal  and perennial allergic rhinitis 08/01/2021   Fibromyalgia 05/20/2021   Prediabetes 08/25/2019   Separation of right acromioclavicular joint 02/17/2019   Idiopathic anaphylaxis 02/12/2019   Chronic rhinitis 02/12/2019   Chronic idiopathic urticaria 02/12/2019   Hx of colonic polyps 07/17/2018   Fatigue  07/12/2015   Arthralgia 07/12/2015   Obesity 01/14/2014   History of abnormal cervical Pap smear 12/02/2013   Internal hemorrhoids with other complication 08/21/2013   Generalized anxiety disorder 12/25/2012   Gastroesophageal reflux disease 10/31/2011   Irritable bowel syndrome with constipation 05/13/2008    Allergies:  Allergies  Allergen Reactions   Alpha-Gal Anaphylaxis, Nausea And Vomiting and Shortness Of Breath   Gluten Meal     Constipation/fatigue.   Medications:  Current Outpatient Medications:    amoxicillin -clavulanate (AUGMENTIN ) 875-125 MG tablet, Take 1 tablet by mouth 2 (two) times daily for 10 days., Disp: 20 tablet, Rfl: 0   predniSONE  (DELTASONE ) 20 MG tablet, Take 2 tablets (40 mg total) by mouth daily with breakfast., Disp: 10 tablet, Rfl: 0   ALPRAZolam  (XANAX ) 1 MG tablet, Take 1 tablet by mouth twice daily as needed for anxiety, Disp: 60 tablet, Rfl: 4   citalopram  (CELEXA ) 40 MG tablet, Take 1 tablet (40 mg total) by mouth daily., Disp: 90 tablet, Rfl: 3   EPINEPHrine  (EPIPEN  2-PAK) 0.3 mg/0.3 mL IJ SOAJ injection, Inject 0.3 mg into the muscle as needed for anaphylaxis., Disp: 1 each, Rfl: 1   fluticasone  (FLONASE ) 50 MCG/ACT nasal spray, Place 2 sprays into both nostrils daily., Disp: 16 g, Rfl: 3   ipratropium (ATROVENT ) 0.06 % nasal spray, Place 2 sprays into both nostrils 3 (three) times daily., Disp: 15 mL, Rfl: 0   levonorgestrel  (MIRENA ) 20 MCG/24HR IUD, 1 each by Intrauterine route once., Disp: , Rfl:    pantoprazole  (PROTONIX ) 40 MG tablet, Take 1 tablet (40 mg total) by mouth daily before breakfast., Disp: 90 tablet, Rfl: 3   podofilox  (CONDYLOX ) 0.5 % external solution, APPLY BY TOPICAL ROUTE 2 TIMES PER DAY FOR 3 DAYS THEN STOP FOR 4 DAYS. (REPEAT 7DAY CYCLE UNTIL NO VISIBLE WART TISSUE/MAX OF FOUR CYCLES), Disp: 3.5 mL, Rfl: 2   polyethylene glycol (MIRALAX  / GLYCOLAX ) 17 g packet, Take 17 g by mouth daily., Disp: , Rfl:    Observations/Objective: Patient is well-developed, well-nourished in no acute distress.  Resting comfortably at home.  Head is normocephalic, atraumatic.  No labored breathing.  Speech is clear and coherent with logical content.  Patient is alert and oriented at baseline.    Assessment and Plan: 1. Acute bacterial sinusitis (Primary) - amoxicillin -clavulanate (AUGMENTIN ) 875-125 MG tablet; Take 1 tablet by mouth 2 (two) times daily for 10 days.  Dispense: 20 tablet; Refill: 0 - predniSONE  (DELTASONE ) 20 MG tablet; Take 2 tablets (40 mg total) by mouth daily with breakfast.  Dispense: 10 tablet; Refill: 0  - Worsening symptoms that have not responded to OTC medications.  - Will give Augmentin  and Prednisone  - Continue allergy medications.  - Steam and humidifier can help - Stay well hydrated and get plenty of rest.  - Seek in person evaluation if no symptom improvement or if symptoms worsen   Follow Up Instructions: I discussed the assessment and treatment plan with the patient. The patient was provided an opportunity to ask questions and all were answered. The patient agreed with the plan and demonstrated an understanding of the instructions.  A copy of instructions were sent to the patient via MyChart unless otherwise noted below.  The patient was advised to call back or seek an in-person evaluation if the symptoms worsen or if the condition fails to improve as anticipated.    Delon CHRISTELLA Dickinson, PA-C

## 2024-05-09 ENCOUNTER — Other Ambulatory Visit (HOSPITAL_COMMUNITY): Payer: Self-pay

## 2024-05-28 ENCOUNTER — Encounter (INDEPENDENT_AMBULATORY_CARE_PROVIDER_SITE_OTHER): Payer: Self-pay | Admitting: Gastroenterology

## 2024-06-02 ENCOUNTER — Encounter: Payer: Self-pay | Admitting: Internal Medicine

## 2024-06-04 ENCOUNTER — Telehealth: Admitting: Physician Assistant

## 2024-06-04 DIAGNOSIS — J019 Acute sinusitis, unspecified: Secondary | ICD-10-CM | POA: Diagnosis not present

## 2024-06-04 DIAGNOSIS — B9689 Other specified bacterial agents as the cause of diseases classified elsewhere: Secondary | ICD-10-CM | POA: Diagnosis not present

## 2024-06-04 MED ORDER — METHYLPREDNISOLONE 4 MG PO TBPK: ORAL_TABLET | ORAL | 0 refills | Status: DC

## 2024-06-04 MED ORDER — AMOXICILLIN-POT CLAVULANATE 875-125 MG PO TABS: 1.0000 | ORAL_TABLET | Freq: Two times a day (BID) | ORAL | 0 refills | Status: AC

## 2024-06-04 NOTE — Patient Instructions (Signed)
  Wheeler Senters, thank you for joining Elsie Velma Lunger, PA-C for today's virtual visit.  While this provider is not your primary care provider (PCP), if your PCP is located in our provider database this encounter information will be shared with them immediately following your visit.   A [facility] MyChart account gives you access to today's visit and all your visits, tests, and labs performed at [facility]  click here if you don't have a [facility] MyChart account or go to [email or web address]  Consent: (Patient) Theresa [name] provided verbal consent for this virtual visit at the beginning of the encounter.  Current Medications:  Current Outpatient Medications:    ALPRAZolam  (XANAX ) 1 MG tablet, Take 1 tablet by mouth twice daily as needed for anxiety, Disp: 60 tablet, Rfl: 4   citalopram  (CELEXA ) 40 MG tablet, Take 1 tablet (40 mg total) by mouth daily., Disp: 90 tablet, Rfl: 3   EPINEPHrine  (EPIPEN  2-PAK) 0.3 mg/0.3 mL IJ SOAJ injection, Inject 0.3 mg into the muscle as needed for anaphylaxis., Disp: 1 each, Rfl: 1   fluticasone  (FLONASE ) 50 MCG/ACT nasal spray, Place 2 sprays into both nostrils daily., Disp: 16 g, Rfl: 3   ipratropium (ATROVENT ) 0.06 % nasal spray, Place 2 sprays into both nostrils 3 (three) times daily., Disp: 15 mL, Rfl: 0   levonorgestrel  (MIRENA ) 20 MCG/24HR IUD, 1 each by Intrauterine route once., Disp: , Rfl:    pantoprazole  (PROTONIX ) 40 MG tablet, Take 1 tablet (40 mg total) by mouth daily before breakfast., Disp: 90 tablet, Rfl: 3   podofilox  (CONDYLOX ) 0.5 % external solution, APPLY BY TOPICAL ROUTE 2 TIMES PER DAY FOR 3 DAYS THEN STOP FOR 4 DAYS. (REPEAT 7DAY CYCLE UNTIL NO VISIBLE WART TISSUE/MAX OF FOUR CYCLES), Disp: 3.5 mL, Rfl: 2   polyethylene glycol (MIRALAX  / GLYCOLAX ) 17 g packet, Take 17 g by mouth daily., Disp: , Rfl:    predniSONE  (DELTASONE ) 20 MG tablet, Take 2 tablets (40 mg total) by mouth daily with breakfast., Disp: 10 tablet,  Rfl: 0   Medications ordered in this encounter:  No orders of the defined types were placed in this encounter.    *If you need refills on other medications prior to your next appointment, please contact your pharmacy*  Follow-Up: Call back or seek an in-person evaluation if the symptoms worsen or if the condition fails to improve as anticipated.  [facility] [phone]  Other Instructions Stay well hydrated. Start oral antibiotic as prescribed. If no improvement in 3-4 days then start oral steroids along with it. Continue to watch for worsening symptoms. Schedule a virtual appointment or follow up at an urgent care clinic if symptoms don't improve.    If you have been instructed to have an in-person evaluation today at a local Urgent Care facility, please use the link below. It will take you to a list of all of our available [facility], including address, phone number and hours of operation. Please do not delay care.  [facility]  If you or a family member do not have a primary care provider, use the link below to schedule a visit and establish care. When you choose a [facility] primary care physician or advanced practice provider, you gain a long-term partner in health. Find a Primary Care Provider  Learn more about [facility]'s in-office and virtual care options: [facility] - Get Care Now

## 2024-06-04 NOTE — Progress Notes (Signed)
 Virtual Visit Consent   Theresa [name], you are scheduled for a virtual visit with a [facility] provider today. Just as with appointments in the office, your consent must be obtained to participate. Your consent will be active for this visit and any virtual visit you may have with one of our providers in the next 365 days. If you have a MyChart account, a copy of this consent can be sent to you electronically.  As this is a virtual visit, video technology does not allow for your provider to perform a traditional examination. This may limit your provider's ability to fully assess your condition. If your provider identifies any concerns that need to be evaluated in person or the need to arrange testing (such as labs, EKG, etc.), we will make arrangements to do so. Although advances in technology are sophisticated, we cannot ensure that it will always work on either your end or our end. If the connection with a video visit is poor, the visit may have to be switched to a telephone visit. With either a video or telephone visit, we are not always able to ensure that we have a secure connection.  By engaging in this virtual visit, you consent to the provision of healthcare and authorize for your insurance to be billed (if applicable) for the services provided during this visit. Depending on your insurance coverage, you may receive a charge related to this service.  I need to obtain your verbal consent now. Are you willing to proceed with your visit today? Theresa [name] has provided verbal consent on 06/04/2024 for a virtual visit (video or telephone). Theresa Borg, PA-C  Date: 06/04/2024 7:07 PM   Virtual Visit via Video Note   I, Theresa [name], connected with  Theresa [name]  ([id number], 08-May-1971) on 06/04/24 at  7:00 PM EDT by a video-enabled telemedicine application and verified that I am speaking with the correct person using two identifiers.  Location: Patient: Virtual Visit Location Patient: Home Provider:  Virtual Visit Location Provider: Home Office   I discussed the limitations of evaluation and management by telemedicine and the availability of in person appointments. The patient expressed understanding and agreed to proceed.    History of Present Illness: Theresa [name] is a 53 y.o. who identifies as a female who was assigned female at birth, and is being seen today for URI symptoms.  HPI: 53 y/o F presents via telehealth video visit for c/o low grade fever, sinus pressure, pain, sore throat, slight ear pain and feeling fatigued x 8 days. +covid infection. She's not sure if she has a sinus infection along with covid.     Problems:  Patient Active Problem List   Diagnosis Date Noted   Heart palpitations 12/28/2023   Encounter for general adult medical examination with abnormal findings 08/14/2023   Skin lesion of chest wall 08/14/2023   Hepatic steatosis determined by biopsy of liver 06/22/2023   Onychomycosis 04/26/2023   Grade II hemorrhoids 04/19/2023   Dysphagia 03/02/2023   Hemorrhoidal skin tags 03/02/2023   DDD (degenerative disc disease), thoracolumbar 12/08/2022   Acute recurrent frontal sinusitis 07/27/2022   Mixed hyperlipidemia 11/24/2021   Allergy to alpha-gal 08/01/2021   Seasonal allergic conjunctivitis 08/01/2021   Seasonal and perennial allergic rhinitis 08/01/2021   Fibromyalgia 05/20/2021   Prediabetes 08/25/2019   Separation of right acromioclavicular joint 02/17/2019   Idiopathic anaphylaxis 02/12/2019   Chronic rhinitis 02/12/2019   Chronic idiopathic urticaria 02/12/2019   Hx of colonic polyps 07/17/2018   Fatigue  07/12/2015   Arthralgia 07/12/2015   Obesity 01/14/2014   History of abnormal cervical Pap smear 12/02/2013   Internal hemorrhoids with other complication 08/21/2013   Generalized anxiety disorder 12/25/2012   Gastroesophageal reflux disease 10/31/2011   Irritable bowel syndrome with constipation 05/13/2008    Allergies:  Allergies  Allergen  Reactions   Alpha-Gal Anaphylaxis, Nausea And Vomiting and Shortness Of Breath   Gluten Meal     Constipation/fatigue.   Medications:  Current Outpatient Medications:    amoxicillin -clavulanate (AUGMENTIN ) 875-125 MG tablet, Take 1 tablet by mouth 2 (two) times daily for 7 days., Disp: 14 tablet, Rfl: 0   methylPREDNISolone  (MEDROL  DOSEPAK) 4 MG TBPK tablet, Take as directed, Disp: 1 each, Rfl: 0   ALPRAZolam  (XANAX ) 1 MG tablet, Take 1 tablet by mouth twice daily as needed for anxiety, Disp: 60 tablet, Rfl: 4   citalopram  (CELEXA ) 40 MG tablet, Take 1 tablet (40 mg total) by mouth daily., Disp: 90 tablet, Rfl: 3   EPINEPHrine  (EPIPEN  2-PAK) 0.3 mg/0.3 mL IJ SOAJ injection, Inject 0.3 mg into the muscle as needed for anaphylaxis., Disp: 1 each, Rfl: 1   fluticasone  (FLONASE ) 50 MCG/ACT nasal spray, Place 2 sprays into both nostrils daily., Disp: 16 g, Rfl: 3   ipratropium (ATROVENT ) 0.06 % nasal spray, Place 2 sprays into both nostrils 3 (three) times daily., Disp: 15 mL, Rfl: 0   levonorgestrel  (MIRENA ) 20 MCG/24HR IUD, 1 each by Intrauterine route once., Disp: , Rfl:    pantoprazole  (PROTONIX ) 40 MG tablet, Take 1 tablet (40 mg total) by mouth daily before breakfast., Disp: 90 tablet, Rfl: 3   podofilox  (CONDYLOX ) 0.5 % external solution, APPLY BY TOPICAL ROUTE 2 TIMES PER DAY FOR 3 DAYS THEN STOP FOR 4 DAYS. (REPEAT 7DAY CYCLE UNTIL NO VISIBLE WART TISSUE/MAX OF FOUR CYCLES), Disp: 3.5 mL, Rfl: 2   polyethylene glycol (MIRALAX  / GLYCOLAX ) 17 g packet, Take 17 g by mouth daily., Disp: , Rfl:    predniSONE  (DELTASONE ) 20 MG tablet, Take 2 tablets (40 mg total) by mouth daily with breakfast., Disp: 10 tablet, Rfl: 0  Observations/Objective: Patient is well-developed, well-nourished in no acute distress.  Resting comfortably  at home.  Head is normocephalic, atraumatic.  No labored breathing.  Speech is clear and coherent with logical content.  Patient is alert and oriented at baseline.     Assessment and Plan: 1. Acute bacterial sinusitis (Primary) - amoxicillin -clavulanate (AUGMENTIN ) 875-125 MG tablet; Take 1 tablet by mouth 2 (two) times daily for 7 days.  Dispense: 14 tablet; Refill: 0 - methylPREDNISolone  (MEDROL  DOSEPAK) 4 MG TBPK tablet; Take as directed  Dispense: 1 each; Refill: 0  Stay well hydrated. Start oral antibiotic as prescribed. If no improvement in 3-4 days then start oral steroids along with it. Continue to watch for worsening symptoms. Schedule a virtual appointment or follow up at an urgent care clinic if symptoms don't improve.  Pt verbalized understanding and in agreement.    Follow Up Instructions: I discussed the assessment and treatment plan with the patient. The patient was provided an opportunity to ask questions and all were answered. The patient agreed with the plan and demonstrated an understanding of the instructions.  A copy of instructions were sent to the patient via MyChart unless otherwise noted below.   Patient has requested to receive PHI (AVS, Work Notes, etc) pertaining to this video visit through e-mail as they are currently without active MyChart. They have voiced understand that email is not considered  secure and their health information could be viewed by someone other than the patient.   The patient was advised to call back or seek an in-person evaluation if the symptoms worsen or if the condition fails to improve as anticipated.    Waddell Iten, PA-C

## 2024-06-10 ENCOUNTER — Other Ambulatory Visit: Payer: Self-pay | Admitting: Internal Medicine

## 2024-06-10 DIAGNOSIS — J069 Acute upper respiratory infection, unspecified: Secondary | ICD-10-CM

## 2024-06-10 MED ORDER — AZITHROMYCIN 250 MG PO TABS: ORAL_TABLET | ORAL | 0 refills | Status: AC

## 2024-07-04 ENCOUNTER — Ambulatory Visit (INDEPENDENT_AMBULATORY_CARE_PROVIDER_SITE_OTHER): Admitting: Internal Medicine

## 2024-07-04 VITALS — BP 125/80 | HR 94 | Ht 66.0 in | Wt 170.8 lb

## 2024-07-04 DIAGNOSIS — R002 Palpitations: Secondary | ICD-10-CM

## 2024-07-04 DIAGNOSIS — M797 Fibromyalgia: Secondary | ICD-10-CM | POA: Diagnosis not present

## 2024-07-04 DIAGNOSIS — R739 Hyperglycemia, unspecified: Secondary | ICD-10-CM | POA: Diagnosis not present

## 2024-07-04 DIAGNOSIS — F411 Generalized anxiety disorder: Secondary | ICD-10-CM

## 2024-07-04 DIAGNOSIS — E782 Mixed hyperlipidemia: Secondary | ICD-10-CM | POA: Diagnosis not present

## 2024-07-04 DIAGNOSIS — E559 Vitamin D deficiency, unspecified: Secondary | ICD-10-CM

## 2024-07-04 MED ORDER — AMITRIPTYLINE HCL 10 MG PO TABS: 10.0000 mg | ORAL_TABLET | Freq: Every day | ORAL | 3 refills | Status: DC

## 2024-07-04 NOTE — Progress Notes (Signed)
 Established Patient Office Visit  Subjective:  Patient ID: Theresa [name], female    DOB: [birth year]  Age: 53 y.o. MRN: [id number]  CC:  Chief Complaint  Patient presents with   Medical Management of Chronic Issues    6 month f/u    Menopause    Pt reports sx of menopause, night sweats periodically, insomnia, all within the last 8 months.    HPI Theresa [name] is a 53 y.o. female with past medical history of GERD, chronic rhinitis, GAD, fibromyalgia and prediabetes who presents for f/u of her chronic medical conditions.  She has history of fibromyalgia. Her myalgias have improved with weight loss. She also started using Bonsai supplement, which has improved her fatigue as well. She was given trial of Cymbalta , but she did not tolerate it.  She has tried massage therapy with mild relief. She has chronic low back pain, denies any numbness or tingling of LE.  Denies saddle anesthesia, urinary or stool incontinence.  She has started taking semaglutide  formulation again through [facility]. Denies overt nausea or vomiting currently.  She has been taking Celexa , and Xanax  as needed for anxiety. She had to take Xanax  BID on some days for insomnia and GAD as well. She reports persistent insomnia, especially difficulty maintaining sleep after 3 AM.  She reports episodes of drenching sweats at nighttime at times, but does not report other menopausal symptoms such as vaginal dryness or itching.  Palpitations: She had an episode of palpitations in 04/25, which lasted for about 4 hours midnight.  She had cardiac monitor, which showed PACs, PVCs and short runs of NSVT/SVT.  She has been evaluated by cardiology, who recommended watchful observation for now. She has had intermittent spells of palpitations since then.   Past Medical History:  Diagnosis Date   Allergy    Allergy to alpha-gal    Anxiety    Colon adenoma DEC 2014   ONE(7 MM) SIMPLE([place])   Concussion with loss of consciousness  02/17/2019   Depression    Edema    GERD (gastroesophageal reflux disease)    History of abnormal cervical Pap smear 12/02/2013   History of nephrolithiasis    HPV (human papilloma virus) infection    Hyperlipidemia    IBS (irritable bowel syndrome)    constipation   Insomnia    Lymphadenopathy 07/12/2020   Obesity 01/14/2014   Positive test for Epstein-Barr virus (EBV) 08/21/2017   Pre-diabetes 09/2014   Superficial basal cell carcinoma (BCC) 12/26/2019   Right Breast- treatment Imiquimoid   Yeast infection 09/04/2013    Past Surgical History:  Procedure Laterality Date   BIOPSY N/A 08/01/2013   Procedure: BIOPSY;  Surgeon: Margo LITTIE Haddock, MD;  [facility];  Service: Endoscopy;  Laterality: N/A;  FOR MICROSCOPIC COLITIS   BIOPSY  08/09/2018   Procedure: BIOPSY;  Surgeon: Golda Claudis PENNER, MD;  [facility]: [facility];  Service: Endoscopy;;  gastric   BREAST REDUCTION SURGERY  2009   BREAST SURGERY  2007   breast reduction   CHOLECYSTECTOMY N/A 11/04/2014   Procedure: LAPAROSCOPIC CHOLECYSTECTOMY;  Surgeon: Oneil Budge Md, MD;  Location: [facility];  Service: General;  Laterality: N/A;   COLONOSCOPY  Oct 2009   Dr. Avram: int/ext hemorrhoids, ?mild proctitis but path benign, likely r/t irritation from bowel prep   COLONOSCOPY N/A 08/01/2013   Procedure: COLONOSCOPY;  Surgeon: Margo LITTIE Haddock, MD;  [facility];  Service: Endoscopy;  Laterality: N/A;  12:00  COLONOSCOPY WITH PROPOFOL  N/A 08/09/2018   Procedure: COLONOSCOPY WITH PROPOFOL ;  Surgeon: Golda Claudis PENNER, MD;  [facility];  Service: Endoscopy;  Laterality: N/A;  730   COLONOSCOPY WITH PROPOFOL  N/A 03/15/2023   Procedure: COLONOSCOPY WITH PROPOFOL ;  Surgeon: Cindie Carlin POUR, DO;  [facility];  Service: Endoscopy;  Laterality: N/A;  10:30 am, asa 2   ESOPHAGOGASTRODUODENOSCOPY  Dec 2009   Dr. Avram: reflux esophagitis, proximal gastric polyps, benign   ESOPHAGOGASTRODUODENOSCOPY  N/A 12/18/2014   Procedure: ESOPHAGOGASTRODUODENOSCOPY (EGD);  Surgeon: Claudis PENNER Golda, MD;  [facility];  Service: Endoscopy;  Laterality: N/A;  230   ESOPHAGOGASTRODUODENOSCOPY (EGD) WITH PROPOFOL  N/A 08/09/2018   Procedure: ESOPHAGOGASTRODUODENOSCOPY (EGD) WITH PROPOFOL ;  Surgeon: Golda Claudis PENNER, MD;  [facility];  Service: Endoscopy;  Laterality: N/A;   ESOPHAGOGASTRODUODENOSCOPY (EGD) WITH PROPOFOL  N/A 03/15/2023   Procedure: ESOPHAGOGASTRODUODENOSCOPY (EGD) WITH PROPOFOL ;  Surgeon: Cindie Carlin POUR, DO;  [facility];  Service: Endoscopy;  Laterality: N/A;   HEMORRHOID BANDING N/A 08/01/2013   Procedure: HEMORRHOID BANDING;  Surgeon: Margo LITTIE Haddock, MD;  [facility];  Service: Endoscopy;  Laterality: N/A;  internal hemorrhoid banding   LIVER BIOPSY N/A 11/04/2014   Procedure: LIVER BIOPSY;  Surgeon: Oneil Budge Md, MD;  Location: [facility];  Service: General;  Laterality: N/A;   POLYPECTOMY  2015   Dr.Fields   POLYPECTOMY  08/09/2018   Procedure: POLYPECTOMY;  Surgeon: Golda Claudis PENNER, MD;  [facility];  Service: Endoscopy;;  colon   POLYPECTOMY  03/15/2023   Procedure: POLYPECTOMY;  Surgeon: Cindie Carlin POUR, DO;  [facility];  Service: Endoscopy;;  gastric/ascending colon   REDUCTION MAMMAPLASTY Bilateral 2006   UPPER GASTROINTESTINAL ENDOSCOPY  2006   Dr. Theodis in [place]    Family History  Problem Relation Age of Onset   Cancer Mother        head and neck   Hypertension Father    Hyperlipidemia Father    Pancreatic cancer Maternal Grandfather    Colon cancer Neg Hx     Social History   Socioeconomic History   Marital status: Married    Spouse name: Not on file   Number of children: 1   Years of education: Not on file   Highest education level: Associate degree: academic program  Occupational History   Occupation: Teacher, Adult Education: [employer]  Tobacco Use   Smoking status: Never   Smokeless  tobacco: Never   Tobacco comments:    Never smoker  Vaping Use   Vaping status: Never Used  Substance and Sexual Activity   Alcohol use: Yes    Alcohol/week: 0.0 standard drinks of alcohol    Comment: socially   Drug use: No   Sexual activity: Yes    Birth control/protection: I.U.D.  Other Topics Concern   Not on file  Social History Narrative   Not on file   Social Drivers of Health   Financial Resource Strain: Low Risk  (07/04/2024)   Overall Financial Resource Strain (CARDIA)    Difficulty of Paying Living Expenses: Not hard at all  Food Insecurity: No Food Insecurity (07/04/2024)   Hunger Vital Sign    Worried About Running Out of Food in the Last Year: Never true    Ran Out of Food in the Last Year: Never true  Transportation Needs: No Transportation Needs (07/04/2024)   PRAPARE - Transportation    Lack of  Transportation (Medical): No    Lack of Transportation (Non-Medical): No  Physical Activity: Insufficiently Active (07/04/2024)   Exercise Vital Sign    Days of Exercise per Week: 3 days    Minutes of Exercise per Session: 20 min  Stress: Stress Concern Present (07/04/2024)   [employer] of Occupational Health - Occupational Stress Questionnaire    Feeling of Stress: Rather much  Social Connections: Moderately Integrated (07/04/2024)   Social Connection and Isolation Panel    Frequency of Communication with Friends and Family: Three times a week    Frequency of Social Gatherings with Friends and Family: Once a week    Attends Religious Services: 1 to 4 times per year    Active Member of [employer] or Organizations: No    Attends Engineer, Structural: Not on file    Marital Status: Married  Catering Manager Violence: Not on file    Outpatient Medications Prior to Visit  Medication Sig Dispense Refill   ALPRAZolam  (XANAX ) 1 MG tablet Take 1 tablet by mouth twice daily as needed for anxiety 60 tablet 4   citalopram  (CELEXA ) 40 MG tablet Take 1 tablet  (40 mg total) by mouth daily. 90 tablet 3   EPINEPHrine  (EPIPEN  2-PAK) 0.3 mg/0.3 mL IJ SOAJ injection Inject 0.3 mg into the muscle as needed for anaphylaxis. 1 each 1   fluticasone  (FLONASE ) 50 MCG/ACT nasal spray Place 2 sprays into both nostrils daily. 16 g 3   ipratropium (ATROVENT ) 0.06 % nasal spray Place 2 sprays into both nostrils 3 (three) times daily. 15 mL 0   levonorgestrel  (MIRENA ) 20 MCG/24HR IUD 1 each by Intrauterine route once.     pantoprazole  (PROTONIX ) 40 MG tablet Take 1 tablet (40 mg total) by mouth daily before breakfast. 90 tablet 3   podofilox  (CONDYLOX ) 0.5 % external solution APPLY BY TOPICAL ROUTE 2 TIMES PER DAY FOR 3 DAYS THEN STOP FOR 4 DAYS. (REPEAT 7DAY CYCLE UNTIL NO VISIBLE WART TISSUE/MAX OF FOUR CYCLES) 3.5 mL 2   polyethylene glycol (MIRALAX  / GLYCOLAX ) 17 g packet Take 17 g by mouth daily.     methylPREDNISolone  (MEDROL  DOSEPAK) 4 MG TBPK tablet Take as directed 1 each 0   predniSONE  (DELTASONE ) 20 MG tablet Take 2 tablets (40 mg total) by mouth daily with breakfast. 10 tablet 0   No facility-administered medications prior to visit.    Allergies  Allergen Reactions   Alpha-Gal Anaphylaxis, Nausea And Vomiting and Shortness Of Breath   Gluten Meal     Constipation/fatigue.    ROS Review of Systems  Constitutional:  Positive for fatigue. Negative for chills and fever.  HENT:  Negative for congestion, sinus pressure, sinus pain and sore throat.   Eyes:  Negative for pain and discharge.  Respiratory:  Negative for cough and shortness of breath.   Cardiovascular:  Positive for palpitations. Negative for chest pain.  Gastrointestinal:  Positive for constipation. Negative for abdominal pain, nausea and vomiting.  Endocrine: Negative for polydipsia and polyuria.  Genitourinary:  Negative for dysuria and hematuria.  Musculoskeletal:  Positive for back pain, myalgias and neck pain. Negative for neck stiffness.  Skin:  Negative for rash.   Allergic/Immunologic: Positive for environmental allergies.  Neurological:  Negative for dizziness and weakness.  Psychiatric/Behavioral:  Negative for agitation and behavioral problems. The patient is nervous/anxious.       Objective:    Physical Exam Vitals reviewed.  Constitutional:      General: She is not in acute distress.  Appearance: She is not diaphoretic.  HENT:     Head: Normocephalic and atraumatic.     Nose: Nose normal.     Mouth/Throat:     Mouth: Mucous membranes are moist.  Eyes:     General: No scleral icterus.    Extraocular Movements: Extraocular movements intact.  Cardiovascular:     Rate and Rhythm: Normal rate and regular rhythm.     Heart sounds: Normal heart sounds. No murmur heard. Pulmonary:     Breath sounds: Normal breath sounds. No wheezing or rales.  Musculoskeletal:     Cervical back: Neck supple. No tenderness.     Right lower leg: No edema.     Left lower leg: No edema.  Skin:    General: Skin is warm.     Findings: No rash.  Neurological:     General: No focal deficit present.     Mental Status: She is alert and oriented to person, place, and time.     Sensory: No sensory deficit.     Motor: No weakness.  Psychiatric:        Mood and Affect: Mood normal.        Behavior: Behavior normal.     BP 125/80   Pulse 94   Ht 5' 6 (1.676 m)   Wt 170 lb 12.8 oz (77.5 kg)   SpO2 98%   BMI 27.57 kg/m  Wt Readings from Last 3 Encounters:  07/04/24 170 lb 12.8 oz (77.5 kg)  03/26/24 161 lb (73 kg)  12/28/23 162 lb (73.5 kg)    Lab Results  Component Value Date   TSH 1.610 08/14/2023   Lab Results  Component Value Date   WBC 7.5 08/14/2023   HGB 13.5 08/14/2023   HCT 41.1 08/14/2023   MCV 95 08/14/2023   PLT 287 08/14/2023   Lab Results  Component Value Date   NA 138 08/14/2023   K 4.4 08/14/2023   CO2 23 08/14/2023   GLUCOSE 79 08/14/2023   BUN 13 08/14/2023   CREATININE 0.71 08/14/2023   BILITOT 0.4 08/14/2023    ALKPHOS 65 08/14/2023   AST 15 08/14/2023   ALT 9 08/14/2023   PROT 6.9 08/14/2023   ALBUMIN 4.5 08/14/2023   CALCIUM 9.0 08/14/2023   ANIONGAP 8 06/01/2022   EGFR 103 08/14/2023   Lab Results  Component Value Date   CHOL 200 (H) 08/14/2023   Lab Results  Component Value Date   HDL 63 08/14/2023   Lab Results  Component Value Date   LDLCALC 118 (H) 08/14/2023   Lab Results  Component Value Date   TRIG 105 08/14/2023   Lab Results  Component Value Date   CHOLHDL 3.2 08/14/2023   Lab Results  Component Value Date   HGBA1C 4.9 08/14/2023      Assessment & Plan:   Problem List Items Addressed This Visit       Other   Generalized anxiety disorder (Chronic)   Well-controlled with Xanax  1 mg BID PRN and Celexa  Due to persistent insomnia, will taper and discontinue Celexa , advised to start amitriptyline  3 days after stopping Celexa  - amitriptyline  can help with fibromyalgia and insomnia  Had switched to Cymbalta  for benefit with fibromyalgia, but did not tolerate it well      Relevant Medications   amitriptyline  (ELAVIL ) 10 MG tablet   Other Relevant Orders   TSH   Fibromyalgia - Primary (Chronic)   Had Rheumatology evaluation in 2019, was told of fibromyalgia, but  did not start any treatment at that time Had started Cymbalta , but did not tolerate it Due to persistent symptoms and insomnia, switch to Elavil  - taper and discontinue Celexa       Relevant Medications   amitriptyline  (ELAVIL ) 10 MG tablet   Other Relevant Orders   TSH   CMP14+EGFR   CBC with Differential/Platelet   Mixed hyperlipidemia   Check lipid profile Advised to follow heart healthy diet for now With weight loss from GLP-1 agonist, her lipid profile had improved as well      Relevant Orders   Lipid panel   Heart palpitations   Had Zio patch monitor, showed PAC, PVC and short runs of SVT Her father has A-fib as well Had cardiology evaluation, advised for watchful observation for now       Other Visit Diagnoses       Hyperglycemia       Relevant Orders   Hemoglobin A1c   CMP14+EGFR     Vitamin D  deficiency       Relevant Orders   VITAMIN D  25 Hydroxy (Vit-D Deficiency, Fractures)          Meds ordered this encounter  Medications   amitriptyline  (ELAVIL ) 10 MG tablet    Sig: Take 1 tablet (10 mg total) by mouth at bedtime.    Dispense:  30 tablet    Refill:  3    Follow-up: Return in about 4 months (around 11/01/2024).    Suzzane MARLA Blanch, MD

## 2024-07-04 NOTE — Assessment & Plan Note (Addendum)
 Well-controlled with Xanax  1 mg BID PRN and Celexa  Due to persistent insomnia, will taper and discontinue Celexa , advised to start amitriptyline  3 days after stopping Celexa  - amitriptyline  can help with fibromyalgia and insomnia  Had switched to Cymbalta  for benefit with fibromyalgia, but did not tolerate it well

## 2024-07-04 NOTE — Patient Instructions (Addendum)
 Please start taking Celexa  1/2 tablet once daily for 1 week and then 1/2 tablet for the following week, and then stop it.  Please start taking Amitriptyline  3 days after stopping Celexa .  Please continue to take other medications as prescribed.  Please continue to follow low carb diet and perform moderate exercise/walking at least 150 mins/week.

## 2024-07-04 NOTE — Assessment & Plan Note (Signed)
 Had Zio patch monitor, showed PAC, PVC and short runs of SVT Her father has A-fib as well Had cardiology evaluation, advised for watchful observation for now

## 2024-07-04 NOTE — Assessment & Plan Note (Addendum)
 Had Rheumatology evaluation in 2019, was told of fibromyalgia, but did not start any treatment at that time Had started Cymbalta , but did not tolerate it Due to persistent symptoms and insomnia, switch to Elavil  - taper and discontinue Celexa 

## 2024-07-04 NOTE — Assessment & Plan Note (Addendum)
 Check lipid profile Advised to follow heart healthy diet for now With weight loss from GLP-1 agonist, her lipid profile had improved as well

## 2024-07-05 LAB — CBC WITH DIFFERENTIAL/PLATELET
Basophils Absolute: 0 x10E3/uL (ref 0.0–0.2)
Basos: 1 %
EOS (ABSOLUTE): 0.2 x10E3/uL (ref 0.0–0.4)
Eos: 3 %
Hematocrit: 40.6 % (ref 34.0–46.6)
Hemoglobin: 13.3 g/dL (ref 11.1–15.9)
Immature Grans (Abs): 0 x10E3/uL (ref 0.0–0.1)
Immature Granulocytes: 0 %
Lymphocytes Absolute: 1.7 x10E3/uL (ref 0.7–3.1)
Lymphs: 27 %
MCH: 31.9 pg (ref 26.6–33.0)
MCHC: 32.8 g/dL (ref 31.5–35.7)
MCV: 97 fL (ref 79–97)
Monocytes Absolute: 0.4 x10E3/uL (ref 0.1–0.9)
Monocytes: 7 %
Neutrophils Absolute: 3.9 x10E3/uL (ref 1.4–7.0)
Neutrophils: 61 %
Platelets: 243 x10E3/uL (ref 150–450)
RBC: 4.17 x10E6/uL (ref 3.77–5.28)
RDW: 12.2 % (ref 11.7–15.4)
WBC: 6.2 x10E3/uL (ref 3.4–10.8)

## 2024-07-05 LAB — CMP14+EGFR
ALT: 11 IU/L (ref 0–32)
AST: 15 IU/L (ref 0–40)
Albumin: 4.1 g/dL (ref 3.8–4.9)
Alkaline Phosphatase: 56 IU/L (ref 49–135)
BUN/Creatinine Ratio: 12 (ref 9–23)
BUN: 8 mg/dL (ref 6–24)
Bilirubin Total: 0.4 mg/dL (ref 0.0–1.2)
CO2: 23 mmol/L (ref 20–29)
Calcium: 9 mg/dL (ref 8.7–10.2)
Chloride: 101 mmol/L (ref 96–106)
Creatinine, Ser: 0.67 mg/dL (ref 0.57–1.00)
Globulin, Total: 2.5 g/dL (ref 1.5–4.5)
Glucose: 70 mg/dL (ref 70–99)
Potassium: 3.9 mmol/L (ref 3.5–5.2)
Sodium: 138 mmol/L (ref 134–144)
Total Protein: 6.6 g/dL (ref 6.0–8.5)
eGFR: 105 mL/min/1.73 (ref >59)

## 2024-07-05 LAB — LIPID PANEL
Chol/HDL Ratio: 2.9 ratio (ref 0.0–4.4)
Cholesterol, Total: 196 mg/dL (ref 100–199)
HDL: 68 mg/dL (ref >39)
LDL Chol Calc (NIH): 106 mg/dL — ABNORMAL HIGH (ref 0–99)
Triglycerides: 129 mg/dL (ref 0–149)
VLDL Cholesterol Cal: 22 mg/dL (ref 5–40)

## 2024-07-05 LAB — TSH: TSH: 1.25 u[IU]/mL (ref 0.450–4.500)

## 2024-07-05 LAB — HEMOGLOBIN A1C
Est. average glucose Bld gHb Est-mCnc: 94 mg/dL
Hgb A1c MFr Bld: 4.9 % (ref 4.8–5.6)

## 2024-07-05 LAB — VITAMIN D 25 HYDROXY (VIT D DEFICIENCY, FRACTURES): Vit D, 25-Hydroxy: 41.2 ng/mL (ref 30.0–100.0)

## 2024-07-07 ENCOUNTER — Ambulatory Visit: Payer: Self-pay | Admitting: Internal Medicine

## 2024-07-16 ENCOUNTER — Encounter: Payer: Self-pay | Admitting: Internal Medicine

## 2024-07-17 ENCOUNTER — Other Ambulatory Visit (HOSPITAL_COMMUNITY): Payer: Self-pay

## 2024-07-22 ENCOUNTER — Other Ambulatory Visit: Payer: Self-pay | Admitting: Internal Medicine

## 2024-07-22 DIAGNOSIS — F411 Generalized anxiety disorder: Secondary | ICD-10-CM

## 2024-07-23 ENCOUNTER — Other Ambulatory Visit (HOSPITAL_COMMUNITY): Payer: Self-pay

## 2024-07-23 MED ORDER — VALACYCLOVIR HCL 1 G PO TABS
1000.0000 mg | ORAL_TABLET | Freq: Every day | ORAL | 4 refills | Status: AC
  Filled 2024-07-23: qty 30, 30d supply, fill #0

## 2024-07-24 ENCOUNTER — Ambulatory Visit: Payer: Self-pay | Admitting: *Deleted

## 2024-07-24 NOTE — Telephone Encounter (Signed)
 FYI Only or Action Required?: FYI only for provider: UC appointment scheduled 12/12.  Patient was last seen in primary care on 07/04/2024 by Tobie Suzzane POUR, MD.  Called Nurse Triage reporting Groin Pain.  Symptoms began several weeks ago.  Interventions attempted: OTC medications: tylenol .  Symptoms are: gradually worsening.  Triage Disposition: See Physician Within 24 Hours  Patient/caregiver understands and will follow disposition?: Yes  Copied from CRM #[id number]. Topic: Clinical - Red Word Triage >> Jul 24, 2024  3:46 PM Amber H wrote: [facility] that prompted transfer to Nurse Triage: Patient called and stated she had a knot on her left inguinal lymph node and neuropathy going down left leg, across abdomen and into in buttocks. Complaining of pain. Reason for Disposition  [1] Single large node AND [2] size > 1 inch (2.5 cm) AND [3] no fever  Answer Assessment - Initial Assessment Questions 1. LOCATION: Where is the swollen node located? Is the matching node on the other side of the body also swollen?      L groin- not matching other side 2. SIZE: How big is the node? (e.g., inches or centimeters; or compared to common objects such as pea, bean, marble, golf ball)      1 long x 1/2  wide 3. ONSET: When did the swelling start?      1 1/2 week- sensation started- today felt- unable to work due to pain 4. NECK NODES: Is there a sore throat, runny nose or other symptoms of a cold?      no 5. GROIN OR ARMPIT NODES: Is there a sore, scratch, cut or painful red area on that arm or leg?      no 6. FEVER: Do you have a fever? If Yes, ask: What is it, how was it measured, and when did it start?      no 7. CAUSE: What do you think is causing the swollen lymph nodes?     Unsure 8. OTHER SYMPTOMS: Do you have any other symptoms? (e.g., node is tender to touch, skin redness over node, weight changes)     pain into the leg/buttock and abdomen  Protocols used: Lymph  Nodes - Swollen-A-AH

## 2024-07-25 ENCOUNTER — Ambulatory Visit
Admission: RE | Admit: 2024-07-25 | Discharge: 2024-07-25 | Disposition: A | Discharge status: Home / Self Care | Attending: Family Medicine

## 2024-07-25 VITALS — BP 126/85 | HR 77 | Temp 97.6°F | Resp 16

## 2024-07-25 DIAGNOSIS — N390 Urinary tract infection, site not specified: Secondary | ICD-10-CM | POA: Diagnosis not present

## 2024-07-25 DIAGNOSIS — R590 Localized enlarged lymph nodes: Secondary | ICD-10-CM

## 2024-07-25 LAB — POCT URINE DIPSTICK
Bilirubin, UA: NEGATIVE
Glucose, UA: NEGATIVE mg/dL
Ketones, POC UA: NEGATIVE mg/dL
Nitrite, UA: NEGATIVE
POC PROTEIN,UA: NEGATIVE
Spec Grav, UA: 1.02 (ref 1.010–1.025)
Urobilinogen, UA: 0.2 U/dL
pH, UA: 5.5 (ref 5.0–8.0)

## 2024-07-25 MED ORDER — ONDANSETRON 4 MG PO TBDP: 4.0000 mg | ORAL_TABLET | Freq: Three times a day (TID) | ORAL | 0 refills | Status: AC | PRN

## 2024-07-25 MED ORDER — SULFAMETHOXAZOLE-TRIMETHOPRIM 800-160 MG PO TABS: 1.0000 | ORAL_TABLET | Freq: Two times a day (BID) | ORAL | 0 refills | Status: DC

## 2024-07-25 NOTE — ED Triage Notes (Signed)
 Pt reports she has been having left side abd pain, low back pain, and leg pain  x 1  weeks States she also has lymph node pain in gorin area.

## 2024-07-25 NOTE — ED Provider Notes (Signed)
 [facility]    CSN: [id number] Arrival date & time: 07/25/24  0849      History   Chief Complaint Chief Complaint  Patient presents with   Groin Pain    groin pain/swelling, leg/buttock pain - Entered by patient    HPI Theresa [name] is a 53 y.o. female.   Patient presenting today with about a week of left-sided lower abdominal discomfort, low back aching, nerve pain in this area that she initially thought was a herpes outbreak but symptoms have not improved since starting back on her Valtrex .  She is now also noticing a swollen lymph node in the left groin region.  Denies fevers, chills, vomiting, diarrhea or constipation, vaginal discharge, itching or irritation, dysuria, hematuria.  So far trying the Valtrex  with minimal relief.   Past Medical History:  Diagnosis Date   Allergy    Allergy to alpha-gal    Anxiety    Colon adenoma DEC 2014   ONE(7 MM) SIMPLE([place])   Concussion with loss of consciousness 02/17/2019   Depression    Edema    GERD (gastroesophageal reflux disease)    History of abnormal cervical Pap smear 12/02/2013   History of nephrolithiasis    HPV (human papilloma virus) infection    Hyperlipidemia    IBS (irritable bowel syndrome)    constipation   Insomnia    Lymphadenopathy 07/12/2020   Obesity 01/14/2014   Positive test for Epstein-Barr virus (EBV) 08/21/2017   Pre-diabetes 09/2014   Superficial basal cell carcinoma (BCC) 12/26/2019   Right Breast- treatment Imiquimoid   Yeast infection 09/04/2013    Patient Active Problem List   Diagnosis Date Noted   Heart palpitations 12/28/2023   Encounter for general adult medical examination with abnormal findings 08/14/2023   Skin lesion of chest wall 08/14/2023   Hepatic steatosis determined by biopsy of liver 06/22/2023   Onychomycosis 04/26/2023   Grade II hemorrhoids 04/19/2023   Dysphagia 03/02/2023   Hemorrhoidal skin tags 03/02/2023   DDD (degenerative disc disease), thoracolumbar  12/08/2022   Acute recurrent frontal sinusitis 07/27/2022   Mixed hyperlipidemia 11/24/2021   Allergy to alpha-gal 08/01/2021   Seasonal allergic conjunctivitis 08/01/2021   Seasonal and perennial allergic rhinitis 08/01/2021   Fibromyalgia 05/20/2021   Prediabetes 08/25/2019   Separation of right acromioclavicular joint 02/17/2019   Idiopathic anaphylaxis 02/12/2019   Chronic rhinitis 02/12/2019   Chronic idiopathic urticaria 02/12/2019   Hx of colonic polyps 07/17/2018   Fatigue 07/12/2015   Arthralgia 07/12/2015   Obesity 01/14/2014   History of abnormal cervical Pap smear 12/02/2013   Internal hemorrhoids with other complication 08/21/2013   Generalized anxiety disorder 12/25/2012   Gastroesophageal reflux disease 10/31/2011   Irritable bowel syndrome with constipation 05/13/2008    Past Surgical History:  Procedure Laterality Date   BIOPSY N/A 08/01/2013   Procedure: BIOPSY;  Surgeon: Margo LITTIE Haddock, MD;  [facility];  Service: Endoscopy;  Laterality: N/A;  FOR MICROSCOPIC COLITIS   BIOPSY  08/09/2018   Procedure: BIOPSY;  Surgeon: Golda Claudis PENNER, MD;  [facility]: [facility];  Service: Endoscopy;;  gastric   BREAST REDUCTION SURGERY  2009   BREAST SURGERY  2007   breast reduction   CHOLECYSTECTOMY N/A 11/04/2014   Procedure: LAPAROSCOPIC CHOLECYSTECTOMY;  Surgeon: Oneil Budge Md, MD;  Location: [facility];  Service: General;  Laterality: N/A;   COLONOSCOPY  Oct 2009   Dr. Avram: int/ext hemorrhoids, ?mild proctitis but path benign, likely r/t irritation from bowel  prep   COLONOSCOPY N/A 08/01/2013   Procedure: COLONOSCOPY;  Surgeon: Margo LITTIE Haddock, MD;  [facility]: [facility];  Service: Endoscopy;  Laterality: N/A;  12:00   COLONOSCOPY WITH PROPOFOL  N/A 08/09/2018   Procedure: COLONOSCOPY WITH PROPOFOL ;  Surgeon: Golda Claudis PENNER, MD;  [facility];  Service: Endoscopy;  Laterality: N/A;  730   COLONOSCOPY WITH PROPOFOL  N/A 03/15/2023   Procedure:  COLONOSCOPY WITH PROPOFOL ;  Surgeon: Cindie Carlin POUR, DO;  [facility];  Service: Endoscopy;  Laterality: N/A;  10:30 am, asa 2   ESOPHAGOGASTRODUODENOSCOPY  Dec 2009   Dr. Avram: reflux esophagitis, proximal gastric polyps, benign   ESOPHAGOGASTRODUODENOSCOPY N/A 12/18/2014   Procedure: ESOPHAGOGASTRODUODENOSCOPY (EGD);  Surgeon: Claudis PENNER Golda, MD;  [facility];  Service: Endoscopy;  Laterality: N/A;  230   ESOPHAGOGASTRODUODENOSCOPY (EGD) WITH PROPOFOL  N/A 08/09/2018   Procedure: ESOPHAGOGASTRODUODENOSCOPY (EGD) WITH PROPOFOL ;  Surgeon: Golda Claudis PENNER, MD;  [facility];  Service: Endoscopy;  Laterality: N/A;   ESOPHAGOGASTRODUODENOSCOPY (EGD) WITH PROPOFOL  N/A 03/15/2023   Procedure: ESOPHAGOGASTRODUODENOSCOPY (EGD) WITH PROPOFOL ;  Surgeon: Cindie Carlin POUR, DO;  [facility];  Service: Endoscopy;  Laterality: N/A;   HEMORRHOID BANDING N/A 08/01/2013   Procedure: HEMORRHOID BANDING;  Surgeon: Margo LITTIE Haddock, MD;  [facility];  Service: Endoscopy;  Laterality: N/A;  internal hemorrhoid banding   LIVER BIOPSY N/A 11/04/2014   Procedure: LIVER BIOPSY;  Surgeon: Oneil Budge Md, MD;  Location: [facility];  Service: General;  Laterality: N/A;   POLYPECTOMY  2015   Dr.Fields   POLYPECTOMY  08/09/2018   Procedure: POLYPECTOMY;  Surgeon: Golda Claudis PENNER, MD;  [facility];  Service: Endoscopy;;  colon   POLYPECTOMY  03/15/2023   Procedure: POLYPECTOMY;  Surgeon: Cindie Carlin POUR, DO;  [facility];  Service: Endoscopy;;  gastric/ascending colon   REDUCTION MAMMAPLASTY Bilateral 2006   UPPER GASTROINTESTINAL ENDOSCOPY  2006   Dr. Theodis in [place]    OB History     Gravida  1   Para  1   Term      Preterm      AB      Living  1      SAB      IAB      Ectopic      Multiple      Live Births  1            Home Medications    Prior to Admission medications  Medication Sig Start Date  End Date Taking? Authorizing Provider  ondansetron  (ZOFRAN -ODT) 4 MG disintegrating tablet Take 1 tablet (4 mg total) by mouth every 8 (eight) hours as needed for nausea or vomiting. 07/25/24  Yes Stuart Vernell Norris, PA-C  sulfamethoxazole -trimethoprim  (BACTRIM  DS) 800-160 MG tablet Take 1 tablet by mouth 2 (two) times daily for 5 days. 07/25/24 07/30/24 Yes Stuart Vernell Norris, PA-C  ALPRAZolam  (XANAX ) 1 MG tablet Take 1 tablet by mouth twice daily as needed for anxiety 07/23/24   Tobie Suzzane POUR, MD  amitriptyline  (ELAVIL ) 10 MG tablet Take 1 tablet (10 mg total) by mouth at bedtime. 07/04/24   Tobie Suzzane POUR, MD  citalopram  (CELEXA ) 40 MG tablet Take 1 tablet (40 mg total) by mouth daily. 08/14/23   Tobie Suzzane POUR, MD  EPINEPHrine  (EPIPEN  2-PAK) 0.3 mg/0.3 mL IJ SOAJ injection Inject 0.3 mg into the muscle as needed for anaphylaxis. 07/19/22   Cari Arlean HERO,  FNP  fluticasone  (FLONASE ) 50 MCG/ACT nasal spray Place 2 sprays into both nostrils daily. 10/24/23   Patel, Rutwik K, MD  ipratropium (ATROVENT ) 0.06 % nasal spray Place 2 sprays into both nostrils 3 (three) times daily. 04/04/22   Iva Marty Saltness, MD  levonorgestrel  (MIRENA ) 20 MCG/24HR IUD 1 each by Intrauterine route once.    [provider]  pantoprazole  (PROTONIX ) 40 MG tablet Take 1 tablet (40 mg total) by mouth daily before breakfast. 10/12/23   Ezzard Sonny RAMAN, PA-C  podofilox  (CONDYLOX ) 0.5 % external solution APPLY BY TOPICAL ROUTE 2 TIMES PER DAY FOR 3 DAYS THEN STOP FOR 4 DAYS. (REPEAT 7DAY CYCLE UNTIL NO VISIBLE WART TISSUE/MAX OF FOUR CYCLES) 01/25/24     polyethylene glycol (MIRALAX  / GLYCOLAX ) 17 g packet Take 17 g by mouth daily.    [provider]  valACYclovir  (VALTREX ) 1000 MG tablet Take 1 tablet (1,000 mg total) by mouth daily. 07/23/24       Family History Family History  Problem Relation Age of Onset   Cancer Mother        head and neck   Hypertension Father    Hyperlipidemia Father     Pancreatic cancer Maternal Grandfather    Colon cancer Neg Hx     Social History Social History[1]   Allergies   Alpha-gal and Gluten meal   Review of Systems Review of Systems Per HPI  Physical Exam Triage Vital Signs ED Triage Vitals  Encounter Vitals Group     BP 07/25/24 0856 126/85     Girls Systolic BP Percentile --      Girls Diastolic BP Percentile --      Boys Systolic BP Percentile --      Boys Diastolic BP Percentile --      Pulse Rate 07/25/24 0856 77     Resp 07/25/24 0856 16     Temp 07/25/24 0856 97.6 F (36.4 C)     Temp Source 07/25/24 0856 Oral     SpO2 07/25/24 0856 96 %     Weight --      Height --      Head Circumference --      Peak Flow --      Pain Score 07/25/24 0854 2     Pain Loc --      Pain Education --      Exclude from Growth Chart --    No data found.  Updated Vital Signs BP 126/85 (BP Location: Right Arm)   Pulse 77   Temp 97.6 F (36.4 C) (Oral)   Resp 16   LMP  (LMP Unknown)   SpO2 96%   Visual Acuity Right Eye Distance:   Left Eye Distance:   Bilateral Distance:    Right Eye Near:   Left Eye Near:    Bilateral Near:     Physical Exam Vitals and nursing note reviewed.  Constitutional:      Appearance: Normal appearance. She is not ill-appearing.  HENT:     Head: Atraumatic.     Mouth/Throat:     Mouth: Mucous membranes are moist.  Eyes:     Extraocular Movements: Extraocular movements intact.     Conjunctiva/sclera: Conjunctivae normal.  Cardiovascular:     Rate and Rhythm: Normal rate.  Pulmonary:     Effort: Pulmonary effort is normal.  Abdominal:     General: Bowel sounds are normal. There is no distension.     Palpations: Abdomen is soft. There  is no mass.     Tenderness: There is no abdominal tenderness. There is no guarding or rebound.     Hernia: No hernia is present.  Musculoskeletal:        General: Normal range of motion.     Cervical back: Normal range of motion and neck supple.   Lymphadenopathy:     Lower Body: Left inguinal adenopathy present.  Skin:    General: Skin is warm and dry.  Neurological:     Mental Status: She is alert and oriented to person, place, and time.     Motor: No weakness.     Gait: Gait normal.  Psychiatric:        Mood and Affect: Mood normal.        Thought Content: Thought content normal.        Judgment: Judgment normal.      UC Treatments / Results  Labs (all labs ordered are listed, but only abnormal results are displayed) Labs Reviewed  POCT URINE DIPSTICK - Abnormal; Notable for the following components:      Result Value   Clarity, UA cloudy (*)    Blood, UA small (*)    Leukocytes, UA Small (1+) (*)    All other components within normal limits  URINE CULTURE  CBC WITH DIFFERENTIAL/PLATELET    EKG   Radiology No results found.  Procedures Procedures (including critical care time)  Medications Ordered in UC Medications - No data to display  Initial Impression / Assessment and Plan / UC Course  I have reviewed the triage vital signs and the nursing notes.  Pertinent labs & imaging results that were available during my care of the patient were reviewed by me and considered in my medical decision making (see chart for details).     Vital signs within normal limits, she is well-appearing and in no acute distress.  Urinalysis today with evidence of a urinary tract infection.  Will obtain urine culture and treat with Bactrim , fluids, over-the-counter pain relievers while monitoring for symptom improvement.  CBC pending for further evaluation.  Declines vaginal swab at this time and wishes to try current plan first.  Discussed return precautions for any worsening symptoms.  Final Clinical Impressions(s) / UC Diagnoses   Final diagnoses:  Acute lower UTI  Inguinal adenopathy     Discharge Instructions      I have sent in some antibiotics for a potential urinary tract infection.  I have sent out a urine  culture for more information and we have also obtained blood counts today.  We should have this information back in the next day or so and we will let you know if anything comes back abnormal on these.  Drink plenty of fluids, over-the-counter pain relievers as needed and follow-up for worsening symptoms at any time.    ED Prescriptions     Medication Sig Dispense Auth. Provider   sulfamethoxazole -trimethoprim  (BACTRIM  DS) 800-160 MG tablet Take 1 tablet by mouth 2 (two) times daily for 5 days. 10 tablet Stuart Vernell Norris, PA-C   ondansetron  (ZOFRAN -ODT) 4 MG disintegrating tablet Take 1 tablet (4 mg total) by mouth every 8 (eight) hours as needed for nausea or vomiting. 20 tablet Stuart Vernell Norris, [place]      PDMP not reviewed this encounter.    [1]  Social History Tobacco Use   Smoking status: Never   Smokeless tobacco: Never   Tobacco comments:    Never smoker  Vaping Use   Vaping  status: Never Used  Substance Use Topics   Alcohol use: Yes    Alcohol/week: 0.0 standard drinks of alcohol    Comment: socially   Drug use: No     Stuart Vernell Norris, PA-C 07/25/24 0952

## 2024-07-25 NOTE — Discharge Instructions (Signed)
 I have sent in some antibiotics for a potential urinary tract infection.  I have sent out a urine culture for more information and we have also obtained blood counts today.  We should have this information back in the next day or so and we will let you know if anything comes back abnormal on these.  Drink plenty of fluids, over-the-counter pain relievers as needed and follow-up for worsening symptoms at any time.

## 2024-07-26 LAB — URINE CULTURE: Culture: 80000 — AB

## 2024-07-26 LAB — CBC WITH DIFFERENTIAL/PLATELET
Basophils Absolute: 0 x10E3/uL (ref 0.0–0.2)
Basos: 1 %
EOS (ABSOLUTE): 0.2 x10E3/uL (ref 0.0–0.4)
Eos: 3 %
Hematocrit: 41 % (ref 34.0–46.6)
Hemoglobin: 13.9 g/dL (ref 11.1–15.9)
Immature Grans (Abs): 0 x10E3/uL (ref 0.0–0.1)
Immature Granulocytes: 0 %
Lymphocytes Absolute: 1.3 x10E3/uL (ref 0.7–3.1)
Lymphs: 23 %
MCH: 32.3 pg (ref 26.6–33.0)
MCHC: 33.9 g/dL (ref 31.5–35.7)
MCV: 95 fL (ref 79–97)
Monocytes Absolute: 0.3 x10E3/uL (ref 0.1–0.9)
Monocytes: 5 %
Neutrophils Absolute: 4 x10E3/uL (ref 1.4–7.0)
Neutrophils: 68 %
Platelets: 242 x10E3/uL (ref 150–450)
RBC: 4.3 x10E6/uL (ref 3.77–5.28)
RDW: 12.1 % (ref 11.7–15.4)
WBC: 5.8 x10E3/uL (ref 3.4–10.8)

## 2024-07-28 ENCOUNTER — Encounter: Payer: Self-pay | Admitting: Internal Medicine

## 2024-07-28 ENCOUNTER — Other Ambulatory Visit: Payer: Self-pay | Admitting: Oncology

## 2024-07-28 ENCOUNTER — Ambulatory Visit (HOSPITAL_COMMUNITY)
Admission: RE | Admit: 2024-07-28 | Discharge: 2024-07-28 | Disposition: A | Source: Ambulatory Visit | Attending: Oncology

## 2024-07-28 ENCOUNTER — Other Ambulatory Visit: Payer: Self-pay

## 2024-07-28 ENCOUNTER — Ambulatory Visit: Payer: Self-pay

## 2024-07-28 ENCOUNTER — Other Ambulatory Visit: Payer: Self-pay | Admitting: Internal Medicine

## 2024-07-28 ENCOUNTER — Ambulatory Visit (HOSPITAL_COMMUNITY): Payer: Self-pay

## 2024-07-28 DIAGNOSIS — R590 Localized enlarged lymph nodes: Secondary | ICD-10-CM | POA: Insufficient documentation

## 2024-07-28 MED ORDER — AMPICILLIN 500 MG PO CAPS: 500.0000 mg | ORAL_CAPSULE | Freq: Four times a day (QID) | ORAL | 0 refills | Status: AC

## 2024-07-28 NOTE — Telephone Encounter (Signed)
 FYI Only or Action Required?: Action required by provider: update on patient condition.  Patient was last seen in primary care on 07/04/2024 by Tobie Suzzane POUR, MD.  Called Nurse Triage reporting No chief complaint on file..  Symptoms began several weeks ago.  Interventions attempted: Other: went to UC as advised.  Symptoms are: unchanged.  Triage Disposition: Duplicate Contact Calls  Patient/caregiver understands and will follow disposition?: Rn called pt. She states that she works at the [employer] at [facility] and already had this addressed by them and Dr. Tobie. Pt denies any further needs at this time.    Copied from CRM [id number]. Topic: Clinical - Red Word Triage >> Jul 28, 2024  9:39 AM Thersia BROCKS wrote: [facility] that prompted transfer to Nurse Triage: Patient called in regarding talking to triage last week regarding symptoms knot on her left inguinal lymph node and neuropathy going down left leg, across abdomen and into in buttocks. Complaining of pain. Would like to speak with NT again >> Jul 28, 2024  9:59 AM Thersia BROCKS wrote: Patient disconnected while waiting on NT

## 2024-07-29 ENCOUNTER — Ambulatory Visit

## 2024-07-30 ENCOUNTER — Inpatient Hospital Stay: Admitting: Oncology

## 2024-07-30 ENCOUNTER — Inpatient Hospital Stay: Attending: Oncology | Admitting: Oncology

## 2024-07-30 VITALS — BP 112/79 | HR 101 | Temp 98.7°F | Resp 19 | Ht 66.0 in | Wt 165.0 lb

## 2024-07-30 DIAGNOSIS — R590 Localized enlarged lymph nodes: Secondary | ICD-10-CM | POA: Diagnosis present

## 2024-07-30 DIAGNOSIS — R591 Generalized enlarged lymph nodes: Secondary | ICD-10-CM

## 2024-07-30 DIAGNOSIS — N390 Urinary tract infection, site not specified: Secondary | ICD-10-CM | POA: Insufficient documentation

## 2024-07-30 DIAGNOSIS — Z800 Family history of malignant neoplasm of digestive organs: Secondary | ICD-10-CM | POA: Diagnosis not present

## 2024-07-30 DIAGNOSIS — Z8619 Personal history of other infectious and parasitic diseases: Secondary | ICD-10-CM

## 2024-07-30 DIAGNOSIS — R5382 Chronic fatigue, unspecified: Secondary | ICD-10-CM | POA: Insufficient documentation

## 2024-07-30 LAB — CBC WITH DIFFERENTIAL/PLATELET
Abs Immature Granulocytes: 0.03 K/uL (ref 0.00–0.07)
Basophils Absolute: 0.1 K/uL (ref 0.0–0.1)
Basophils Relative: 1 %
Eosinophils Absolute: 0.2 K/uL (ref 0.0–0.5)
Eosinophils Relative: 4 %
HCT: 43.2 % (ref 36.0–46.0)
Hemoglobin: 14.7 g/dL (ref 12.0–15.0)
Immature Granulocytes: 1 %
Lymphocytes Relative: 28 %
Lymphs Abs: 1.6 K/uL (ref 0.7–4.0)
MCH: 31.7 pg (ref 26.0–34.0)
MCHC: 34 g/dL (ref 30.0–36.0)
MCV: 93.3 fL (ref 80.0–100.0)
Monocytes Absolute: 0.3 K/uL (ref 0.1–1.0)
Monocytes Relative: 6 %
Neutro Abs: 3.4 K/uL (ref 1.7–7.7)
Neutrophils Relative %: 60 %
Platelets: 277 K/uL (ref 150–400)
RBC: 4.63 MIL/uL (ref 3.87–5.11)
RDW: 12.4 % (ref 11.5–15.5)
WBC: 5.6 K/uL (ref 4.0–10.5)
nRBC: 0 % (ref 0.0–0.2)

## 2024-07-30 LAB — COMPREHENSIVE METABOLIC PANEL WITH GFR
ALT: 11 U/L (ref 0–44)
AST: 21 U/L (ref 15–41)
Albumin: 4.5 g/dL (ref 3.5–5.0)
Alkaline Phosphatase: 61 U/L (ref 38–126)
Anion gap: 13 (ref 5–15)
BUN: 8 mg/dL (ref 6–20)
CO2: 24 mmol/L (ref 22–32)
Calcium: 9.1 mg/dL (ref 8.9–10.3)
Chloride: 103 mmol/L (ref 98–111)
Creatinine, Ser: 0.75 mg/dL (ref 0.44–1.00)
GFR, Estimated: >60 mL/min (ref >60)
Glucose, Bld: 44 mg/dL — CL (ref 70–99)
Potassium: 3.9 mmol/L (ref 3.5–5.1)
Sodium: 140 mmol/L (ref 135–145)
Total Bilirubin: 0.4 mg/dL (ref 0.0–1.2)
Total Protein: 7.6 g/dL (ref 6.5–8.1)

## 2024-07-30 LAB — FERRITIN: Ferritin: 80 ng/mL (ref 11–307)

## 2024-07-30 LAB — FOLATE: Folate: 10.8 ng/mL (ref >5.9)

## 2024-07-30 LAB — IRON AND TIBC
Iron: 135 ug/dL (ref 28–170)
Saturation Ratios: 35 % — ABNORMAL HIGH (ref 10.4–31.8)
TIBC: 385 ug/dL (ref 250–450)
UIBC: 250 ug/dL

## 2024-07-30 LAB — LACTATE DEHYDROGENASE: LDH: 150 U/L (ref 105–235)

## 2024-07-30 LAB — C-REACTIVE PROTEIN: CRP: <3 mg/dL — ABNORMAL HIGH (ref <1.0)

## 2024-07-30 LAB — SEDIMENTATION RATE: Sed Rate: 6 mm/h (ref 0–30)

## 2024-07-30 LAB — VITAMIN B12: Vitamin B-12: 463 pg/mL (ref 180–914)

## 2024-07-30 LAB — VITAMIN D 25 HYDROXY (VIT D DEFICIENCY, FRACTURES): Vit D, 25-Hydroxy: 33.3 ng/mL (ref 30–100)

## 2024-07-30 NOTE — Assessment & Plan Note (Addendum)
--   Will add on immunoglobulins. --Previously has had this checked which was normal.

## 2024-07-30 NOTE — Assessment & Plan Note (Addendum)
#  Lymphadenopathy: --Differentials include infectious process, inflammatory process, lymphoproliferative disorder or metastatic disease.  --Labs today to check CBC, CMP, LDH, flow cytometry, ESR and CRP levels --Left inguinal ultrasound showed several lymph nodes with normal morphology and fatty hilum noted in the left groin measuring up to 5 mm short axis.  No adenopathy no mass or fluid collection.  No hernia.  Negative exam. --RTC once workup is complete.

## 2024-07-30 NOTE — Assessment & Plan Note (Addendum)
--   Will add on nutritional labs

## 2024-07-30 NOTE — Addendum Note (Signed)
 Addended by: Quintrell Baze on: 07/30/2024 09:42 PM   Modules accepted: Level of Service

## 2024-07-30 NOTE — Progress Notes (Signed)
 CRITICAL VALUE ALERT Critical value received:  Glucose 44 Date of notification:  07-30-2024 Time of notification: 09:52 am.  Critical value read back:  Yes.   Nurse who received alert:  B.Romuald Mccaslin RN.  MD notified time and response:  J.Burns NP @ 09:56 am.

## 2024-07-30 NOTE — Progress Notes (Signed)
 Rapid Diagnostic Clinic [facility] [phone]   [phone]  INITIAL CONSULTATION:  Patient Care Team: Tobie Suzzane POUR, MD as PCP - General (Internal Medicine) Alvan, Dorn FALCON, MD as PCP - Cardiology (Cardiology) Rehman, Claudis PENNER, MD (Inactive) as Consulting Physician (Gastroenterology) Porter Andrez SAUNDERS, PA-C (Inactive) as Physician Assistant (Dermatology)  CHIEF COMPLAINTS/PURPOSE OF CONSULTATION:  Lymphadenopathy  HISTORY OF PRESENTING ILLNESS:  Theresa [name] 53 y.o. female with medical history significant for GERD, chronic rhinitis, general anxiety disorder, fibromyalgia, prediabetes, hyperlipidemia, chronic low back pain, allergy to alpha gal who was referred to rapid diagnostic clinic for lymphadenopathy.  Patient recently presented to urgent care on 07/25/2024 for groin pain and swelling on the right side which she thought was related to herpes outbreak although not improved since being on Valtrex .  Urinalysis concerning for UTI and she was started on Bactrim .  Culture grew over 80,000 colonies of group B strep.   In the interim, she had a left lower extremity soft tissue ultrasound which showed several lymph nodes with normal morphology and left groin measuring up to 5 mm.  No adenopathy no mass or fluid collection no hernia.  On exam today patient reports intermittent left sided lymphadenopathy over the years.  She reports frequent infections and is on antibiotics often.  She is currently on amoxicillin  for a UTI.  Reports chronic severe fatigue and reports sleeping a lot.  Reports intermittent joint pain that rotates.  Reports occasional night sweats where she soaks her pajamas and through her sheets.  Reports that she has to take a shower.  Denies any unintentional weight loss and or appetite changes.  Reports anxiety but is stable on Celexa  and Xanax  as needed.  Reports history of alpha gal and has received acupuncture and is now able to eat what  ever she wants.  She is interested in having her iron levels checked as she cannot recall the last time they have been checked.  Denies any bleeding, melena or hematochezia.  Reports a chronic fungal infection of her left great toe.  Reports she is followed by an allergist and has had her immunoglobulins checked in the past which were normal.  History of nausea with GLP-1 but she has not taken this in over 2 weeks.  MEDICAL HISTORY:  Past Medical History:  Diagnosis Date   Allergy    Allergy to alpha-gal    Anxiety    Colon adenoma DEC 2014   ONE(7 MM) SIMPLE([place])   Concussion with loss of consciousness 02/17/2019   Depression    Edema    GERD (gastroesophageal reflux disease)    History of abnormal cervical Pap smear 12/02/2013   History of nephrolithiasis    HPV (human papilloma virus) infection    Hyperlipidemia    IBS (irritable bowel syndrome)    constipation   Insomnia    Lymphadenopathy 07/12/2020   Obesity 01/14/2014   Positive test for Epstein-Barr virus (EBV) 08/21/2017   Pre-diabetes 09/2014   Superficial basal cell carcinoma (BCC) 12/26/2019   Right Breast- treatment Imiquimoid   Yeast infection 09/04/2013    SURGICAL HISTORY: Past Surgical History:  Procedure Laterality Date   BIOPSY N/A 08/01/2013   Procedure: BIOPSY;  Surgeon: Margo LITTIE Haddock, MD;  [facility];  Service: Endoscopy;  Laterality: N/A;  FOR MICROSCOPIC COLITIS   BIOPSY  08/09/2018   Procedure: BIOPSY;  Surgeon: Golda Claudis PENNER, MD;  [facility];  Service: Endoscopy;;  gastric   BREAST REDUCTION  SURGERY  2009   BREAST SURGERY  2007   breast reduction   CHOLECYSTECTOMY N/A 11/04/2014   Procedure: LAPAROSCOPIC CHOLECYSTECTOMY;  Surgeon: Oneil Budge Md, MD;  Location: [facility];  Service: General;  Laterality: N/A;   COLONOSCOPY  Oct 2009   Dr. Avram: int/ext hemorrhoids, ?mild proctitis but path benign, likely r/t irritation from bowel prep   COLONOSCOPY N/A 08/01/2013   Procedure:  COLONOSCOPY;  Surgeon: Margo LITTIE Haddock, MD;  [facility];  Service: Endoscopy;  Laterality: N/A;  12:00   COLONOSCOPY WITH PROPOFOL  N/A 08/09/2018   Procedure: COLONOSCOPY WITH PROPOFOL ;  Surgeon: Golda Claudis PENNER, MD;  [facility];  Service: Endoscopy;  Laterality: N/A;  730   COLONOSCOPY WITH PROPOFOL  N/A 03/15/2023   Procedure: COLONOSCOPY WITH PROPOFOL ;  Surgeon: Cindie Carlin POUR, DO;  [facility];  Service: Endoscopy;  Laterality: N/A;  10:30 am, asa 2   ESOPHAGOGASTRODUODENOSCOPY  Dec 2009   Dr. Avram: reflux esophagitis, proximal gastric polyps, benign   ESOPHAGOGASTRODUODENOSCOPY N/A 12/18/2014   Procedure: ESOPHAGOGASTRODUODENOSCOPY (EGD);  Surgeon: Claudis PENNER Golda, MD;  [facility];  Service: Endoscopy;  Laterality: N/A;  230   ESOPHAGOGASTRODUODENOSCOPY (EGD) WITH PROPOFOL  N/A 08/09/2018   Procedure: ESOPHAGOGASTRODUODENOSCOPY (EGD) WITH PROPOFOL ;  Surgeon: Golda Claudis PENNER, MD;  [facility];  Service: Endoscopy;  Laterality: N/A;   ESOPHAGOGASTRODUODENOSCOPY (EGD) WITH PROPOFOL  N/A 03/15/2023   Procedure: ESOPHAGOGASTRODUODENOSCOPY (EGD) WITH PROPOFOL ;  Surgeon: Cindie Carlin POUR, DO;  [facility];  Service: Endoscopy;  Laterality: N/A;   HEMORRHOID BANDING N/A 08/01/2013   Procedure: HEMORRHOID BANDING;  Surgeon: Margo LITTIE Haddock, MD;  [facility];  Service: Endoscopy;  Laterality: N/A;  internal hemorrhoid banding   LIVER BIOPSY N/A 11/04/2014   Procedure: LIVER BIOPSY;  Surgeon: Oneil Budge Md, MD;  [facility]: [facility];  Service: General;  Laterality: N/A;   POLYPECTOMY  2015   Dr.Fields   POLYPECTOMY  08/09/2018   Procedure: POLYPECTOMY;  Surgeon: Golda Claudis PENNER, MD;  [facility];  Service: Endoscopy;;  colon   POLYPECTOMY  03/15/2023   Procedure: POLYPECTOMY;  Surgeon: Cindie Carlin POUR, DO;  [facility];  Service: Endoscopy;;  gastric/ascending colon   REDUCTION MAMMAPLASTY  Bilateral 2006   UPPER GASTROINTESTINAL ENDOSCOPY  2006   Dr. Theodis in [place]    SOCIAL HISTORY: Social History   Socioeconomic History   Marital status: Married    Spouse name: Not on file   Number of children: 1   Years of education: Not on file   Highest education level: Associate degree: academic program  Occupational History   Occupation: Teacher, Adult Education: [place]  Tobacco Use   Smoking status: Never   Smokeless tobacco: Never   Tobacco comments:    Never smoker  Vaping Use   Vaping status: Never Used  Substance and Sexual Activity   Alcohol use: Yes    Alcohol/week: 0.0 standard drinks of alcohol    Comment: socially   Drug use: No   Sexual activity: Yes    Birth control/protection: I.U.D.  Other Topics Concern   Not on file  Social History Narrative   Not on file   Social Drivers of Health   Tobacco Use: Low Risk (07/04/2024)   Patient History    Smoking Tobacco Use: Never    Smokeless Tobacco Use: Never    Passive Exposure: Not on file  Financial Resource Strain: Low Risk (07/04/2024)  Overall Financial Resource Strain (CARDIA)    Difficulty of Paying Living Expenses: Not hard at all  Food Insecurity: No Food Insecurity (07/28/2024)   Epic    Worried About Programme Researcher, Broadcasting/film/video in the Last Year: Never true    Ran Out of Food in the Last Year: Never true  Transportation Needs: No Transportation Needs (07/28/2024)   Epic    Lack of Transportation (Medical): No    Lack of Transportation (Non-Medical): No  Physical Activity: Insufficiently Active (07/04/2024)   Exercise Vital Sign    Days of Exercise per Week: 3 days    Minutes of Exercise per Session: 20 min  Stress: Stress Concern Present (07/04/2024)   [employer] of Occupational Health - Occupational Stress Questionnaire    Feeling of Stress: Rather much  Social Connections: Moderately Integrated (07/04/2024)   Social Connection and Isolation Panel    Frequency of Communication with  Friends and Family: Three times a week    Frequency of Social Gatherings with Friends and Family: Once a week    Attends Religious Services: 1 to 4 times per year    Active Member of [employer] or Organizations: No    Attends Engineer, Structural: Not on file    Marital Status: Married  Catering Manager Violence: Not on file  Depression (PHQ2-9): Low Risk (07/30/2024)   Depression (PHQ2-9)    PHQ-2 Score: 0  Alcohol Screen: Low Risk (07/04/2024)   Alcohol Screen    Last Alcohol Screening Score (AUDIT): 5  Housing: Low Risk (07/28/2024)   Epic    Unable to Pay for Housing in the Last Year: No    Number of Times Moved in the Last Year: 0    Homeless in the Last Year: No  Utilities: Not At Risk (07/28/2024)   Epic    Threatened with loss of utilities: No  Health Literacy: Not on file    FAMILY HISTORY: Family History  Problem Relation Age of Onset   Cancer Mother        head and neck   Hypertension Father    Hyperlipidemia Father    Pancreatic cancer Maternal Grandfather    Colon cancer Neg Hx     ALLERGIES:  is allergic to alpha-gal.  MEDICATIONS:  Current Outpatient Medications  Medication Sig Dispense Refill   ALPRAZolam  (XANAX ) 1 MG tablet Take 1 tablet by mouth twice daily as needed for anxiety 60 tablet 5   ampicillin  (PRINCIPEN) 500 MG capsule Take 1 capsule (500 mg total) by mouth 4 (four) times daily. 30 capsule 0   citalopram  (CELEXA ) 40 MG tablet Take 1 tablet (40 mg total) by mouth daily. 90 tablet 3   EPINEPHrine  (EPIPEN  2-PAK) 0.3 mg/0.3 mL IJ SOAJ injection Inject 0.3 mg into the muscle as needed for anaphylaxis. 1 each 1   fluticasone  (FLONASE ) 50 MCG/ACT nasal spray Place 2 sprays into both nostrils daily. 16 g 3   ipratropium (ATROVENT ) 0.06 % nasal spray Place 2 sprays into both nostrils 3 (three) times daily. 15 mL 0   levonorgestrel  (MIRENA ) 20 MCG/24HR IUD 1 each by Intrauterine route once.     MIEBO 1.338 GM/ML SOLN      ondansetron  (ZOFRAN -ODT)  4 MG disintegrating tablet Take 1 tablet (4 mg total) by mouth every 8 (eight) hours as needed for nausea or vomiting. 20 tablet 0   pantoprazole  (PROTONIX ) 40 MG tablet Take 1 tablet (40 mg total) by mouth daily before breakfast. 90 tablet 3   polyethylene  glycol (MIRALAX  / GLYCOLAX ) 17 g packet Take 17 g by mouth daily.     Semaglutide -Weight Management (WEGOVY  [place])      valACYclovir  (VALTREX ) 1000 MG tablet Take 1 tablet (1,000 mg total) by mouth daily. 30 tablet 4   No current facility-administered medications for this visit.    REVIEW OF SYSTEMS:   Review of Systems  Constitutional:  Positive for malaise/fatigue.  Cardiovascular:  Positive for leg swelling.  Gastrointestinal:  Positive for abdominal pain and nausea.  Psychiatric/Behavioral:  The patient is nervous/anxious and has insomnia.      PHYSICAL EXAMINATION: ECOG PERFORMANCE STATUS: 1 - Symptomatic but completely ambulatory  Vitals:   07/30/24 0823  BP: 112/79  Pulse: (!) 101  Resp: 19  Temp: 98.7 F (37.1 C)  SpO2: 100%   Filed Weights   07/30/24 0823  Weight: 165 lb (74.8 kg)    Physical Exam Constitutional:      Appearance: Normal appearance.  Cardiovascular:     Rate and Rhythm: Normal rate and regular rhythm.  Pulmonary:     Effort: Pulmonary effort is normal.     Breath sounds: Normal breath sounds.  Abdominal:     General: Bowel sounds are normal.     Palpations: Abdomen is soft.  Musculoskeletal:        General: No swelling. Normal range of motion.  Lymphadenopathy:     Upper Body:     Right upper body: No supraclavicular or axillary adenopathy.     Left upper body: No supraclavicular or axillary adenopathy.     Lower Body: No right inguinal adenopathy. No left inguinal adenopathy.  Neurological:     Mental Status: She is alert and oriented to person, place, and time. Mental status is at baseline.      LABORATORY DATA:  I have reviewed the data as listed    Latest Ref Rng & Units  07/25/2024    9:37 AM 07/04/2024    9:33 AM 08/14/2023    9:17 AM  CBC  WBC 3.4 - 10.8 x10E3/uL 5.8  6.2  7.5   Hemoglobin 11.1 - 15.9 g/dL 86.0  86.6  86.4   Hematocrit 34.0 - 46.6 % 41.0  40.6  41.1   Platelets 150 - 450 x10E3/uL 242  243  287        Latest Ref Rng & Units 07/04/2024    9:33 AM 08/14/2023    9:17 AM 07/27/2022   10:14 AM  CMP  Glucose 70 - 99 mg/dL 70  79  80   BUN 6 - 24 mg/dL 8  13  9    Creatinine 0.57 - 1.00 mg/dL 9.32  9.28  9.20   Sodium 134 - 144 mmol/L 138  138  140   Potassium 3.5 - 5.2 mmol/L 3.9  4.4  3.9   Chloride 96 - 106 mmol/L 101  101  103   CO2 20 - 29 mmol/L 23  23  24    Calcium 8.7 - 10.2 mg/dL 9.0  9.0  9.2   Total Protein 6.0 - 8.5 g/dL 6.6  6.9  6.8   Total Bilirubin 0.0 - 1.2 mg/dL 0.4  0.4  0.5   Alkaline Phos 49 - 135 IU/L 56  65  76   AST 0 - 40 IU/L 15  15  14    ALT 0 - 32 IU/L 11  9  16       RADIOGRAPHIC STUDIES: I have personally reviewed the radiological images as listed and agreed  with the findings in the report. US  LT LOWER EXTREM LTD SOFT TISSUE NON VASCULAR Result Date: 07/28/2024 CLINICAL DATA:  Left inguinal lymphadenopathy. EXAM: ULTRASOUND left LOWER EXTREMITY LIMITED TECHNIQUE: Ultrasound examination of the lower extremity soft tissues was performed in the area of clinical concern. COMPARISON:  None Available. FINDINGS: Several lymph nodes with normal morphology and fatty hilum noted in the left groin measure up to 5 mm short axis. No adenopathy. No mass, or fluid collection.  No hernia noted. IMPRESSION: Negative. Electronically Signed   By: Vanetta Chou M.D.   On: 07/28/2024 12:25    ASSESSMENT & PLAN Assessment & Plan Lymphadenopathy #Lymphadenopathy: --Differentials include infectious process, inflammatory process, lymphoproliferative disorder or metastatic disease.  --Labs today to check CBC, CMP, LDH, flow cytometry, ESR and CRP levels --Left inguinal ultrasound showed several lymph nodes with normal  morphology and fatty hilum noted in the left groin measuring up to 5 mm short axis.  No adenopathy no mass or fluid collection.  No hernia.  Negative exam. --RTC once workup is complete.   Frequent infections -- Will add on immunoglobulins. --Previously has had this checked which was normal.  Chronic fatigue -- Will add on nutritional labs   Orders Placed This Encounter  Procedures   Ferritin    Standing Status:   Future    Number of Occurrences:   1    Expected Date:   07/30/2024    Expiration Date:   07/30/2025   Iron and TIBC ([facility] ONLY)    Standing Status:   Future    Number of Occurrences:   1    Expected Date:   07/30/2024    Expiration Date:   07/30/2025   Vitamin B12    Standing Status:   Future    Number of Occurrences:   1    Expected Date:   07/30/2024    Expiration Date:   07/30/2025   Folate    Standing Status:   Future    Number of Occurrences:   1    Expected Date:   07/30/2024    Expiration Date:   07/30/2025   Lactate dehydrogenase    Standing Status:   Future    Number of Occurrences:   1    Expected Date:   07/30/2024    Expiration Date:   07/30/2025   CBC with Differential    Standing Status:   Future    Number of Occurrences:   1    Expected Date:   07/30/2024    Expiration Date:   07/30/2025   Comprehensive metabolic panel    Standing Status:   Future    Number of Occurrences:   1    Expected Date:   07/30/2024    Expiration Date:   07/30/2025   Sedimentation rate    Standing Status:   Future    Number of Occurrences:   1    Expected Date:   07/30/2024    Expiration Date:   07/30/2025   C-reactive protein    Standing Status:   Future    Number of Occurrences:   1    Expected Date:   07/30/2024    Expiration Date:   07/30/2025   Copper , serum    Standing Status:   Future    Number of Occurrences:   1    Expected Date:   07/30/2024    Expiration Date:   07/30/2025   Flow Cytometry, Peripheral Blood (Oncology)  Standing Status:   Future    Number of Occurrences:   1    Expected Date:   07/30/2024    Expiration Date:   07/30/2025   ANA, IFA (with reflex)    Standing Status:   Future    Number of Occurrences:   1    Expected Date:   07/30/2024    Expiration Date:   07/30/2025   Rheumatoid factor    Standing Status:   Future    Number of Occurrences:   1    Expected Date:   07/30/2024    Expiration Date:   07/30/2025   IgG, IgA, IgM    Standing Status:   Future    Number of Occurrences:   1    Expected Date:   07/30/2024    Expiration Date:   07/30/2025   Vitamin D  25 hydroxy    Standing Status:   Future    Number of Occurrences:   1    Expected Date:   07/30/2024    Expiration Date:   07/30/2025    All questions were answered. The patient knows to call the clinic with any problems, questions or concerns.  I have spent a total of 40 minutes minutes of face-to-face and non-face-to-face time, preparing to see the patient, obtaining and/or reviewing separately obtained history, performing a medically appropriate examination, counseling and educating the patient, ordering medications/tests/procedures, referring and communicating with other health care professionals, documenting clinical information in the electronic health record, independently interpreting results and communicating results to the patient, and care coordination.   Delon Hope, AGNP-C Department of Hematology/Oncology [facility] at [facility]  Phone: [phone]

## 2024-07-30 NOTE — Patient Instructions (Signed)
 [facility]  Discharge Instructions   Rapid Diagnostic Service Visit Discharge Information and Instructions  Thank you for choosing [facility] for your healthcare needs.  Below is a summary of today's discussion, along with our contact information and an outline of what to expect next.  Reason for Visit:  lymphadenopathy  Proposed Diagnostic Care Plan: Labs today.  Follow up with Randall after labs results.  What to Expect: - Generally, when lab tests are ordered the results can take up to 1 week for results to be available.  At that point, we will contact you to discuss your results with you.  Unless there is a critical result, we will typically wait for all of your lab results to be available before contacting you. - If a biopsy is part of your Care Plan, those results can take on average 7-10 days to result.  Once results are available, we will contact you to discuss your pathology results and any next steps. - If you have additional imaging ordered, such as a CT Scan, MRI, Ultrasound, Bone Scan, or PET scan, your imaging will need to be authorized then scheduled with the earliest available appointment.  You may be asked to travel to another hospital within [facility] who has a sooner availability, please consider doing so if asked. - If you use MyChart, your results will be available to you in the MyChart portal.  Your provider will be in touch with you as soon as all of your results are available to be discussed.  Your Diagnostic Clinic Provider:  Delon Hope, NP. Your Diagnostic Navigator:  Dena Daring, RN.  Contact number [phone].   If you or your caregiver have number blocking on your cell phones, please ensure the cancer center's numbers are not blocked.  If you are not a registered MyChart user, please consider enrolling in MyChart to receive your test results and visit notes.  You can also access your discharge instructions electronically.   MyChart also gives you an electronic means to communicate with your Care Team instead of needing to call in to the cancer center.  We appreciate you trusting us  with your healthcare and look forward to partnering with you as we work to uncover what your potential diagnosis may be.  Please do not hesitate to reach out at any point with questions or concerns.      Thank you for choosing [facility] to provide your oncology and hematology care.   To afford each patient quality time with our provider, please arrive at least 15 minutes before your scheduled appointment time. You may need to reschedule your appointment if you arrive late (10 or more minutes). Arriving late affects you and other patients whose appointments are after yours.  Also, if you miss three or more appointments without notifying the office, you may be dismissed from the clinic at the providers discretion.    Again, thank you for choosing [facility].  Our hope is that these requests will decrease the amount of time that you wait before being seen by our physicians.   If you have a lab appointment with the Cancer Center - please note that after April 8th, all labs will be drawn in the cancer center.  You do not have to check in or register with the main entrance as you have in the past but will complete your check-in at the cancer center.  _____________________________________________________________  Should you have questions after your visit to [facility], please contact our office at [phone] and follow the prompts.  Our office hours are 8:00 a.m. to 4:30 p.m. Monday - Thursday and 8:00 a.m. to 2:30 p.m. Friday.  Please note that voicemails left after 4:00 p.m. may not be returned until the following business day.  We are closed weekends and all major holidays.  You do have access to a nurse 24-7, just call the main number to the clinic [phone] and do  not press any options, hold on the line and a nurse will answer the phone.    For prescription refill requests, have your pharmacy contact our office and allow 72 hours.    Masks are no longer required in the cancer centers. If you would like for your care team to wear a mask while they are taking care of you, please let them know. You may have one support person who is at least 52 years old accompany you for your appointments.

## 2024-07-31 LAB — FANA STAINING PATTERNS: Homogeneous Pattern: 1 — ABNORMAL HIGH

## 2024-07-31 LAB — COPPER, SERUM: Copper: 110 ug/dL (ref 80–158)

## 2024-07-31 LAB — SURGICAL PATHOLOGY

## 2024-07-31 LAB — FLOW CYTOMETRY

## 2024-07-31 LAB — RHEUMATOID FACTOR: Rheumatoid fact SerPl-aCnc: <10 [IU]/mL (ref <14.0)

## 2024-07-31 LAB — IGG, IGA, IGM
IgA: 268 mg/dL (ref 87–352)
IgG (Immunoglobin G), Serum: 1267 mg/dL (ref 586–1602)
IgM (Immunoglobulin M), Srm: 166 mg/dL (ref 26–217)

## 2024-07-31 LAB — ANTINUCLEAR ANTIBODIES, IFA: ANA Ab, IFA: POSITIVE — AB

## 2024-07-31 NOTE — Progress Notes (Signed)
 Rapid Diagnostic Service for Malignancy  Hand-off Note  07/31/2024 3:56 PM  Marayah Higdon [id number] [id number]  Cancer Care, Care Team: Delon Hope, NP Dena Daring, RN, Diagnostic Nurse Navigator  Michelyn Scullin was referred to Cancer Care on 07/28/24 for evaluation of: lymphadenopathy.  The patient's diagnostic work-up included: STAT inguinal ultrasound Lab work   The patient was found to not have malignancy at this time, as evaluated for the reason for referral stated above.  The recommended follow-up provided to the patient includes: follow up as needed.  We thank you for allowing us  to assist in Merrit Fedie's care.  The initial and most recent Progress Notes, labs, imaging, procedure(s), and/or consult notes have been routed to you through [facility] or faxed to your office for continuity of care.

## 2024-08-01 ENCOUNTER — Other Ambulatory Visit: Payer: Self-pay

## 2024-08-01 DIAGNOSIS — R591 Generalized enlarged lymph nodes: Secondary | ICD-10-CM

## 2024-08-01 DIAGNOSIS — R5382 Chronic fatigue, unspecified: Secondary | ICD-10-CM

## 2024-08-01 DIAGNOSIS — Z8619 Personal history of other infectious and parasitic diseases: Secondary | ICD-10-CM

## 2024-08-01 NOTE — Progress Notes (Signed)
 Lets refer to rheumatology given she has symptoms. Can you let her know and ask if she has a preference?  Delon Hope, AGNP-C Department of Hematology/Oncology [facility] at [facility]  Phone: [phone]  08/01/2024 7:41 AM

## 2024-08-05 ENCOUNTER — Other Ambulatory Visit: Payer: Self-pay | Admitting: Oncology

## 2024-08-05 ENCOUNTER — Inpatient Hospital Stay

## 2024-08-05 DIAGNOSIS — R590 Localized enlarged lymph nodes: Secondary | ICD-10-CM | POA: Diagnosis not present

## 2024-08-05 DIAGNOSIS — R5382 Chronic fatigue, unspecified: Secondary | ICD-10-CM

## 2024-08-06 LAB — RHEUMATOID FACTOR: Rheumatoid fact SerPl-aCnc: <10 [IU]/mL

## 2024-08-06 LAB — C3 AND C4
C3 Complement: 125 mg/dL (ref 82–167)
Complement C4, Body Fluid: 19 mg/dL (ref 12–38)

## 2024-08-06 LAB — CYCLIC CITRUL PEPTIDE ANTIBODY, IGG/IGA: CCP Antibodies IgG/IgA: <1 U (ref 0–19)

## 2024-08-06 LAB — ANTI-SCLERODERMA ANTIBODY: Scleroderma (Scl-70) (ENA) Antibody, IgG: <0.2 AI (ref 0.0–0.9)

## 2024-08-06 LAB — CENTROMERE ANTIBODIES: Centromere Ab Screen: <0.2 AI (ref 0.0–0.9)

## 2024-08-06 LAB — ANTI-SMITH ANTIBODY: ENA SM Ab Ser-aCnc: <0.2 AI (ref 0.0–0.9)

## 2024-08-11 ENCOUNTER — Inpatient Hospital Stay

## 2024-08-11 ENCOUNTER — Encounter: Payer: Self-pay | Admitting: *Deleted

## 2024-08-11 ENCOUNTER — Other Ambulatory Visit: Payer: Self-pay | Admitting: Oncology

## 2024-08-11 DIAGNOSIS — R5382 Chronic fatigue, unspecified: Secondary | ICD-10-CM

## 2024-08-11 DIAGNOSIS — R590 Localized enlarged lymph nodes: Secondary | ICD-10-CM | POA: Diagnosis not present

## 2024-08-12 ENCOUNTER — Telehealth: Payer: Self-pay

## 2024-08-12 NOTE — Telephone Encounter (Signed)
 NOTED

## 2024-08-12 NOTE — Telephone Encounter (Signed)
 Copied from CRM [id number]. Topic: General - Other >> Aug 12, 2024  9:31 AM Tinnie BROCKS wrote: Reason for CRM: FYI- Pt will be faxing FMLA paperwork over today from [employer]. Fax # provided. Please be on the lookout.

## 2024-08-13 ENCOUNTER — Ambulatory Visit: Payer: Self-pay | Admitting: Oncology

## 2024-08-13 LAB — EPSTEIN-BARR VIRUS (EBV) ANTIBODY PROFILE
EBV NA IgG: 109 U/mL — ABNORMAL HIGH (ref 0.0–17.9)
EBV VCA IgG: 326 U/mL — ABNORMAL HIGH (ref 0.0–17.9)
EBV VCA IgM: >160 U/mL — ABNORMAL HIGH (ref 0.0–35.9)

## 2024-08-21 ENCOUNTER — Other Ambulatory Visit: Payer: Self-pay

## 2024-08-21 ENCOUNTER — Inpatient Hospital Stay: Attending: Oncology

## 2024-08-21 ENCOUNTER — Other Ambulatory Visit: Payer: Self-pay | Admitting: Oncology

## 2024-08-21 DIAGNOSIS — B2790 Infectious mononucleosis, unspecified without complication: Secondary | ICD-10-CM

## 2024-08-21 DIAGNOSIS — R5382 Chronic fatigue, unspecified: Secondary | ICD-10-CM

## 2024-08-21 DIAGNOSIS — R590 Localized enlarged lymph nodes: Secondary | ICD-10-CM | POA: Insufficient documentation

## 2024-08-23 LAB — EPSTEIN BARR VRS(EBV DNA BY PCR): EBV DNA QN by PCR: NEGATIVE [IU]/mL

## 2024-09-01 ENCOUNTER — Telehealth: Admitting: Family Medicine

## 2024-09-01 DIAGNOSIS — N3090 Cystitis, unspecified without hematuria: Secondary | ICD-10-CM | POA: Diagnosis not present

## 2024-09-01 MED ORDER — SULFAMETHOXAZOLE-TRIMETHOPRIM 800-160 MG PO TABS: 1.0000 | ORAL_TABLET | Freq: Two times a day (BID) | ORAL | 0 refills | Status: AC

## 2024-09-01 NOTE — Assessment & Plan Note (Signed)
 Urinalysis is positive for leukocytes and negative for nitrites. Will treat empirically with Bactrim  for 5 days. Urine culture pending. Will defer referral to urology at this time, per patient preference. UTI prevention strategies were discussed, including: Increasing fluid intake Avoiding holding urine for prolonged periods Wiping front to back Urinating after sexual activity Avoiding scented feminine products

## 2024-09-01 NOTE — Progress Notes (Signed)
 "  Virtual Visit via Video Note  I connected with Theresa [name] on 09/01/24 at  1:40 PM EST by a video enabled telemedicine application and verified that I am speaking with the correct person using two identifiers.  Patient Location: Home Provider Location: Home Office  I discussed the limitations, risks, security, and privacy concerns of performing an evaluation and management service by video and the availability of in person appointments. I also discussed with the patient that there may be a patient responsible charge related to this service. The patient expressed understanding and agreed to proceed.  Subjective: PCP: Tobie Suzzane POUR, MD  Chief Complaint  Patient presents with   Urinary Tract Infection   HPI The patient reports generalized malaise and pressure-like pain in the urethral area. She states that she was treated twice for a urinary tract infection ( 07/25/2024 and 08/19/2024) , despite being largely asymptomatic at the time. The patient reports that she is currently under the care of multiple specialists and does not wish to see an additional specialist at this time.  She has a history of chronic fatigue, Epstein-Barr virus infection, and lymphadenopathy.     ROS: Per HPI Current Medications[1]  Observations/Objective: There were no vitals filed for this visit. Physical Exam Patient is well-developed, well-nourished in no acute distress.  Resting comfortably at home.  Head is normocephalic, atraumatic.  No labored breathing.  Speech is clear and coherent with logical content.  Patient is alert and oriented at baseline.   Assessment and Plan: Cystitis Assessment & Plan: Urinalysis is positive for leukocytes and negative for nitrites. Will treat empirically with Bactrim  for 5 days. Urine culture pending. Will defer referral to urology at this time, per patient preference. UTI prevention strategies were discussed, including: Increasing fluid intake Avoiding holding  urine for prolonged periods Wiping front to back Urinating after sexual activity Avoiding scented feminine products   Orders: -     Urinalysis -     Urine Culture -     Sulfamethoxazole -Trimethoprim ; Take 1 tablet by mouth 2 (two) times daily for 5 days.  Dispense: 10 tablet; Refill: 0    Follow Up Instructions: No follow-ups on file.   I discussed the assessment and treatment plan with the patient. The patient was provided an opportunity to ask questions, and all were answered. The patient agreed with the plan and demonstrated an understanding of the instructions.   The patient was advised to call back or seek an in-person evaluation if the symptoms worsen or if the condition fails to improve as anticipated.  The above assessment and management plan was discussed with the patient. The patient verbalized understanding of and has agreed to the management plan.   Meade JENEANE Gerlach, FNP     [1]  Current Outpatient Medications:    sulfamethoxazole -trimethoprim  (BACTRIM  DS) 800-160 MG tablet, Take 1 tablet by mouth 2 (two) times daily for 5 days., Disp: 10 tablet, Rfl: 0   ALPRAZolam  (XANAX ) 1 MG tablet, Take 1 tablet by mouth twice daily as needed for anxiety, Disp: 60 tablet, Rfl: 5   ampicillin  (PRINCIPEN) 500 MG capsule, Take 1 capsule (500 mg total) by mouth 4 (four) times daily., Disp: 30 capsule, Rfl: 0   citalopram  (CELEXA ) 40 MG tablet, Take 1 tablet (40 mg total) by mouth daily., Disp: 90 tablet, Rfl: 3   EPINEPHrine  (EPIPEN  2-PAK) 0.3 mg/0.3 mL IJ SOAJ injection, Inject 0.3 mg into the muscle as needed for anaphylaxis., Disp: 1 each, Rfl: 1  fluticasone  (FLONASE ) 50 MCG/ACT nasal spray, Place 2 sprays into both nostrils daily., Disp: 16 g, Rfl: 3   ipratropium (ATROVENT ) 0.06 % nasal spray, Place 2 sprays into both nostrils 3 (three) times daily., Disp: 15 mL, Rfl: 0   levonorgestrel  (MIRENA ) 20 MCG/24HR IUD, 1 each by Intrauterine route once., Disp: , Rfl:    MIEBO 1.338  GM/ML SOLN, , Disp: , Rfl:    ondansetron  (ZOFRAN -ODT) 4 MG disintegrating tablet, Take 1 tablet (4 mg total) by mouth every 8 (eight) hours as needed for nausea or vomiting., Disp: 20 tablet, Rfl: 0   pantoprazole  (PROTONIX ) 40 MG tablet, Take 1 tablet (40 mg total) by mouth daily before breakfast., Disp: 90 tablet, Rfl: 3   polyethylene glycol (MIRALAX  / GLYCOLAX ) 17 g packet, Take 17 g by mouth daily., Disp: , Rfl:    Semaglutide -Weight Management (WEGOVY  [place]), , Disp: , Rfl:    valACYclovir  (VALTREX ) 1000 MG tablet, Take 1 tablet (1,000 mg total) by mouth daily., Disp: 30 tablet, Rfl: 4  "

## 2024-09-02 ENCOUNTER — Inpatient Hospital Stay

## 2024-09-02 ENCOUNTER — Other Ambulatory Visit: Payer: Self-pay | Admitting: Oncology

## 2024-09-02 DIAGNOSIS — B2790 Infectious mononucleosis, unspecified without complication: Secondary | ICD-10-CM

## 2024-09-02 DIAGNOSIS — R590 Localized enlarged lymph nodes: Secondary | ICD-10-CM | POA: Diagnosis not present

## 2024-09-02 DIAGNOSIS — E3400 Carcinoid syndrome, unspecified: Secondary | ICD-10-CM

## 2024-09-02 NOTE — Progress Notes (Signed)
 Oncology Progress Note  Patient reports continued symptoms of fatigue, night sweats and hot flashes. Previously had elevated chromogranin A with negative DOTATATE PET in 2021.  Will reorder work up today with chromogranin A and urine 5 HIAA. Will follow up on this.

## 2024-09-03 ENCOUNTER — Ambulatory Visit: Admitting: Infectious Diseases

## 2024-09-03 LAB — URINE CULTURE

## 2024-09-04 ENCOUNTER — Other Ambulatory Visit: Payer: Self-pay

## 2024-09-04 DIAGNOSIS — E3400 Carcinoid syndrome, unspecified: Secondary | ICD-10-CM

## 2024-09-04 LAB — CHROMOGRANIN A: Chromogranin A (ng/mL): 722.5 ng/mL — ABNORMAL HIGH (ref 0.0–101.8)

## 2024-09-06 ENCOUNTER — Other Ambulatory Visit: Payer: Self-pay | Admitting: Internal Medicine

## 2024-09-06 DIAGNOSIS — F411 Generalized anxiety disorder: Secondary | ICD-10-CM

## 2024-09-12 ENCOUNTER — Ambulatory Visit (INDEPENDENT_AMBULATORY_CARE_PROVIDER_SITE_OTHER): Admitting: Internal Medicine

## 2024-09-12 ENCOUNTER — Other Ambulatory Visit: Payer: Self-pay

## 2024-09-12 ENCOUNTER — Encounter: Payer: Self-pay | Admitting: Internal Medicine

## 2024-09-12 VITALS — BP 131/86 | HR 88 | Temp 97.5°F | Ht 66.0 in | Wt 178.0 lb

## 2024-09-12 DIAGNOSIS — R899 Unspecified abnormal finding in specimens from other organs, systems and tissues: Secondary | ICD-10-CM

## 2024-09-12 DIAGNOSIS — E162 Hypoglycemia, unspecified: Secondary | ICD-10-CM

## 2024-09-12 NOTE — Patient Instructions (Signed)
 You don't have ebv. I would advise not checking this again any time   ?recent covid/viral syndrome causing all this sx. I am not sure ?hsv recurrent as you think? Try 3 months acyclovir and trial off if sx all quiescent   Symptoms of hypoglycemia and also elevated chromogenin. Referral to endocrine placed

## 2024-09-12 NOTE — Progress Notes (Signed)
 "       [facility]  Reason for Consult:positive ebv serology Referring Provider: Davonna Siad    Patient Active Problem List   Diagnosis Date Noted   Cystitis 09/01/2024   LAD (lymphadenopathy), inguinal 07/28/2024   Heart palpitations 12/28/2023   Encounter for general adult medical examination with abnormal findings 08/14/2023   Skin lesion of chest wall 08/14/2023   Hepatic steatosis determined by biopsy of liver 06/22/2023   Onychomycosis 04/26/2023   Grade II hemorrhoids 04/19/2023   Dysphagia 03/02/2023   Hemorrhoidal skin tags 03/02/2023   DDD (degenerative disc disease), thoracolumbar 12/08/2022   Acute recurrent frontal sinusitis 07/27/2022   Mixed hyperlipidemia 11/24/2021   Allergy to alpha-gal 08/01/2021   Seasonal allergic conjunctivitis 08/01/2021   Seasonal and perennial allergic rhinitis 08/01/2021   Fibromyalgia 05/20/2021   Lymphadenopathy 07/12/2020   Prediabetes 08/25/2019   Separation of right acromioclavicular joint 02/17/2019   Idiopathic anaphylaxis 02/12/2019   Chronic rhinitis 02/12/2019   Chronic idiopathic urticaria 02/12/2019   Hx of colonic polyps 07/17/2018   Chronic fatigue 07/12/2015   Arthralgia 07/12/2015   Obesity 01/14/2014   History of abnormal cervical Pap smear 12/02/2013   Frequent infections 09/04/2013   Internal hemorrhoids with other complication 08/21/2013   Generalized anxiety disorder 12/25/2012   Gastroesophageal reflux disease 10/31/2011   Irritable bowel syndrome with constipation 05/13/2008      HPI: Theresa [name] is a 54 y.o. 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 Dx'ed fibromyalgia. Ana only mildly positive. To be seeing ent for salivary gland biopsy concerned for sjogren   Recent sx: Migrating petechiae 2-3 weeks ago resolved. She did take a few days  of abx 2-3 weeks prior to that Last several weeks did feel fatigue and felt axillary/inguinal node swelling She took valtrex  and sx feel better in a few days. She still takes it once a day at this time   She also described feeling jittery/shakes for since her teenage years, last few months 5 episodes. There were also labs that showed plasma glucose in the 40s in the last few months   She does describe episodic facial flushing. But no wheezing. No recurrent chest/abd pain. No particular increased diarrhea with it. Does have more frequent random diarrhea but on miralax    No history of gastric bypass surgery  No prior endocrine evaluation  Oncology is seeing for lymphadenopathy and also recently petechiae; she had an incidental 2019 level chromogenin level. Since then a level was repeated by oncology and still high. She had a PET scan for NET in 2021 was negtive   Cbc/cmp and esr/crp have been normal Ebv pcr 08/21/24 negative   She had 07/28/24 left groin u/s that showed no adenopathy  Of note, she had confirmed covid infection 05/2024 last week   Review of Systems: ROS All sx negative unless otherwise negative      Past Medical History:  Diagnosis Date   Allergy    Allergy to alpha-gal    Anxiety    Colon adenoma DEC 2014   ONE(7 MM) SIMPLE([place])   Concussion with loss of consciousness 02/17/2019   Depression    Edema    GERD (gastroesophageal reflux disease)    History of abnormal cervical Pap smear 12/02/2013   History of nephrolithiasis    HPV (human papilloma virus) infection    Hyperlipidemia  IBS (irritable bowel syndrome)    constipation   Insomnia    Lymphadenopathy 07/12/2020   Obesity 01/14/2014   Positive test for Epstein-Barr virus (EBV) 08/21/2017   Pre-diabetes 09/2014   Superficial basal cell carcinoma (BCC) 12/26/2019   Right Breast- treatment Imiquimoid   Yeast infection 09/04/2013    Social History[1]  Family History  Problem Relation Age of  Onset   Cancer Mother        head and neck   Hypertension Father    Hyperlipidemia Father    Pancreatic cancer Maternal Grandfather    Colon cancer Neg Hx     Allergies[2]  OBJECTIVE: Vitals:   09/12/24 0903  BP: 131/86  Pulse: 88  Temp: (!) 97.5 F (36.4 C)  TempSrc: Oral  SpO2: 100%  Weight: 178 lb (80.7 kg)  Height: 5' 6 (1.676 m)   Body mass index is 28.73 kg/m.   Physical Exam General/constitutional: no distress, pleasant HEENT: Normocephalic, PER, Conj Clear, EOMI, Oropharynx clear Neck supple CV: rrr no mrg Lungs: clear to auscultation, normal respiratory effort Abd: Soft, Nontender Ext: no edema Skin: No Rash Neuro: nonfocal MSK: no peripheral joint swelling/tenderness/warmth; back spines nontender   Lab: Lab Results  Component Value Date   WBC 5.6 07/30/2024   HGB 14.7 07/30/2024   HCT 43.2 07/30/2024   MCV 93.3 07/30/2024   PLT 277 07/30/2024   Last metabolic panel Lab Results  Component Value Date   GLUCOSE 44 (LL) 07/30/2024   NA 140 07/30/2024   K 3.9 07/30/2024   CL 103 07/30/2024   CO2 24 07/30/2024   BUN 8 07/30/2024   CREATININE 0.75 07/30/2024   GFRNONAA >60 07/30/2024   CALCIUM 9.1 07/30/2024   PROT 7.6 07/30/2024   ALBUMIN 4.5 07/30/2024   LABGLOB 2.5 07/04/2024   AGRATIO 1.7 07/27/2022   BILITOT 0.4 07/30/2024   ALKPHOS 61 07/30/2024   AST 21 07/30/2024   ALT 11 07/30/2024   ANIONGAP 13 07/30/2024    Microbiology:  Serology:  Imaging:   Assessment/plan: Problem List Items Addressed This Visit   None Visit Diagnoses       Abnormal laboratory test    -  Primary         #positive atypical ebv serology Chronically elevated ebv capside igm Prior evaluation in 2016 also id evaluation with dr Elaine Ebv pcr negative 08/21/24  She doesn't have acute/reactivation/chronic ebv syndrome  Ebv capsid igm likely false positive  Reassurance. I woudn't check ebv stuff in her again for any reason   #periods of  shakiness/low plasma glucose Wegovy  used in past but stopped long before glucose level discovered low Shakiness/hypoglycemic sx occurred intemrittently chronically  Would benefit from endocrine evalution --referral placed   #elevated chromogenin level Unclear significance and I am not sure what to make of it Some intermittent flushing lately. No wheezing. No concomittant diarrhea but takes miralax  and have random episode of diarrhea  Endocrine perhaps can also shed light on this    #appear to have recent viral syndrome with transient macular vs petechiae rash Some abx 2-3 weeks prior to rash Appear to resolve Patient takes valtrex  thinking this could be hsv and temporaly sx improve on valtrex . She thinks she has had episodes before and worry it could be recurrnt hsv While doesn't appear to be hsv I think reasonable to take if it helps her and in 3 months if no sx can trial off  Covid late December ?setting all of this off  Follow-up: No follow-ups on file.  Theresa ONEIDA Passer, MD [facility] 09/12/2024, 9:18 AM     [1]  Social History Tobacco Use   Smoking status: Never   Smokeless tobacco: Never   Tobacco comments:    Never smoker  Vaping Use   Vaping status: Never Used  Substance Use Topics   Alcohol use: Yes    Alcohol/week: 0.0 standard drinks of alcohol    Comment: socially   Drug use: No  [2]  Allergies Allergen Reactions   Alpha-Gal Anaphylaxis, Nausea And Vomiting and Shortness Of Breath   "

## 2024-09-16 ENCOUNTER — Other Ambulatory Visit: Payer: Self-pay

## 2024-09-16 ENCOUNTER — Encounter: Payer: Self-pay | Admitting: Internal Medicine

## 2024-09-16 ENCOUNTER — Other Ambulatory Visit (HOSPITAL_COMMUNITY)
Admission: RE | Admit: 2024-09-16 | Discharge: 2024-09-16 | Disposition: A | Source: Ambulatory Visit | Attending: Internal Medicine | Admitting: Internal Medicine

## 2024-09-16 MED ORDER — FREESTYLE LIBRE 3 PLUS SENSOR MISC: 0 refills | Status: AC

## 2024-09-18 ENCOUNTER — Encounter: Payer: Self-pay | Admitting: Internal Medicine

## 2024-09-18 ENCOUNTER — Other Ambulatory Visit: Payer: Self-pay | Admitting: Internal Medicine

## 2024-09-18 DIAGNOSIS — M7989 Other specified soft tissue disorders: Secondary | ICD-10-CM

## 2024-09-18 MED ORDER — FUROSEMIDE 20 MG PO TABS: 20.0000 mg | ORAL_TABLET | Freq: Every day | ORAL | 1 refills | Status: AC | PRN

## 2024-09-19 LAB — 5 HIAA, QUANTITATIVE, URINE, 24 HOUR
5-HIAA, Ur: 1.5 mg/L
5-HIAA,Quant.,24 Hr Urine: 3.9 mg/(24.h) (ref 0.0–14.9)
Total Volume: 2570

## 2024-10-03 ENCOUNTER — Institutional Professional Consult (permissible substitution) (INDEPENDENT_AMBULATORY_CARE_PROVIDER_SITE_OTHER)

## 2024-11-07 ENCOUNTER — Ambulatory Visit: Admitting: Internal Medicine
